# Patient Record
Sex: Male | Born: 1945
Health system: Southern US, Community
[De-identification: ages and names within clinical notes are randomized; demographics above are authoritative.]

## PROBLEM LIST (undated history)

## (undated) DIAGNOSIS — E119 Type 2 diabetes mellitus without complications: Secondary | ICD-10-CM

## (undated) DIAGNOSIS — I10 Essential (primary) hypertension: Secondary | ICD-10-CM

## (undated) DIAGNOSIS — M1990 Unspecified osteoarthritis, unspecified site: Secondary | ICD-10-CM

## (undated) DIAGNOSIS — C801 Malignant (primary) neoplasm, unspecified: Secondary | ICD-10-CM

## (undated) HISTORY — PX: JOINT REPLACEMENT: SHX530

## (undated) HISTORY — DX: Type 2 diabetes mellitus without complications: E11.9

## (undated) HISTORY — DX: Unspecified osteoarthritis, unspecified site: M19.90

## (undated) HISTORY — PX: LIVER BIOPSY: SHX301

## (undated) HISTORY — PX: HERNIA REPAIR: SHX51

---

## 1997-09-08 ENCOUNTER — Ambulatory Visit (HOSPITAL_BASED_OUTPATIENT_CLINIC_OR_DEPARTMENT_OTHER): Admission: RE | Admit: 1997-09-08 | Discharge: 1997-09-08 | Payer: Self-pay | Admitting: Orthopedic Surgery

## 2000-07-03 ENCOUNTER — Inpatient Hospital Stay (HOSPITAL_COMMUNITY): Admission: RE | Admit: 2000-07-03 | Discharge: 2000-07-09 | Payer: Self-pay | Admitting: Orthopedic Surgery

## 2000-07-08 ENCOUNTER — Encounter: Payer: Self-pay | Admitting: Orthopedic Surgery

## 2001-06-12 ENCOUNTER — Emergency Department (HOSPITAL_COMMUNITY): Admission: EM | Admit: 2001-06-12 | Discharge: 2001-06-12 | Payer: Self-pay | Admitting: Emergency Medicine

## 2003-08-04 ENCOUNTER — Emergency Department (HOSPITAL_COMMUNITY): Admission: EM | Admit: 2003-08-04 | Discharge: 2003-08-04 | Payer: Self-pay | Admitting: Emergency Medicine

## 2003-08-05 ENCOUNTER — Ambulatory Visit (HOSPITAL_COMMUNITY): Admission: RE | Admit: 2003-08-05 | Discharge: 2003-08-05 | Payer: Self-pay | Admitting: Internal Medicine

## 2003-08-12 ENCOUNTER — Ambulatory Visit (HOSPITAL_COMMUNITY): Admission: RE | Admit: 2003-08-12 | Discharge: 2003-08-12 | Payer: Self-pay | Admitting: Internal Medicine

## 2005-05-30 ENCOUNTER — Ambulatory Visit (HOSPITAL_COMMUNITY): Admission: RE | Admit: 2005-05-30 | Discharge: 2005-05-30 | Payer: Self-pay | Admitting: Internal Medicine

## 2008-09-22 ENCOUNTER — Ambulatory Visit (HOSPITAL_COMMUNITY): Admission: RE | Admit: 2008-09-22 | Discharge: 2008-09-22 | Payer: Self-pay | Admitting: Family Medicine

## 2008-09-22 ENCOUNTER — Ambulatory Visit: Payer: Self-pay | Admitting: Vascular Surgery

## 2008-09-22 ENCOUNTER — Encounter (INDEPENDENT_AMBULATORY_CARE_PROVIDER_SITE_OTHER): Payer: Self-pay | Admitting: Family Medicine

## 2008-09-30 ENCOUNTER — Emergency Department (HOSPITAL_COMMUNITY): Admission: EM | Admit: 2008-09-30 | Discharge: 2008-09-30 | Payer: Self-pay | Admitting: Emergency Medicine

## 2008-10-02 ENCOUNTER — Inpatient Hospital Stay (HOSPITAL_COMMUNITY): Admission: EM | Admit: 2008-10-02 | Discharge: 2008-10-05 | Payer: Self-pay | Admitting: Emergency Medicine

## 2008-10-02 ENCOUNTER — Ambulatory Visit: Payer: Self-pay | Admitting: Internal Medicine

## 2008-10-04 ENCOUNTER — Encounter (INDEPENDENT_AMBULATORY_CARE_PROVIDER_SITE_OTHER): Payer: Self-pay | Admitting: Internal Medicine

## 2010-07-24 LAB — COMPREHENSIVE METABOLIC PANEL
AST: 17 U/L (ref 0–37)
Albumin: 2.9 g/dL — ABNORMAL LOW (ref 3.5–5.2)
BUN: 13 mg/dL (ref 6–23)
Calcium: 8.3 mg/dL — ABNORMAL LOW (ref 8.4–10.5)
Creatinine, Ser: 0.91 mg/dL (ref 0.4–1.5)
GFR calc Af Amer: >60 mL/min (ref >60)
GFR calc non Af Amer: >60 mL/min (ref >60)
Total Bilirubin: 0.5 mg/dL (ref 0.3–1.2)

## 2010-07-24 LAB — PROTIME-INR
INR: 1.1 (ref 0.00–1.49)
Prothrombin Time: 14.2 seconds (ref 11.6–15.2)

## 2010-07-24 LAB — POCT CARDIAC MARKERS: Troponin i, poc: <0.05 ng/mL (ref 0.00–0.09)

## 2010-07-24 LAB — CBC
HCT: 39.3 % (ref 39.0–52.0)
Hemoglobin: 13.2 g/dL (ref 13.0–17.0)
Hemoglobin: 16.3 g/dL (ref 13.0–17.0)
MCHC: 34.7 g/dL (ref 30.0–36.0)
MCV: 93.8 fL (ref 78.0–100.0)
Platelets: 158 10*3/uL (ref 150–400)
Platelets: 161 10*3/uL (ref 150–400)
RBC: 4 MIL/uL — ABNORMAL LOW (ref 4.22–5.81)
RDW: 12.8 % (ref 11.5–15.5)
WBC: 10.9 10*3/uL — ABNORMAL HIGH (ref 4.0–10.5)
WBC: 7.6 10*3/uL (ref 4.0–10.5)

## 2010-07-24 LAB — DIFFERENTIAL
Basophils Absolute: 0 10*3/uL (ref 0.0–0.1)
Lymphocytes Relative: 6 % — ABNORMAL LOW (ref 12–46)
Lymphs Abs: 0.6 10*3/uL — ABNORMAL LOW (ref 0.7–4.0)
Neutro Abs: 10 10*3/uL — ABNORMAL HIGH (ref 1.7–7.7)

## 2010-07-24 LAB — TROPONIN I
Troponin I: 0.01 ng/mL (ref 0.00–0.06)
Troponin I: 0.02 ng/mL (ref 0.00–0.06)

## 2010-07-24 LAB — GLUCOSE, CAPILLARY
Glucose-Capillary: 121 mg/dL — ABNORMAL HIGH (ref 70–99)
Glucose-Capillary: 126 mg/dL — ABNORMAL HIGH (ref 70–99)
Glucose-Capillary: 141 mg/dL — ABNORMAL HIGH (ref 70–99)
Glucose-Capillary: 149 mg/dL — ABNORMAL HIGH (ref 70–99)
Glucose-Capillary: 166 mg/dL — ABNORMAL HIGH (ref 70–99)
Glucose-Capillary: 177 mg/dL — ABNORMAL HIGH (ref 70–99)
Glucose-Capillary: 233 mg/dL — ABNORMAL HIGH (ref 70–99)

## 2010-07-24 LAB — BASIC METABOLIC PANEL
CO2: 30 mEq/L (ref 19–32)
Calcium: 8.5 mg/dL (ref 8.4–10.5)
Calcium: 8.6 mg/dL (ref 8.4–10.5)
Chloride: 104 mEq/L (ref 96–112)
Creatinine, Ser: 1.17 mg/dL (ref 0.4–1.5)
GFR calc Af Amer: >60 mL/min (ref >60)
GFR calc Af Amer: >60 mL/min (ref >60)
GFR calc non Af Amer: >60 mL/min (ref >60)
Glucose, Bld: 259 mg/dL — ABNORMAL HIGH (ref 70–99)
Potassium: 4.3 mEq/L (ref 3.5–5.1)
Sodium: 138 mEq/L (ref 135–145)
Sodium: 138 mEq/L (ref 135–145)

## 2010-07-24 LAB — CARDIAC PANEL(CRET KIN+CKTOT+MB+TROPI)
CK, MB: 2.5 ng/mL (ref 0.3–4.0)
Relative Index: 0.6 (ref 0.0–2.5)
Total CK: 459 U/L — ABNORMAL HIGH (ref 7–232)
Total CK: 485 U/L — ABNORMAL HIGH (ref 7–232)
Troponin I: 0.02 ng/mL (ref 0.00–0.06)
Troponin I: 0.04 ng/mL (ref 0.00–0.06)

## 2010-07-24 LAB — CK TOTAL AND CKMB (NOT AT ARMC)
CK, MB: 1.7 ng/mL (ref 0.3–4.0)
CK, MB: 2.3 ng/mL (ref 0.3–4.0)
Relative Index: 1.2 (ref 0.0–2.5)
Total CK: 229 U/L (ref 7–232)
Total CK: 243 U/L — ABNORMAL HIGH (ref 7–232)

## 2010-07-24 LAB — POCT I-STAT, CHEM 8
Chloride: 102 mEq/L (ref 96–112)
HCT: 46 % (ref 39.0–52.0)
Hemoglobin: 15.6 g/dL (ref 13.0–17.0)
Potassium: 4.4 mEq/L (ref 3.5–5.1)
Sodium: 134 mEq/L — ABNORMAL LOW (ref 135–145)

## 2010-07-24 LAB — HEMOGLOBIN A1C: Hgb A1c MFr Bld: 5.8 % (ref 4.6–6.1)

## 2010-08-29 NOTE — Discharge Summary (Signed)
NAME:  Robert Compton, Robert Compton              ACCOUNT NO.:  [id number]   MEDICAL RECORD NO.:  [id number]          PATIENT TYPE:  INP   LOCATION:  1437                         FACILITY:  [facility]   PHYSICIAN:  Isidor Holts, M.D.  DATE OF BIRTH:  08-08-1945   DATE OF ADMISSION:  10/02/2008  DATE OF DISCHARGE:  10/05/2008                               DISCHARGE SUMMARY   PRIMARY CARE PHYSICIAN:  Dr. Felipa Eth.   DISCHARGE DIAGNOSIS:  1. Anaphylactic reaction, secondary to Augmentin.  2. Status post recent dog bite.  3. Hypertension.  4. Type 2 diabetes mellitus.  5. Smoking history.  6. New onset atrial fibrillation.   DISCHARGE MEDICATIONS:  1. Aspirin 81 mg p.o. daily.  2. Altace 5 mg p.o. daily.  3. Metformin 1000 mg p.o. b.i.d.  4. Fish oil 1000 mg p.o. b.i.d.  5. Claritin 10 mg p.o. daily for one week only.  6. Pepcid 20 mg p.o. b.i.d. for one week only.  7. Prednisone 30 mg p.o. daily for two days, then 20 mg p.o. daily for      two days, then 10 mg p.o. daily for two days, then stop.  8. Coumadin per INR, started on 5 mg p.o. at 6 p.m. daily.   Note:  The patient has stated that he does not want to take Coumadin.  Prescription however, was supplied, in case he changes his mind.   PROCEDURES:  2-D echocardiogram done October 04, 2008 that shows normal  left ventricular cavity size, normal systolic function, ejection  fraction 50% to 55%.  The valves were normal.  Images were inadequate  for left ventricular wall motion assessment.   CONSULTATIONS:  Dr. Kristeen Miss, cardiologist.   ADMISSION HISTORY:  As in H and P notes of October 02, 2008, dictated by  Dr. Joylene John.  However, in brief, this is a 65 year old male, with  known history of type 2 diabetes mellitus, smoking history, and  hypertension, who presented with shortness of breath and hives.  Apparently, he had recently sustained a dog bite and was commenced on  Augmentin.  After taking about four to five days of this  medication, he  developed pruritus and hives which were generalized, associated with  difficulty breathing and swelling in the back of the throat.  He  presented to the emergency department in [facility], was  treated with Solu-Medrol and Benadryl and then discharged with  improvement.  However, symptoms recurred in the a.m. of October 02, 2008  and the patient was referred by his primary physician to the emergency  department.  He was subsequently admitted on the critical care medicine  service, and commenced on intravenous steroids, intravenous H2 receptor  antagonist, and antihistaminics. Following clinical stability, he was  transferred to the medical service on October 03, 2008.   CLINICAL COURSE:  1. Anaphylactic reaction to Augmentin.  For details of presentation,      refer to admission history above.  The patient did have generalized      pruritic/urticarial rash and had lower lip angioedema.  He had no      respiratory  compromise during the course of his hospitalization,      and did respond to parenteral steroids, antihistaminics, H2 RA, as      well as intravenous fluid hydration.  By October 04, 2008, we were      able to transition him to oral steroids, antihistaminics, and H2      receptor antagonist, and by October 05, 2008, the patient was      completely asymptomatic and very keen to be discharged.  He is      recommended to complete another week of these medications.   1. Hypertension.  This remained controlled during the course of the      patient's hospitalization.  His ACE inhibitor was held secondary to      borderline low blood pressure at the time of presentation, however,      as of October 05, 2008, blood pressure was 122/72.  The patient will      continue his ACE inhibitor following discharge.   1. Type 2 diabetes mellitus.  This remained controlled throughout the      course of the patient's hospitalization.  He continued on his  pre-      admission oral  hypoglycemics.   1. Smoking history.  The patient has been counseled appropriately.   1. Atrial fibrillation.  This is a new diagnosis.  The patient was      initially noted to have sinus bradycardia in the 30s and 40s on      October 04, 2008, on telemetry.  He subsequently reverted to rate      controlled atrial fibrillation.  He does not have any previous      cardiac history.  A 2-D echocardiogram was done.  For details of      findings, refer to procedure list above.  This was done on October 04, 2008, and was essentially a normal 2-D echocardiogram.  Cardiac      enzymes were cycled and remained unelevated.  The patient had no      symptoms of chest pain.  TSH was normal at 0.667.  Cardiology      consultation was kindly provided by Dr. Kristeen Miss.  For details      of that consultation, refer to consultation notes of October 04, 2008.      He has recommended anticoagulation and anticipates monitoring this      in his office, on an outpatient basis.   DISPOSITION:  The patient was asymptomatic and considered clinically  stable for discharge on October 05, 2008.  He has been discharged  accordingly.  As noted above, the patient has declined anticoagulation.  We have, however, presented him with a prescription which was supplied  by Dr. Kristeen Miss.  He retains the option of commencing this  medication, should he change his mind.  The patient was discharged on  October 05, 2008.   DIET:  Carbohydrate modified/heart healthy.   ACTIVITY:  As tolerated.   FOLLOW-UP INSTRUCTIONS:  The patient is to follow up with Dr. Kristeen Miss in the a.m. of October 08, 2008, telephone number [phone].  He has  been recommended to call for an appointment.  In addition, he is to  continue to follow up with his primary M.D., Dr. Felipa Eth per prior  scheduled appointment.      Isidor Holts, M.D.  Electronically Signed     CO/MEDQ  D:  10/05/2008  T:  10/05/2008  Job:  [id number]   cc:   Larina Earthly,  M.D.  Fax: [phone]   Vesta Mixer, M.D.  Fax: [phone]

## 2010-08-29 NOTE — H&P (Signed)
NAME:  Robert Compton, Robert Compton              ACCOUNT NO.:  [id number]   MEDICAL RECORD NO.:  [id number]          PATIENT TYPE:  INP   LOCATION:  1437                         FACILITY:  [facility]   PHYSICIAN:  Joylene John, MD       DATE OF BIRTH:  February 01, 1946   DATE OF ADMISSION:  10/02/2008  DATE OF DISCHARGE:                              HISTORY & PHYSICAL   REASON FOR ADMISSION:  Shortness of breath; hives, an allergic reaction.   HISTORY OF PRESENT ILLNESS:  This is a 65 year old white male coming in  with an allergic reaction.  The patient had a dog bite on Tuesday for  which he was given Augmentin on Tuesday night.  He took about 4-5 doses  of the medication.  He has not had any problems in the past when he has  received Augmentin.  He started having itching and hives all over his  body, difficulty breathing, swelling of back part of the throat on  Thursday for which he presented to the ER.  The patient was treated with  Solu-Medrol and Benadryl in the [facility] ER and then discharged when  found to be stable.  The patient presented again with similar symptoms  to his PCP this morning where he was given Solu-Medrol and Benadryl  following which he was sent to the ER.  In the ER, the patient was found  to be hypotensive with the systolic in the 80s, was given some IV fluids  with response and systolic improving about 100-110.  The patient tells  me now he feels a little bit better, but not back to his baseline.  His  shortness of breath is little better too.  His hives are getting better;  however, he is complaining about itching.  The patient denies any  nausea, vomiting, fevers, chills, or dizziness.   PAST MEDICAL HISTORY:  Significant for high blood pressure and diabetes.   SOCIAL HISTORY:  He is an active smoker for last several decades, 2-5  cigarettes per day.  He is a social drinker.  No drugs.  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  He lives at home by himself, has  2 dogs, has a daughter and a son.   FAMILY HISTORY:  Diabetes.   ALLERGIES:  Now has allergy to AUGMENTIN.  In the past, had no allergic  reaction to any of the medications.   REVIEW OF SYSTEMS:  Please refer to the HPI, otherwise 14-point review  of systems is negative.   OBJECTIVE:  VITAL SIGNS:  Temperature of 98.4, pulse is 96, blood  pressure originally was 183/36, now is 107/82, respirations 18, the  patient is sating 100% on room air.  EKG is normal sinus rhythm.  GENERAL:  The patient is in no acute distress, awake and alert.  HEENT:  No lip or tongue swelling noted.  There is erythema around the  neck.  CARDIAC:  The patient is tachycardic on exam.  Regular rate and rhythm.  Bilateral expiratory scattered wheezes heard on lung exam.  EXTREMITIES:  The patient has hives on his torso.  Lowe extremities, no  edema appreciated.  SKIN:  He does have chronic skin changes.   PERTINENT LABORATORY WORK:  His white count is 10.9, hemoglobin is 16.3,  crit is 46.4, platelets are 161.  Sodium 134, potassium 4.4, chloride  102, bicarb is 23, BUN 16, creatinine 0.9, glucose 138.  The patient's  first set of cardiac enzymes are negative.   ASSESSMENT AND PLAN:  Plan is to admit the patient to a tele bed to  continue his IV fluids at 125 mL an hour for about 24 hours.  Will need  to reassess the need to be continue ivf beyond 24 hours.  We will hold  his Altace since his blood pressure is low.  We will also hold his  metformin.  We will put him on sliding scale instead.  We will keep an  EpiPen by bedside in case there is an emergent need.  We will give him  nebulizers for his wheezing and shortness of breath.  We will give him  Solu-Medrol 60 mg IV q.24 h. for another 48 hours following which MD  available will be required to assess if needs to be continued.  We will  give him a dose of Benadryl for his itching and then  we will continue it  on a p.r.n. basis as needed.      Joylene John, MD  Electronically Signed     RP/MEDQ  D:  10/02/2008  T:  10/03/2008  Job:  [id number]

## 2010-08-29 NOTE — Consult Note (Signed)
NAME:  Robert Compton, Robert Compton NO.:  [id number]   MEDICAL RECORD NO.:  [id number]          PATIENT TYPE:  INP   LOCATION:  1437                         FACILITY:  [facility]   PHYSICIAN:  Vesta Mixer, M.D. DATE OF BIRTH:  1945/10/30   DATE OF CONSULTATION:  10/04/2008  DATE OF DISCHARGE:                                 CONSULTATION   Robert Compton is a 65 year old gentleman with a history of diabetes  mellitus and hypertension.  He is admitted to the hospital after having  an allergic reaction to some Augmentin which he had received for a dog  bite.  The patient was incidentally found to have atrial fibrillation,  and we were asked to see him for further evaluation.   The patient has a long history of diabetes mellitus.  It is been fairly  well controlled.   The patient denies any episodes of chest pain, shortness of breath,  syncope or presyncope.  He denies any PND or orthopnea.  He exercises on  a fairly regular basis and works out on his Manufacturing engineer for 15-20  minutes at night.  He is a long distance truck driver, so he does not  exercise perhaps quite as regularly as he would want.  His diet is also  composed mainly of fast foods.   The patient cannot even tell that his heart rate is irregular.  He has  not noticed any recent decrease in his function.  He cannot even really  tell that his heart rate is irregular.   CURRENT MEDICATIONS:  1. Aspirin 81 mg a day.  2. Altace 5 mg a day.  3. Metformin 1000 mg twice a day.  4. Fish oil twice a day.  5. Cinnamon once a day.  6. Red Yeast Rice twice a day.   ALLERGIES:  HE IS ALLERGIC TO PENICILLINS.   SOCIAL HISTORY:  The patient smokes one-and-a-half packs of cigarettes a  day.  He is a Naval architect.  His wife died several years ago.   FAMILY HISTORY:  Positive for diabetes mellitus.   REVIEW OF SYSTEMS:  As reviewed in the HPI.  Otherwise, his 14-point  review of systems is negative.   PHYSICAL  EXAMINATION:  GENERAL:  He is a middle-aged gentleman in no  acute distress.  He is alert and oriented x3, and his mood and affect  are normal.  VITAL SIGNS:  His temperature is 98.2.  His heart rate 81 and irregular.  His blood pressure is 125/73.  His O2 saturation is 95% on room air.  HEENT:  His sclerae are nonicteric.  His mucous membranes are moist.  His carotids are 2+ without bruits.  There is no JVD.  There is no  thyromegaly.  LUNGS:  Clear.  BACK:  Nontender.  CHEST/BACK:  His chest and back were covered with an erythematous rash.  HEART:  Irregularly irregular.  He has a soft systolic murmur.  The PMI  is nondisplaced.  ABDOMEN:  Good bowel sounds and is nontender.  There is no  hepatosplenomegaly.  There is no guarding or rebound.  EXTREMITIES:  He has no clubbing, cyanosis or edema.  He does have a  rash as described above.  There are no palpable cords.  He has chronic  stasis changes, although I could not detect any real significant edema.  NEUROLOGIC:  Nonfocal.  His gait was not able to be assessed.  GU:  Exam was deferred.   His EKG reveals atrial fibrillation with a controlled ventricular  response.  His telemetry monitor reveals several pauses up to a second-  and-a-half to two seconds.   LABORATORY DATA:  CPK MBs are normal.  His TSH is 0.667.  His white  blood cell count is 10.9, hemoglobin is 16.3, hematocrit 46.4.  Sodium  is 134, potassium is 4.4, chloride is 102, glucose is 138, BUN 16,  creatinine 0.9.   His echocardiogram reveals normal left ventricular systolic function  with an ejection fraction of 50-55%.  There are no significant valvular  abnormalities.  His left atrial size is normal.   [name] presents with relatively asymptomatic atrial fibrillation.  He does have diabetes mellitus, so it gives him a risk factor for  thromboembolus.  I would favor starting him on Coumadin.  Will start him  on 5 mg a day.  I will give him a 10 mg load  tonight.  We will see him  back in the office on Friday for a pro time and INR.  Will plan on  seeing him in the office in week or so after that.  He will follow up  with his medical doctor for further management regarding his rash and  the reaction to Augmentin.  All his other medical problems remained  stable.      Vesta Mixer, M.D.  Electronically Signed     PJN/MEDQ  D:  10/04/2008  T:  10/05/2008  Job:  [id number]   cc:   Larina Earthly, M.D.  Fax: [phone]

## 2010-09-01 NOTE — Discharge Summary (Signed)
. [facility]  Patient:    Robert Compton, Robert Compton                       MRN: [id number] Adm. Date:  [id number] Disc. Date: [id number] Attending:  Milly Jakob Dictator:   Currie Paris. Thedore Mins. CC:         Ravi R. Felipa Eth, M.D.   Discharge Summary  ADMISSION DIAGNOSES: 1. Degenerative joint disease right knee. 2. Hypertension. 3. Diabetes, controlled on diet.  DISCHARGE DIAGNOSES: 1. Degenerative joint disease right knee. 2. Hypertension. 3. Diabetes, controlled on diet.  PROCEDURES:  Right total knee arthroplasty, Milly Jakob, M.D., on July 03, 2000.  HISTORY OF PRESENT ILLNESS:  Robert Compton is a 65 year old male who has a long history of pain in his right knee.  He had an injury at work and has subsequently had a right knee arthroscopy which showed degenerative changes. He continued to have pain in the right knee.  Standing x-rays of the right knee showed DJD, essentially bone-on-bone, with night pain and pain on ambulation.  He got temporary relief with the arthroscopy and conservative management including cortisone injections and use of anti-inflammatory medications.  Since he did not have overall improvement with exhaustive conservative treatment, he was felt to be a candidate for a right total knee arthroplasty and is admitted for this.  LABORATORY DATA:  Venous Doppler evaluation showed no evidence of DVT, SVT, or Bakers cyst bilaterally.  Hematocrit on admission was 49.7.  Hemoglobin on postoperative day #1 was 14.2, hematocrit 41.1.  On postoperative day #2 hemoglobin was 12.3.  On postoperative day #3 hemoglobin was 11.3.  Protime on admission was 12.5 seconds and INR 1.0, PTT was 26.  On the date of admission on Coumadin antithrombolytic therapy the patients protime was 17.6 seconds with an INR of 1.7.  CMET on admission was within normal limits.  He did have an elevated glucose on his BMET on postoperative day #1 at 137 and  decreased calcium at 8.3.  Sodium was 134.  Urinalysis on admission was unremarkable.  HOSPITAL COURSE:  The patient underwent right total knee arthroplasty as well described in Dr. Luiz Blare operative note on July 03, 2000.  Postoperatively the patient was placed on Ancef 1 g IV q.8h. x 48 hours, put on a PCA morphine pump for pain control, and physical therapy was ordered for walker ambulation and weightbearing as tolerated.  A CPM machine was used for range of motion of the right knee.  On postoperative day #1 the patient was without complaints. His vital signs were stable, he was afebrile.  Lungs were clear to auscultation.  His right leg exam was unremarkable.  His hemoglobin was 14.2. He was continued on CPM.  He was gotten up with physical therapy, and anticoagulation was continued.  He was weaned off the oxygen.  On postoperative day #2 he had some knee pain.  He was using PCA morphine pump. He was voiding and taking fluids without difficulty.  He was up out of bed to the chair.  Vital signs were stable, and he was afebrile.  His hemoglobin was 12.3, his INR was 1.4.  His dressing was changed, and his Hemovac drain was pulled.  His IV and PCA morphine pump were continued for an additional day. Dr. ______  for Robert Compton, P.A.C. covered over the weekend.  He continued to progress without complications but did complain of pain.  He was kept on the PCA  morphine pump until postoperative day #4.  He got poor results with attempts to control pain with pills and was having significant pain with use of the CPM.  A venous Doppler was ordered, and x-ray was ordered of the right knee to rule out any problems with the prosthesis.  The x-rays were unremarkable, and the venous Doppler showed no sign of DVT.  On postoperative day #6 he was discharged home.  He did have complaints of pain in the right knee, but his wound was benign.  His vital signs were stable.  He did have a temperature up to  100.1.  His protime was 17.68 with an INR of 1.7.  Again, his wound was entirely benign, without sign of infection.  DISCHARGE MEDICATIONS:   He was given a prescription for: 1. OxyContin 40 mg, #40 tablets, 1 b.i.d. p.r.n. for pain. 2. Tylox 1 q.6h. p.r.n. for breakthrough pain. 3. Coumadin antithrombolytic therapy per pharmacy protocol x 6 weeks    postoperatively for DVT prophylaxis.  ACTIVITY:  Weightbearing as tolerated on the right with a walker.  Home health physical therapy was ordered for walker ambulation and also range of motion of the right knee.  FOLLOW-UP:  He will follow up with Dr. Luiz Blare in the office in 10 days, sooner if any problems occur. DD:  08/07/00 TD:  08/09/00 Job: [id number]

## 2010-09-01 NOTE — Op Note (Signed)
[facility]  Patient:    Robert Compton, Robert Compton                       MRN: [id number] Proc. Date: 07/03/00 Adm. Date:  [id number] Attending:  Milly Jakob                           Operative Report  PREOPERATIVE DIAGNOSIS:  Degenerative joint disease, right knee.  POSTOPERATIVE DIAGNOSIS:  Degenerative joint disease, right knee.  PROCEDURE:  Right total knee replacement with DePuy LCS instrumentation, large plus femur, size 6 cemented tibial tray, a 10 mm large plus bridging bearing, and a large plus cemented patella.  SURGEON:  Harvie Junior, M.D.  ASSISTANTS:  Alinda Deem, M.D., and Currie Paris. Thedore Mins.  ANESTHESIA:  General.  BRIEF HISTORY:  He is a 65 year old male with a long history of having had injury to his knee at work.  He ultimately was followed along conservatively and went to arthroscopy.  At that point, he was noted to have fairly significant degenerative joint disease of the knee with some exposed bone.  He had undergone debridement techniques and had done okay, although he was never really able to go back to the hard kind of work that he did.  He had seen [place] intermittently for steroid injection, and these ultimately had helped quite a bit, although they became shorter and shorter-acting over time.  We tried other conservative measures, but he ultimately failed these and because of continued complaints of pain and failure of all conservative care, he was brought to the operating room for a total knee replacement procedure.  DESCRIPTION OF PROCEDURE:  Patient taken to the operating room and after adequate anesthesia was obtained with a general anesthetic, the patient was placed supine on the operating table.  The right leg was then prepped and draped in the usual sterile fashion.  Following Esmarch exsanguination of the extremity, a blood pressure tourniquet was inflated to 350 mmHg.  Following this, a midline incision  was made, subcutaneous tissue taken down to the level of the extensor mechanism, which was sharply divided to gain aspect to the medial aspect.  A medial parapatellar arthrotomy was then undertaken, and the patella was everted and folded back.  The medial and lateral meniscus and fat pad around the patellar tendon were removed.  At that point, the anterior and posterior cruciate ligaments were removed, and the proximal tibia was exposed. At that point, a midline entry into the tibial canal was undertaken, and the intramedullary guide was then advanced into the tibia.  Care was taken to resect a portion of the proximal tibia, which was appropriate for a 10 mm bridging tibial bearing.  Following this, the 10 mm rotational guide was put in place, felt to fit extremely well, and it was pinned in place after the femur had been sized to a large plus and an intramedullary pilot hole had been drilled.  With the rotation alignment set, the guide was pinned and the femur then had anterior and posterior cuts made.  There was a small amount of physiologic notching noted on the anterior cortex of the femur, and then the posterior cuts were made.  Following this, the flexion block was put in place, and a 10 mm flexion block fit easily into the knee at this point.  Attention at this point was turned toward the distal femoral cut, where a  guide was pinned and then the distal femur was cut, and the lollipop guides were put and fit well for a 10 mm poly thickness.  At this point, attention was turned toward placement of the block for the chamfer cuts.  The chamfer cuts were then made anterior-posterior in the notch for the femoral box.  At this point, the posterior condyles were then resected to allow for easy and adequate flexion.  At this point, attention was turned toward the removal of the chamfer guide, and the tibia was sized to a size 6, and this was put into place with the keel being put into place in  the center of the tibia.  The femur was then put in place, femoral component with the 10 mm guide, and the knee put through a range of motion, came easily into full extension, had no midflexion instability, and then attention was turned to the patella.  The patella was measured to a size 27.  It was cut to a 14, and the pegs were then drilled for the patella and the patellar trial was put in place.  The knee was then put through a range of motion with the trial components in place, and an excellent range of motion was achieved.  Patellar tracking was midline.  The flexion was to gravity flexion, was to about 110 degrees.  The attention was then turned to removal of the components.  The knee was then copiously irrigated with pulsatile lavage irrigation, and the components were cemented into place, large plus femur, size 6 tibial component, 12.5 mm bridging bearing, and a large plus patellar component were placed.  The cement was allowed to harden with the knee in about 5 degrees of flexion and pressure being applied, and the patellar clamp was in place.  All excess cement was removed with cement tools.  At this point, the knee was copiously irrigated with pulsatile lavage irrigation 3 L and bug juice irrigation, and medium Hemovac drains were put in place.  The medial parapatellar arthrotomy was closed #1 Vicryl running suture.  The subcutaneous tissue was then closed with 0 and 2-0 Vicryl and the skin with skin staples.  A sterile and compressive dressing was applied.  The patient was then taken to the recovery room, where he was noted to be in satisfactory condition.  Estimated blood loss for the procedure was none. DD:  07/03/00 TD:  07/04/00 Job: [id number]

## 2011-10-09 ENCOUNTER — Other Ambulatory Visit (HOSPITAL_BASED_OUTPATIENT_CLINIC_OR_DEPARTMENT_OTHER): Payer: Self-pay | Admitting: General Surgery

## 2011-10-09 ENCOUNTER — Encounter (HOSPITAL_BASED_OUTPATIENT_CLINIC_OR_DEPARTMENT_OTHER): Payer: Managed Care, Other (non HMO) | Attending: General Surgery

## 2011-10-09 ENCOUNTER — Ambulatory Visit (HOSPITAL_COMMUNITY)
Admission: RE | Admit: 2011-10-09 | Discharge: 2011-10-09 | Disposition: A | Discharge status: Home / Self Care | Payer: Managed Care, Other (non HMO) | Source: Ambulatory Visit | Attending: General Surgery | Admitting: General Surgery

## 2011-10-09 DIAGNOSIS — I10 Essential (primary) hypertension: Secondary | ICD-10-CM | POA: Insufficient documentation

## 2011-10-09 DIAGNOSIS — Y849 Medical procedure, unspecified as the cause of abnormal reaction of the patient, or of later complication, without mention of misadventure at the time of the procedure: Secondary | ICD-10-CM | POA: Insufficient documentation

## 2011-10-09 DIAGNOSIS — T8189XA Other complications of procedures, not elsewhere classified, initial encounter: Secondary | ICD-10-CM

## 2011-10-09 DIAGNOSIS — Z79899 Other long term (current) drug therapy: Secondary | ICD-10-CM | POA: Insufficient documentation

## 2011-10-09 DIAGNOSIS — M899 Disorder of bone, unspecified: Secondary | ICD-10-CM | POA: Insufficient documentation

## 2011-10-09 DIAGNOSIS — M949 Disorder of cartilage, unspecified: Secondary | ICD-10-CM | POA: Insufficient documentation

## 2011-10-09 DIAGNOSIS — L97509 Non-pressure chronic ulcer of other part of unspecified foot with unspecified severity: Secondary | ICD-10-CM | POA: Insufficient documentation

## 2011-10-09 DIAGNOSIS — E1169 Type 2 diabetes mellitus with other specified complication: Secondary | ICD-10-CM | POA: Insufficient documentation

## 2011-10-09 LAB — GLUCOSE, CAPILLARY: Glucose-Capillary: 109 mg/dL — ABNORMAL HIGH (ref 70–99)

## 2011-10-09 NOTE — Progress Notes (Signed)
[facility]  NAME:  ARTHUR, SPEAGLE NO.:  [id number]  MEDICAL RECORD NO.:  [id number]      DATE OF BIRTH:  [birth year]  PHYSICIAN:  Ardath Sax, M.D.           VISIT DATE:                                  OFFICE VISIT   He is a 66 year old type 2 diabetic, who enters here because he has got superficial diabetic foot ulcers on the plantar aspect of his right toes.  They have been present for several months and they have been treating it with Medihoney without much success.  He is afebrile.  His blood pressure is 120/75.  His blood sugars are 109, his temperature is 98.6.  He is alert, cooperative, is here with his daughter.  Today, I debrided necrotic skin off of the superficial ulcers.  I really do not think it goes quite through the dermis although it is close.  We are treating them with silver alginate every other day and he will return in a week.  He also has a history of hypertension and he takes Altace and aspirin along with vitamins and he also takes fish oil and he has good pulses.  So I do not think revascularization is going to need to be done at this point.  He will use the silver alginate and come back in a week.     Ardath Sax, M.D.     PP/MEDQ  D:  10/09/2011  T:  10/09/2011  Job:  [id number]

## 2011-10-16 ENCOUNTER — Encounter (HOSPITAL_BASED_OUTPATIENT_CLINIC_OR_DEPARTMENT_OTHER): Payer: Managed Care, Other (non HMO) | Attending: General Surgery

## 2011-10-16 ENCOUNTER — Ambulatory Visit (INDEPENDENT_AMBULATORY_CARE_PROVIDER_SITE_OTHER): Payer: Managed Care, Other (non HMO) | Admitting: Vascular Surgery

## 2011-10-16 DIAGNOSIS — I10 Essential (primary) hypertension: Secondary | ICD-10-CM | POA: Insufficient documentation

## 2011-10-16 DIAGNOSIS — L97509 Non-pressure chronic ulcer of other part of unspecified foot with unspecified severity: Secondary | ICD-10-CM | POA: Insufficient documentation

## 2011-10-16 DIAGNOSIS — L97909 Non-pressure chronic ulcer of unspecified part of unspecified lower leg with unspecified severity: Secondary | ICD-10-CM

## 2011-10-16 DIAGNOSIS — E1169 Type 2 diabetes mellitus with other specified complication: Secondary | ICD-10-CM | POA: Insufficient documentation

## 2011-10-16 DIAGNOSIS — Z79899 Other long term (current) drug therapy: Secondary | ICD-10-CM | POA: Insufficient documentation

## 2011-11-06 ENCOUNTER — Encounter (HOSPITAL_BASED_OUTPATIENT_CLINIC_OR_DEPARTMENT_OTHER): Payer: Managed Care, Other (non HMO)

## 2012-06-07 ENCOUNTER — Ambulatory Visit (INDEPENDENT_AMBULATORY_CARE_PROVIDER_SITE_OTHER): Payer: Managed Care, Other (non HMO) | Admitting: Emergency Medicine

## 2012-06-07 VITALS — BP 131/89 | HR 87 | Temp 97.5°F | Resp 16 | Ht 75.0 in | Wt 321.0 lb

## 2012-06-07 DIAGNOSIS — E119 Type 2 diabetes mellitus without complications: Secondary | ICD-10-CM

## 2012-06-07 DIAGNOSIS — Z9119 Patient's noncompliance with other medical treatment and regimen: Secondary | ICD-10-CM

## 2012-06-07 DIAGNOSIS — L02419 Cutaneous abscess of limb, unspecified: Secondary | ICD-10-CM

## 2012-06-07 LAB — POCT CBC
Granulocyte percent: 72 %G (ref 37–80)
HCT, POC: 50 % (ref 43.5–53.7)
Hemoglobin: 16.5 g/dL (ref 14.1–18.1)
MCHC: 33 g/dL (ref 31.8–35.4)
MPV: 7.6 fL (ref 0–99.8)
POC Granulocyte: 5.9 (ref 2–6.9)
POC MID %: 8.2 %M (ref 0–12)
RBC: 5.3 M/uL (ref 4.69–6.13)

## 2012-06-07 LAB — GLUCOSE, POCT (MANUAL RESULT ENTRY): POC Glucose: 92 mg/dl (ref 70–99)

## 2012-06-07 MED ORDER — CIPROFLOXACIN HCL 500 MG PO TABS: 500.0000 mg | ORAL_TABLET | Freq: Two times a day (BID) | ORAL | Status: DC

## 2012-06-07 MED ORDER — CLINDAMYCIN HCL 300 MG PO CAPS: 300.0000 mg | ORAL_CAPSULE | Freq: Three times a day (TID) | ORAL | Status: DC

## 2012-06-07 NOTE — Patient Instructions (Signed)
Cellulitis  Cellulitis is an infection of the skin and the tissue beneath it. The infected area is usually red and tender. Cellulitis occurs most often in the arms and lower legs.   CAUSES   Cellulitis is caused by bacteria that enter the skin through cracks or cuts in the skin. The most common types of bacteria that cause cellulitis are Staphylococcus and Streptococcus.  SYMPTOMS    Redness and warmth.   Swelling.   Tenderness or pain.   Fever.  DIAGNOSIS   Your caregiver can usually determine what is wrong based on a physical exam. Blood tests may also be done.  TREATMENT   Treatment usually involves taking an antibiotic medicine.  HOME CARE INSTRUCTIONS    Take your antibiotics as directed. Finish them even if you start to feel better.   Keep the infected arm or leg elevated to reduce swelling.   Apply a warm cloth to the affected area up to 4 times per day to relieve pain.   Only take over-the-counter or prescription medicines for pain, discomfort, or fever as directed by your caregiver.   Keep all follow-up appointments as directed by your caregiver.  SEEK MEDICAL CARE IF:    You notice red streaks coming from the infected area.   Your red area gets larger or turns dark in color.   Your bone or joint underneath the infected area becomes painful after the skin has healed.   Your infection returns in the same area or another area.   You notice a swollen bump in the infected area.   You develop new symptoms.  SEEK IMMEDIATE MEDICAL CARE IF:    You have a fever.   You feel very sleepy.   You develop vomiting or diarrhea.   You have a general ill feeling (malaise) with muscle aches and pains.  MAKE SURE YOU:    Understand these instructions.   Will watch your condition.   Will get help right away if you are not doing well or get worse.  Document Released: 01/10/2005 Document Revised: 10/02/2011 Document Reviewed: 06/18/2011  ExitCare Patient Information 2013 [employer].        Diabetes,  Type 2  Diabetes is a long-lasting (chronic) disease. In type 2 diabetes, the pancreas does not make enough insulin (a hormone), and the body does not respond normally to the insulin that is made. This type of diabetes was also previously called adult-onset diabetes. It usually occurs after the age of 40, but it can occur at any age.   CAUSES   Type 2 diabetes happens because the pancreasis not making enough insulin or your body has trouble using the insulin that your pancreas does make properly.  SYMPTOMS    Drinking more than usual.   Urinating more than usual.   Blurred vision.   Dry, itchy skin.   Frequent infections.   Feeling more tired than usual (fatigue).  DIAGNOSIS  The diagnosis of type 2 diabetes is usually made by one of the following tests:   Fasting blood glucose test. You will not eat for at least 8 hours and then take a blood test.   Random blood glucose test. Your blood glucose (sugar) is checked at any time of the day regardless of when you ate.   Oral glucose tolerance test (OGTT). Your blood glucose is measured after you have not eaten (fasted) and then after you drink a glucose containing beverage.  TREATMENT    Healthy eating.   Exercise.     Medicine, if needed.   Monitoring blood glucose.   Seeing your caregiver regularly.  HOME CARE INSTRUCTIONS    Check your blood glucose at least once a day. More frequent monitoring may be necessary, depending on your medicines and on how well your diabetes is controlled. Your caregiver will advise you.   Take your medicine as directed by your caregiver.   Do not smoke.   Make wise food choices. Ask your caregiver for information. Weight loss can improve your diabetes.   Learn about low blood glucose (hypoglycemia) and how to treat it.   Get your eyes checked regularly.   Have a yearly physical exam. Have your blood pressure checked and your blood and urine tested.   Wear a pendant or bracelet saying that you have diabetes.   Check  your feet every night for cuts, sores, blisters, and redness. Let your caregiver know if you have any problems.  SEEK MEDICAL CARE IF:    You have problems keeping your blood glucose in target range.   You have problems with your medicines.   You have symptoms of an illness that do not improve after 24 hours.   You have a sore or wound that is not healing.   You notice a change in vision or a new problem with your vision.   You have a fever.  MAKE SURE YOU:   Understand these instructions.   Will watch your condition.   Will get help right away if you are not doing well or get worse.  Document Released: 04/02/2005 Document Revised: 06/25/2011 Document Reviewed: 09/18/2010  [employer] Patient Information 2013 [employer].

## 2012-06-07 NOTE — Progress Notes (Signed)
[facility] [address], [place] [address] [phone]  Date:  06/07/2012   Name:  Normand Damron   DOB:  07/27/1945   MRN:  [id number]  PCP:  Hoyle Sauer, MD    Chief Complaint: Cellulitis   History of Present Illness:  Vaughan Garfinkle is a 67 y.o. very pleasant male patient who presents with the following:  NIDDM with swelling in legs over the past week and over the few days has weeping and cellulitis lower legs.  No fever or chills. Has paresthesias left calf and foot.  Smokes "like a IT sales professional".  Doesn't check his sugar for a "long time".  There is no problem list on file for this patient.   Past Medical History  Diagnosis Date  . Arthritis   . Diabetes mellitus without complication     Past Surgical History  Procedure Laterality Date  . Joint replacement Right     knee    History  Substance Use Topics  . Smoking status: Current Every Day Smoker -- 1.50 packs/day for 8 years    Types: Cigarettes  . Smokeless tobacco: Not on file  . Alcohol Use: No    History reviewed. No pertinent family history.  Allergies  Allergen Reactions  . Amoxicillin-Pot Clavulanate Anaphylaxis    Medication list has been reviewed and updated.  No current outpatient prescriptions on file prior to visit.   No current facility-administered medications on file prior to visit.    Review of Systems:  As per HPI, otherwise negative.    Physical Examination: Filed Vitals:   06/07/12 1719  BP: 131/89  Pulse: 87  Temp: 97.5 F (36.4 C)  Resp: 16   Filed Vitals:   06/07/12 1719  Height: 6\' 3"  (1.905 m)  Weight: 321 lb (145.605 kg)   Body mass index is 40.12 kg/(m^2). Ideal Body Weight: Weight in (lb) to have BMI = 25: 199.6  GEN: WDWN, NAD, Non-toxic, A & O x 3 HEENT: Atraumatic, Normocephalic. Neck supple. No masses, No LAD. Ears and Nose: No external deformity. CV: RRR, No M/G/R. No JVD. No thrill. No extra heart sounds. PULM: CTA  B, no wheezes, crackles, rhonchi. No retractions. No resp. distress. No accessory muscle use. ABD: S, NT, ND, +BS. No rebound. No HSM. EXTR: No c/c.  Marked edema lower legs.  Has cellulitis both lower legs. NEURO Normal gait.  PSYCH: Normally interactive. Conversant. Not depressed or anxious appearing.  Calm demeanor.    Assessment and Plan: Cellulitis legs Dependent edema Tobacco abuse Noncompliance NIDDM  cipro Clindamycin Off legs and elevate Follow up Monday  STOP SMOKING Get diabetes in control  Carmelina Dane, MD  Results for orders placed in visit on 06/07/12  POCT CBC      Result Value Range   WBC 8.2  4.6 - 10.2 K/uL   Lymph, poc 1.6  0.6 - 3.4   POC LYMPH PERCENT 19.8  10 - 50 %L   MID (cbc) 0.7  0 - 0.9   POC MID % 8.2  0 - 12 %M   POC Granulocyte 5.9  2 - 6.9   Granulocyte percent 72.0  37 - 80 %G   RBC 5.30  4.69 - 6.13 M/uL   Hemoglobin 16.5  14.1 - 18.1 g/dL   HCT, POC 65.7  84.6 - 53.7 %   MCV 94.3  80 - 97 fL   MCH, POC 31.1  27 - 31.2 pg   MCHC 33.0  31.8 - 35.4 g/dL   RDW, POC 11.9     Platelet Count, POC 224  142 - 424 K/uL   MPV 7.6  0 - 99.8 fL  GLUCOSE, POCT (MANUAL RESULT ENTRY)      Result Value Range   POC Glucose 92  70 - 99 mg/dl

## 2012-06-12 LAB — WOUND CULTURE: Gram Stain: NONE SEEN

## 2016-10-06 ENCOUNTER — Emergency Department (HOSPITAL_COMMUNITY)
Admission: EM | Admit: 2016-10-06 | Discharge: 2016-10-07 | Disposition: A | Discharge status: Home / Self Care | Payer: BLUE CROSS/BLUE SHIELD | Attending: Emergency Medicine | Admitting: Emergency Medicine

## 2016-10-06 ENCOUNTER — Encounter (HOSPITAL_COMMUNITY): Payer: Self-pay

## 2016-10-06 DIAGNOSIS — S4992XA Unspecified injury of left shoulder and upper arm, initial encounter: Secondary | ICD-10-CM | POA: Diagnosis present

## 2016-10-06 DIAGNOSIS — S46012A Strain of muscle(s) and tendon(s) of the rotator cuff of left shoulder, initial encounter: Secondary | ICD-10-CM | POA: Insufficient documentation

## 2016-10-06 DIAGNOSIS — Z79899 Other long term (current) drug therapy: Secondary | ICD-10-CM | POA: Insufficient documentation

## 2016-10-06 DIAGNOSIS — F17210 Nicotine dependence, cigarettes, uncomplicated: Secondary | ICD-10-CM | POA: Insufficient documentation

## 2016-10-06 DIAGNOSIS — Z7982 Long term (current) use of aspirin: Secondary | ICD-10-CM | POA: Diagnosis not present

## 2016-10-06 DIAGNOSIS — W009XXA Unspecified fall due to ice and snow, initial encounter: Secondary | ICD-10-CM | POA: Insufficient documentation

## 2016-10-06 DIAGNOSIS — Y929 Unspecified place or not applicable: Secondary | ICD-10-CM | POA: Insufficient documentation

## 2016-10-06 DIAGNOSIS — Y939 Activity, unspecified: Secondary | ICD-10-CM | POA: Diagnosis not present

## 2016-10-06 DIAGNOSIS — E119 Type 2 diabetes mellitus without complications: Secondary | ICD-10-CM | POA: Diagnosis not present

## 2016-10-06 DIAGNOSIS — M75102 Unspecified rotator cuff tear or rupture of left shoulder, not specified as traumatic: Secondary | ICD-10-CM

## 2016-10-06 DIAGNOSIS — Y999 Unspecified external cause status: Secondary | ICD-10-CM | POA: Diagnosis not present

## 2016-10-06 DIAGNOSIS — M25512 Pain in left shoulder: Secondary | ICD-10-CM

## 2016-10-06 DIAGNOSIS — Z7984 Long term (current) use of oral hypoglycemic drugs: Secondary | ICD-10-CM | POA: Insufficient documentation

## 2016-10-06 NOTE — ED Notes (Signed)
Patient transported to X-ray 

## 2016-10-06 NOTE — ED Triage Notes (Signed)
Pt complaining of L shoulder pain. Pt states fell onto L shoulder few months ago. Pt complaining of recurrent pain. Pt with some decreased ROM. Pt states increased pain with motion. Pt denies any chest pain or SOB.

## 2016-10-07 ENCOUNTER — Emergency Department (HOSPITAL_COMMUNITY): Payer: BLUE CROSS/BLUE SHIELD

## 2016-10-07 MED ORDER — ACETAMINOPHEN 325 MG PO TABS
650.0000 mg | ORAL_TABLET | Freq: Once | ORAL | Status: AC
  Administered 2016-10-07: 650 mg via ORAL
  Filled 2016-10-07: qty 2

## 2016-10-07 NOTE — ED Provider Notes (Signed)
[facility] DEPT Provider Note   CSN: [id number] Arrival date & time: 10/06/16  2238     History   Chief Complaint Chief Complaint  Patient presents with  . Shoulder Pain    HPI Jamale Spangler is a 71 y.o. male who presents to the ED with left shoulder pain. He reports that he fell in the snow a few month ago and landed with his hand outstretched to catch his fall. At that time he had a moderate amount of pain but he just treated it with Aleve. It has continued to hurt but over the past few days the pain has gotten so bad that the Aleve only helps for a short time. Patient denies, weakness, n/v, shortness of breath or chest pain.   HPI  Past Medical History:  Diagnosis Date  . Arthritis   . Diabetes mellitus without complication ([place])     There are no active problems to display for this patient.   Past Surgical History:  Procedure Laterality Date  . JOINT REPLACEMENT Right    knee       Home Medications    Prior to Admission medications   Medication Sig Start Date End Date Taking? Authorizing Provider  aspirin 81 MG tablet Take 81 mg by mouth 2 (two) times daily.    [provider]  Cinnamon 500 MG capsule Take 500 mg by mouth daily.    [provider]  ciprofloxacin (CIPRO) 500 MG tablet Take 1 tablet (500 mg total) by mouth 2 (two) times daily. 06/07/12   Roselee Culver, MD  clindamycin (CLEOCIN) 300 MG capsule Take 1 capsule (300 mg total) by mouth 3 (three) times daily. 06/07/12   Roselee Culver, MD  fish oil-omega-3 fatty acids 1000 MG capsule Take 1 g by mouth 2 (two) times daily.    [provider]  metFORMIN (GLUCOPHAGE) 1000 MG tablet Take 1,000 mg by mouth 2 (two) times daily with a meal.    [provider]  ramipril (ALTACE) 10 MG tablet Take 10 mg by mouth daily.    [provider]  Red Yeast Rice 600 MG TABS Take 1 tablet by mouth daily.    [provider]    Family History History  reviewed. No pertinent family history.  Social History Social History  Substance Use Topics  . Smoking status: Current Every Day Smoker    Packs/day: 1.50    Years: 8.00    Types: Cigarettes  . Smokeless tobacco: Never Used  . Alcohol use No     Allergies   Amoxicillin-pot clavulanate   Review of Systems Review of Systems  Constitutional: Negative for activity change and diaphoresis.  HENT: Negative.   Respiratory: Negative for shortness of breath.   Cardiovascular: Negative for chest pain.  Gastrointestinal: Negative for nausea and vomiting.  Musculoskeletal: Positive for arthralgias.       Left shoulder  Skin: Negative for wound.  Neurological: Negative for weakness.  Psychiatric/Behavioral: The patient is not nervous/anxious.      Physical Exam Updated Vital Signs BP 133/77 (BP Location: Right Arm)   Pulse 98   Temp 97.8 F (36.6 C) (Oral)   Resp 16   Ht 6\' 3"  (1.905 m)   Wt (!) 143.8 kg (317 lb)   SpO2 98%   BMI 39.62 kg/m   Physical Exam  Constitutional: He is oriented to person, place, and time. He appears well-developed and well-nourished. No distress.  Eyes: EOM are normal.  Neck: Normal range  of motion. Neck supple.  Cardiovascular: Normal rate.   Pulmonary/Chest: Effort normal. No respiratory distress.  Musculoskeletal:       Left shoulder: He exhibits tenderness and pain. He exhibits normal range of motion, no swelling, no effusion, no crepitus, no deformity, no laceration, normal pulse and normal strength.       Arms: Pain with palpation and range of motion of the left shoulder. Pain increases with trying to put the arm behind his back or over his head. Radial pulses 2+, adequate circulation, grips are equal.   Neurological: He is alert and oriented to person, place, and time. No cranial nerve deficit.  Skin: Skin is warm and dry.  Psychiatric: He has a normal mood and affect. His behavior is normal.  Nursing note and vitals reviewed.    ED  Treatments / Results  Labs (all labs ordered are listed, but only abnormal results are displayed) Labs Reviewed - No data to display   Radiology Dg Shoulder Left  Result Date: 10/07/2016 CLINICAL DATA:  71 year old male with fall and left shoulder pain. EXAM: LEFT SHOULDER - 2+ VIEW COMPARISON:  None. FINDINGS: There is no acute fracture or dislocation. The bones are osteopenic. There is degenerative changes and bone spurring at the left [facility] joint. The soft tissues appear unremarkable. IMPRESSION: No acute fracture or dislocation. Electronically Signed   By: Anner Crete M.D.   On: 10/07/2016 00:10    Procedures Procedures (including critical care time)  Medications Ordered in ED Medications  acetaminophen (TYLENOL) tablet 650 mg (650 mg Oral Given 10/07/16 0049)     Initial Impression / Assessment and Plan / ED Course  I have reviewed the triage vital signs and the nursing notes.  Pertinent imaging results that were available during my care of the patient were reviewed by me and considered in my medical decision making (see chart for details).   Final Clinical Impressions(s) / ED Diagnoses  71 y.o. male with left shoulder pain that has worsened since a fall during the last snow stable for d/c without fracture or dislocation noted on x-ray. Sling and ice prior to d/c with instructions regarding regular range of motion of the shoulder. Patient request to f/u with his orthopedic doctor that did his knee surgery, Dr. Berenice Primas. He will call on Monday for a follow up appointment. Patient will continue his Aleve and tylenol for pain.   Final diagnoses:  Left shoulder pain, unspecified chronicity  Rotator cuff syndrome, left    New Prescriptions New Prescriptions   No medications on file     Debroah Baller [place], NP 10/07/16 Ezequiel Ganser    Duffy Bruce, MD 10/07/16 [phone]

## 2016-10-07 NOTE — Discharge Instructions (Signed)
Continue to take the Aleve and Tylenol. Call to schedule a follow up appointment with Dr. Berenice Primas, who did your knee surgery. Wear the sling for comfort and apply ice. Remember to do range of motion exercises for your shoulder. Return here as needed.

## 2017-09-20 ENCOUNTER — Other Ambulatory Visit: Payer: Self-pay

## 2017-09-20 ENCOUNTER — Encounter (HOSPITAL_COMMUNITY): Payer: Self-pay | Admitting: Emergency Medicine

## 2017-09-20 ENCOUNTER — Emergency Department (HOSPITAL_COMMUNITY)
Admission: EM | Admit: 2017-09-20 | Discharge: 2017-09-20 | Disposition: A | Discharge status: Home / Self Care | Payer: BLUE CROSS/BLUE SHIELD | Attending: Emergency Medicine | Admitting: Emergency Medicine

## 2017-09-20 ENCOUNTER — Emergency Department (HOSPITAL_COMMUNITY): Payer: BLUE CROSS/BLUE SHIELD

## 2017-09-20 DIAGNOSIS — E119 Type 2 diabetes mellitus without complications: Secondary | ICD-10-CM | POA: Insufficient documentation

## 2017-09-20 DIAGNOSIS — Z79899 Other long term (current) drug therapy: Secondary | ICD-10-CM | POA: Insufficient documentation

## 2017-09-20 DIAGNOSIS — Z96651 Presence of right artificial knee joint: Secondary | ICD-10-CM | POA: Insufficient documentation

## 2017-09-20 DIAGNOSIS — Z7982 Long term (current) use of aspirin: Secondary | ICD-10-CM | POA: Insufficient documentation

## 2017-09-20 DIAGNOSIS — Z7984 Long term (current) use of oral hypoglycemic drugs: Secondary | ICD-10-CM | POA: Diagnosis not present

## 2017-09-20 DIAGNOSIS — R1011 Right upper quadrant pain: Secondary | ICD-10-CM | POA: Diagnosis present

## 2017-09-20 DIAGNOSIS — F17210 Nicotine dependence, cigarettes, uncomplicated: Secondary | ICD-10-CM | POA: Diagnosis not present

## 2017-09-20 DIAGNOSIS — R1013 Epigastric pain: Secondary | ICD-10-CM | POA: Diagnosis not present

## 2017-09-20 LAB — CBC
HCT: 44.9 % (ref 39.0–52.0)
Hemoglobin: 16 g/dL (ref 13.0–17.0)
MCH: 32.2 pg (ref 26.0–34.0)
MCHC: 35.6 g/dL (ref 30.0–36.0)
MCV: 90.3 fL (ref 78.0–100.0)
PLATELETS: 162 10*3/uL (ref 150–400)
RBC: 4.97 MIL/uL (ref 4.22–5.81)
RDW: 13.7 % (ref 11.5–15.5)
WBC: 6.8 10*3/uL (ref 4.0–10.5)

## 2017-09-20 LAB — URINALYSIS, ROUTINE W REFLEX MICROSCOPIC
Bacteria, UA: NONE SEEN
Glucose, UA: 50 mg/dL — AB
Hgb urine dipstick: NEGATIVE
KETONES UR: NEGATIVE mg/dL
Leukocytes, UA: NEGATIVE
Nitrite: NEGATIVE
Protein, ur: 100 mg/dL — AB
SPECIFIC GRAVITY, URINE: 1.026 (ref 1.005–1.030)
pH: 5 (ref 5.0–8.0)

## 2017-09-20 LAB — TYPE AND SCREEN
ABO/RH(D): A POS
ANTIBODY SCREEN: NEGATIVE

## 2017-09-20 LAB — COMPREHENSIVE METABOLIC PANEL
ALT: 168 U/L — AB (ref 17–63)
AST: 111 U/L — AB (ref 15–41)
Albumin: 3.4 g/dL — ABNORMAL LOW (ref 3.5–5.0)
Alkaline Phosphatase: 107 U/L (ref 38–126)
Anion gap: 11 (ref 5–15)
BUN: 14 mg/dL (ref 6–20)
CHLORIDE: 96 mmol/L — AB (ref 101–111)
CO2: 26 mmol/L (ref 22–32)
CREATININE: 0.8 mg/dL (ref 0.61–1.24)
Calcium: 8.7 mg/dL — ABNORMAL LOW (ref 8.9–10.3)
GFR calc Af Amer: >60 mL/min (ref >60)
GLUCOSE: 190 mg/dL — AB (ref 65–99)
Potassium: 3.6 mmol/L (ref 3.5–5.1)
SODIUM: 133 mmol/L — AB (ref 135–145)
Total Bilirubin: 5 mg/dL — ABNORMAL HIGH (ref 0.3–1.2)
Total Protein: 6.7 g/dL (ref 6.5–8.1)

## 2017-09-20 LAB — LIPASE, BLOOD: LIPASE: 89 U/L — AB (ref 11–51)

## 2017-09-20 MED ORDER — ONDANSETRON 4 MG PO TBDP: ORAL_TABLET | ORAL | 0 refills | Status: DC

## 2017-09-20 MED ORDER — MORPHINE SULFATE 15 MG PO TABS: 15.0000 mg | ORAL_TABLET | ORAL | 0 refills | Status: DC | PRN

## 2017-09-20 NOTE — ED Triage Notes (Signed)
Patient is complaining of right quad abdominal pain that radiates to the right side. Patient is complaining of having blood in his urine.

## 2017-09-20 NOTE — Discharge Instructions (Signed)
You had a elevated lipase level, sometimes this means he had pancreatitis.  Typically you would start on a liquid diet and then transition to soups in about 3 days.  He can then transition to solid food if he is feeling better.  If his symptoms worsen he gets a fever he should return to the emergency department.  Otherwise he should see his family doctor in the office.  He had a incidental finding on CT scan concerning for possible cancer to his liver, the radiologist here recommended a PET scan.  Discussed this with your family physician.  Typically we refer you to a urologist when you have hematuria, call the number provided, or ask your PCP about who they would like you to see.  Discuss with your PCP if they want you to see a GI doc.

## 2017-09-20 NOTE — ED Provider Notes (Addendum)
[facility] DEPT Provider Note   CSN: [id number] Arrival date & time: 09/20/17  1820     History   Chief Complaint Chief Complaint  Patient presents with  . Abdominal Pain    HPI Robert Compton is a 72 y.o. male.  72 yo M with a chief complaint of epigastric and right upper quadrant pain nausea vomiting and hematuria.  Going on for the past week.  Having some subjective fevers and chills as well.  Feels that he sometimes coughing up phlegm.  Nothing seems to make this better or worse.  He is able to eat and drink without difficulty.  Feels that his stomach is mildly larger than normal.  Had a history of a hiatal hernia repair denies other surgery.  Describes the pain is sharp and shooting and feels that it goes into his back.  Usually on the right side.  Has had gross hematuria for the past few days but feels it is cleared up over the past day or so.  The history is provided by the patient.  Illness  This is a new problem. The current episode started more than 1 week ago. The problem occurs constantly. The problem has not changed since onset.Associated symptoms include abdominal pain. Pertinent negatives include no chest pain, no headaches and no shortness of breath. Nothing aggravates the symptoms. Nothing relieves the symptoms. He has tried nothing for the symptoms. The treatment provided no relief.    Past Medical History:  Diagnosis Date  . Arthritis   . Diabetes mellitus without complication ([place])     There are no active problems to display for this patient.   Past Surgical History:  Procedure Laterality Date  . JOINT REPLACEMENT Right    knee        Home Medications    Prior to Admission medications   Medication Sig Start Date End Date Taking? Authorizing Provider  aspirin 81 MG tablet Take 81 mg by mouth 2 (two) times daily.   Yes [provider]  Cinnamon 500 MG capsule Take 500 mg by mouth daily.   Yes [provider]  fish oil-omega-3 fatty acids 1000 MG capsule Take 1 g by mouth 2 (two) times daily.   Yes [provider]  fluticasone (FLONASE) 50 MCG/ACT nasal spray Place 1 spray into both nostrils daily as needed for allergies. 07/02/17  Yes [provider]  irbesartan (AVAPRO) 300 MG tablet Take 300 mg by mouth daily. 07/26/17  Yes [provider]  metFORMIN (GLUCOPHAGE) 1000 MG tablet Take 1,000 mg by mouth 2 (two) times daily with a meal.   Yes [provider]  predniSONE (DELTASONE) 20 MG tablet Take 40 mg by mouth daily. For 5 days 09/16/17 09/21/17 Yes [provider]  ramipril (ALTACE) 10 MG tablet Take 10 mg by mouth daily.   Yes [provider]  ranitidine (ZANTAC) 150 MG tablet Take 150 mg by mouth 2 (two) times daily. For 14 doses 09/16/17 09/23/17 Yes [provider]  Red Yeast Rice 600 MG TABS Take 1 tablet by mouth daily.   Yes [provider]  morphine (MSIR) 15 MG tablet Take 1 tablet (15 mg total) by mouth every 4 (four) hours as needed for severe pain. 09/20/17   Deno Etienne, DO  ondansetron (ZOFRAN ODT) 4 MG disintegrating tablet 4mg  ODT q4 hours prn nausea/vomit 09/20/17   Deno Etienne, DO    Family History History reviewed. No pertinent family history.  Social History  Social History   Tobacco Use  . Smoking status: Current Every Day Smoker    Packs/day: 1.50    Years: 8.00    Pack years: 12.00    Types: Cigarettes  . Smokeless tobacco: Never Used  Substance Use Topics  . Alcohol use: No  . Drug use: No     Allergies   Amoxicillin-pot clavulanate and Nitrofurantoin   Review of Systems Review of Systems  Constitutional: Negative for chills and fever.  HENT: Negative for congestion and facial swelling.   Eyes: Negative for discharge and visual disturbance.  Respiratory: Negative for shortness of breath.   Cardiovascular: Negative for chest pain and palpitations.  Gastrointestinal: Positive for  abdominal pain, nausea and vomiting. Negative for diarrhea.  Genitourinary: Positive for hematuria.  Musculoskeletal: Negative for arthralgias and myalgias.  Skin: Negative for color change and rash.  Neurological: Negative for tremors, syncope and headaches.  Psychiatric/Behavioral: Negative for confusion and dysphoric mood.     Physical Exam Updated Vital Signs BP (!) 161/92   Pulse (!) 105   Temp 97.7 F (36.5 C) (Oral)   Resp 15   Ht 6\' 3"  (1.905 m)   Wt (!) 141.1 kg (311 lb)   SpO2 94%   BMI 38.87 kg/m   Physical Exam  Constitutional: He is oriented to person, place, and time. He appears well-developed and well-nourished.  HENT:  Head: Normocephalic and atraumatic.  Eyes: Pupils are equal, round, and reactive to light. EOM are normal.  Neck: Normal range of motion. Neck supple. No JVD present.  Cardiovascular: Normal rate and regular rhythm. Exam reveals no gallop and no friction rub.  No murmur heard. Pulmonary/Chest: No respiratory distress. He has no wheezes.  Abdominal: He exhibits distension (tympanitic to percussion). He exhibits no mass. There is tenderness (epigastric, mild RUQ, negative murphys). There is no rebound and no guarding.  Musculoskeletal: Normal range of motion.  Neurological: He is alert and oriented to person, place, and time.  Skin: No rash noted. No pallor.  Psychiatric: He has a normal mood and affect. His behavior is normal.  Nursing note and vitals reviewed.    ED Treatments / Results  Labs (all labs ordered are listed, but only abnormal results are displayed) Labs Reviewed  LIPASE, BLOOD - Abnormal; Notable for the following components:      Result Value   Lipase 89 (*)    All other components within normal limits  COMPREHENSIVE METABOLIC PANEL - Abnormal; Notable for the following components:   Sodium 133 (*)    Chloride 96 (*)    Glucose, Bld 190 (*)    Calcium 8.7 (*)    Albumin 3.4 (*)    AST 111 (*)    ALT 168 (*)    Total  Bilirubin 5.0 (*)    All other components within normal limits  URINALYSIS, ROUTINE W REFLEX MICROSCOPIC - Abnormal; Notable for the following components:   Color, Urine AMBER (*)    Glucose, UA 50 (*)    Bilirubin Urine MODERATE (*)    Protein, ur 100 (*)    All other components within normal limits  CBC  POC OCCULT BLOOD, ED  TYPE AND SCREEN  ABO/RH    EKG None  Radiology Ct Renal Stone Study  Result Date: 09/20/2017 CLINICAL DATA:  Acute onset of right-sided abdominal pain and hematuria. EXAM: CT ABDOMEN AND PELVIS WITHOUT CONTRAST TECHNIQUE: Multidetector CT imaging of the abdomen and pelvis was performed following the standard protocol without IV contrast.  COMPARISON:  Abdominal ultrasound performed 08/04/2003 FINDINGS: Lower chest: Minimal atelectasis is noted at the lung bases. Scattered coronary artery calcifications are seen. Hepatobiliary: Multiple vague masses are seen within both hepatic lobes, measuring up to 3 cm in size, suspicious for metastatic disease to the liver. The gallbladder is decompressed and grossly unremarkable. The common bile duct is normal in caliber. Pancreas: The pancreas is within normal limits. Spleen: The spleen is unremarkable in appearance. Adrenals/Urinary Tract: The adrenal glands are unremarkable. A left renal cyst is noted. The kidneys are otherwise unremarkable in appearance. There is no evidence of hydronephrosis. No renal or ureteral stones are identified. No significant perinephric stranding is seen. Stomach/Bowel: The stomach is unremarkable in appearance. The small bowel is within normal limits. The appendix is not visualized; there is no evidence for appendicitis. The colon is unremarkable in appearance. Vascular/Lymphatic: Scattered calcification is seen along the abdominal aorta and its branches. The abdominal aorta is otherwise grossly unremarkable. The inferior vena cava is grossly unremarkable. No retroperitoneal lymphadenopathy is seen. No  pelvic sidewall lymphadenopathy is identified. Reproductive: The bladder is mildly distended and grossly unremarkable. The prostate remains normal in size. Other: No additional soft tissue abnormalities are seen. Musculoskeletal: No acute osseous abnormalities are identified. Vacuum phenomenon is noted at T11-T12. The visualized musculature is unremarkable in appearance. IMPRESSION: 1. Multiple vague masses within both hepatic lobes, measuring up to 3 cm in size, suspicious for metastatic disease to the liver. Would correlate with LFTs. PET/CT is recommended for further evaluation, to assess for underlying primary malignancy. 2. Scattered coronary artery calcifications seen. 3. Left renal cyst noted. Aortic Atherosclerosis (ICD10-I70.0). Electronically Signed   By: Garald Balding M.D.   On: 09/20/2017 22:46    Procedures Procedures (including critical care time)  Medications Ordered in ED Medications - No data to display   Initial Impression / Assessment and Plan / ED Course  I have reviewed the triage vital signs and the nursing notes.  Pertinent labs & imaging results that were available during my care of the patient were reviewed by me and considered in my medical decision making (see chart for details).     72 yo M with a chief complaint of right-sided abdominal pain.  My exam was epigastric with a right-sided pain.  Since that he was hematuria is negative for nephrolithiasis.  He had an elevated lipase but the pancreas looks unremarkable on CT.  Gallbladder and common bile ducts look unremarkable on CT as well.  LFTs were elevated, total bili is 5.  The patient does have multiple masses noted in his liver.  I discussed the results with the patient.  He was able to eat a 6 inch sub-in the emergency department.  I do not feel that he needs to be admitted to the hospital.  He will follow-up with his PCP.  Given GI and urology follow-up as well.  The patients results and plan were reviewed and  discussed.   Any x-rays performed were independently reviewed by myself.   Differential diagnosis were considered with the presenting HPI.  Medications - No data to display  Vitals:   09/20/17 1822 09/20/17 1920 09/20/17 2255  BP: (!) 203/102  (!) 161/92  Pulse: (!) 114  (!) 105  Resp: 16  15  Temp: 97.7 F (36.5 C)    TempSrc: Oral    SpO2: 95%  94%  Weight:  (!) 141.1 kg (311 lb)   Height:  6\' 3"  (1.905 m)  Final diagnoses:  Epigastric abdominal pain    Admission/ observation were discussed with the admitting physician, patient and/or family and they are comfortable with the plan.    Final Clinical Impressions(s) / ED Diagnoses   Final diagnoses:  Epigastric abdominal pain    ED Discharge Orders        Ordered    ondansetron (ZOFRAN ODT) 4 MG disintegrating tablet     09/20/17 2325    morphine (MSIR) 15 MG tablet  Every 4 hours PRN     09/20/17 2325       Deno Etienne, DO 09/20/17 [place], [place], DO 09/20/17 2336

## 2017-09-21 LAB — ABO/RH: ABO/RH(D): A POS

## 2017-09-23 ENCOUNTER — Other Ambulatory Visit: Payer: Self-pay | Admitting: Internal Medicine

## 2017-09-23 ENCOUNTER — Ambulatory Visit
Admission: RE | Admit: 2017-09-23 | Discharge: 2017-09-23 | Disposition: A | Discharge status: Home / Self Care | Payer: BLUE CROSS/BLUE SHIELD | Source: Ambulatory Visit | Attending: Internal Medicine | Admitting: Internal Medicine

## 2017-09-23 DIAGNOSIS — R9389 Abnormal findings on diagnostic imaging of other specified body structures: Secondary | ICD-10-CM

## 2017-09-23 DIAGNOSIS — R160 Hepatomegaly, not elsewhere classified: Secondary | ICD-10-CM

## 2017-09-23 DIAGNOSIS — R918 Other nonspecific abnormal finding of lung field: Secondary | ICD-10-CM

## 2017-09-23 MED ORDER — IOPAMIDOL (ISOVUE-300) INJECTION 61%
125.0000 mL | Freq: Once | INTRAVENOUS | Status: AC | PRN
  Administered 2017-09-23: 125 mL via INTRAVENOUS

## 2017-09-26 ENCOUNTER — Other Ambulatory Visit: Payer: Self-pay | Admitting: Hematology & Oncology

## 2017-09-26 DIAGNOSIS — R160 Hepatomegaly, not elsewhere classified: Secondary | ICD-10-CM

## 2017-09-27 ENCOUNTER — Emergency Department (HOSPITAL_COMMUNITY): Payer: BLUE CROSS/BLUE SHIELD

## 2017-09-27 ENCOUNTER — Other Ambulatory Visit: Payer: Self-pay | Admitting: Hematology & Oncology

## 2017-09-27 ENCOUNTER — Other Ambulatory Visit: Payer: Self-pay

## 2017-09-27 ENCOUNTER — Inpatient Hospital Stay (HOSPITAL_COMMUNITY)
Admission: EM | Admit: 2017-09-27 | Discharge: 2017-10-08 | DRG: 643 | Disposition: A | Discharge status: Skilled Nursing Facility | Payer: BLUE CROSS/BLUE SHIELD | Attending: Internal Medicine | Admitting: Internal Medicine

## 2017-09-27 ENCOUNTER — Encounter (HOSPITAL_COMMUNITY): Payer: Self-pay

## 2017-09-27 DIAGNOSIS — C771 Secondary and unspecified malignant neoplasm of intrathoracic lymph nodes: Secondary | ICD-10-CM | POA: Diagnosis present

## 2017-09-27 DIAGNOSIS — R918 Other nonspecific abnormal finding of lung field: Secondary | ICD-10-CM | POA: Diagnosis not present

## 2017-09-27 DIAGNOSIS — E669 Obesity, unspecified: Secondary | ICD-10-CM | POA: Diagnosis present

## 2017-09-27 DIAGNOSIS — E1165 Type 2 diabetes mellitus with hyperglycemia: Secondary | ICD-10-CM | POA: Diagnosis present

## 2017-09-27 DIAGNOSIS — C787 Secondary malignant neoplasm of liver and intrahepatic bile duct: Secondary | ICD-10-CM | POA: Diagnosis present

## 2017-09-27 DIAGNOSIS — M1990 Unspecified osteoarthritis, unspecified site: Secondary | ICD-10-CM | POA: Diagnosis present

## 2017-09-27 DIAGNOSIS — Z881 Allergy status to other antibiotic agents status: Secondary | ICD-10-CM

## 2017-09-27 DIAGNOSIS — Z7984 Long term (current) use of oral hypoglycemic drugs: Secondary | ICD-10-CM

## 2017-09-27 DIAGNOSIS — R042 Hemoptysis: Secondary | ICD-10-CM | POA: Diagnosis not present

## 2017-09-27 DIAGNOSIS — Z6838 Body mass index (BMI) 38.0-38.9, adult: Secondary | ICD-10-CM | POA: Diagnosis not present

## 2017-09-27 DIAGNOSIS — C3490 Malignant neoplasm of unspecified part of unspecified bronchus or lung: Secondary | ICD-10-CM

## 2017-09-27 DIAGNOSIS — G4700 Insomnia, unspecified: Secondary | ICD-10-CM | POA: Diagnosis present

## 2017-09-27 DIAGNOSIS — E119 Type 2 diabetes mellitus without complications: Secondary | ICD-10-CM

## 2017-09-27 DIAGNOSIS — K769 Liver disease, unspecified: Secondary | ICD-10-CM | POA: Diagnosis not present

## 2017-09-27 DIAGNOSIS — R627 Adult failure to thrive: Secondary | ICD-10-CM | POA: Diagnosis present

## 2017-09-27 DIAGNOSIS — R319 Hematuria, unspecified: Secondary | ICD-10-CM | POA: Diagnosis present

## 2017-09-27 DIAGNOSIS — M549 Dorsalgia, unspecified: Secondary | ICD-10-CM

## 2017-09-27 DIAGNOSIS — J9601 Acute respiratory failure with hypoxia: Secondary | ICD-10-CM | POA: Diagnosis present

## 2017-09-27 DIAGNOSIS — R591 Generalized enlarged lymph nodes: Secondary | ICD-10-CM | POA: Diagnosis not present

## 2017-09-27 DIAGNOSIS — R0602 Shortness of breath: Secondary | ICD-10-CM

## 2017-09-27 DIAGNOSIS — R945 Abnormal results of liver function studies: Secondary | ICD-10-CM | POA: Diagnosis not present

## 2017-09-27 DIAGNOSIS — Z5111 Encounter for antineoplastic chemotherapy: Secondary | ICD-10-CM | POA: Diagnosis not present

## 2017-09-27 DIAGNOSIS — Z888 Allergy status to other drugs, medicaments and biological substances status: Secondary | ICD-10-CM

## 2017-09-27 DIAGNOSIS — J449 Chronic obstructive pulmonary disease, unspecified: Secondary | ICD-10-CM | POA: Diagnosis present

## 2017-09-27 DIAGNOSIS — K219 Gastro-esophageal reflux disease without esophagitis: Secondary | ICD-10-CM | POA: Diagnosis present

## 2017-09-27 DIAGNOSIS — C3411 Malignant neoplasm of upper lobe, right bronchus or lung: Secondary | ICD-10-CM | POA: Diagnosis present

## 2017-09-27 DIAGNOSIS — Z96651 Presence of right artificial knee joint: Secondary | ICD-10-CM | POA: Diagnosis present

## 2017-09-27 DIAGNOSIS — F17210 Nicotine dependence, cigarettes, uncomplicated: Secondary | ICD-10-CM | POA: Diagnosis present

## 2017-09-27 DIAGNOSIS — Z800 Family history of malignant neoplasm of digestive organs: Secondary | ICD-10-CM | POA: Diagnosis not present

## 2017-09-27 DIAGNOSIS — R160 Hepatomegaly, not elsewhere classified: Secondary | ICD-10-CM | POA: Diagnosis not present

## 2017-09-27 DIAGNOSIS — C799 Secondary malignant neoplasm of unspecified site: Secondary | ICD-10-CM

## 2017-09-27 DIAGNOSIS — R531 Weakness: Secondary | ICD-10-CM | POA: Diagnosis present

## 2017-09-27 DIAGNOSIS — K920 Hematemesis: Secondary | ICD-10-CM | POA: Diagnosis present

## 2017-09-27 DIAGNOSIS — E222 Syndrome of inappropriate secretion of antidiuretic hormone: Principal | ICD-10-CM | POA: Diagnosis present

## 2017-09-27 DIAGNOSIS — K5900 Constipation, unspecified: Secondary | ICD-10-CM | POA: Diagnosis present

## 2017-09-27 DIAGNOSIS — I10 Essential (primary) hypertension: Secondary | ICD-10-CM | POA: Diagnosis present

## 2017-09-27 DIAGNOSIS — E871 Hypo-osmolality and hyponatremia: Secondary | ICD-10-CM | POA: Diagnosis present

## 2017-09-27 DIAGNOSIS — E876 Hypokalemia: Secondary | ICD-10-CM | POA: Diagnosis not present

## 2017-09-27 DIAGNOSIS — T451X5A Adverse effect of antineoplastic and immunosuppressive drugs, initial encounter: Secondary | ICD-10-CM | POA: Diagnosis present

## 2017-09-27 DIAGNOSIS — T380X5A Adverse effect of glucocorticoids and synthetic analogues, initial encounter: Secondary | ICD-10-CM | POA: Diagnosis present

## 2017-09-27 DIAGNOSIS — Z7982 Long term (current) use of aspirin: Secondary | ICD-10-CM

## 2017-09-27 DIAGNOSIS — E875 Hyperkalemia: Secondary | ICD-10-CM | POA: Diagnosis not present

## 2017-09-27 DIAGNOSIS — K1230 Oral mucositis (ulcerative), unspecified: Secondary | ICD-10-CM | POA: Diagnosis present

## 2017-09-27 DIAGNOSIS — K7200 Acute and subacute hepatic failure without coma: Secondary | ICD-10-CM

## 2017-09-27 DIAGNOSIS — C3491 Malignant neoplasm of unspecified part of right bronchus or lung: Secondary | ICD-10-CM

## 2017-09-27 HISTORY — DX: Malignant (primary) neoplasm, unspecified: C80.1

## 2017-09-27 HISTORY — DX: Essential (primary) hypertension: I10

## 2017-09-27 LAB — TROPONIN I

## 2017-09-27 LAB — COMPREHENSIVE METABOLIC PANEL
ALBUMIN: 3.1 g/dL — AB (ref 3.5–5.0)
ALK PHOS: 156 U/L — AB (ref 38–126)
ALT: 224 U/L — ABNORMAL HIGH (ref 17–63)
ANION GAP: 11 (ref 5–15)
AST: 133 U/L — ABNORMAL HIGH (ref 15–41)
BUN: 9 mg/dL (ref 6–20)
CHLORIDE: 79 mmol/L — AB (ref 101–111)
CO2: 27 mmol/L (ref 22–32)
Calcium: 8.8 mg/dL — ABNORMAL LOW (ref 8.9–10.3)
Creatinine, Ser: 0.55 mg/dL — ABNORMAL LOW (ref 0.61–1.24)
GFR calc Af Amer: >60 mL/min (ref >60)
GFR calc non Af Amer: >60 mL/min (ref >60)
Glucose, Bld: 143 mg/dL — ABNORMAL HIGH (ref 65–99)
POTASSIUM: 4.7 mmol/L (ref 3.5–5.1)
SODIUM: 117 mmol/L — AB (ref 135–145)
Total Bilirubin: 12.3 mg/dL — ABNORMAL HIGH (ref 0.3–1.2)
Total Protein: 6.9 g/dL (ref 6.5–8.1)

## 2017-09-27 LAB — LIPASE, BLOOD: LIPASE: 85 U/L — AB (ref 11–51)

## 2017-09-27 LAB — CBC WITH DIFFERENTIAL/PLATELET
BASOS ABS: 0 10*3/uL (ref 0.0–0.1)
Basophils Relative: 0 %
Eosinophils Absolute: 0.1 10*3/uL (ref 0.0–0.7)
Eosinophils Relative: 1 %
HCT: 44.8 % (ref 39.0–52.0)
HEMOGLOBIN: 16.5 g/dL (ref 13.0–17.0)
LYMPHS ABS: 0.9 10*3/uL (ref 0.7–4.0)
Lymphocytes Relative: 9 %
MCH: 32.2 pg (ref 26.0–34.0)
MCHC: 36.8 g/dL — ABNORMAL HIGH (ref 30.0–36.0)
MCV: 87.5 fL (ref 78.0–100.0)
Monocytes Absolute: 1 10*3/uL (ref 0.1–1.0)
Monocytes Relative: 10 %
NEUTROS PCT: 80 %
Neutro Abs: 8.1 10*3/uL — ABNORMAL HIGH (ref 1.7–7.7)
Platelets: 134 10*3/uL — ABNORMAL LOW (ref 150–400)
RBC: 5.12 MIL/uL (ref 4.22–5.81)
RDW: 13.8 % (ref 11.5–15.5)
WBC: 10.1 10*3/uL (ref 4.0–10.5)

## 2017-09-27 LAB — AMMONIA: Ammonia: 33 umol/L (ref 9–35)

## 2017-09-27 LAB — BLOOD GAS, VENOUS
ACID-BASE EXCESS: 2.4 mmol/L — AB (ref 0.0–2.0)
Bicarbonate: 27.9 mmol/L (ref 20.0–28.0)
O2 SAT: 60.1 %
PCO2 VEN: 48.1 mmHg (ref 44.0–60.0)
PO2 VEN: 32.7 mmHg (ref 32.0–45.0)
Patient temperature: 98.6
pH, Ven: 7.382 (ref 7.250–7.430)

## 2017-09-27 LAB — MAGNESIUM: MAGNESIUM: 1.8 mg/dL (ref 1.7–2.4)

## 2017-09-27 LAB — LACTIC ACID, PLASMA: Lactic Acid, Venous: 1.4 mmol/L (ref 0.5–1.9)

## 2017-09-27 MED ORDER — TRAMADOL HCL 50 MG PO TABS: 50.0000 mg | ORAL_TABLET | Freq: Four times a day (QID) | ORAL | Status: DC | PRN

## 2017-09-27 MED ORDER — ONDANSETRON HCL 4 MG/2ML IJ SOLN
4.0000 mg | Freq: Once | INTRAMUSCULAR | Status: AC
  Administered 2017-09-27: 4 mg via INTRAVENOUS
  Filled 2017-09-27: qty 2

## 2017-09-27 MED ORDER — GUAIFENESIN ER 600 MG PO TB12
600.0000 mg | ORAL_TABLET | Freq: Two times a day (BID) | ORAL | Status: DC
  Administered 2017-09-28 – 2017-09-29 (×4): 600 mg via ORAL
  Filled 2017-09-27 (×4): qty 1

## 2017-09-27 MED ORDER — ALBUTEROL SULFATE (2.5 MG/3ML) 0.083% IN NEBU
2.5000 mg | INHALATION_SOLUTION | RESPIRATORY_TRACT | Status: DC | PRN
  Administered 2017-09-29: 2.5 mg via RESPIRATORY_TRACT
  Filled 2017-09-27: qty 3

## 2017-09-27 MED ORDER — INSULIN ASPART 100 UNIT/ML ~~LOC~~ SOLN
0.0000 [IU] | SUBCUTANEOUS | Status: DC
  Administered 2017-09-28 (×2): 1 [IU] via SUBCUTANEOUS
  Administered 2017-09-28: 2 [IU] via SUBCUTANEOUS

## 2017-09-27 MED ORDER — SENNA 8.6 MG PO TABS
1.0000 | ORAL_TABLET | Freq: Two times a day (BID) | ORAL | Status: DC
  Administered 2017-09-28 – 2017-10-03 (×12): 8.6 mg via ORAL
  Filled 2017-09-27 (×13): qty 1

## 2017-09-27 MED ORDER — ONDANSETRON HCL 4 MG PO TABS: 4.0000 mg | ORAL_TABLET | Freq: Four times a day (QID) | ORAL | Status: DC | PRN

## 2017-09-27 MED ORDER — FAMOTIDINE 20 MG PO TABS
10.0000 mg | ORAL_TABLET | Freq: Every day | ORAL | Status: DC
  Administered 2017-09-28 – 2017-10-08 (×11): 10 mg via ORAL
  Filled 2017-09-27 (×11): qty 1

## 2017-09-27 MED ORDER — SODIUM CHLORIDE 0.9 % IV BOLUS
1000.0000 mL | Freq: Once | INTRAVENOUS | Status: AC
  Administered 2017-09-27: 1000 mL via INTRAVENOUS

## 2017-09-27 MED ORDER — SODIUM CHLORIDE 0.9 % IV SOLN
INTRAVENOUS | Status: DC
  Administered 2017-09-28: via INTRAVENOUS

## 2017-09-27 MED ORDER — BISACODYL 10 MG RE SUPP: 10.0000 mg | Freq: Every day | RECTAL | Status: DC | PRN

## 2017-09-27 MED ORDER — POLYETHYLENE GLYCOL 3350 17 G PO PACK
17.0000 g | PACK | Freq: Every day | ORAL | Status: DC | PRN
  Administered 2017-10-02 – 2017-10-03 (×2): 17 g via ORAL
  Filled 2017-09-27 (×2): qty 1

## 2017-09-27 MED ORDER — ONDANSETRON HCL 4 MG/2ML IJ SOLN
4.0000 mg | Freq: Four times a day (QID) | INTRAMUSCULAR | Status: DC | PRN
  Administered 2017-09-30: 4 mg via INTRAVENOUS
  Filled 2017-09-27: qty 2

## 2017-09-27 NOTE — ED Provider Notes (Signed)
[facility] DEPT Provider Note   CSN: [id number] Arrival date & time: 09/27/17  1820     History   Chief Complaint Chief Complaint  Patient presents with  . Weakness    HPI Robert Compton is a 72 y.o. male.  13-year-old male with history of arthritis hypertension diabetes and unfortunately new diagnosis of metastatic cancer likely lung.  He was here about a week ago with hematuria and nausea and found to have liver mets.  Since then he had an outpatient chest and abdomen CT with contrast and a PET scan.  His daughters bring him in today for a week's worth of just continued decline.  He is been getting progressively more weak to the point where he can even get up to go to the bathroom.  He is continuously nauseous which is not controlled by Zofran.  He was sent home with some morphine for pain but it makes him more nauseous and he can take it.  It seems like he is sleeping all the time and just generalized weak that he cannot get himself up.  I have not been able to make any contact with oncology yet.  There is been no no fever.  He does have a productive cough.  The history is provided by the patient and a relative.  Weakness  Primary symptoms include no focal weakness, no loss of balance, no speech change, no movement disorder, no visual change, no auditory change, and no dizziness. This is a new problem. The current episode started more than 1 week ago. The problem has been rapidly worsening. There was no focality noted. There has been no fever. Pertinent negatives include no shortness of breath, no chest pain, no altered mental status and no confusion. Associated medical issues do not include trauma or CVA.    Past Medical History:  Diagnosis Date  . Arthritis   . Cancer ([place])   . Diabetes mellitus without complication ([place])   . Hypertension     There are no active problems to display for this patient.   Past Surgical History:  Procedure Laterality  Date  . JOINT REPLACEMENT Right    knee        Home Medications    Prior to Admission medications   Medication Sig Start Date End Date Taking? Authorizing Provider  aspirin 81 MG tablet Take 81 mg by mouth 2 (two) times daily.   Yes [provider]  Cinnamon 500 MG capsule Take 500 mg by mouth daily.   Yes [provider]  fish oil-omega-3 fatty acids 1000 MG capsule Take 1 g by mouth 2 (two) times daily.   Yes [provider]  fluticasone (FLONASE) 50 MCG/ACT nasal spray Place 1 spray into both nostrils daily as needed for allergies. 07/02/17  Yes [provider]  irbesartan (AVAPRO) 300 MG tablet Take 300 mg by mouth daily. 07/26/17  Yes [provider]  metFORMIN (GLUCOPHAGE) 1000 MG tablet Take 1,000 mg by mouth 2 (two) times daily with a meal.   Yes [provider]  morphine (MSIR) 15 MG tablet Take 1 tablet (15 mg total) by mouth every 4 (four) hours as needed for severe pain. 09/20/17  Yes Deno Etienne, DO  ondansetron (ZOFRAN ODT) 4 MG disintegrating tablet 4mg  ODT q4 hours prn nausea/vomit 09/20/17  Yes Deno Etienne, DO  ranitidine (ZANTAC) 150 MG tablet Take 150 mg by mouth 2 (two) times daily. For 14 doses 09/16/17 09/27/17 Yes [provider]  Red Yeast Rice 600 MG TABS Take 1 tablet by mouth daily.   Yes [provider]    Family History History reviewed. No pertinent family history.  Social History Social History   Tobacco Use  . Smoking status: Current Every Day Smoker    Packs/day: 1.50    Years: 8.00    Pack years: 12.00    Types: Cigarettes  . Smokeless tobacco: Never Used  Substance Use Topics  . Alcohol use: No  . Drug use: No     Allergies   Amoxicillin-pot clavulanate and Nitrofurantoin   Review of Systems Review of Systems  Constitutional: Positive for activity change, appetite change and fatigue. Negative for chills and fever.  HENT: Negative for ear pain and sore throat.   Eyes:  Negative for pain and visual disturbance.  Respiratory: Positive for cough. Negative for shortness of breath.   Cardiovascular: Positive for leg swelling. Negative for chest pain.  Gastrointestinal: Positive for abdominal pain and nausea.  Genitourinary: Positive for hematuria. Negative for dysuria.  Musculoskeletal: Negative for back pain and neck pain.  Skin: Negative for rash and wound.  Neurological: Positive for weakness. Negative for dizziness, speech change, focal weakness, seizures and loss of balance.  Psychiatric/Behavioral: Negative for confusion.     Physical Exam Updated Vital Signs BP (!) 150/77   Pulse 94   Temp 97.8 F (36.6 C) (Oral)   Resp 18   Ht 6\' 3"  (1.905 m)   Wt (!) 140.6 kg (310 lb)   SpO2 94%   BMI 38.75 kg/m   Physical Exam  Constitutional: Vital signs are normal. He appears well-developed and well-nourished. He appears listless. No distress.  HENT:  Head: Normocephalic and atraumatic.  Eyes: Conjunctivae are normal.  Neck: Neck supple.  Cardiovascular: Normal rate and regular rhythm.  No murmur heard. Pulmonary/Chest: Effort normal and breath sounds normal. No respiratory distress.  Abdominal: Soft. There is no tenderness.  Musculoskeletal: He exhibits no edema.  Neurological: He has normal strength. He appears listless. No sensory deficit. GCS eye subscore is 4. GCS verbal subscore is 5. GCS motor subscore is 6.  Skin: Skin is warm and dry. He is not diaphoretic.  Psychiatric: He has a normal mood and affect.  Nursing note and vitals reviewed.    ED Treatments / Results  Labs (all labs ordered are listed, but only abnormal results are displayed) Labs Reviewed  CBC WITH DIFFERENTIAL/PLATELET - Abnormal; Notable for the following components:      Result Value   MCHC 36.8 (*)    Platelets 134 (*)    Neutro Abs 8.1 (*)    All other components within normal limits  LIPASE, BLOOD - Abnormal; Notable for the following components:   Lipase 85  (*)    All other components within normal limits  COMPREHENSIVE METABOLIC PANEL - Abnormal; Notable for the following components:   Sodium 117 (*)    Chloride 79 (*)    Glucose, Bld 143 (*)    Creatinine, Ser 0.55 (*)    Calcium 8.8 (*)    Albumin 3.1 (*)    AST 133 (*)    ALT 224 (*)    Alkaline Phosphatase 156 (*)    Total Bilirubin 12.3 (*)    All other components within normal limits  URINALYSIS, ROUTINE W REFLEX MICROSCOPIC - Abnormal; Notable for the following components:   Color, Urine AMBER (*)    Glucose, UA 50 (*)    Hgb urine dipstick SMALL (*)  Bilirubin Urine MODERATE (*)    Protein, ur 100 (*)    All other components within normal limits  BLOOD GAS, VENOUS - Abnormal; Notable for the following components:   Acid-Base Excess 2.4 (*)    All other components within normal limits  PREALBUMIN - Abnormal; Notable for the following components:   Prealbumin 6.6 (*)    All other components within normal limits  HEMOGLOBIN A1C - Abnormal; Notable for the following components:   Hgb A1c MFr Bld 7.2 (*)    All other components within normal limits  BASIC METABOLIC PANEL - Abnormal; Notable for the following components:   Sodium 121 (*)    Chloride 84 (*)    Glucose, Bld 164 (*)    Creatinine, Ser 0.60 (*)    Calcium 8.3 (*)    All other components within normal limits  MAGNESIUM - Abnormal; Notable for the following components:   Magnesium 1.6 (*)    All other components within normal limits  COMPREHENSIVE METABOLIC PANEL - Abnormal; Notable for the following components:   Sodium 119 (*)    Chloride 83 (*)    Glucose, Bld 119 (*)    Creatinine, Ser 0.56 (*)    Calcium 8.2 (*)    Total Protein 6.2 (*)    Albumin 2.7 (*)    AST 103 (*)    ALT 176 (*)    Alkaline Phosphatase 145 (*)    Total Bilirubin 11.1 (*)    All other components within normal limits  CBC - Abnormal; Notable for the following components:   Platelets 127 (*)    All other components within  normal limits  GLUCOSE, CAPILLARY - Abnormal; Notable for the following components:   Glucose-Capillary 137 (*)    All other components within normal limits  OSMOLALITY - Abnormal; Notable for the following components:   Osmolality 252 (*)    All other components within normal limits  GLUCOSE, CAPILLARY - Abnormal; Notable for the following components:   Glucose-Capillary 162 (*)    All other components within normal limits  GLUCOSE, CAPILLARY - Abnormal; Notable for the following components:   Glucose-Capillary 124 (*)    All other components within normal limits  MRSA PCR SCREENING  URINE CULTURE  TROPONIN I  MAGNESIUM  SODIUM, URINE, RANDOM  CREATININE, URINE, RANDOM  OSMOLALITY, URINE  AMMONIA  LACTIC ACID, PLASMA  PROTIME-INR  PHOSPHORUS  TSH  CK  BASIC METABOLIC PANEL  BASIC METABOLIC PANEL  HIV ANTIBODY (ROUTINE TESTING)    EKG EKG Interpretation  Date/Time:  Friday September 27 2017 20:24:51 EDT Ventricular Rate:  91 PR Interval:    QRS Duration: 105 QT Interval:  382 QTC Calculation: 470 R Axis:   -67 Text Interpretation:  Sinus rhythm Atrial premature complex Left anterior fascicular block Baseline wander in lead(s) I II aVR aVL aVF sinus replacing afib from prior ecg 6/10 Confirmed by Aletta Edouard [phone]) on 09/27/2017 8:42:06 PM   Radiology Dg Chest Port 1 View  Result Date: 09/27/2017 CLINICAL DATA:  Weakness. EXAM: PORTABLE CHEST 1 VIEW COMPARISON:  None. FINDINGS: The heart size is borderline to mildly enlarged. The hila, mediastinum, lungs, and pleura are unremarkable. IMPRESSION: No active disease. Electronically Signed   By: Dorise Bullion III M.D   On: 09/27/2017 19:49    Procedures .Critical Care Performed by: Hayden Rasmussen, MD Authorized by: Hayden Rasmussen, MD   Critical care provider statement:    Critical care time (minutes):  40  Critical care time was exclusive of:  Separately billable procedures and treating other patients    Critical care was necessary to treat or prevent imminent or life-threatening deterioration of the following conditions:  Metabolic crisis   Critical care was time spent personally by me on the following activities:  Development of treatment plan with patient or surrogate, discussions with consultants, evaluation of patient's response to treatment, examination of patient, obtaining history from patient or surrogate, ordering and performing treatments and interventions, ordering and review of laboratory studies, ordering and review of radiographic studies, pulse oximetry, re-evaluation of patient's condition and review of old charts   I assumed direction of critical care for this patient from another provider in my specialty: no     (including critical care time)  Medications Ordered in ED Medications  sodium chloride 0.9 % bolus 1,000 mL (has no administration in time range)  ondansetron (ZOFRAN) injection 4 mg (has no administration in time range)     Initial Impression / Assessment and Plan / ED Course  I have reviewed the triage vital signs and the nursing notes.  Pertinent labs & imaging results that were available during my care of the patient were reviewed by me and considered in my medical decision making (see chart for details).  Clinical Course as of Sep 28 1037  Fri Sep 27, 2017  2120 Patient's labs back and there are multiple significant findings.  His sodium is extremely low at 117 which may predispose him to seizures.  Also his LFTs are all elevated with a T bili of 12.3.  Reviewed these findings with the patient and he is agreeable to admission.  Hospitalist paged for admission.   [MB]    Clinical Course User Index [MB] Hayden Rasmussen, MD     Final Clinical Impressions(s) / ED Diagnoses   Final diagnoses:  Hyponatremia  Acute liver failure without hepatic coma  Metastatic cancer [facility])    ED Discharge Orders    None       Hayden Rasmussen, MD 09/28/17  1043

## 2017-09-27 NOTE — ED Notes (Signed)
Date and time results received: 09/27/17 2114   Test: Sodium Critical Value: 117   Name of Provider Notified: Melina Copa, MD  Orders Received? Or Actions Taken?:

## 2017-09-27 NOTE — ED Notes (Signed)
Pt aware urine sample and culture needed

## 2017-09-27 NOTE — H&P (Signed)
Kurk Corniel TZG:[id number] DOB: 06/24/1945 DOA: 09/27/2017     PCP: [name], MD   Outpatient Specialists:      Oncology  Dr. Marin Olp Patient arrived to ER on 09/27/17 at [place]  Patient coming from:  home Lives   With family    Chief Complaint:  Chief Complaint  Patient presents with  . Weakness    HPI: Aedin Jeansonne is a 72 y.o. male with medical history significant of hypertension diabetes type 2 and  new diagnosis of metastatic cancer likely lung with liver mets    Presented with generalized fatigue persistent nausea poorly controlled Seen in the emergency department on 7 June with abdominal distention and hematuria CT abdomen occipital masses in his liver. He was discharged home with follow-up with his primary care provider GI and urology with prescription for morphine 15 mg every 4 hours and  Zofran.  Since then he has been seen by his primary care provider On 10 June undergone contrasted CT of abdomen and chest 2.7 cm likely hepatic metastases mediastinal lymphadenopathy likely metastatic lymphadenopathy irregular mass within right lung apex measuring 3 cm for primary lung cancer.  Dr. Marin Olp the oncology has ordered ultrasound-guided liver biopsy to be done  Unfortunately patient continued to deteriorate he reports  no fevers or chills no chest pain or shortness of breath no confusion patient has been slowly deteriorating at home. Have been having non productive cough same from prior Family reports generalized weakness and feeling like he had an ear infection his legs were numb. His family gave him Dramamine and he has been even more sedated. Reports Morphine has been too strong resulting in sedation and severe constipation.   Regarding pertinent Chronic problems: recently diagnosed with metastatic lung cancer with mets to the liver.    While in ER: Now was found to have rising total bili up to 12 from previously 5 and following sodium down to 117 from  133  Following Medications were ordered in ER: Medications  sodium chloride 0.9 % bolus 1,000 mL (0 mLs Intravenous Stopped 09/27/17 2121)  ondansetron (ZOFRAN) injection 4 mg (4 mg Intravenous Given 09/27/17 2003)    Significant initial  Findings: Abnormal Labs Reviewed  CBC WITH DIFFERENTIAL/PLATELET - Abnormal; Notable for the following components:      Result Value   MCHC 36.8 (*)    Platelets 134 (*)    Neutro Abs 8.1 (*)    All other components within normal limits  LIPASE, BLOOD - Abnormal; Notable for the following components:   Lipase 85 (*)    All other components within normal limits  COMPREHENSIVE METABOLIC PANEL - Abnormal; Notable for the following components:   Sodium 117 (*)    Chloride 79 (*)    Glucose, Bld 143 (*)    Creatinine, Ser 0.55 (*)    Calcium 8.8 (*)    Albumin 3.1 (*)    AST 133 (*)    ALT 224 (*)    Alkaline Phosphatase 156 (*)    Total Bilirubin 12.3 (*)    All other components within normal limits     Na 117 K 4.7  Cr stable,  Lab Results  Component Value Date   CREATININE 0.55 (L) 09/27/2017   CREATININE 0.80 09/20/2017   CREATININE 1.17 10/05/2008      WBC 10.1   HG/HCT  stable    Component Value Date/Time   HGB 16.5 09/27/2017 2000   HCT 44.8 09/27/2017 2000   plt 134  Troponin (Point of Care Test) No results for input(s): TROPIPOC in the last 72 hours.  Lipase 85     UA  ordered   CXR -  NON acute     ECG:  Personally reviewed by me showing: HR : 91 Rhythm:  SR,  With PAC's   no evidence of ischemic changes QTC 470    ED Triage Vitals  Enc Vitals Group     BP 09/27/17 1841 (!) 150/77     Pulse Rate 09/27/17 1841 94     Resp 09/27/17 1841 18     Temp 09/27/17 1841 97.8 F (36.6 C)     Temp Source 09/27/17 1841 Oral     SpO2 09/27/17 1830 94 %     Weight 09/27/17 1844 (!) 310 lb (140.6 kg)     Height 09/27/17 1844 6\' 3"  (1.905 m)     Head Circumference --      Peak Flow --      Pain Score 09/27/17  1843 2     Pain Loc --      Pain Edu? --      Excl. in [place]? --   TMAX(24)@       Latest  Blood pressure (!) 156/80, pulse 96, temperature 97.8 F (36.6 C), temperature source Oral, resp. rate 19, height 6\' 3"  (1.905 m), weight (!) 140.6 kg (310 lb), SpO2 93 %.     Hospitalist was called for admission for hyponatremia with worsening liver function.     Review of Systems:    Pertinent positives include: fatigue, weight loss  shortness of breath at rest. dyspnea on exertion, non-productive cough, Constitutional:  No weight loss, night sweats, Fevers, chills,  HEENT:  No headaches, Difficulty swallowing,Tooth/dental problems,Sore throat,  No sneezing, itching, ear ache, nasal congestion, post nasal drip,  Cardio-vascular:  No chest pain, Orthopnea, PND, anasarca, dizziness, palpitations.no Bilateral lower extremity swelling  GI:  No heartburn, indigestion, abdominal pain, nausea, vomiting, diarrhea, change in bowel habits, loss of appetite, melena, blood in stool, hematemesis Resp:  No excess mucus, no productive cough, No No coughing up of blood.No change in color of mucus.No wheezing. Skin:  no rash or lesions. No jaundice GU:  no dysuria, change in color of urine, no urgency or frequency. No straining to urinate.  No flank pain.  Musculoskeletal:  No joint pain or no joint swelling. No decreased range of motion. No back pain.  Psych:  No change in mood or affect. No depression or anxiety. No memory loss.  Neuro: no localizing neurological complaints, no tingling, no weakness, no double vision, no gait abnormality, no slurred speech, no confusion  As per HPI otherwise 10 point review of systems negative.   Past Medical History:   Past Medical History:  Diagnosis Date  . Arthritis   . Cancer ([place])   . Diabetes mellitus without complication ([place])   . Hypertension       Past Surgical History:  Procedure Laterality Date  . JOINT REPLACEMENT Right    knee     Social History:  Ambulatory   independently      reports that he has been smoking cigarettes.  He has a 12.00 pack-year smoking history. He has never used smokeless tobacco. He reports that he does not drink alcohol or use drugs.     Family History:   Family History  Problem Relation Age of Onset  . Diabetes Mother   . Colon cancer Father   . CAD Neg Hx  Allergies: Allergies  Allergen Reactions  . Amoxicillin-Pot Clavulanate Anaphylaxis  . Nitrofurantoin Anaphylaxis     Prior to Admission medications   Medication Sig Start Date End Date Taking? Authorizing Provider  aspirin 81 MG tablet Take 81 mg by mouth 2 (two) times daily.   Yes [provider]  Cinnamon 500 MG capsule Take 500 mg by mouth daily.   Yes [provider]  fish oil-omega-3 fatty acids 1000 MG capsule Take 1 g by mouth 2 (two) times daily.   Yes [provider]  fluticasone (FLONASE) 50 MCG/ACT nasal spray Place 1 spray into both nostrils daily as needed for allergies. 07/02/17  Yes [provider]  irbesartan (AVAPRO) 300 MG tablet Take 300 mg by mouth daily. 07/26/17  Yes [provider]  metFORMIN (GLUCOPHAGE) 1000 MG tablet Take 1,000 mg by mouth 2 (two) times daily with a meal.   Yes [provider]  morphine (MSIR) 15 MG tablet Take 1 tablet (15 mg total) by mouth every 4 (four) hours as needed for severe pain. 09/20/17  Yes Deno Etienne, DO  ondansetron (ZOFRAN ODT) 4 MG disintegrating tablet 4mg  ODT q4 hours prn nausea/vomit 09/20/17  Yes Deno Etienne, DO  ranitidine (ZANTAC) 150 MG tablet Take 150 mg by mouth 2 (two) times daily. For 14 doses 09/16/17 09/27/17 Yes [provider]  Red Yeast Rice 600 MG TABS Take 1 tablet by mouth daily.   Yes [provider]   Physical Exam: Blood pressure (!) 156/80, pulse 96, temperature 97.8 F (36.6 C), temperature source Oral, resp. rate 19, height 6\' 3"  (1.905 m), weight (!) 140.6 kg (310 lb),  SpO2 93 %. 1. General:  in No Acute distress  Chronically ill  -appearing 2. Psychological: Alert and  Oriented 3. Head/ENT:     Dry Mucous Membranes                          Head Non traumatic, neck supple                         Poor Dentition 4. SKIN:   decreased Skin turgor,  Skin clean Dry and intact no rash 5. Heart: Regular rate and rhythm no  Murmur, no Rub or gallop 6. Lungs: no wheezes or crackles   7. Abdomen: Soft,  non-tender,  Distended  Obese bowel sounds present 8. Lower extremities: no clubbing, cyanosis, or edema 9. Neurologically Grossly intact, moving all 4 extremities equally  10. MSK: Normal range of motion   LABS:     Recent Labs  Lab 09/27/17 2000  WBC 10.1  NEUTROABS 8.1*  HGB 16.5  HCT 44.8  MCV 87.5  PLT 784*   Basic Metabolic Panel: Recent Labs  Lab 09/27/17 2000  NA 117*  K 4.7  CL 79*  CO2 27  GLUCOSE 143*  BUN 9  CREATININE 0.55*  CALCIUM 8.8*  MG 1.8      Recent Labs  Lab 09/27/17 2000  AST 133*  ALT 224*  ALKPHOS 156*  BILITOT 12.3*  PROT 6.9  ALBUMIN 3.1*   Recent Labs  Lab 09/27/17 2000  LIPASE 85*   No results for input(s): AMMONIA in the last 168 hours.    HbA1C: No results for input(s): HGBA1C in the last 72 hours. CBG: No results for input(s): GLUCAP in the last 168 hours.    Urine analysis:    Component Value Date/Time  COLORURINE AMBER (A) 09/20/2017 1932   APPEARANCEUR CLEAR 09/20/2017 1932   LABSPEC 1.026 09/20/2017 1932   PHURINE 5.0 09/20/2017 1932   GLUCOSEU 50 (A) 09/20/2017 1932   HGBUR NEGATIVE 09/20/2017 1932   BILIRUBINUR MODERATE (A) 09/20/2017 1932   KETONESUR NEGATIVE 09/20/2017 1932   PROTEINUR 100 (A) 09/20/2017 1932   NITRITE NEGATIVE 09/20/2017 1932   LEUKOCYTESUR NEGATIVE 09/20/2017 1932       Cultures: No results found for: [place], [place], CULT, REPTSTATUS   Radiological Exams on Admission: Dg Chest Port 1 View  Result Date: 09/27/2017 CLINICAL DATA:  Weakness.  EXAM: PORTABLE CHEST 1 VIEW COMPARISON:  None. FINDINGS: The heart size is borderline to mildly enlarged. The hila, mediastinum, lungs, and pleura are unremarkable. IMPRESSION: No active disease. Electronically Signed   By: Dorise Bullion III M.D   On: 09/27/2017 19:49    Chart has been reviewed    Assessment/Plan  72 y.o. male with medical history significant of hypertension diabetes type 2 and  new diagnosis of metastatic cancer likely lung with liver mets Admitted for hyponatremia with worsening liver function.   Present on Admission: . Hyponatremia -patient reports first and has had decreased p.o. intake although SIADH cannot be completely ruled out but hypovolemia is a strong possibility.  Will gently rehydrate follow sodium closely admit to stepdown for frequent lab evaluation.  And monitoring of mental status. . Failure to thrive in adult -PT OT evaluation for safety prior to discharge . Metastatic cancer to liver (HCC)-primary likely   lung discussed with oncology, will need biopsy.  Unlikely to be done before Monday.  For now we will rehydrate.  Obtain echogram in case he will need chemo initiation.  Obtain MRI of the head for further staging given nonspecific neurological complaints.  [name] to see the patient during the weekend . Essential hypertension -restart when able to tolerate  DM 2 -  - Order Sensitive SSI     -  check TSH and HgA1C  - Hold by mouth medications    Other plan as per orders.  DVT prophylaxis:  SCD     Code Status:  FULL CODE  as per patient  I had personally discussed CODE STATUS with patient and family   Family Communication:   Family   at  Bedside  plan of care was discussed with  Daughter,  Disposition Plan:    likely will need placement for rehabilitation                                                Would benefit from PT/OT eval prior to DC                          Consults called: oncology     Admission status:   inpatient        Level of care   SDU       Merrel Crabbe 09/27/2017, 10:54 PM    [facility]  Pager [phone]   after 2 AM please page floor coverage PA If 7AM-7PM, please contact the day team taking care of the patient  [email or web address]  Password [email or web address]

## 2017-09-27 NOTE — ED Triage Notes (Signed)
Per EMS: Pt c/o of generalized weakness the past couple of days.  He states his feels weaker at night, but now it's the worst it has ever been.  Pt was dx a week ago with stage IV lung CA.  Pt is unable to walk and can barely keep his head up.

## 2017-09-27 NOTE — ED Notes (Signed)
Pt has urinal at bedside

## 2017-09-27 NOTE — ED Notes (Signed)
Bed: YQ03 Expected date:  Expected time:  Means of arrival:  Comments: EMS-SOB

## 2017-09-28 ENCOUNTER — Encounter (HOSPITAL_COMMUNITY): Payer: Self-pay | Admitting: Physician Assistant

## 2017-09-28 ENCOUNTER — Inpatient Hospital Stay (HOSPITAL_COMMUNITY): Payer: BLUE CROSS/BLUE SHIELD

## 2017-09-28 DIAGNOSIS — K769 Liver disease, unspecified: Secondary | ICD-10-CM

## 2017-09-28 DIAGNOSIS — R591 Generalized enlarged lymph nodes: Secondary | ICD-10-CM

## 2017-09-28 DIAGNOSIS — E119 Type 2 diabetes mellitus without complications: Secondary | ICD-10-CM

## 2017-09-28 DIAGNOSIS — R160 Hepatomegaly, not elsewhere classified: Secondary | ICD-10-CM

## 2017-09-28 DIAGNOSIS — F17210 Nicotine dependence, cigarettes, uncomplicated: Secondary | ICD-10-CM

## 2017-09-28 DIAGNOSIS — R918 Other nonspecific abnormal finding of lung field: Secondary | ICD-10-CM

## 2017-09-28 DIAGNOSIS — I10 Essential (primary) hypertension: Secondary | ICD-10-CM

## 2017-09-28 DIAGNOSIS — Z800 Family history of malignant neoplasm of digestive organs: Secondary | ICD-10-CM

## 2017-09-28 DIAGNOSIS — M1990 Unspecified osteoarthritis, unspecified site: Secondary | ICD-10-CM

## 2017-09-28 DIAGNOSIS — E871 Hypo-osmolality and hyponatremia: Secondary | ICD-10-CM

## 2017-09-28 LAB — BASIC METABOLIC PANEL
ANION GAP: 10 (ref 5–15)
ANION GAP: 9 (ref 5–15)
ANION GAP: 7 (ref 5–15)
BUN: 11 mg/dL (ref 6–20)
BUN: 11 mg/dL (ref 6–20)
BUN: 11 mg/dL (ref 6–20)
CALCIUM: 8.3 mg/dL — AB (ref 8.9–10.3)
CHLORIDE: 84 mmol/L — AB (ref 101–111)
CO2: 27 mmol/L (ref 22–32)
CO2: 26 mmol/L (ref 22–32)
CO2: 28 mmol/L (ref 22–32)
Calcium: 8.3 mg/dL — ABNORMAL LOW (ref 8.9–10.3)
Calcium: 8.2 mg/dL — ABNORMAL LOW (ref 8.9–10.3)
Chloride: 84 mmol/L — ABNORMAL LOW (ref 101–111)
Chloride: 84 mmol/L — ABNORMAL LOW (ref 101–111)
Creatinine, Ser: 0.6 mg/dL — ABNORMAL LOW (ref 0.61–1.24)
Creatinine, Ser: 0.58 mg/dL — ABNORMAL LOW (ref 0.61–1.24)
Creatinine, Ser: 0.57 mg/dL — ABNORMAL LOW (ref 0.61–1.24)
GFR calc Af Amer: >60 mL/min (ref >60)
GFR calc Af Amer: >60 mL/min (ref >60)
GFR calc Af Amer: >60 mL/min (ref >60)
GFR calc non Af Amer: >60 mL/min (ref >60)
GFR calc non Af Amer: >60 mL/min (ref >60)
GFR calc non Af Amer: >60 mL/min (ref >60)
GLUCOSE: 170 mg/dL — AB (ref 65–99)
GLUCOSE: 152 mg/dL — AB (ref 65–99)
Glucose, Bld: 164 mg/dL — ABNORMAL HIGH (ref 65–99)
POTASSIUM: 4.3 mmol/L (ref 3.5–5.1)
POTASSIUM: 4.5 mmol/L (ref 3.5–5.1)
POTASSIUM: 4.4 mmol/L (ref 3.5–5.1)
Sodium: 121 mmol/L — ABNORMAL LOW (ref 135–145)
Sodium: 119 mmol/L — CL (ref 135–145)
Sodium: 119 mmol/L — CL (ref 135–145)

## 2017-09-28 LAB — PROTIME-INR
INR: 1.09
PROTHROMBIN TIME: 14 s (ref 11.4–15.2)

## 2017-09-28 LAB — COMPREHENSIVE METABOLIC PANEL
ALBUMIN: 2.7 g/dL — AB (ref 3.5–5.0)
ALT: 176 U/L — ABNORMAL HIGH (ref 17–63)
ANION GAP: 8 (ref 5–15)
AST: 103 U/L — ABNORMAL HIGH (ref 15–41)
Alkaline Phosphatase: 145 U/L — ABNORMAL HIGH (ref 38–126)
BILIRUBIN TOTAL: 11.1 mg/dL — AB (ref 0.3–1.2)
BUN: 11 mg/dL (ref 6–20)
CHLORIDE: 83 mmol/L — AB (ref 101–111)
CO2: 28 mmol/L (ref 22–32)
Calcium: 8.2 mg/dL — ABNORMAL LOW (ref 8.9–10.3)
Creatinine, Ser: 0.56 mg/dL — ABNORMAL LOW (ref 0.61–1.24)
GFR calc Af Amer: >60 mL/min (ref >60)
GFR calc non Af Amer: >60 mL/min (ref >60)
GLUCOSE: 119 mg/dL — AB (ref 65–99)
POTASSIUM: 4.4 mmol/L (ref 3.5–5.1)
Sodium: 119 mmol/L — CL (ref 135–145)
TOTAL PROTEIN: 6.2 g/dL — AB (ref 6.5–8.1)

## 2017-09-28 LAB — CBC
HEMATOCRIT: 41.6 % (ref 39.0–52.0)
HEMOGLOBIN: 14.7 g/dL (ref 13.0–17.0)
MCH: 31.1 pg (ref 26.0–34.0)
MCHC: 35.3 g/dL (ref 30.0–36.0)
MCV: 88.1 fL (ref 78.0–100.0)
Platelets: 127 10*3/uL — ABNORMAL LOW (ref 150–400)
RBC: 4.72 MIL/uL (ref 4.22–5.81)
RDW: 13.8 % (ref 11.5–15.5)
WBC: 9.5 10*3/uL (ref 4.0–10.5)

## 2017-09-28 LAB — SODIUM, URINE, RANDOM: SODIUM UR: 42 mmol/L

## 2017-09-28 LAB — MRSA PCR SCREENING: MRSA by PCR: NEGATIVE

## 2017-09-28 LAB — OSMOLALITY: OSMOLALITY: 252 mosm/kg — AB (ref 275–295)

## 2017-09-28 LAB — URINALYSIS, ROUTINE W REFLEX MICROSCOPIC
Bacteria, UA: NONE SEEN
Glucose, UA: 50 mg/dL — AB
KETONES UR: NEGATIVE mg/dL
LEUKOCYTES UA: NEGATIVE
Nitrite: NEGATIVE
PROTEIN: 100 mg/dL — AB
Specific Gravity, Urine: 1.017 (ref 1.005–1.030)
pH: 5 (ref 5.0–8.0)

## 2017-09-28 LAB — GLUCOSE, CAPILLARY
GLUCOSE-CAPILLARY: 137 mg/dL — AB (ref 65–99)
Glucose-Capillary: 162 mg/dL — ABNORMAL HIGH (ref 65–99)
Glucose-Capillary: 124 mg/dL — ABNORMAL HIGH (ref 65–99)
Glucose-Capillary: 145 mg/dL — ABNORMAL HIGH (ref 65–99)
Glucose-Capillary: 192 mg/dL — ABNORMAL HIGH (ref 65–99)
Glucose-Capillary: 155 mg/dL — ABNORMAL HIGH (ref 65–99)

## 2017-09-28 LAB — HIV ANTIBODY (ROUTINE TESTING W REFLEX): HIV Screen 4th Generation wRfx: NONREACTIVE

## 2017-09-28 LAB — CREATININE, URINE, RANDOM: Creatinine, Urine: 141.02 mg/dL

## 2017-09-28 LAB — TSH: TSH: 1.904 u[IU]/mL (ref 0.350–4.500)

## 2017-09-28 LAB — PHOSPHORUS: PHOSPHORUS: 2.8 mg/dL (ref 2.5–4.6)

## 2017-09-28 LAB — MAGNESIUM: MAGNESIUM: 1.6 mg/dL — AB (ref 1.7–2.4)

## 2017-09-28 LAB — HEMOGLOBIN A1C
Hgb A1c MFr Bld: 7.2 % — ABNORMAL HIGH (ref 4.8–5.6)
Mean Plasma Glucose: 159.94 mg/dL

## 2017-09-28 LAB — ECHOCARDIOGRAM COMPLETE
HEIGHTINCHES: 75 in
Weight: 4960 oz

## 2017-09-28 LAB — OSMOLALITY, URINE: OSMOLALITY UR: 532 mosm/kg (ref 300–900)

## 2017-09-28 LAB — PREALBUMIN: Prealbumin: 6.6 mg/dL — ABNORMAL LOW (ref 18–38)

## 2017-09-28 LAB — CK: CK TOTAL: 98 U/L (ref 49–397)

## 2017-09-28 MED ORDER — INSULIN ASPART 100 UNIT/ML ~~LOC~~ SOLN: 0.0000 [IU] | Freq: Three times a day (TID) | SUBCUTANEOUS | Status: DC

## 2017-09-28 MED ORDER — IRBESARTAN 300 MG PO TABS
300.0000 mg | ORAL_TABLET | Freq: Every day | ORAL | Status: DC
  Administered 2017-09-28 – 2017-09-29 (×2): 300 mg via ORAL
  Filled 2017-09-28 (×2): qty 1

## 2017-09-28 MED ORDER — OMEGA-3-ACID ETHYL ESTERS 1 G PO CAPS
1.0000 g | ORAL_CAPSULE | Freq: Two times a day (BID) | ORAL | Status: DC
  Administered 2017-09-28 – 2017-10-08 (×21): 1 g via ORAL
  Filled 2017-09-28 (×21): qty 1

## 2017-09-28 MED ORDER — POTASSIUM & SODIUM PHOSPHATES 280-160-250 MG PO PACK: 1.0000 | PACK | Freq: Three times a day (TID) | ORAL | Status: DC

## 2017-09-28 MED ORDER — BISACODYL 10 MG RE SUPP
10.0000 mg | Freq: Every day | RECTAL | Status: DC
  Administered 2017-09-28 – 2017-10-01 (×2): 10 mg via RECTAL
  Filled 2017-09-28 (×3): qty 1

## 2017-09-28 MED ORDER — INSULIN ASPART 100 UNIT/ML ~~LOC~~ SOLN
0.0000 [IU] | Freq: Every day | SUBCUTANEOUS | Status: DC
  Administered 2017-10-04: 3 [IU] via SUBCUTANEOUS
  Administered 2017-10-05: 4 [IU] via SUBCUTANEOUS

## 2017-09-28 MED ORDER — INSULIN ASPART 100 UNIT/ML ~~LOC~~ SOLN
0.0000 [IU] | Freq: Three times a day (TID) | SUBCUTANEOUS | Status: DC
  Administered 2017-09-28: 3 [IU] via SUBCUTANEOUS
  Administered 2017-09-28: 2 [IU] via SUBCUTANEOUS
  Administered 2017-09-29 (×2): 3 [IU] via SUBCUTANEOUS
  Administered 2017-09-29: 5 [IU] via SUBCUTANEOUS
  Administered 2017-09-30 (×2): 3 [IU] via SUBCUTANEOUS
  Administered 2017-09-30 – 2017-10-01 (×2): 2 [IU] via SUBCUTANEOUS
  Administered 2017-10-01: 3 [IU] via SUBCUTANEOUS
  Administered 2017-10-01: 2 [IU] via SUBCUTANEOUS
  Administered 2017-10-02: 3 [IU] via SUBCUTANEOUS
  Administered 2017-10-02: 2 [IU] via SUBCUTANEOUS
  Administered 2017-10-02: 3 [IU] via SUBCUTANEOUS
  Administered 2017-10-03: 5 [IU] via SUBCUTANEOUS
  Administered 2017-10-03 (×2): 3 [IU] via SUBCUTANEOUS
  Administered 2017-10-04 (×2): 11 [IU] via SUBCUTANEOUS
  Administered 2017-10-04: 2 [IU] via SUBCUTANEOUS
  Administered 2017-10-05: 3 [IU] via SUBCUTANEOUS
  Administered 2017-10-05: 11 [IU] via SUBCUTANEOUS
  Administered 2017-10-05: 8 [IU] via SUBCUTANEOUS
  Administered 2017-10-06 (×3): 11 [IU] via SUBCUTANEOUS
  Administered 2017-10-07 (×2): 8 [IU] via SUBCUTANEOUS
  Administered 2017-10-07: 15 [IU] via SUBCUTANEOUS
  Administered 2017-10-08 (×3): 8 [IU] via SUBCUTANEOUS

## 2017-09-28 MED ORDER — IPRATROPIUM-ALBUTEROL 0.5-2.5 (3) MG/3ML IN SOLN
3.0000 mL | Freq: Four times a day (QID) | RESPIRATORY_TRACT | Status: DC
  Administered 2017-09-28: 3 mL via RESPIRATORY_TRACT
  Filled 2017-09-28: qty 3

## 2017-09-28 MED ORDER — NICOTINE 21 MG/24HR TD PT24
21.0000 mg | MEDICATED_PATCH | Freq: Every day | TRANSDERMAL | Status: DC
  Administered 2017-09-28 – 2017-10-08 (×11): 21 mg via TRANSDERMAL
  Filled 2017-09-28 (×11): qty 1

## 2017-09-28 MED ORDER — MAGNESIUM SULFATE 2 GM/50ML IV SOLN
2.0000 g | Freq: Once | INTRAVENOUS | Status: AC
  Administered 2017-09-28: 2 g via INTRAVENOUS
  Filled 2017-09-28: qty 50

## 2017-09-28 MED ORDER — IPRATROPIUM-ALBUTEROL 0.5-2.5 (3) MG/3ML IN SOLN
3.0000 mL | Freq: Two times a day (BID) | RESPIRATORY_TRACT | Status: DC
  Administered 2017-09-28 – 2017-10-01 (×7): 3 mL via RESPIRATORY_TRACT
  Filled 2017-09-28 (×7): qty 3

## 2017-09-28 NOTE — Progress Notes (Signed)
PROGRESS NOTE    Robert Compton   WUJ:[id number]  DOB: Apr 05, 1946  DOA: 09/27/2017 PCP: [name], MD   Brief Narrative:  Robert Compton is a 72 y/o male with DM, HTN, obesity, smoking who presents for severe fatigue and is found to have a sodium level of 117.   He was seen in the ED on 09/20/17 for abdominal distension and hematuria dn under went a CT renal stone study which found liver masses. She was followed up by a CT chest/abd/pelvis with contrast by his PCP which found liver masses and a right apical lung mass with lymphadenopathy. He is admitted for hyponatremia  Subjective: No complaints. Noted to be coughing and is congested. No h/o COPD. States he smoked 2 ppd and cough is routine for him. ROS: no complaints of nausea, vomiting, constipation diarrhea, cough, dyspnea or dysuria. No other complaints.   Assessment & Plan:   Active Problems:   Hyponatremia - suspect SIADH- sodium is 119 today- have started fluid restriction of 1 L today- follow sodium levels closely and cont to follow in SDU    Metastatic cancer to liver- Right apical lung mass- elevated LFTs - suspecting lung CA to be the source- Oncology would like a liver biopsy- MRI brain ordered today to r/u brain mets  Abdominal pain - for which he came to the ED last week and was prescribed MS Contin- he states that he took 1 pill and "almost died" and did not take more- no significant pain today    DM (diabetes mellitus), type 2  A1c is 7.2 - holding metformin in the hospital- cont ISS  Nicotine abuse - counseled nicoderm patch ordered  Severe constipation - he states he has not had a BM In days- Dulcolax suppository ord  Hematuria - small amount on Hemoglobin noted on UA but no RBCs seen  Cough/ wheezing - probably COPD- start Nebs and Mucinex    Essential hypertension - Avapro on hold - will resume as BP is elevated   DVT prophylaxis: SCDs Code Status: Full code Family Communication:    Disposition Plan: follow in SDU until sodium improves Consultants:   oncology Procedures:   none Antimicrobials:  Anti-infectives (From admission, onward)   None       Objective: Vitals:   09/28/17 0500 09/28/17 0600 09/28/17 0816 09/28/17 0834  BP: (!) 160/71 (!) 147/74    Pulse: 85 79    Resp: 19 17    Temp:   98.1 F (36.7 C)   TempSrc:   Oral   SpO2: 91% (!) 89%  96%  Weight:      Height:        Intake/Output Summary (Last 24 hours) at 09/28/2017 1056 Last data filed at 09/28/2017 0600 Gross per 24 hour  Intake 1583.33 ml  Output 600 ml  Net 983.33 ml   Filed Weights   09/27/17 1844  Weight: (!) 140.6 kg (310 lb)    Examination: General exam: Appears comfortable  HEENT: PERRLA, oral mucosa moist, no sclera icterus or thrush Respiratory system: Clear to auscultation. Respiratory effort normal. Cardiovascular system: S1 & S2 heard, RRR.   Gastrointestinal system: Abdomen soft, obese, non-tender, nondistended. Normal bowel sound. No organomegaly Central nervous system: Alert and oriented. No focal neurological deficits. Extremities: No cyanosis, clubbing or edema Skin: No rashes or ulcers Psychiatry:  Mood & affect appropriate.     Data Reviewed: I have personally reviewed following labs and imaging studies  CBC: Recent Labs  Lab 09/27/17 2000  09/28/17 0207  WBC 10.1 9.5  NEUTROABS 8.1*  --   HGB 16.5 14.7  HCT 44.8 41.6  MCV 87.5 88.1  PLT 134* 400*   Basic Metabolic Panel: Recent Labs  Lab 09/27/17 2000 09/28/17 0207 09/28/17 0754  NA 117* 121* 119*  K 4.7 4.3 4.4  CL 79* 84* 83*  CO2 27 27 28   GLUCOSE 143* 164* 119*  BUN 9 11 11   CREATININE 0.55* 0.60* 0.56*  CALCIUM 8.8* 8.3* 8.2*  MG 1.8 1.6*  --   PHOS  --  2.8  --    GFR: Estimated Creatinine Clearance: 129.9 mL/min (A) (by C-G formula based on SCr of 0.56 mg/dL (L)). Liver Function Tests: Recent Labs  Lab 09/27/17 2000 09/28/17 0754  AST 133* 103*  ALT 224* 176*   ALKPHOS 156* 145*  BILITOT 12.3* 11.1*  PROT 6.9 6.2*  ALBUMIN 3.1* 2.7*   Recent Labs  Lab 09/27/17 2000  LIPASE 85*   Recent Labs  Lab 09/27/17 2316  AMMONIA 33   Coagulation Profile: Recent Labs  Lab 09/28/17 0005  INR 1.09   Cardiac Enzymes: Recent Labs  Lab 09/27/17 2000 09/28/17 0207  CKTOTAL  --  69  TROPONINI <0.03  --    BNP (last 3 results) No results for input(s): PROBNP in the last 8760 hours. HbA1C: Recent Labs    09/28/17 0207  HGBA1C 7.2*   CBG: Recent Labs  Lab 09/28/17 0003 09/28/17 0323 09/28/17 0753  GLUCAP 137* 162* 124*   Lipid Profile: No results for input(s): CHOL, HDL, LDLCALC, TRIG, CHOLHDL, LDLDIRECT in the last 72 hours. Thyroid Function Tests: Recent Labs    09/28/17 0207  TSH 1.904   Anemia Panel: No results for input(s): VITAMINB12, FOLATE, FERRITIN, TIBC, IRON, RETICCTPCT in the last 72 hours. Urine analysis:    Component Value Date/Time   COLORURINE AMBER (A) 09/28/2017 0030   APPEARANCEUR CLEAR 09/28/2017 0030   LABSPEC 1.017 09/28/2017 0030   PHURINE 5.0 09/28/2017 0030   GLUCOSEU 50 (A) 09/28/2017 0030   HGBUR SMALL (A) 09/28/2017 0030   BILIRUBINUR MODERATE (A) 09/28/2017 0030   KETONESUR NEGATIVE 09/28/2017 0030   PROTEINUR 100 (A) 09/28/2017 0030   NITRITE NEGATIVE 09/28/2017 0030   LEUKOCYTESUR NEGATIVE 09/28/2017 0030   Sepsis Labs: @LABRCNTIP (procalcitonin:4,lacticidven:4) ) Recent Results (from the past 240 hour(s))  MRSA PCR Screening     Status: None   Collection Time: 09/28/17 12:00 AM  Result Value Ref Range Status   MRSA by PCR NEGATIVE NEGATIVE Final    Comment:        The GeneXpert MRSA Assay (FDA approved for NASAL specimens only), is one component of a comprehensive MRSA colonization surveillance program. It is not intended to diagnose MRSA infection nor to guide or monitor treatment for MRSA infections. Performed at [facility], [address],  [place], [place] [address]          Radiology Studies: Dg Chest Port 1 View  Result Date: 09/27/2017 CLINICAL DATA:  Weakness. EXAM: PORTABLE CHEST 1 VIEW COMPARISON:  None. FINDINGS: The heart size is borderline to mildly enlarged. The hila, mediastinum, lungs, and pleura are unremarkable. IMPRESSION: No active disease. Electronically Signed   By: Dorise Bullion III M.D   On: 09/27/2017 19:49      Scheduled Meds: . bisacodyl  10 mg Rectal Daily  . famotidine  10 mg Oral Daily  . fish oil-omega-3 fatty acids  1 g Oral BID  . guaiFENesin  600  mg Oral BID  . insulin aspart  0-15 Units Subcutaneous TID WC  . insulin aspart  0-5 Units Subcutaneous QHS  . ipratropium-albuterol  3 mL Nebulization Q6H  . irbesartan  300 mg Oral Daily  . nicotine  21 mg Transdermal Daily  . senna  1 tablet Oral BID   Continuous Infusions:   LOS: 1 day    Time spent in minutes: 35    Debbe Odea, MD [facility] Pager: [email or web address] Password [facility] 09/28/2017, 10:56 AM

## 2017-09-28 NOTE — H&P (Signed)
Chief Complaint: Liver mass  Referring Physician(s): Mohamed  Supervising Physician: Arne Cleveland  Patient Status: [facility] - In-pt  History of Present Illness: Robert Compton is a 72 y.o. male with long history of heavy smoking and past medical history significant for osteoarthritis, diabetes mellitus and hypertension.    He presented to the ED at [facility] on 09/27/17 complaining of generalized weakness and fatigue as well as persistent nausea.    CT scan of the chest, abdomen and pelvis were ordered by his primary care physician and performed on 09/23/2017 and it showed irregular mass within the right lung apex measuring 3.0 x 2.0 cm.  The scan also showed multiple masses within the right liver lobe, 3 separate lesions with largest measuring 2.7 cm, additional 1.4 cm lesion at the inferior aspect of the right liver lobe.    He was admitted last night for further evaluation and recommendation regarding his condition.    Currently he denies having any current chest pain but does have shortness of breath with exertion.  He denies cough or hemoptysis.  He denies having any fever or chills.  He denies headache or visual changes.  We are asked to perform an image guided biopsy of a liver mass.   Past Medical History:  Diagnosis Date  . Arthritis   . Cancer ([place])   . Diabetes mellitus without complication ([place])   . Hypertension     Past Surgical History:  Procedure Laterality Date  . JOINT REPLACEMENT Right    knee    Allergies: Amoxicillin-pot clavulanate and Nitrofurantoin  Medications: Prior to Admission medications   Medication Sig Start Date End Date Taking? Authorizing Provider  aspirin 81 MG tablet Take 81 mg by mouth 2 (two) times daily.   Yes [provider]  Cinnamon 500 MG capsule Take 500 mg by mouth daily.   Yes [provider]  fish oil-omega-3 fatty acids 1000 MG capsule Take 1 g by mouth 2 (two) times daily.   Yes [provider]  fluticasone (FLONASE) 50 MCG/ACT nasal spray Place 1 spray into both nostrils daily as needed for allergies. 07/02/17  Yes [provider]  irbesartan (AVAPRO) 300 MG tablet Take 300 mg by mouth daily. 07/26/17  Yes [provider]  metFORMIN (GLUCOPHAGE) 1000 MG tablet Take 1,000 mg by mouth 2 (two) times daily with a meal.   Yes [provider]  morphine (MSIR) 15 MG tablet Take 1 tablet (15 mg total) by mouth every 4 (four) hours as needed for severe pain. 09/20/17  Yes Deno Etienne, DO  ondansetron (ZOFRAN ODT) 4 MG disintegrating tablet 4mg  ODT q4 hours prn nausea/vomit 09/20/17  Yes Deno Etienne, DO  ranitidine (ZANTAC) 150 MG tablet Take 150 mg by mouth 2 (two) times daily. For 14 doses 09/16/17 09/27/17 Yes [provider]  Red Yeast Rice 600 MG TABS Take 1 tablet by mouth daily.   Yes [provider]     Family History  Problem Relation Age of Onset  . Diabetes Mother   . Colon cancer Father   . CAD Neg Hx     Social History   Socioeconomic History  . Marital status: Single    Spouse name: Not on file  . Number of children: Not on file  . Years of education: Not on file  . Highest education level: Not on file  Occupational History  . Occupation: [employer] operational officer  . Financial resource strain: Not  on file  . Food insecurity:    Worry: Not on file    Inability: Not on file  . Transportation needs:    Medical: Not on file    Non-medical: Not on file  Tobacco Use  . Smoking status: Current Every Day Smoker    Packs/day: 1.50    Years: 8.00    Pack years: 12.00    Types: Cigarettes  . Smokeless tobacco: Never Used  Substance and Sexual Activity  . Alcohol use: No  . Drug use: No  . Sexual activity: Not on file  Lifestyle  . Physical activity:    Days per week: Not on file    Minutes per session: Not on file  . Stress: Not on file  Relationships  . Social connections:    Talks on phone: Not on file     Gets together: Not on file    Attends religious service: Not on file    Active member of club or organization: Not on file    Attends meetings of clubs or organizations: Not on file    Relationship status: Not on file  Other Topics Concern  . Not on file  Social History Narrative  . Not on file     Review of Systems: A 12 point ROS discussed and pertinent positives are indicated in the HPI above.  All other systems are negative. Review of Systems  Vital Signs: BP (!) 147/74   Pulse 79   Temp 98 F (36.7 C) (Oral)   Resp 17   Ht 6\' 3"  (1.905 m)   Wt (!) 310 lb (140.6 kg)   SpO2 96%   BMI 38.75 kg/m   Physical Exam  Constitutional:  Obese, NAD  HENT:  Head: Normocephalic.  Eyes: EOM are normal.  Neck: Normal range of motion.  Cardiovascular: Normal rate, regular rhythm and normal heart sounds.  Pulmonary/Chest: Effort normal and breath sounds normal. No respiratory distress.  Abdominal:  Obese, soft, NT  Skin: Skin is warm and dry.  Psychiatric: He has a normal mood and affect. His behavior is normal. Judgment and thought content normal.  Vitals reviewed.   Imaging: Ct Chest W Contrast  Result Date: 09/23/2017 CLINICAL DATA:  Evaluate for primary. Liver mass seen on CT abdomen and pelvis without contrast. EXAM: CT CHEST, ABDOMEN, AND PELVIS WITH CONTRAST TECHNIQUE: Multidetector CT imaging of the chest, abdomen and pelvis was performed following the standard protocol during bolus administration of intravenous contrast. CONTRAST:  127mL ISOVUE-300 IOPAMIDOL (ISOVUE-300) INJECTION 61% COMPARISON:  CT abdomen dated 09/20/2017. FINDINGS: CT CHEST FINDINGS Cardiovascular: Heart size is normal. No pericardial effusion. No thoracic aortic aneurysm. Scattered aortic atherosclerosis. Mediastinum/Nodes: Conglomerate lymphadenopathy within the RIGHT lower paratracheal space, largest component measuring 4.4 x 3.3 cm, with scattered satellite lymph nodes extending from the RIGHT upper  paratracheal space to the subcarinal space which are also likely indicative of metastatic disease. Esophagus appears normal. Trachea and central bronchi are unremarkable. Lungs/Pleura: Irregular mass within the RIGHT lung apex, measuring 3 cm craniocaudal dimension (coronal series 3, image 107) and 2 cm AP dimension (axial series 6, image 27). Two additional subpleural nodules are seen adjacent to the RIGHT minor fissure, measuring 10 mm and 8 mm (axial series 6, images 75 and 76 respectively). LEFT lung is clear.  No pleural effusion. Musculoskeletal: No acute or suspicious osseous findings. No convincing evidence of osseous metastasis. CT ABDOMEN PELVIS FINDINGS Hepatobiliary: Multiple masses within the RIGHT liver lobe, segment 6, 3 separate lesions with  largest measuring 2.7 cm. Additional 1.4 cm lesion at the inferior aspects of the RIGHT liver lobe cyst, likely segment 5. Gallbladder appears normal.  No bile duct dilatation. Pancreas: Unremarkable. No pancreatic ductal dilatation or surrounding inflammatory changes. Spleen: Normal in size without focal abnormality. Adrenals/Urinary Tract: LEFT renal cyst measures 4.4 cm. Both kidneys appear otherwise normal without suspicious mass, stone or hydronephrosis. Bladder appears normal. Stomach/Bowel: Bowel is normal in caliber. No bowel wall thickening or evidence of bowel wall inflammation seen. Stomach appears normal. Vascular/Lymphatic: No enlarged lymph nodes appreciated within the abdomen or pelvis. Aortic atherosclerosis. Reproductive: Prostate is unremarkable. Other: No free fluid or abscess collection. No soft tissue mass within the abdomen or pelvis. Musculoskeletal: No acute or suspicious osseous finding. No convincing evidence of osseous metastasis. IMPRESSION: 1. Multiple hypoenhancing masses within the RIGHT liver lobe, segments 5 and 6, largest lesion measuring 2.7 cm within segment 6, all consistent with hepatic metastases. 2. Conglomerate mediastinal  lymphadenopathy in the RIGHT paratracheal space, largest component within the lower RIGHT paratracheal space measuring 4.4 x 3.3 cm, with some degree of central necrosis, consistent with metastatic lymphadenopathy. 3. Irregular mass within the RIGHT lung apex, measuring 3 cm greatest dimension, suspicious for primary lung cancer, alternatively additional metastasis. 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 4. Additional chronic/incidental findings detailed above. The 2 subpleural pulmonary nodules within the RIGHT lower lobe could be benign or additional metastases, favor unrelated chronic/benign nodules. Electronically Signed   By: Franki Cabot M.D.   On: 09/23/2017 16:38   Ct Abdomen Pelvis W Contrast  Result Date: 09/23/2017 CLINICAL DATA:  Evaluate for primary. Liver mass seen on CT abdomen and pelvis without contrast. EXAM: CT CHEST, ABDOMEN, AND PELVIS WITH CONTRAST TECHNIQUE: Multidetector CT imaging of the chest, abdomen and pelvis was performed following the standard protocol during bolus administration of intravenous contrast. CONTRAST:  178mL ISOVUE-300 IOPAMIDOL (ISOVUE-300) INJECTION 61% COMPARISON:  CT abdomen dated 09/20/2017. FINDINGS: CT CHEST FINDINGS Cardiovascular: Heart size is normal. No pericardial effusion. No thoracic aortic aneurysm. Scattered aortic atherosclerosis. Mediastinum/Nodes: Conglomerate lymphadenopathy within the RIGHT lower paratracheal space, largest component measuring 4.4 x 3.3 cm, with scattered satellite lymph nodes extending from the RIGHT upper paratracheal space to the subcarinal space which are also likely indicative of metastatic disease. Esophagus appears normal. Trachea and central bronchi are unremarkable. Lungs/Pleura: Irregular mass within the RIGHT lung apex, measuring 3 cm craniocaudal dimension (coronal series 3, image 107) and 2 cm AP dimension (axial series 6, image 27). Two additional  subpleural nodules are seen adjacent to the RIGHT minor fissure, measuring 10 mm and 8 mm (axial series 6, images 75 and 76 respectively). LEFT lung is clear.  No pleural effusion. Musculoskeletal: No acute or suspicious osseous findings. No convincing evidence of osseous metastasis. CT ABDOMEN PELVIS FINDINGS Hepatobiliary: Multiple masses within the RIGHT liver lobe, segment 6, 3 separate lesions with largest measuring 2.7 cm. Additional 1.4 cm lesion at the inferior aspects of the RIGHT liver lobe cyst, likely segment 5. Gallbladder appears normal.  No bile duct dilatation. Pancreas: Unremarkable. No pancreatic ductal dilatation or surrounding inflammatory changes. Spleen: Normal in size without focal abnormality. Adrenals/Urinary Tract: LEFT renal cyst measures 4.4 cm. Both kidneys appear otherwise normal without suspicious mass, stone or hydronephrosis. Bladder appears normal. Stomach/Bowel: Bowel is normal in caliber. No bowel wall thickening or evidence of bowel wall inflammation seen. Stomach appears normal. Vascular/Lymphatic: No enlarged lymph nodes appreciated within the abdomen or  pelvis. Aortic atherosclerosis. Reproductive: Prostate is unremarkable. Other: No free fluid or abscess collection. No soft tissue mass within the abdomen or pelvis. Musculoskeletal: No acute or suspicious osseous finding. No convincing evidence of osseous metastasis. IMPRESSION: 1. Multiple hypoenhancing masses within the RIGHT liver lobe, segments 5 and 6, largest lesion measuring 2.7 cm within segment 6, all consistent with hepatic metastases. 2. Conglomerate mediastinal lymphadenopathy in the RIGHT paratracheal space, largest component within the lower RIGHT paratracheal space measuring 4.4 x 3.3 cm, with some degree of central necrosis, consistent with metastatic lymphadenopathy. 3. Irregular mass within the RIGHT lung apex, measuring 3 cm greatest dimension, suspicious for primary lung cancer, alternatively additional  metastasis. 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 4. Additional chronic/incidental findings detailed above. The 2 subpleural pulmonary nodules within the RIGHT lower lobe could be benign or additional metastases, favor unrelated chronic/benign nodules. Electronically Signed   By: Franki Cabot M.D.   On: 09/23/2017 16:38   Dg Chest Port 1 View  Result Date: 09/27/2017 CLINICAL DATA:  Weakness. EXAM: PORTABLE CHEST 1 VIEW COMPARISON:  None. FINDINGS: The heart size is borderline to mildly enlarged. The hila, mediastinum, lungs, and pleura are unremarkable. IMPRESSION: No active disease. Electronically Signed   By: Dorise Bullion III M.D   On: 09/27/2017 19:49   Ct Renal Stone Study  Result Date: 09/20/2017 CLINICAL DATA:  Acute onset of right-sided abdominal pain and hematuria. EXAM: CT ABDOMEN AND PELVIS WITHOUT CONTRAST TECHNIQUE: Multidetector CT imaging of the abdomen and pelvis was performed following the standard protocol without IV contrast. COMPARISON:  Abdominal ultrasound performed 08/04/2003 FINDINGS: Lower chest: Minimal atelectasis is noted at the lung bases. Scattered coronary artery calcifications are seen. Hepatobiliary: Multiple vague masses are seen within both hepatic lobes, measuring up to 3 cm in size, suspicious for metastatic disease to the liver. The gallbladder is decompressed and grossly unremarkable. The common bile duct is normal in caliber. Pancreas: The pancreas is within normal limits. Spleen: The spleen is unremarkable in appearance. Adrenals/Urinary Tract: The adrenal glands are unremarkable. A left renal cyst is noted. The kidneys are otherwise unremarkable in appearance. There is no evidence of hydronephrosis. No renal or ureteral stones are identified. No significant perinephric stranding is seen. Stomach/Bowel: The stomach is unremarkable in appearance. The small bowel is within normal limits. The appendix is  not visualized; there is no evidence for appendicitis. The colon is unremarkable in appearance. Vascular/Lymphatic: Scattered calcification is seen along the abdominal aorta and its branches. The abdominal aorta is otherwise grossly unremarkable. The inferior vena cava is grossly unremarkable. No retroperitoneal lymphadenopathy is seen. No pelvic sidewall lymphadenopathy is identified. Reproductive: The bladder is mildly distended and grossly unremarkable. The prostate remains normal in size. Other: No additional soft tissue abnormalities are seen. Musculoskeletal: No acute osseous abnormalities are identified. Vacuum phenomenon is noted at T11-T12. The visualized musculature is unremarkable in appearance. IMPRESSION: 1. Multiple vague masses within both hepatic lobes, measuring up to 3 cm in size, suspicious for metastatic disease to the liver. Would correlate with LFTs. PET/CT is recommended for further evaluation, to assess for underlying primary malignancy. 2. Scattered coronary artery calcifications seen. 3. Left renal cyst noted. Aortic Atherosclerosis (ICD10-I70.0). Electronically Signed   By: Garald Balding M.D.   On: 09/20/2017 22:46    Labs:  CBC: Recent Labs    09/20/17 1930 09/27/17 2000 09/28/17 0207  WBC 6.8 10.1 9.5  HGB 16.0 16.5 14.7  HCT 44.9  44.8 41.6  PLT 162 134* 127*    COAGS: Recent Labs    09/28/17 0005  INR 1.09    BMP: Recent Labs    09/20/17 1930 09/27/17 2000 09/28/17 0207 09/28/17 0754  NA 133* 117* 121* 119*  K 3.6 4.7 4.3 4.4  CL 96* 79* 84* 83*  CO2 26 27 27 28   GLUCOSE 190* 143* 164* 119*  BUN 14 9 11 11   CALCIUM 8.7* 8.8* 8.3* 8.2*  CREATININE 0.80 0.55* 0.60* 0.56*  GFRNONAA >60 >60 >60 >60  GFRAA >60 >60 >60 >60    LIVER FUNCTION TESTS: Recent Labs    09/20/17 1930 09/27/17 2000 09/28/17 0754  BILITOT 5.0* 12.3* 11.1*  AST 111* 133* 103*  ALT 168* 224* 176*  ALKPHOS 107 156* 145*  PROT 6.7 6.9 6.2*  ALBUMIN 3.4* 3.1* 2.7*     TUMOR MARKERS: No results for input(s): AFPTM, CEA, CA199, CHROMGRNA in the last 8760 hours.  Assessment and Plan:  Multiple masses within the right liver lobe with the largest measuring 2.7 cm.  Images reviewed by Dr. Earleen Newport.   Will proceed with image guided biopsy of the mass tentatively on Monday as long as the IR schedule allows.  Risks and benefits discussed with the patient including, but not limited to bleeding, infection, damage to adjacent structures or low yield requiring additional tests.  All of the patient's questions were answered, patient is agreeable to proceed. Consent signed and in chart.  Thank you for this interesting consult.  I greatly enjoyed meeting Robert Compton and look forward to participating in their care.  A copy of this report was sent to the requesting provider on this date.  Electronically Signed: Murrell Redden, PA-C   09/28/2017, 1:47 PM      I spent a total of 40 Minutes in face to face in clinical consultation, greater than 50% of which was counseling/coordinating care for Liver biopsy.

## 2017-09-28 NOTE — Progress Notes (Signed)
CRITICAL VALUE ALERT  Critical Value: Na 119   Date & Time Notified: 09/28/17 and 1454 Provider Notified: MD Rizwan at 1457  Orders Received/Actions taken: Will continue to monitor and assess. No new orders at this time.

## 2017-09-28 NOTE — Consult Note (Signed)
[place] [phone]   [phone]  CONSULT NOTE  REFERRING PHYSICIAN: Dr. Debbe Odea  REASON FOR CONSULTATION:  72 years old white male with highly suspicious lung cancer.  HPI Robert Compton is a 72 y.o. male with long history of heavy smoking and past medical history significant for osteoarthritis, diabetes mellitus and hypertension.  The patient presented to the emergency room at [facility] yesterday complaining of generalized weakness and fatigue as well as persistent nausea.  He was previously seen at the emergency department on September 20, 2017 with abdominal distention as well as hematuria.  CT of the abdomen for renal stone was performed at that time and that showed multiple vague masses in both hepatic lobes measuring up to 3.0 cm in size suspicious for metastatic disease to the liver.  CT scan of the chest, abdomen and pelvis were ordered by his primary care physician and performed on 09/23/2017 and it showed irregular mass within the right lung apex measuring 3.0 x 2.0 cm.  There was 2 additional subpleural nodules seen adjacent to the right minor fissure measuring 1.0 cm and 0.8 cm.  There was also a conglomerate lymphadenopathy within the right lower paratracheal space, largest component measuring 4.4 x 3.3 cm with a scattered satellite lymph nodes extending to the right upper paratracheal space to the subcarinal space indicative of metastatic disease.  The scan also showed multiple masses within the right liver lobe, 3 separate lesions with largest measuring 2.7 cm, additional 1.4 cm lesion at the inferior aspect of the right liver lobe.  The patient was admitted last night for further evaluation and recommendation regarding his condition.  When seen today he is feeling fine except for the increasing fatigue and weakness.  He denied having any current chest pain but has shortness of breath with exertion with no cough or hemoptysis.  He denied  having any fever or chills.  He has no headache or visual changes. Family history significant for father with colon cancer. The patient is married.  He works as a Administrator.  He has a history of his smoking up to 2 pack/day for around 50 years.  He is planning to quit smoking.  He has no history of alcohol or drug abuse.   HPI  Past Medical History:  Diagnosis Date  . Arthritis   . Cancer ([place])   . Diabetes mellitus without complication ([place])   . Hypertension     Past Surgical History:  Procedure Laterality Date  . JOINT REPLACEMENT Right    knee    Family History  Problem Relation Age of Onset  . Diabetes Mother   . Colon cancer Father   . CAD Neg Hx     Social History Social History   Tobacco Use  . Smoking status: Current Every Day Smoker    Packs/day: 1.50    Years: 8.00    Pack years: 12.00    Types: Cigarettes  . Smokeless tobacco: Never Used  Substance Use Topics  . Alcohol use: No  . Drug use: No    Allergies  Allergen Reactions  . Amoxicillin-Pot Clavulanate Anaphylaxis  . Nitrofurantoin Anaphylaxis    Current Facility-Administered Medications  Medication Dose Route Frequency Provider Last Rate Last Dose  . 0.9 %  sodium chloride infusion   Intravenous Continuous Doutova, Anastassia, MD 100 mL/hr at 09/28/17 0600    . albuterol (PROVENTIL) (2.5 MG/3ML) 0.083% nebulizer solution 2.5 mg  2.5 mg Nebulization Q2H PRN  [name], MD      . bisacodyl (DULCOLAX) suppository 10 mg  10 mg Rectal Daily PRN [name], MD      . bisacodyl (DULCOLAX) suppository 10 mg  10 mg Rectal Daily Rizwan, Eunice Blase, MD      . famotidine (PEPCID) tablet 10 mg  10 mg Oral Daily Doutova, Anastassia, MD      . guaiFENesin (MUCINEX) 12 hr tablet 600 mg  600 mg Oral BID [name], MD   600 mg at 09/28/17 0050  . insulin aspart (novoLOG) injection 0-15 Units  0-15 Units Subcutaneous TID WC Rizwan, Saima, MD      . insulin aspart (novoLOG) injection 0-5  Units  0-5 Units Subcutaneous QHS Rizwan, Saima, MD      . ipratropium-albuterol (DUONEB) 0.5-2.5 (3) MG/3ML nebulizer solution 3 mL  3 mL Nebulization Q6H Debbe Odea, MD   3 mL at 09/28/17 0833  . magnesium sulfate IVPB 2 g 50 mL  2 g Intravenous Once Rizwan, Eunice Blase, MD      . nicotine (NICODERM CQ - dosed in mg/24 hours) patch 21 mg  21 mg Transdermal Daily Rizwan, Eunice Blase, MD      . ondansetron (ZOFRAN) tablet 4 mg  4 mg Oral Q6H PRN Doutova, Anastassia, MD       Or  . ondansetron (ZOFRAN) injection 4 mg  4 mg Intravenous Q6H PRN Doutova, Anastassia, MD      . polyethylene glycol (MIRALAX / GLYCOLAX) packet 17 g  17 g Oral Daily PRN [name], MD      . senna (SENOKOT) tablet 8.6 mg  1 tablet Oral BID [name], MD   8.6 mg at 09/28/17 0050    Review of Systems  Constitutional: positive for anorexia and fatigue Eyes: negative Ears, nose, mouth, throat, and face: negative Respiratory: positive for dyspnea on exertion Cardiovascular: negative Gastrointestinal: positive for nausea Genitourinary:negative Integument/breast: negative Hematologic/lymphatic: negative Musculoskeletal:negative Neurological: negative Behavioral/Psych: negative Endocrine: negative Allergic/Immunologic: negative  Physical Exam  VPX:TGGYI, healthy, no distress, well nourished and well developed SKIN: skin color, texture, turgor are normal, no rashes or significant lesions HEAD: Normocephalic, No masses, lesions, tenderness or abnormalities EYES: normal, PERRLA, Conjunctiva are pink and non-injected EARS: External ears normal, Canals clear OROPHARYNX:no exudate and no erythema  NECK: supple, no adenopathy, no JVD LYMPH:  no palpable lymphadenopathy, no hepatosplenomegaly LUNGS: clear to auscultation , and palpation HEART: regular rate & rhythm, no murmurs and no gallops ABDOMEN:abdomen soft, non-tender, obese, normal bowel sounds and no masses or organomegaly BACK: Back symmetric, no  curvature., No CVA tenderness EXTREMITIES:no joint deformities, effusion, or inflammation, no edema  NEURO: alert & oriented x 3 with fluent speech, no focal motor/sensory deficits  PERFORMANCE STATUS: ECOG 1  LABORATORY DATA: Lab Results  Component Value Date   WBC 9.5 09/28/2017   HGB 14.7 09/28/2017   HCT 41.6 09/28/2017   MCV 88.1 09/28/2017   PLT 127 (L) 09/28/2017    @LASTCHEM @  RADIOGRAPHIC STUDIES: Ct Chest W Contrast  Result Date: 09/23/2017 CLINICAL DATA:  Evaluate for primary. Liver mass seen on CT abdomen and pelvis without contrast. EXAM: CT CHEST, ABDOMEN, AND PELVIS WITH CONTRAST TECHNIQUE: Multidetector CT imaging of the chest, abdomen and pelvis was performed following the standard protocol during bolus administration of intravenous contrast. CONTRAST:  173mL ISOVUE-300 IOPAMIDOL (ISOVUE-300) INJECTION 61% COMPARISON:  CT abdomen dated 09/20/2017. FINDINGS: CT CHEST FINDINGS Cardiovascular: Heart size is normal. No pericardial effusion. No thoracic aortic aneurysm. Scattered aortic atherosclerosis. Mediastinum/Nodes:  Conglomerate lymphadenopathy within the RIGHT lower paratracheal space, largest component measuring 4.4 x 3.3 cm, with scattered satellite lymph nodes extending from the RIGHT upper paratracheal space to the subcarinal space which are also likely indicative of metastatic disease. Esophagus appears normal. Trachea and central bronchi are unremarkable. Lungs/Pleura: Irregular mass within the RIGHT lung apex, measuring 3 cm craniocaudal dimension (coronal series 3, image 107) and 2 cm AP dimension (axial series 6, image 27). Two additional subpleural nodules are seen adjacent to the RIGHT minor fissure, measuring 10 mm and 8 mm (axial series 6, images 75 and 76 respectively). LEFT lung is clear.  No pleural effusion. Musculoskeletal: No acute or suspicious osseous findings. No convincing evidence of osseous metastasis. CT ABDOMEN PELVIS FINDINGS Hepatobiliary: Multiple  masses within the RIGHT liver lobe, segment 6, 3 separate lesions with largest measuring 2.7 cm. Additional 1.4 cm lesion at the inferior aspects of the RIGHT liver lobe cyst, likely segment 5. Gallbladder appears normal.  No bile duct dilatation. Pancreas: Unremarkable. No pancreatic ductal dilatation or surrounding inflammatory changes. Spleen: Normal in size without focal abnormality. Adrenals/Urinary Tract: LEFT renal cyst measures 4.4 cm. Both kidneys appear otherwise normal without suspicious mass, stone or hydronephrosis. Bladder appears normal. Stomach/Bowel: Bowel is normal in caliber. No bowel wall thickening or evidence of bowel wall inflammation seen. Stomach appears normal. Vascular/Lymphatic: No enlarged lymph nodes appreciated within the abdomen or pelvis. Aortic atherosclerosis. Reproductive: Prostate is unremarkable. Other: No free fluid or abscess collection. No soft tissue mass within the abdomen or pelvis. Musculoskeletal: No acute or suspicious osseous finding. No convincing evidence of osseous metastasis. IMPRESSION: 1. Multiple hypoenhancing masses within the RIGHT liver lobe, segments 5 and 6, largest lesion measuring 2.7 cm within segment 6, all consistent with hepatic metastases. 2. Conglomerate mediastinal lymphadenopathy in the RIGHT paratracheal space, largest component within the lower RIGHT paratracheal space measuring 4.4 x 3.3 cm, with some degree of central necrosis, consistent with metastatic lymphadenopathy. 3. Irregular mass within the RIGHT lung apex, measuring 3 cm greatest dimension, suspicious for primary lung cancer, alternatively additional metastasis. 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 4. Additional chronic/incidental findings detailed above. The 2 subpleural pulmonary nodules within the RIGHT lower lobe could be benign or additional metastases, favor unrelated chronic/benign nodules. Electronically Signed    By: Franki Cabot M.D.   On: 09/23/2017 16:38   Ct Abdomen Pelvis W Contrast  Result Date: 09/23/2017 CLINICAL DATA:  Evaluate for primary. Liver mass seen on CT abdomen and pelvis without contrast. EXAM: CT CHEST, ABDOMEN, AND PELVIS WITH CONTRAST TECHNIQUE: Multidetector CT imaging of the chest, abdomen and pelvis was performed following the standard protocol during bolus administration of intravenous contrast. CONTRAST:  116mL ISOVUE-300 IOPAMIDOL (ISOVUE-300) INJECTION 61% COMPARISON:  CT abdomen dated 09/20/2017. FINDINGS: CT CHEST FINDINGS Cardiovascular: Heart size is normal. No pericardial effusion. No thoracic aortic aneurysm. Scattered aortic atherosclerosis. Mediastinum/Nodes: Conglomerate lymphadenopathy within the RIGHT lower paratracheal space, largest component measuring 4.4 x 3.3 cm, with scattered satellite lymph nodes extending from the RIGHT upper paratracheal space to the subcarinal space which are also likely indicative of metastatic disease. Esophagus appears normal. Trachea and central bronchi are unremarkable. Lungs/Pleura: Irregular mass within the RIGHT lung apex, measuring 3 cm craniocaudal dimension (coronal series 3, image 107) and 2 cm AP dimension (axial series 6, image 27). Two additional subpleural nodules are seen adjacent to the RIGHT minor fissure, measuring 10 mm and 8 mm (axial series 6, images  75 and 76 respectively). LEFT lung is clear.  No pleural effusion. Musculoskeletal: No acute or suspicious osseous findings. No convincing evidence of osseous metastasis. CT ABDOMEN PELVIS FINDINGS Hepatobiliary: Multiple masses within the RIGHT liver lobe, segment 6, 3 separate lesions with largest measuring 2.7 cm. Additional 1.4 cm lesion at the inferior aspects of the RIGHT liver lobe cyst, likely segment 5. Gallbladder appears normal.  No bile duct dilatation. Pancreas: Unremarkable. No pancreatic ductal dilatation or surrounding inflammatory changes. Spleen: Normal in size  without focal abnormality. Adrenals/Urinary Tract: LEFT renal cyst measures 4.4 cm. Both kidneys appear otherwise normal without suspicious mass, stone or hydronephrosis. Bladder appears normal. Stomach/Bowel: Bowel is normal in caliber. No bowel wall thickening or evidence of bowel wall inflammation seen. Stomach appears normal. Vascular/Lymphatic: No enlarged lymph nodes appreciated within the abdomen or pelvis. Aortic atherosclerosis. Reproductive: Prostate is unremarkable. Other: No free fluid or abscess collection. No soft tissue mass within the abdomen or pelvis. Musculoskeletal: No acute or suspicious osseous finding. No convincing evidence of osseous metastasis. IMPRESSION: 1. Multiple hypoenhancing masses within the RIGHT liver lobe, segments 5 and 6, largest lesion measuring 2.7 cm within segment 6, all consistent with hepatic metastases. 2. Conglomerate mediastinal lymphadenopathy in the RIGHT paratracheal space, largest component within the lower RIGHT paratracheal space measuring 4.4 x 3.3 cm, with some degree of central necrosis, consistent with metastatic lymphadenopathy. 3. Irregular mass within the RIGHT lung apex, measuring 3 cm greatest dimension, suspicious for primary lung cancer, alternatively additional metastasis. 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 4. Additional chronic/incidental findings detailed above. The 2 subpleural pulmonary nodules within the RIGHT lower lobe could be benign or additional metastases, favor unrelated chronic/benign nodules. Electronically Signed   By: Franki Cabot M.D.   On: 09/23/2017 16:38   Dg Chest Port 1 View  Result Date: 09/27/2017 CLINICAL DATA:  Weakness. EXAM: PORTABLE CHEST 1 VIEW COMPARISON:  None. FINDINGS: The heart size is borderline to mildly enlarged. The hila, mediastinum, lungs, and pleura are unremarkable. IMPRESSION: No active disease. Electronically Signed   By: Dorise Bullion  III M.D   On: 09/27/2017 19:49   Ct Renal Stone Study  Result Date: 09/20/2017 CLINICAL DATA:  Acute onset of right-sided abdominal pain and hematuria. EXAM: CT ABDOMEN AND PELVIS WITHOUT CONTRAST TECHNIQUE: Multidetector CT imaging of the abdomen and pelvis was performed following the standard protocol without IV contrast. COMPARISON:  Abdominal ultrasound performed 08/04/2003 FINDINGS: Lower chest: Minimal atelectasis is noted at the lung bases. Scattered coronary artery calcifications are seen. Hepatobiliary: Multiple vague masses are seen within both hepatic lobes, measuring up to 3 cm in size, suspicious for metastatic disease to the liver. The gallbladder is decompressed and grossly unremarkable. The common bile duct is normal in caliber. Pancreas: The pancreas is within normal limits. Spleen: The spleen is unremarkable in appearance. Adrenals/Urinary Tract: The adrenal glands are unremarkable. A left renal cyst is noted. The kidneys are otherwise unremarkable in appearance. There is no evidence of hydronephrosis. No renal or ureteral stones are identified. No significant perinephric stranding is seen. Stomach/Bowel: The stomach is unremarkable in appearance. The small bowel is within normal limits. The appendix is not visualized; there is no evidence for appendicitis. The colon is unremarkable in appearance. Vascular/Lymphatic: Scattered calcification is seen along the abdominal aorta and its branches. The abdominal aorta is otherwise grossly unremarkable. The inferior vena cava is grossly unremarkable. No retroperitoneal lymphadenopathy is seen. No pelvic sidewall lymphadenopathy is  identified. Reproductive: The bladder is mildly distended and grossly unremarkable. The prostate remains normal in size. Other: No additional soft tissue abnormalities are seen. Musculoskeletal: No acute osseous abnormalities are identified. Vacuum phenomenon is noted at T11-T12. The visualized musculature is unremarkable in  appearance. IMPRESSION: 1. Multiple vague masses within both hepatic lobes, measuring up to 3 cm in size, suspicious for metastatic disease to the liver. Would correlate with LFTs. PET/CT is recommended for further evaluation, to assess for underlying primary malignancy. 2. Scattered coronary artery calcifications seen. 3. Left renal cyst noted. Aortic Atherosclerosis (ICD10-I70.0). Electronically Signed   By: Garald Balding M.D.   On: 09/20/2017 22:46    ASSESSMENT: This is a very pleasant 72 years old white male with highly suspicious extensive stage small cell lung cancer presented with right upper lobe lung mass in addition to few other pulmonary nodules and significant mediastinal lymphadenopathy and liver metastases.  Tissue diagnosis is a still pending. The patient also has significant hyponatremia consistent with the possibility of small cell lung cancer.  PLAN: I had a lengthy discussion with the patient today about his current condition and further investigation to confirm a diagnosis as well as potential treatment options. We will arrange for the patient to complete the work-up by ordering MRI of the brain to rule out brain metastasis. The patient will also benefit from ultrasound-guided core biopsy of 1 of the liver lesion for tissue diagnosis. Once his diagnosis is confirmed, the patient may benefit from palliative systemic chemotherapy with carboplatin, etoposide and Tecentriq if the final diagnosis is consistent with a small cell lung cancer. For the hyponatremia, this is likely SIADH secondary to his potential diagnosis of small cell lung cancer.  We will continue supportive care with fluid restriction as well as normal saline for now.  Demeclocycline would be also another treatment options until the patient start his systemic therapy for the small cell lung cancer if confirmed. The patient is in agreement with the current plan. The patient voices understanding of current disease status  and treatment options and is in agreement with the current care plan.  All questions were answered. The patient knows to call the clinic with any problems, questions or concerns. We can certainly see the patient much sooner if necessary.  Thank you so much for allowing me to participate in the care of Robert Compton. I will continue to follow up the patient with you and assist in his care.  Disclaimer: This note was dictated with voice recognition software. Similar sounding words can inadvertently be transcribed and may not be corrected upon review.   Eilleen Kempf September 28, 2017, 9:24 AM

## 2017-09-28 NOTE — Progress Notes (Signed)
PT Cancellation Note  Patient Details Name: Robert Compton MRN: [id number] DOB: 1945/12/13   Cancelled Treatment:    Reason Eval/Treat Not Completed: Medical issues which prohibited therapy, per RN.   Claretha Cooper 09/28/2017, 1:04 PM  Tresa Endo PT [phone]

## 2017-09-28 NOTE — Progress Notes (Signed)
  Echocardiogram 2D Echocardiogram has been performed.  Robert Compton M 09/28/2017, 7:54 AM

## 2017-09-28 NOTE — Progress Notes (Signed)
CRITICAL VALUE ALERT  Critical Value:  Na- 119  Date & Time Notied:   6/15 @2300   Provider Notified:

## 2017-09-29 ENCOUNTER — Other Ambulatory Visit: Payer: Self-pay

## 2017-09-29 DIAGNOSIS — R160 Hepatomegaly, not elsewhere classified: Secondary | ICD-10-CM

## 2017-09-29 LAB — URINE CULTURE: SPECIAL REQUESTS: NORMAL

## 2017-09-29 LAB — GLUCOSE, CAPILLARY
GLUCOSE-CAPILLARY: 140 mg/dL — AB (ref 65–99)
GLUCOSE-CAPILLARY: 146 mg/dL — AB (ref 65–99)
GLUCOSE-CAPILLARY: 161 mg/dL — AB (ref 65–99)
GLUCOSE-CAPILLARY: 167 mg/dL — AB (ref 65–99)
Glucose-Capillary: 191 mg/dL — ABNORMAL HIGH (ref 65–99)
Glucose-Capillary: 217 mg/dL — ABNORMAL HIGH (ref 65–99)

## 2017-09-29 LAB — BASIC METABOLIC PANEL
ANION GAP: 9 (ref 5–15)
Anion gap: 9 (ref 5–15)
Anion gap: 11 (ref 5–15)
BUN: 11 mg/dL (ref 6–20)
BUN: 11 mg/dL (ref 6–20)
BUN: 12 mg/dL (ref 6–20)
CALCIUM: 8.5 mg/dL — AB (ref 8.9–10.3)
CHLORIDE: 82 mmol/L — AB (ref 101–111)
CO2: 25 mmol/L (ref 22–32)
CO2: 26 mmol/L (ref 22–32)
CO2: 25 mmol/L (ref 22–32)
CREATININE: 0.54 mg/dL — AB (ref 0.61–1.24)
CREATININE: 0.6 mg/dL — AB (ref 0.61–1.24)
Calcium: 8.1 mg/dL — ABNORMAL LOW (ref 8.9–10.3)
Calcium: 8.3 mg/dL — ABNORMAL LOW (ref 8.9–10.3)
Chloride: 82 mmol/L — ABNORMAL LOW (ref 101–111)
Chloride: 82 mmol/L — ABNORMAL LOW (ref 101–111)
Creatinine, Ser: 0.36 mg/dL — ABNORMAL LOW (ref 0.61–1.24)
GFR calc Af Amer: >60 mL/min (ref >60)
GFR calc Af Amer: >60 mL/min (ref >60)
GFR calc non Af Amer: >60 mL/min (ref >60)
GLUCOSE: 149 mg/dL — AB (ref 65–99)
GLUCOSE: 170 mg/dL — AB (ref 65–99)
Glucose, Bld: 148 mg/dL — ABNORMAL HIGH (ref 65–99)
POTASSIUM: 5 mmol/L (ref 3.5–5.1)
Potassium: 4.6 mmol/L (ref 3.5–5.1)
Potassium: 4.6 mmol/L (ref 3.5–5.1)
SODIUM: 117 mmol/L — AB (ref 135–145)
Sodium: 116 mmol/L — CL (ref 135–145)
Sodium: 118 mmol/L — CL (ref 135–145)

## 2017-09-29 LAB — MAGNESIUM: Magnesium: 1.7 mg/dL (ref 1.7–2.4)

## 2017-09-29 LAB — PHOSPHORUS: Phosphorus: 2.6 mg/dL (ref 2.5–4.6)

## 2017-09-29 MED ORDER — DEMECLOCYCLINE HCL 150 MG PO TABS
300.0000 mg | ORAL_TABLET | Freq: Two times a day (BID) | ORAL | Status: DC
  Administered 2017-09-29 – 2017-10-08 (×19): 300 mg via ORAL
  Filled 2017-09-29 (×22): qty 2

## 2017-09-29 MED ORDER — DM-GUAIFENESIN ER 30-600 MG PO TB12
1.0000 | ORAL_TABLET | Freq: Two times a day (BID) | ORAL | Status: DC
  Administered 2017-09-29 – 2017-10-08 (×18): 1 via ORAL
  Filled 2017-09-29 (×19): qty 1

## 2017-09-29 MED ORDER — DICLOFENAC SODIUM 1 % TD GEL
2.0000 g | Freq: Four times a day (QID) | TRANSDERMAL | Status: DC
  Administered 2017-09-29 (×3): 2 g via TOPICAL
  Filled 2017-09-29: qty 100

## 2017-09-29 MED ORDER — TRAMADOL HCL 50 MG PO TABS
50.0000 mg | ORAL_TABLET | Freq: Four times a day (QID) | ORAL | Status: DC | PRN
  Administered 2017-09-29 – 2017-10-08 (×6): 50 mg via ORAL
  Filled 2017-09-29 (×6): qty 1

## 2017-09-29 MED ORDER — FUROSEMIDE 40 MG PO TABS
160.0000 mg | ORAL_TABLET | Freq: Two times a day (BID) | ORAL | Status: DC
  Administered 2017-09-29 – 2017-10-05 (×12): 160 mg via ORAL
  Filled 2017-09-29 (×12): qty 4

## 2017-09-29 MED ORDER — AMLODIPINE BESYLATE 5 MG PO TABS
5.0000 mg | ORAL_TABLET | Freq: Every day | ORAL | Status: DC
  Administered 2017-09-30 – 2017-10-02 (×4): 5 mg via ORAL
  Filled 2017-09-29 (×4): qty 1

## 2017-09-29 MED ORDER — HYDROCOD POLST-CPM POLST ER 10-8 MG/5ML PO SUER
5.0000 mL | Freq: Two times a day (BID) | ORAL | Status: DC
  Administered 2017-09-29 – 2017-10-02 (×8): 5 mL via ORAL
  Filled 2017-09-29 (×8): qty 5

## 2017-09-29 NOTE — Progress Notes (Signed)
Pt. Called for a breathing treatment stating that his lungs hurt from the oxygen via [place] the RN placed on patient earlier in the shift for desaturations.  Pt. Refuses to wear CPAP at home and in hospital and has desaturations with sleep.  Treatment given as requested.

## 2017-09-29 NOTE — Progress Notes (Signed)
Patient was out of bed today into chair for 1 hour. Pt tolerated activity well over all. Pt returned to bed d/t discomfort in lower back and buttocks. Will continue to encourage activity. Pt is a 2 assist with walker.

## 2017-09-29 NOTE — Consult Note (Signed)
Reason for Consult:Hyponatremia Referring Physician: Dr. Emilio Aspen Chilson is an 72 y.o. male.  HPI: 72 yr male admitted with weakness and fatigue.  Recent dx or metastatic Ca, ? Cell type, involving lung and liver.  S Na initially 121 and has fallen to 117 with admin of NS.  Hx HTn and Type 2 DM about 11 yr..  Hx DJD and taking Alleve. On Ibesartan for HTN.  No hx thyroid or adrenal abnormalities.  No recent chem.  Feels bad but does not feel thinking affected.   Constitutional: as above ,weak , feels bad Eyes: negative Ears, nose, mouth, throat, and face: negative Respiratory: cough with whitish phlegm,  100 pk yr Cardiovascular: negative Gastrointestinal: Indigest, HB Genitourinary:negative Integument/breast: negative Musculoskeletal:arth esp knees Neurological: negative Behavioral/Psych: negative Endocrine: DM Allergic/Immunologic: Augmentin, Nitrofurantoin   Past Medical History:  Diagnosis Date  . Arthritis   . Cancer ([place])   . Diabetes mellitus without complication ([place])   . Hypertension     Past Surgical History:  Procedure Laterality Date  . JOINT REPLACEMENT Right    knee    Family History  Problem Relation Age of Onset  . Diabetes Mother   . Colon cancer Father   . CAD Neg Hx     Social History:  reports that he has been smoking cigarettes.  He has a 12.00 pack-year smoking history. He has never used smokeless tobacco. He reports that he does not drink alcohol or use drugs.  Allergies:  Allergies  Allergen Reactions  . Amoxicillin-Pot Clavulanate Anaphylaxis  . Nitrofurantoin Anaphylaxis    Medications:  I have reviewed the patient's current medications. Prior to Admission:  Medications Prior to Admission  Medication Sig Dispense Refill Last Dose  . aspirin 81 MG tablet Take 81 mg by mouth 2 (two) times daily.   09/27/2017 at Unknown time  . Cinnamon 500 MG capsule Take 500 mg by mouth daily.   09/27/2017 at Unknown time  . fish oil-omega-3  fatty acids 1000 MG capsule Take 1 g by mouth 2 (two) times daily.   09/27/2017 at Unknown time  . fluticasone (FLONASE) 50 MCG/ACT nasal spray Place 1 spray into both nostrils daily as needed for allergies.  11 Past Week at Unknown time  . irbesartan (AVAPRO) 300 MG tablet Take 300 mg by mouth daily.  1 09/27/2017 at Unknown time  . metFORMIN (GLUCOPHAGE) 1000 MG tablet Take 1,000 mg by mouth 2 (two) times daily with a meal.   09/27/2017 at Unknown time  . morphine (MSIR) 15 MG tablet Take 1 tablet (15 mg total) by mouth every 4 (four) hours as needed for severe pain. 7 tablet 0 09/27/2017 at Unknown time  . ondansetron (ZOFRAN ODT) 4 MG disintegrating tablet 90m ODT q4 hours prn nausea/vomit 20 tablet 0 09/26/2017 at Unknown time  . ranitidine (ZANTAC) 150 MG tablet Take 150 mg by mouth 2 (two) times daily. For 14 doses     . Red Yeast Rice 600 MG TABS Take 1 tablet by mouth daily.   09/27/2017 at Unknown time    Results for orders placed or performed during the hospital encounter of 09/27/17 (from the past 48 hour(s))  CBC with Differential     Status: Abnormal   Collection Time: 09/27/17  8:00 PM  Result Value Ref Range   WBC 10.1 4.0 - 10.5 K/uL   RBC 5.12 4.22 - 5.81 MIL/uL   Hemoglobin 16.5 13.0 - 17.0 g/dL   HCT 44.8 39.0 - 52.0 %  MCV 87.5 78.0 - 100.0 fL   MCH 32.2 26.0 - 34.0 pg   MCHC 36.8 (H) 30.0 - 36.0 g/dL   RDW 13.8 11.5 - 15.5 %   Platelets 134 (L) 150 - 400 K/uL   Neutrophils Relative % 80 %   Neutro Abs 8.1 (H) 1.7 - 7.7 K/uL   Lymphocytes Relative 9 %   Lymphs Abs 0.9 0.7 - 4.0 K/uL   Monocytes Relative 10 %   Monocytes Absolute 1.0 0.1 - 1.0 K/uL   Eosinophils Relative 1 %   Eosinophils Absolute 0.1 0.0 - 0.7 K/uL   Basophils Relative 0 %   Basophils Absolute 0.0 0.0 - 0.1 K/uL    Comment: Performed at [facility], [place] [address] [place], [place] [address]  Troponin I     Status: None   Collection Time: 09/27/17  8:00 PM  Result Value Ref  Range   Troponin I <0.03 <0.03 ng/mL    Comment: Performed at [facility], [place] [address], [place], [place] [address]  Magnesium     Status: None   Collection Time: 09/27/17  8:00 PM  Result Value Ref Range   Magnesium 1.8 1.7 - 2.4 mg/dL    Comment: Performed at [facility], [place] [address] [place], [place] [address]  Lipase, blood     Status: Abnormal   Collection Time: 09/27/17  8:00 PM  Result Value Ref Range   Lipase 85 (H) 11 - 51 U/L    Comment: Performed at [facility], [place] [address], [place], [place] [address]  Comprehensive metabolic panel     Status: Abnormal   Collection Time: 09/27/17  8:00 PM  Result Value Ref Range   Sodium 117 (LL) 135 - 145 mmol/L    Comment: REPEATED TO VERIFY CRITICAL RESULT CALLED TO, READ BACK BY AND VERIFIED WITH: Annabell Howells [id number] @ 2113 BY J SCOTTON    Potassium 4.7 3.5 - 5.1 mmol/L   Chloride 79 (L) 101 - 111 mmol/L   CO2 27 22 - 32 mmol/L   Glucose, Bld 143 (H) 65 - 99 mg/dL   BUN 9 6 - 20 mg/dL   Creatinine, Ser 0.55 (L) 0.61 - 1.24 mg/dL   Calcium 8.8 (L) 8.9 - 10.3 mg/dL   Total Protein 6.9 6.5 - 8.1 g/dL   Albumin 3.1 (L) 3.5 - 5.0 g/dL   AST 133 (H) 15 - 41 U/L   ALT 224 (H) 17 - 63 U/L   Alkaline Phosphatase 156 (H) 38 - 126 U/L   Total Bilirubin 12.3 (H) 0.3 - 1.2 mg/dL   GFR calc non Af Amer >60 >60 mL/min   GFR calc Af Amer >60 >60 mL/min    Comment: (NOTE) The eGFR has been calculated using the CKD EPI equation. This calculation has not been validated in all clinical situations. eGFR's persistently <60 mL/min signify possible Chronic Kidney Disease.    Anion gap 11 5 - 15    Comment: Performed at [facility], [place] [address], [place], [place] [address]  Blood gas, venous     Status: Abnormal   Collection Time: 09/27/17 10:41 PM  Result Value Ref Range   pH, Ven 7.382 7.250 - 7.430   pCO2, Ven 48.1 44.0 - 60.0 mmHg   pO2, Ven  32.7 32.0 - 45.0 mmHg   Bicarbonate 27.9 20.0 - 28.0 mmol/L   Acid-Base Excess 2.4 (H) 0.0 - 2.0 mmol/L   O2  Saturation 60.1 %   Patient temperature 98.6    Collection site VEIN    Drawn by DRAWN BY RN    Sample type VEIN     Comment: Performed at [place] [address] [place], [place] [address]  Ammonia     Status: None   Collection Time: 09/27/17 11:16 PM  Result Value Ref Range   Ammonia 33 9 - 35 umol/L    Comment: Performed at [facility], [place] [address], [place], [place] [address]  Lactic acid, plasma     Status: None   Collection Time: 09/27/17 11:16 PM  Result Value Ref Range   Lactic Acid, Venous 1.4 0.5 - 1.9 mmol/L    Comment: Performed at [facility], [place] [address], [place], [place] [address]  MRSA PCR Screening     Status: None   Collection Time: 09/28/17 12:00 AM  Result Value Ref Range   MRSA by PCR NEGATIVE NEGATIVE    Comment:        The GeneXpert MRSA Assay (FDA approved for NASAL specimens only), is one component of a comprehensive MRSA colonization surveillance program. It is not intended to diagnose MRSA infection nor to guide or monitor treatment for MRSA infections. Performed at [facility], [place] [address], [place], [place] [address]   Glucose, capillary     Status: Abnormal   Collection Time: 09/28/17 12:03 AM  Result Value Ref Range   Glucose-Capillary 137 (H) 65 - 99 mg/dL   Comment 1 Notify RN    Comment 2 Document in Chart   Protime-INR     Status: None   Collection Time: 09/28/17 12:05 AM  Result Value Ref Range   Prothrombin Time 14.0 11.4 - 15.2 seconds   INR 1.09     Comment: Performed at [facility], [place] [address], [place], [place] [address]  Urinalysis, Routine w reflex microscopic     Status: Abnormal   Collection Time: 09/28/17 12:30 AM  Result Value Ref Range   Color, Urine AMBER (A) YELLOW    Comment: BIOCHEMICALS MAY BE  AFFECTED BY COLOR   APPearance CLEAR CLEAR   Specific Gravity, Urine 1.017 1.005 - 1.030   pH 5.0 5.0 - 8.0   Glucose, UA 50 (A) NEGATIVE mg/dL   Hgb urine dipstick SMALL (A) NEGATIVE   Bilirubin Urine MODERATE (A) NEGATIVE   Ketones, ur NEGATIVE NEGATIVE mg/dL   Protein, ur 100 (A) NEGATIVE mg/dL   Nitrite NEGATIVE NEGATIVE   Leukocytes, UA NEGATIVE NEGATIVE   RBC / HPF 0-5 0 - 5 RBC/hpf   WBC, UA 0-5 0 - 5 WBC/hpf   Bacteria, UA NONE SEEN NONE SEEN   Mucus PRESENT     Comment: Performed at [facility], [place] [address], [place], [place] [address]  Urine culture     Status: Abnormal   Collection Time: 09/28/17 12:30 AM  Result Value Ref Range   Specimen Description      URINE, CATHETERIZED Performed at [place] [address] [place], [place] [address]    Special Requests      Normal Performed at [facility], [place] [address] [place], [place] [address]    Culture (A)     <10,000 COLONIES/mL INSIGNIFICANT GROWTH Performed at [place] [address], [place], [place] [address]    Report Status 09/29/2017 FINAL   Sodium, urine, random     Status:  None   Collection Time: 09/28/17 12:30 AM  Result Value Ref Range   Sodium, Ur 42 mmol/L    Comment: Performed at [facility], [place] [address], [place], [place] [address]  Creatinine, urine, random     Status: None   Collection Time: 09/28/17 12:30 AM  Result Value Ref Range   Creatinine, Urine 141.02 mg/dL    Comment: Performed at [facility], [place] [address] [place], [place] [address]  Osmolality, urine     Status: None   Collection Time: 09/28/17 12:30 AM  Result Value Ref Range   Osmolality, Ur 532 300 - 900 mOsm/kg    Comment: Performed at [place] [address], [place], [place] [address]  Prealbumin     Status: Abnormal   Collection Time: 09/28/17  2:07 AM  Result Value Ref Range   Prealbumin 6.6  (L) 18 - 38 mg/dL    Comment: Performed at [place] [address], [place], [place] [address]  Hemoglobin A1c     Status: Abnormal   Collection Time: 09/28/17  2:07 AM  Result Value Ref Range   Hgb A1c MFr Bld 7.2 (H) 4.8 - 5.6 %    Comment: (NOTE) Pre diabetes:          5.7%-6.4% Diabetes:              >6.4% Glycemic control for   <7.0% adults with diabetes    Mean Plasma Glucose 159.94 mg/dL    Comment: Performed at [place] [address] [place], [place] [address]  Basic metabolic panel     Status: Abnormal   Collection Time: 09/28/17  2:07 AM  Result Value Ref Range   Sodium 121 (L) 135 - 145 mmol/L   Potassium 4.3 3.5 - 5.1 mmol/L   Chloride 84 (L) 101 - 111 mmol/L   CO2 27 22 - 32 mmol/L   Glucose, Bld 164 (H) 65 - 99 mg/dL   BUN 11 6 - 20 mg/dL   Creatinine, Ser 0.60 (L) 0.61 - 1.24 mg/dL   Calcium 8.3 (L) 8.9 - 10.3 mg/dL   GFR calc non Af Amer >60 >60 mL/min   GFR calc Af Amer >60 >60 mL/min    Comment: (NOTE) The eGFR has been calculated using the CKD EPI equation. This calculation has not been validated in all clinical situations. eGFR's persistently <60 mL/min signify possible Chronic Kidney Disease.    Anion gap 10 5 - 15    Comment: Performed at [facility], [place] [address] [place], [place] [address]  HIV antibody (Routine Testing)     Status: None   Collection Time: 09/28/17  2:07 AM  Result Value Ref Range   HIV Screen 4th Generation wRfx Non Reactive Non Reactive    Comment: (NOTE) Performed At: [facility] [place], [place] [id number] Rush Farmer MD [phone] Performed at [facility], [place] [address] [place], [place] [address]   Magnesium     Status: Abnormal   Collection Time: 09/28/17  2:07 AM  Result Value Ref Range   Magnesium 1.6 (L) 1.7 - 2.4 mg/dL    Comment: Performed at [facility], [place] [address], [place], [place] [address]   Phosphorus     Status: None   Collection Time: 09/28/17  2:07 AM  Result Value Ref Range   Phosphorus 2.8 2.5 - 4.6 mg/dL    Comment: Performed  at [facility], [place] [address] [place], [place] [address]  TSH     Status: None   Collection Time: 09/28/17  2:07 AM  Result Value Ref Range   TSH 1.904 0.350 - 4.500 uIU/mL    Comment: Performed by a 3rd Generation assay with a functional sensitivity of <=0.01 uIU/mL. Performed at [facility], [place] [address], [place], [place] [address]   CBC     Status: Abnormal   Collection Time: 09/28/17  2:07 AM  Result Value Ref Range   WBC 9.5 4.0 - 10.5 K/uL   RBC 4.72 4.22 - 5.81 MIL/uL   Hemoglobin 14.7 13.0 - 17.0 g/dL   HCT 41.6 39.0 - 52.0 %   MCV 88.1 78.0 - 100.0 fL   MCH 31.1 26.0 - 34.0 pg   MCHC 35.3 30.0 - 36.0 g/dL   RDW 13.8 11.5 - 15.5 %   Platelets 127 (L) 150 - 400 K/uL    Comment: Performed at [facility], [place] [address], [place], [place] [address]  CK     Status: None   Collection Time: 09/28/17  2:07 AM  Result Value Ref Range   Total CK 98 49 - 397 U/L    Comment: Performed at [facility], [place] [address] [place], [place] [address]  Glucose, capillary     Status: Abnormal   Collection Time: 09/28/17  3:23 AM  Result Value Ref Range   Glucose-Capillary 162 (H) 65 - 99 mg/dL   Comment 1 Notify RN    Comment 2 Document in Chart   Glucose, capillary     Status: Abnormal   Collection Time: 09/28/17  7:53 AM  Result Value Ref Range   Glucose-Capillary 124 (H) 65 - 99 mg/dL   Comment 1 Notify RN    Comment 2 Document in Chart   Comprehensive metabolic panel     Status: Abnormal   Collection Time: 09/28/17  7:54 AM  Result Value Ref Range   Sodium 119 (LL) 135 - 145 mmol/L    Comment: CRITICAL RESULT CALLED TO, READ BACK BY AND VERIFIED WITH: B.COGGIN,RN 09/28/17 @0900  BY V.WILKINS    Potassium 4.4 3.5 - 5.1 mmol/L   Chloride 83 (L) 101 - 111  mmol/L   CO2 28 22 - 32 mmol/L   Glucose, Bld 119 (H) 65 - 99 mg/dL   BUN 11 6 - 20 mg/dL   Creatinine, Ser 0.56 (L) 0.61 - 1.24 mg/dL   Calcium 8.2 (L) 8.9 - 10.3 mg/dL   Total Protein 6.2 (L) 6.5 - 8.1 g/dL   Albumin 2.7 (L) 3.5 - 5.0 g/dL   AST 103 (H) 15 - 41 U/L   ALT 176 (H) 17 - 63 U/L   Alkaline Phosphatase 145 (H) 38 - 126 U/L   Total Bilirubin 11.1 (H) 0.3 - 1.2 mg/dL   GFR calc non Af Amer >60 >60 mL/min   GFR calc Af Amer >60 >60 mL/min    Comment: (NOTE) The eGFR has been calculated using the CKD EPI equation. This calculation has not been validated in all clinical situations. eGFR's persistently <60 mL/min signify possible Chronic Kidney Disease.    Anion gap 8 5 - 15    Comment: Performed at [facility], [place] [address] [place], [place] [address]  Osmolality     Status: Abnormal   Collection Time: 09/28/17  7:54 AM  Result Value Ref Range   Osmolality 252 (L) 275 -  295 mOsm/kg    Comment: Performed at [facility], [place] [address] [place], [place] [address]  Glucose, capillary     Status: Abnormal   Collection Time: 09/28/17 11:51 AM  Result Value Ref Range   Glucose-Capillary 145 (H) 65 - 99 mg/dL   Comment 1 Notify RN    Comment 2 Document in Chart   Basic metabolic panel     Status: Abnormal   Collection Time: 09/28/17  1:49 PM  Result Value Ref Range   Sodium 119 (LL) 135 - 145 mmol/L    Comment: CRITICAL RESULT CALLED TO, READ BACK BY AND VERIFIED WITH: SMITH,J RN [phone] COVINGTON,N    Potassium 4.5 3.5 - 5.1 mmol/L   Chloride 84 (L) 101 - 111 mmol/L   CO2 26 22 - 32 mmol/L   Glucose, Bld 170 (H) 65 - 99 mg/dL   BUN 11 6 - 20 mg/dL   Creatinine, Ser 0.58 (L) 0.61 - 1.24 mg/dL   Calcium 8.3 (L) 8.9 - 10.3 mg/dL   GFR calc non Af Amer >60 >60 mL/min   GFR calc Af Amer >60 >60 mL/min    Comment: (NOTE) The eGFR has been calculated using the CKD EPI equation. This calculation has not been validated in all clinical  situations. eGFR's persistently <60 mL/min signify possible Chronic Kidney Disease.    Anion gap 9 5 - 15    Comment: Performed at [facility], [place] [address] [place], [place] [address]  Glucose, capillary     Status: Abnormal   Collection Time: 09/28/17  4:11 PM  Result Value Ref Range   Glucose-Capillary 192 (H) 65 - 99 mg/dL   Comment 1 Notify RN    Comment 2 Document in Chart   Glucose, capillary     Status: Abnormal   Collection Time: 09/28/17  7:38 PM  Result Value Ref Range   Glucose-Capillary 155 (H) 65 - 99 mg/dL   Comment 1 Notify RN    Comment 2 Document in Chart   Basic metabolic panel     Status: Abnormal   Collection Time: 09/28/17  7:53 PM  Result Value Ref Range   Sodium 119 (LL) 135 - 145 mmol/L    Comment: CRITICAL RESULT CALLED TO, READ BACK BY AND VERIFIED WITH: YACOPINO,J RN [phone] COVINGTON,N    Potassium 4.4 3.5 - 5.1 mmol/L   Chloride 84 (L) 101 - 111 mmol/L   CO2 28 22 - 32 mmol/L   Glucose, Bld 152 (H) 65 - 99 mg/dL   BUN 11 6 - 20 mg/dL   Creatinine, Ser 0.57 (L) 0.61 - 1.24 mg/dL   Calcium 8.2 (L) 8.9 - 10.3 mg/dL   GFR calc non Af Amer >60 >60 mL/min   GFR calc Af Amer >60 >60 mL/min    Comment: (NOTE) The eGFR has been calculated using the CKD EPI equation. This calculation has not been validated in all clinical situations. eGFR's persistently <60 mL/min signify possible Chronic Kidney Disease.    Anion gap 7 5 - 15    Comment: Performed at [facility], [place] [address] [place],  [address]  Glucose, capillary     Status: Abnormal   Collection Time: 09/29/17 12:34 AM  Result Value Ref Range   Glucose-Capillary 167 (H) 65 - 99 mg/dL   Comment 1 Notify RN    Comment 2 Document in Chart   Basic metabolic panel     Status: Abnormal  Collection Time: 09/29/17  1:56 AM  Result Value Ref Range   Sodium 116 (LL) 135 - 145 mmol/L    Comment: CRITICAL RESULT CALLED TO, READ BACK BY AND VERIFIED  WITH: N BUBE,RN 09/29/17 0256 RHOLMES    Potassium 5.0 3.5 - 5.1 mmol/L   Chloride 82 (L) 101 - 111 mmol/L   CO2 25 22 - 32 mmol/L   Glucose, Bld 149 (H) 65 - 99 mg/dL   BUN 11 6 - 20 mg/dL   Creatinine, Ser 0.36 (L) 0.61 - 1.24 mg/dL   Calcium 8.1 (L) 8.9 - 10.3 mg/dL   GFR calc non Af Amer >60 >60 mL/min   GFR calc Af Amer >60 >60 mL/min    Comment: (NOTE) The eGFR has been calculated using the CKD EPI equation. This calculation has not been validated in all clinical situations. eGFR's persistently <60 mL/min signify possible Chronic Kidney Disease.    Anion gap 9 5 - 15    Comment: Performed at [facility], [place] [address] [place], [place] [address]  Phosphorus     Status: None   Collection Time: 09/29/17  1:56 AM  Result Value Ref Range   Phosphorus 2.6 2.5 - 4.6 mg/dL    Comment: Performed at [facility], [place] [address] [place], [place] [address]  Magnesium     Status: None   Collection Time: 09/29/17  1:56 AM  Result Value Ref Range   Magnesium 1.7 1.7 - 2.4 mg/dL    Comment: Performed at [facility], [place] [address] [place], [place] [address]  Glucose, capillary     Status: Abnormal   Collection Time: 09/29/17  4:12 AM  Result Value Ref Range   Glucose-Capillary 140 (H) 65 - 99 mg/dL   Comment 1 Notify RN    Comment 2 Document in Chart   Basic metabolic panel     Status: Abnormal   Collection Time: 09/29/17  7:47 AM  Result Value Ref Range   Sodium 117 (LL) 135 - 145 mmol/L    Comment: CRITICAL RESULT CALLED TO, READ BACK BY AND VERIFIED WITH: A MELTON,RN 09/29/17 0913 RHOLMES    Potassium 4.6 3.5 - 5.1 mmol/L   Chloride 82 (L) 101 - 111 mmol/L   CO2 26 22 - 32 mmol/L   Glucose, Bld 148 (H) 65 - 99 mg/dL   BUN 11 6 - 20 mg/dL   Creatinine, Ser 0.54 (L) 0.61 - 1.24 mg/dL   Calcium 8.3 (L) 8.9 - 10.3 mg/dL   GFR calc non Af Amer >60 >60 mL/min   GFR calc Af Amer >60 >60 mL/min    Comment: (NOTE) The  eGFR has been calculated using the CKD EPI equation. This calculation has not been validated in all clinical situations. eGFR's persistently <60 mL/min signify possible Chronic Kidney Disease.    Anion gap 9 5 - 15    Comment: Performed at [facility], [place] [address], [place], [place] [address]  Glucose, capillary     Status: Abnormal   Collection Time: 09/29/17  8:42 AM  Result Value Ref Range   Glucose-Capillary 161 (H) 65 - 99 mg/dL   Comment 1 Notify RN    Comment 2 Document in Chart     Dg Chest Port 1 View  Result Date: 09/27/2017 CLINICAL DATA:  Weakness. EXAM: PORTABLE CHEST 1 VIEW COMPARISON:  None. FINDINGS: The heart size is borderline to mildly enlarged. The hila, mediastinum, lungs, and pleura are  unremarkable. IMPRESSION: No active disease. Electronically Signed   By: Dorise Bullion III M.D   On: 09/27/2017 19:49    ROS Blood pressure (!) 174/75, pulse 95, temperature 98 F (36.7 C), temperature source Axillary, resp. rate 19, height 6' 3"  (1.905 m), weight (!) 140.6 kg (310 lb), SpO2 94 %. Physical Exam Physical Examination: General appearance - alert, well appearing, and in no distress and obese Mental status - alert, oriented to person, place, and time, cooperative Eyes - pupils equal and reactive, extraocular eye movements intact, funduscopic exam normal, discs flat and sharp Mouth - mucous membranes moist, pharynx normal without lesions Neck - adenopathy noted PCL Lymphatics - posterior cervical nodes Chest - decreased air entry noted bilat Heart - S1 and S2 normal, systolic murmur P7/9 at 2nd left intercostal space Abdomen - obese, mild RUQ tender , liver down 6 cm Extremities - pedal edema 1-2+  Skin - plethoric, stasis changes calves  Assessment/Plan: 1 Hyponatremia.  Low FENA c/w hypo perfusion and has vol xs . 2 D pendiing .  Very high UOsm   Despite low Fena, and volxs, suspect this is SIADH.  LFT^^, make this challenging.  Will use  Lasix, fluid restrict, Demeclo.  Hold off Tolvaptan with ^^LFTs.  ARB and NSAID can both contribute to low SNa 2 Lung and liver masses for Bx 3 Hypertension: will stop Ibesartan with poss role in low SNa and use Lasix 4. DM 5. Obesity P Lasix, fluid restrict, Demeclo, will consider urea po and if not better Tolvaptan with monitoring  Jeneen Rinks Rylin Seavey 09/29/2017, 11:43 AM

## 2017-09-29 NOTE — Progress Notes (Addendum)
Pt states he was Dx with OSA & prescribed to wear CPAP at night. Per Pt and Pt family, Pt has not been compliant with wearing CPAP at home. Pt 02 SATS between 87-91% when asleep. RN placed Pt on 2 L nasal canula  and 02 SAT improved.  Pt is now refusing to wear oxygen despite low 02 SAT. Pt states "he does not care, and he would rather be comfortable than wear oxygen". RN educated Pt, and  will continue to monitor.

## 2017-09-29 NOTE — Progress Notes (Signed)
PROGRESS NOTE    Robert Compton   YIR:[id number]  DOB: 01-08-1946  DOA: 09/27/2017 PCP: [name], MD   Brief Narrative:  Robert Compton is a 72 y/o male with DM, HTN, obesity, smoking who presents for severe fatigue and is found to have a sodium level of 117.   He was seen in the ED on 09/20/17 for abdominal distension and hematuria dn under went a CT renal stone study which found liver masses. She was followed up by a CT chest/abd/pelvis with contrast by his PCP which found liver masses and a right apical lung mass with lymphadenopathy. He is admitted for hyponatremia  Subjective: Had a bad night as the O2 via Nasal cannula was bothering him. He started to cough up blood last night and he blames it on the O2. He had numerous BMs after the dulcolax suppository. He has no numbness, tingling, dizziness or change in vision.   Assessment & Plan:   Active Problems:   Hyponatremia - 3/15- suspect SIADH- sodium is 119 today- have started fluid restriction of 1 L today- follow sodium levels closely and cont to follow in SDU - 3/16- sodium is back down to 117 after going up to 121- have asked for nephro assistance in correcting it    Metastatic cancer to liver- Right apical lung mass- elevated LFTs - suspecting lung CA to be the source- Oncology would like a liver biopsy- MRI brain ordered today to r/u brain mets - IR to obtain liver biopsy tomorrow  Hematemesis Cough/ wheezing 2ppd smoker -   due to lung mass vs acute bronchitis  - probable COPD although never diagnosed- cont  Nebs and Mucinex - nicoderm patch ordered- provided counseling  Hypoxic at night - likely has OSA- will need outpt sleep study- cont O2 PRN pulse ox < 88%  Abdominal pain - for which he came to the ED last week and was prescribed MS Contin- he states that he took 1 pill and "almost died" and did not take more - he is sore in right upper abdomen- K pad and Voltaren gel ordered    DM (diabetes mellitus),  type 2  A1c is 7.2 - holding metformin in the hospital- cont ISS   Severe constipation - he states he has not had a BM In days- Dulcolax suppository order which resolved the issue  Hematuria - small amount on Hemoglobin noted on UA but no RBCs seen    Essential hypertension - Avapro o    DVT prophylaxis: SCDs Code Status: Full code Family Communication:  Disposition Plan: follow in SDU until sodium improves Consultants:   oncology Procedures:   none Antimicrobials:  Anti-infectives (From admission, onward)   None       Objective: Vitals:   09/29/17 0412 09/29/17 0450 09/29/17 0800 09/29/17 0822  BP:      Pulse:      Resp:      Temp:  (!) 97.5 F (36.4 C) 98 F (36.7 C)   TempSrc:  Oral Axillary   SpO2: 91%   94%  Weight:      Height:        Intake/Output Summary (Last 24 hours) at 09/29/2017 1041 Last data filed at 09/29/2017 0900 Gross per 24 hour  Intake 150 ml  Output 900 ml  Net -750 ml   Filed Weights   09/27/17 1844  Weight: (!) 140.6 kg (310 lb)    Examination: General exam: Appears comfortable  HEENT: PERRLA, oral mucosa moist, no sclera icterus  or thrush Respiratory system: Clear to auscultation. Respiratory effort normal. Cardiovascular system: S1 & S2 heard, RRR.   Gastrointestinal system: Abdomen soft, obese,  Tender across right upper and mid abdomen going to the back, nondistended. Normal bowel sound. No organomegaly Central nervous system: Alert and oriented. No focal neurological deficits. Extremities: No cyanosis, clubbing or edema Skin: No rashes or ulcers Psychiatry:  Mood & affect appropriate.     Data Reviewed: I have personally reviewed following labs and imaging studies  CBC: Recent Labs  Lab 09/27/17 2000 09/28/17 0207  WBC 10.1 9.5  NEUTROABS 8.1*  --   HGB 16.5 14.7  HCT 44.8 41.6  MCV 87.5 88.1  PLT 134* 563*   Basic Metabolic Panel: Recent Labs  Lab 09/27/17 2000 09/28/17 0207 09/28/17 0754  09/28/17 1349 09/28/17 1953 09/29/17 0156 09/29/17 0747  NA 117* 121* 119* 119* 119* 116* 117*  K 4.7 4.3 4.4 4.5 4.4 5.0 4.6  CL 79* 84* 83* 84* 84* 82* 82*  CO2 27 27 28 26 28 25 26   GLUCOSE 143* 164* 119* 170* 152* 149* 148*  BUN 9 11 11 11 11 11 11   CREATININE 0.55* 0.60* 0.56* 0.58* 0.57* 0.36* 0.54*  CALCIUM 8.8* 8.3* 8.2* 8.3* 8.2* 8.1* 8.3*  MG 1.8 1.6*  --   --   --  1.7  --   PHOS  --  2.8  --   --   --  2.6  --    GFR: Estimated Creatinine Clearance: 129.9 mL/min (A) (by C-G formula based on SCr of 0.54 mg/dL (L)). Liver Function Tests: Recent Labs  Lab 09/27/17 2000 09/28/17 0754  AST 133* 103*  ALT 224* 176*  ALKPHOS 156* 145*  BILITOT 12.3* 11.1*  PROT 6.9 6.2*  ALBUMIN 3.1* 2.7*   Recent Labs  Lab 09/27/17 2000  LIPASE 85*   Recent Labs  Lab 09/27/17 2316  AMMONIA 33   Coagulation Profile: Recent Labs  Lab 09/28/17 0005  INR 1.09   Cardiac Enzymes: Recent Labs  Lab 09/27/17 2000 09/28/17 0207  CKTOTAL  --  11  TROPONINI <0.03  --    BNP (last 3 results) No results for input(s): PROBNP in the last 8760 hours. HbA1C: Recent Labs    09/28/17 0207  HGBA1C 7.2*   CBG: Recent Labs  Lab 09/28/17 1611 09/28/17 1938 09/29/17 0034 09/29/17 0412 09/29/17 0842  GLUCAP 192* 155* 167* 140* 161*   Lipid Profile: No results for input(s): CHOL, HDL, LDLCALC, TRIG, CHOLHDL, LDLDIRECT in the last 72 hours. Thyroid Function Tests: Recent Labs    09/28/17 0207  TSH 1.904   Anemia Panel: No results for input(s): VITAMINB12, FOLATE, FERRITIN, TIBC, IRON, RETICCTPCT in the last 72 hours. Urine analysis:    Component Value Date/Time   COLORURINE AMBER (A) 09/28/2017 0030   APPEARANCEUR CLEAR 09/28/2017 0030   LABSPEC 1.017 09/28/2017 0030   PHURINE 5.0 09/28/2017 0030   GLUCOSEU 50 (A) 09/28/2017 0030   HGBUR SMALL (A) 09/28/2017 0030   BILIRUBINUR MODERATE (A) 09/28/2017 0030   KETONESUR NEGATIVE 09/28/2017 0030   PROTEINUR 100 (A)  09/28/2017 0030   NITRITE NEGATIVE 09/28/2017 0030   LEUKOCYTESUR NEGATIVE 09/28/2017 0030   Sepsis Labs: @LABRCNTIP (procalcitonin:4,lacticidven:4) ) Recent Results (from the past 240 hour(s))  MRSA PCR Screening     Status: None   Collection Time: 09/28/17 12:00 AM  Result Value Ref Range Status   MRSA by PCR NEGATIVE NEGATIVE Final    Comment:  The GeneXpert MRSA Assay (FDA approved for NASAL specimens only), is one component of a comprehensive MRSA colonization surveillance program. It is not intended to diagnose MRSA infection nor to guide or monitor treatment for MRSA infections. Performed at [facility], [place] [address] [place], [place] [address]   Urine culture     Status: Abnormal   Collection Time: 09/28/17 12:30 AM  Result Value Ref Range Status   Specimen Description   Final    URINE, CATHETERIZED Performed at [place] [address] [place], [place] [address]    Special Requests   Final    Normal Performed at [facility], [place] [address], [place], [place] [address]    Culture (A)  Final    <10,000 COLONIES/mL INSIGNIFICANT GROWTH Performed at [place] [address], [place],  [address]    Report Status 09/29/2017 FINAL  Final         Radiology Studies: Dg Chest Port 1 View  Result Date: 09/27/2017 CLINICAL DATA:  Weakness. EXAM: PORTABLE CHEST 1 VIEW COMPARISON:  None. FINDINGS: The heart size is borderline to mildly enlarged. The hila, mediastinum, lungs, and pleura are unremarkable. IMPRESSION: No active disease. Electronically Signed   By: Dorise Bullion III M.D   On: 09/27/2017 19:49      Scheduled Meds: . bisacodyl  10 mg Rectal Daily  . chlorpheniramine-HYDROcodone  5 mL Oral Q12H  . diclofenac sodium  2 g Topical QID  . famotidine  10 mg Oral Daily  . guaiFENesin  600 mg Oral BID  . insulin aspart  0-15 Units Subcutaneous TID WC  . insulin aspart   0-5 Units Subcutaneous QHS  . ipratropium-albuterol  3 mL Nebulization BID  . irbesartan  300 mg Oral Daily  . nicotine  21 mg Transdermal Daily  . omega-3 acid ethyl esters  1 g Oral BID  . senna  1 tablet Oral BID   Continuous Infusions:   LOS: 2 days    Time spent in minutes: 35    Debbe Odea, MD [facility] Pager: [email or web address] Password [facility] 09/29/2017, 10:41 AM

## 2017-09-29 NOTE — Progress Notes (Signed)
CRITICAL VALUE ALERT  Critical Value:  Na 117  Date & Time Notied:  09/29/17 1057, see provider note   Provider Notified: rizwan  Orders Received/Actions taken:

## 2017-09-29 NOTE — Progress Notes (Signed)
Paged Dr. Silas Sacramento to inform lab called with a critical Sodium level of 116.

## 2017-09-29 NOTE — Progress Notes (Addendum)
Nutrition Brief Note  RD consulted for nutritional assessment.  Pt with some weight changes but not significant for time frames. Pt reported no weight changes in MST screen. Pt is eating 100% of meals.  Wt Readings from Last 15 Encounters:  09/27/17 (!) 310 lb (140.6 kg)  09/20/17 (!) 311 lb (141.1 kg)  10/06/16 (!) 317 lb (143.8 kg)  06/07/12 (!) 321 lb (145.6 kg)    Body mass index is 38.75 kg/m. Patient meets criteria for obesity based on current BMI.   Current diet order is CHO modified, patient is consuming approximately 100% of meals at this time. Labs and medications reviewed.   No nutrition interventions warranted at this time. If nutrition issues arise, please consult RD.   Clayton Bibles, MS, RD, [place] Dietitian Pager: [phone] After Hours Pager: [phone]

## 2017-09-29 NOTE — Evaluation (Signed)
Occupational Therapy Evaluation Patient Details Name: Robert Compton MRN: [id number] DOB: [birth year] Today's Date: 09/29/2017    History of Present Illness Robert Compton is a 72 y.o. male with medical history significant of hypertension diabetes type 2, right TKR and  new diagnosis of metastatic cancer likely lung with liver mets admitted due to weakness and nausea   Clinical Impression   This 72 yo male admitted with above presents to acute OT with decreased mobility, decreased strength, decreased activity tolerance all affecting his safety and independence with basic ADLs. He will benefit from acute OT with follow up OT at SNF to work back towards his PLOF of being totally independent and working.     Follow Up Recommendations  SNF;Supervision/Assistance - 24 hour    Equipment Recommendations  Other (comment)(wide 3n1)       Precautions / Restrictions Precautions Precautions: Fall Restrictions Weight Bearing Restrictions: No      Mobility Bed Mobility Overal bed mobility: Needs Assistance Bed Mobility: Supine to Sit     Supine to sit: Min guard;HOB elevated     General bed mobility comments: also used rail and increased time  Transfers Overall transfer level: Needs assistance Equipment used: Rolling walker (2 wheeled) Transfers: Sit to/from Stand Sit to Stand: Min assist;+2 physical assistance;From elevated surface              Balance Overall balance assessment: Needs assistance Sitting-balance support: No upper extremity supported;Feet supported Sitting balance-Leahy Scale: Good     Standing balance support: Bilateral upper extremity supported Standing balance-Leahy Scale: Poor Standing balance comment: reliant on RW                           ADL either performed or assessed with clinical judgement   ADL Overall ADL's : Needs assistance/impaired Eating/Feeding: Independent;Sitting   Grooming: Set up;Supervision/safety;Sitting   Upper  Body Bathing: Supervision/ safety;Set up;Sitting   Lower Body Bathing: Moderate assistance Lower Body Bathing Details (indicate cue type and reason): min A +2 sit<>stand from raised bed, Mod A +2 from regular height bed Upper Body Dressing : Supervision/safety;Set up;Sitting   Lower Body Dressing: Moderate assistance Lower Body Dressing Details (indicate cue type and reason): min A +2 sit<>stand from raised bed, Mod A +2 from regular height bed Toilet Transfer: Biomedical scientist Details (indicate cue type and reason): min A +2 sit<>stand from raised bed, Mod A +2 from regular height bed Toileting- Clothing Manipulation and Hygiene: Moderate assistance Toileting - Clothing Manipulation Details (indicate cue type and reason): min A +2 sit<>stand from raised bed, Mod A +2 from regular height bed             Vision Patient Visual Report: No change from baseline              Pertinent Vitals/Pain Pain Assessment: No/denies pain     Hand Dominance Right   Extremity/Trunk Assessment Upper Extremity Assessment Upper Extremity Assessment: Overall WFL for tasks assessed   Lower Extremity Assessment Lower Extremity Assessment: Defer to PT evaluation       Communication Communication Communication: No difficulties   Cognition Arousal/Alertness: Lethargic(says he did not sleep well last night) Behavior During Therapy: WFL for tasks assessed/performed Overall Cognitive Status: Within Functional Limits for tasks assessed  Home Living Family/patient expects to be discharged to:: Skilled nursing facility Living Arrangements: Other relatives(son who works) Available Help at Discharge: Family;Available PRN/intermittently Type of Home: House Home Access: Stairs to enter [employer] of Steps: 2 Entrance Stairs-Rails: None Home Layout: One level     Bathroom Shower/Tub: Medical illustrator: Handicapped height     Home Equipment: None   Additional Comments: Pt sleeps in recliner at home      Prior Functioning/Environment Level of Independence: Independent        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        OT Problem List: Decreased strength;Decreased activity tolerance;Impaired balance (sitting and/or standing);Obesity;Decreased knowledge of use of DME or AE      OT Treatment/Interventions: Self-care/ADL training;Balance training;DME and/or AE instruction;Patient/family education    OT Goals(Current goals can be found in the care plan section) Acute Rehab OT Goals Patient Stated Goal: to rest more today and get stronger OT Goal Formulation: With patient Time For Goal Achievement: 10/13/17 Potential to Achieve Goals: Good  OT Frequency: Min 2X/week   Barriers to D/C: Decreased caregiver support          Co-evaluation PT/OT/SLP Co-Evaluation/Treatment: Yes Reason for Co-Treatment: For patient/therapist safety   OT goals addressed during session: ADL's and self-care;Strengthening/ROM      AM-PAC PT "6 Clicks" Daily Activity     Outcome Measure Help from another person eating meals?: None Help from another person taking care of personal grooming?: A Little Help from another person toileting, which includes using toliet, bedpan, or urinal?: A Lot Help from another person bathing (including washing, rinsing, drying)?: A Lot Help from another person to put on and taking off regular upper body clothing?: A Little Help from another person to put on and taking off regular lower body clothing?: A Lot 6 Click Score: 16   End of Session Equipment Utilized During Treatment: Rolling walker Nurse Communication: Mobility status  Activity Tolerance: Patient limited by lethargy Patient left: in chair;with call bell/phone within reach;with chair alarm set  OT Visit Diagnosis: Unsteadiness on feet (R26.81);Other abnormalities of gait  and mobility (R26.89);Muscle weakness (generalized) (M62.81)                Time: 6144-3154 OT Time Calculation (min): 21 min Charges:  OT General Charges $OT Visit: 1 Visit OT Evaluation $OT Eval Moderate Complexity: [address], [place] [phone] 09/29/2017

## 2017-09-29 NOTE — Evaluation (Signed)
Physical Therapy Evaluation Patient Details Name: Robert Compton MRN: [id number] DOB: Mar 22, 1946 Today's Date: 09/29/2017   History of Present Illness  72 y.o. male with medical history significant of hypertension diabetes type 2, right TKR and  new diagnosis of metastatic cancer likely lung with liver mets admitted due to weakness and nausea  Clinical Impression  On eval, pt required Min assist +2 for mobility. He was able to take a few steps in his room with use of a RW. Pt presents with general weakness, decreased activity tolerance, and impaired gait and balance. He c/o lightheadedness during session. Anticipate pt will need a short rehab stay before returning home alone. Will follow and progress activity as tolerated.     Follow Up Recommendations SNF    Equipment Recommendations  (possibly a RW)    Recommendations for Other Services       Precautions / Restrictions Precautions Precautions: Fall Precaution Comments: monitor vitals Restrictions Weight Bearing Restrictions: No      Mobility  Bed Mobility Overal bed mobility: Needs Assistance Bed Mobility: Supine to Sit     Supine to sit: Min guard;HOB elevated     General bed mobility comments: also used rail and increased time  Transfers Overall transfer level: Needs assistance Equipment used: Rolling walker (2 wheeled) Transfers: Sit to/from [facility] Sit to Stand: Min assist;+2 physical assistance;From elevated surface;+2 safety/equipment         General transfer comment: assist to rise, stabilize, control descent. vcs safety, hand placement. Pt was unable to rise from a lower surface so elevated bed quite a bit. Stand pivot, bed to recliner, with RW  Ambulation/Gait Ambulation/Gait assistance: Min assist;+2 physical assistance;+2 safety/equipment Gait Distance (Feet): 3 Feet Assistive device: Rolling walker (2 wheeled) Gait Pattern/deviations: Step-through pattern     General Gait  Details: Assist to stabilize pt and maneuver with RW. Cues for safety. Pt was able to take a few steps in room before c/o weakness, lightheadedness and fatigue  Stairs            Wheelchair Mobility    Modified Rankin (Stroke Patients Only)       Balance Overall balance assessment: Needs assistance   Sitting balance-Leahy Scale: Good     Standing balance support: Bilateral upper extremity supported Standing balance-Leahy Scale: Poor                               Pertinent Vitals/Pain Pain Assessment: No/denies pain    Home Living Family/patient expects to be discharged to:: Skilled nursing facility Living Arrangements: Other relatives Available Help at Discharge: Family;Available PRN/intermittently Type of Home: House Home Access: Stairs to enter Entrance Stairs-Rails: None Entrance Stairs-Number of Steps: 2 Home Layout: One level Home Equipment: None Additional Comments: Pt sleeps in recliner at home    Prior Function Level of Independence: Independent         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, newspaper   Dominant Hand: Right    Extremity/Trunk Assessment   Upper Extremity Assessment Upper Extremity Assessment: Defer to OT evaluation    Lower Extremity Assessment Lower Extremity Assessment: Generalized weakness    Cervical / Trunk Assessment Cervical / Trunk Assessment: Normal  Communication   Communication: No difficulties  Cognition Arousal/Alertness: Awake/alert Behavior During Therapy: WFL for tasks assessed/performed Overall Cognitive Status: Within Functional Limits for tasks assessed  General Comments      Exercises     Assessment/Plan    PT Assessment Patient needs continued PT services  PT Problem List Decreased strength;Decreased balance;Decreased mobility;Decreased activity tolerance;Decreased knowledge of use of DME;Obesity        PT Treatment Interventions DME instruction;Gait training;Functional mobility training;Therapeutic activities;Balance training;Patient/family education;Therapeutic exercise    PT Goals (Current goals can be found in the Care Plan section)  Acute Rehab PT Goals Patient Stated Goal: to rest more today and get stronger PT Goal Formulation: With patient Time For Goal Achievement: 10/13/17 Potential to Achieve Goals: Good    Frequency Min 3X/week   Barriers to discharge        Co-evaluation   Reason for Co-Treatment: For patient/therapist safety   OT goals addressed during session: ADL's and self-care;Strengthening/ROM       AM-PAC PT "6 Clicks" Daily Activity  Outcome Measure Difficulty turning over in bed (including adjusting bedclothes, sheets and blankets)?: A Lot Difficulty moving from lying on back to sitting on the side of the bed? : A Lot Difficulty sitting down on and standing up from a chair with arms (e.g., wheelchair, bedside commode, etc,.)?: Unable Help needed moving to and from a bed to chair (including a wheelchair)?: A Lot Help needed walking in hospital room?: A Lot Help needed climbing 3-5 steps with a railing? : Total 6 Click Score: 10    End of Session   Activity Tolerance: Patient limited by fatigue Patient left: in chair;with call bell/phone within reach;with chair alarm set   PT Visit Diagnosis: Difficulty in walking, not elsewhere classified (R26.2);Muscle weakness (generalized) (M62.81)    Time: 1610-9604 PT Time Calculation (min) (ACUTE ONLY): 19 min   Charges:   PT Evaluation $PT Eval Moderate Complexity: 1 Mod     PT G Codes:          Weston Anna, MPT Pager: [phone]

## 2017-09-30 ENCOUNTER — Inpatient Hospital Stay (HOSPITAL_COMMUNITY): Payer: BLUE CROSS/BLUE SHIELD

## 2017-09-30 LAB — BASIC METABOLIC PANEL
Anion gap: 12 (ref 5–15)
Anion gap: 12 (ref 5–15)
BUN: 14 mg/dL (ref 6–20)
BUN: 18 mg/dL (ref 6–20)
CHLORIDE: 81 mmol/L — AB (ref 101–111)
CHLORIDE: 81 mmol/L — AB (ref 101–111)
CO2: 25 mmol/L (ref 22–32)
CO2: 27 mmol/L (ref 22–32)
Calcium: 8.6 mg/dL — ABNORMAL LOW (ref 8.9–10.3)
Calcium: 8.7 mg/dL — ABNORMAL LOW (ref 8.9–10.3)
Creatinine, Ser: 0.74 mg/dL (ref 0.61–1.24)
Creatinine, Ser: 0.74 mg/dL (ref 0.61–1.24)
GFR calc non Af Amer: >60 mL/min (ref >60)
GFR calc non Af Amer: >60 mL/min (ref >60)
Glucose, Bld: 157 mg/dL — ABNORMAL HIGH (ref 65–99)
Glucose, Bld: 210 mg/dL — ABNORMAL HIGH (ref 65–99)
POTASSIUM: 4.6 mmol/L (ref 3.5–5.1)
POTASSIUM: 4.7 mmol/L (ref 3.5–5.1)
SODIUM: 118 mmol/L — AB (ref 135–145)
Sodium: 120 mmol/L — ABNORMAL LOW (ref 135–145)

## 2017-09-30 LAB — GLUCOSE, CAPILLARY
GLUCOSE-CAPILLARY: 155 mg/dL — AB (ref 65–99)
GLUCOSE-CAPILLARY: 157 mg/dL — AB (ref 65–99)
Glucose-Capillary: 129 mg/dL — ABNORMAL HIGH (ref 65–99)
Glucose-Capillary: 154 mg/dL — ABNORMAL HIGH (ref 65–99)

## 2017-09-30 MED ORDER — LIDOCAINE-EPINEPHRINE (PF) 2 %-1:200000 IJ SOLN
INTRAMUSCULAR | Status: AC
  Filled 2017-09-30: qty 20

## 2017-09-30 MED ORDER — MIDAZOLAM HCL 2 MG/2ML IJ SOLN
INTRAMUSCULAR | Status: AC
  Filled 2017-09-30: qty 4

## 2017-09-30 MED ORDER — MIDAZOLAM HCL 2 MG/2ML IJ SOLN
INTRAMUSCULAR | Status: AC | PRN
  Administered 2017-09-30: 1 mg via INTRAVENOUS

## 2017-09-30 MED ORDER — NALOXONE HCL 0.4 MG/ML IJ SOLN
INTRAMUSCULAR | Status: AC
  Filled 2017-09-30: qty 1

## 2017-09-30 MED ORDER — FENTANYL CITRATE (PF) 100 MCG/2ML IJ SOLN
INTRAMUSCULAR | Status: AC
  Filled 2017-09-30: qty 4

## 2017-09-30 MED ORDER — FLUMAZENIL 0.5 MG/5ML IV SOLN
INTRAVENOUS | Status: AC
  Filled 2017-09-30: qty 5

## 2017-09-30 MED ORDER — FENTANYL CITRATE (PF) 100 MCG/2ML IJ SOLN
INTRAMUSCULAR | Status: AC | PRN
  Administered 2017-09-30: 50 ug via INTRAVENOUS

## 2017-09-30 MED ORDER — GELATIN ABSORBABLE 12-7 MM EX MISC
CUTANEOUS | Status: AC
  Filled 2017-09-30: qty 1

## 2017-09-30 NOTE — Procedures (Signed)
Pre Procedure Dx: Liver lesions Post Procedural Dx: Same  Technically successful US guided biopsy of indeterminate lesion within the right lobe of the liver  EBL: None  No immediate complications.   Jay Javarus Dorner, MD Pager #: [phone]    
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
## 2017-09-30 NOTE — Progress Notes (Signed)
[facility] ROUNDING NOTE   Subjective:   This is a very unfortunate situation of a 72 year old smoker with history of diabetes and hypertension that was admitted with apical lung mass and also lymphadenopathy and possible liver metastasis. He was admitted with a strong suspicion of SIADH and is being managed with fluid restriction and demeclocycline and lasix   He is asymptomatic and has a sodium of 118 today . Urine output looks pretty good and continues on fluid restriction.    Objective:  Vital signs in last 24 hours:  Temp:  [97.4 F (36.3 C)-98 F (36.7 C)] 97.7 F (36.5 C) (06/17 0801) Pulse Rate:  [90-99] 90 (06/17 0800) Resp:  [21-23] 21 (06/17 0040) BP: (138-156)/(64-87) 144/66 (06/17 0800) SpO2:  [87 %-91 %] 87 % (06/17 0800)  Weight change:  Filed Weights   09/27/17 1844  Weight: (!) 310 lb (140.6 kg)    Intake/Output: I/O last 3 completed shifts: In: 600 [P.O.:600] Out: 1625 [Urine:1625]   Intake/Output this shift:  No intake/output data recorded.  CVS- RRR RS- CTA ABD- BS present soft non-distended  Normal bowel sound  EXT- no edema   Basic Metabolic Panel: Recent Labs  Lab 09/27/17 2000 09/28/17 0207  09/28/17 1953 09/29/17 0156 09/29/17 0747 09/29/17 1213 09/29/17 2354  NA 117* 121*   < > 119* 116* 117* 118* 118*  K 4.7 4.3   < > 4.4 5.0 4.6 4.6 4.6  CL 79* 84*   < > 84* 82* 82* 82* 81*  CO2 27 27   < > 28 25 26 25 25   GLUCOSE 143* 164*   < > 152* 149* 148* 170* 210*  BUN 9 11   < > 11 11 11 12 14   CREATININE 0.55* 0.60*   < > 0.57* 0.36* 0.54* 0.60* 0.74  CALCIUM 8.8* 8.3*   < > 8.2* 8.1* 8.3* 8.5* 8.6*  MG 1.8 1.6*  --   --  1.7  --   --   --   PHOS  --  2.8  --   --  2.6  --   --   --    < > = values in this interval not displayed.    Liver Function Tests: Recent Labs  Lab 09/27/17 2000 09/28/17 0754  AST 133* 103*  ALT 224* 176*  ALKPHOS 156* 145*  BILITOT 12.3* 11.1*  PROT 6.9 6.2*  ALBUMIN 3.1* 2.7*    Recent Labs  Lab 09/27/17 2000  LIPASE 85*   Recent Labs  Lab 09/27/17 2316  AMMONIA 33    CBC: Recent Labs  Lab 09/27/17 2000 09/28/17 0207  WBC 10.1 9.5  NEUTROABS 8.1*  --   HGB 16.5 14.7  HCT 44.8 41.6  MCV 87.5 88.1  PLT 134* 127*    Cardiac Enzymes: Recent Labs  Lab 09/27/17 2000 09/28/17 0207  CKTOTAL  --  98  TROPONINI <0.03  --     BNP: Invalid input(s): POCBNP  CBG: Recent Labs  Lab 09/29/17 0842 09/29/17 1213 09/29/17 1646 09/29/17 2132 09/30/17 0736  GLUCAP 161* 191* 217* 146* 155*    Microbiology: Results for orders placed or performed during the hospital encounter of 09/27/17  MRSA PCR Screening     Status: None   Collection Time: 09/28/17 12:00 AM  Result Value Ref Range Status   MRSA by PCR NEGATIVE NEGATIVE Final    Comment:        The GeneXpert MRSA Assay (FDA approved for NASAL  specimens only), is one component of a comprehensive MRSA colonization surveillance program. It is not intended to diagnose MRSA infection nor to guide or monitor treatment for MRSA infections. Performed at [facility], [place] [address], [place], [place] [address]   Urine culture     Status: Abnormal   Collection Time: 09/28/17 12:30 AM  Result Value Ref Range Status   Specimen Description   Final    URINE, CATHETERIZED Performed at [place] [address], [place], [place] [address]    Special Requests   Final    Normal Performed at [facility], [place] [address], [place], [place] [address]    Culture (A)  Final    <10,000 COLONIES/mL INSIGNIFICANT GROWTH Performed at [place] [facility] [address] [place], [place] [address]    Report Status 09/29/2017 FINAL  Final    Coagulation Studies: Recent Labs    09/28/17 0005  LABPROT 14.0  INR 1.09    Urinalysis: Recent Labs    09/28/17 0030  COLORURINE AMBER*  LABSPEC 1.017  PHURINE 5.0  GLUCOSEU 50*  HGBUR SMALL*   BILIRUBINUR MODERATE*  KETONESUR NEGATIVE  PROTEINUR 100*  NITRITE NEGATIVE  LEUKOCYTESUR NEGATIVE      Imaging: No results found.   Medications:    . amLODipine  5 mg Oral QHS  . bisacodyl  10 mg Rectal Daily  . chlorpheniramine-HYDROcodone  5 mL Oral Q12H  . demeclocycline  300 mg Oral Q12H  . dextromethorphan-guaiFENesin  1 tablet Oral BID  . diclofenac sodium  2 g Topical QID  . famotidine  10 mg Oral Daily  . furosemide  160 mg Oral BID  . insulin aspart  0-15 Units Subcutaneous TID WC  . insulin aspart  0-5 Units Subcutaneous QHS  . ipratropium-albuterol  3 mL Nebulization BID  . nicotine  21 mg Transdermal Daily  . omega-3 acid ethyl esters  1 g Oral BID  . senna  1 tablet Oral BID   albuterol, bisacodyl, ondansetron **OR** ondansetron (ZOFRAN) IV, polyethylene glycol, traMADol  Assessment/ Plan:   Hyponatremia  This appears to be consistent with SIADH   Noted normal TSH  With urine osm 532 and urine sodium of 42  He has normal renal function   His sodium really has not changed much in the last 24 hours and would continue the current coarse today   Tolvaptan is not being recommended due to the increase in LFT. There is use of topical diclofenac and as this is an nsaid  I would probably stop this although this is topical and not sure how much systemic absorption is occurring   Metastatic cancer to liver  With right apical lung mass  Known smoker with biopsy today   Diabetes  Patients outpatient metformin is being held at this point      LOS: 3 Alzina Golda W @TODAY @10 :47 AM

## 2017-09-30 NOTE — Progress Notes (Signed)
PROGRESS NOTE    Robert Compton   WUJ:[id number]  DOB: 1945-07-16  DOA: 09/27/2017 PCP: Robert Solian, MD   Brief Narrative:  Robert Compton is a 72 y/o male with DM, HTN, obesity, smoking who presents for severe fatigue and is found to have a sodium level of 117.   He was seen in the ED on 09/20/17 for abdominal distension and hematuria dn under went a CT renal stone study which found liver masses. She was followed up by a CT chest/abd/pelvis with contrast by his PCP which found liver masses and a right apical lung mass with lymphadenopathy. He is admitted for hyponatremia  Subjective: Feels hungry and thirsty. Awaiting biopsy. Hematemesis has resolved for now and he has no new issues.    Assessment & Plan:   Active Problems:   Hyponatremia - 3/15- suspect SIADH in relation to possible small cell lung CA- sodium is 119 today- have started fluid restriction of 1 L today- follow sodium levels closely and cont to follow in SDU - 3/16- sodium is back down to 117 after going up to 121 despite fluid restriction- have asked for nephro assistance in correcting it- started on lasix 160 BID and Demeclocycline - sodium 118 today- remains asymptomatic    Metastatic cancer to liver- Right apical lung mass- elevated LFTs - suspecting lung CA to be the source- Oncology would like a liver biopsy- MRI brain ordered today to r/u brain mets - IR to obtain liver biopsy today  Hematemesis Cough/ wheezing 2ppd smoker -   due to lung mass vs acute bronchitis  - probable COPD although never diagnosed- cont  Nebs and Mucinex - nicoderm patch ordered- provided counseling - follow for hematemesis   Hypoxic at night - likely has OSA- will need outpt sleep study- cont O2 PRN pulse ox < 88%  Abdominal pain - for which he came to the ED last week and was prescribed MS Contin- he states that he took 1 pill and "almost died" and did not take more - he is sore in right upper abdomen- K pad and Voltaren  gel ordered    DM (diabetes mellitus), type 2  A1c is 7.2 - holding metformin in the hospital- cont ISS   Severe constipation - he states he has not had a BM In days- Dulcolax suppository order which resolved the issue  Hematuria - small amount on Hemoglobin noted on UA but no RBCs seen    Essential hypertension - Avapro d/c'd by renal team due to hyponatremia   DVT prophylaxis: SCDs Code Status: Full code Family Communication:  Disposition Plan: follow in SDU until sodium improves Consultants:   oncology Procedures:   none Antimicrobials:  Anti-infectives (From admission, onward)   Start     Dose/Rate Route Frequency Ordered Stop   09/29/17 1500  demeclocycline (DECLOMYCIN) tablet 300 mg     300 mg Oral Every 12 hours 09/29/17 1138         Objective: Vitals:   09/30/17 0405 09/30/17 0500 09/30/17 0800 09/30/17 0801  BP: 138/64  (!) 144/66   Pulse: 99 91 90   Resp:      Temp:    97.7 F (36.5 C)  TempSrc:    Oral  SpO2: 90% (!) 88% (!) 87%   Weight:      Height:        Intake/Output Summary (Last 24 hours) at 09/30/2017 0955 Last data filed at 09/30/2017 0600 Gross per 24 hour  Intake 450 ml  Output  750 ml  Net -300 ml   Filed Weights   05-Oct-2017 1844  Weight: (!) 140.6 kg (310 lb)    Examination: General exam: Appears comfortable  HEENT: PERRLA, oral mucosa moist, no sclera icterus or thrush Respiratory system: Clear to auscultation. Respiratory effort normal. Cardiovascular system: S1 & S2 heard, RRR.   Gastrointestinal system: Abdomen soft, obese,  Tender across right upper and mid abdomen going to the back, nondistended. Normal bowel sound. No organomegaly Central nervous system: Alert and oriented. No focal neurological deficits. Extremities: No cyanosis, clubbing or edema Skin: No rashes or ulcers Psychiatry:  Mood & affect appropriate.     Data Reviewed: I have personally reviewed following labs and imaging studies  CBC: Recent Labs    Lab 10-05-17 2000 09/28/17 0207  WBC 10.1 9.5  NEUTROABS 8.1*  --   HGB 16.5 14.7  HCT 44.8 41.6  MCV 87.5 88.1  PLT 134* 301*   Basic Metabolic Panel: Recent Labs  Lab October 05, 2017 2000 09/28/17 0207  09/28/17 1953 09/29/17 0156 09/29/17 0747 09/29/17 1213 09/29/17 2354  NA 117* 121*   < > 119* 116* 117* 118* 118*  K 4.7 4.3   < > 4.4 5.0 4.6 4.6 4.6  CL 79* 84*   < > 84* 82* 82* 82* 81*  CO2 27 27   < > 28 25 26 25 25   GLUCOSE 143* 164*   < > 152* 149* 148* 170* 210*  BUN 9 11   < > 11 11 11 12 14   CREATININE 0.55* 0.60*   < > 0.57* 0.36* 0.54* 0.60* 0.74  CALCIUM 8.8* 8.3*   < > 8.2* 8.1* 8.3* 8.5* 8.6*  MG 1.8 1.6*  --   --  1.7  --   --   --   PHOS  --  2.8  --   --  2.6  --   --   --    < > = values in this interval not displayed.   GFR: Estimated Creatinine Clearance: 129.9 mL/min (by C-G formula based on SCr of 0.74 mg/dL). Liver Function Tests: Recent Labs  Lab 2017-10-05 2000 09/28/17 0754  AST 133* 103*  ALT 224* 176*  ALKPHOS 156* 145*  BILITOT 12.3* 11.1*  PROT 6.9 6.2*  ALBUMIN 3.1* 2.7*   Recent Labs  Lab 05-Oct-2017 2000  LIPASE 85*   Recent Labs  Lab 05-Oct-2017 2316  AMMONIA 33   Coagulation Profile: Recent Labs  Lab 09/28/17 0005  INR 1.09   Cardiac Enzymes: Recent Labs  Lab October 05, 2017 2000 09/28/17 0207  CKTOTAL  --  58  TROPONINI <0.03  --    BNP (last 3 results) No results for input(s): PROBNP in the last 8760 hours. HbA1C: Recent Labs    09/28/17 0207  HGBA1C 7.2*   CBG: Recent Labs  Lab 09/29/17 0842 09/29/17 1213 09/29/17 1646 09/29/17 2132 09/30/17 0736  GLUCAP 161* 191* 217* 146* 155*   Lipid Profile: No results for input(s): CHOL, HDL, LDLCALC, TRIG, CHOLHDL, LDLDIRECT in the last 72 hours. Thyroid Function Tests: Recent Labs    09/28/17 0207  TSH 1.904   Anemia Panel: No results for input(s): VITAMINB12, FOLATE, FERRITIN, TIBC, IRON, RETICCTPCT in the last 72 hours. Urine analysis:    Component Value  Date/Time   COLORURINE AMBER (A) 09/28/2017 0030   APPEARANCEUR CLEAR 09/28/2017 0030   LABSPEC 1.017 09/28/2017 0030   PHURINE 5.0 09/28/2017 0030   GLUCOSEU 50 (A) 09/28/2017 0030   HGBUR SMALL (A)  09/28/2017 0030   BILIRUBINUR MODERATE (A) 09/28/2017 0030   KETONESUR NEGATIVE 09/28/2017 0030   PROTEINUR 100 (A) 09/28/2017 0030   NITRITE NEGATIVE 09/28/2017 0030   LEUKOCYTESUR NEGATIVE 09/28/2017 0030   Sepsis Labs: @LABRCNTIP (procalcitonin:4,lacticidven:4) ) Recent Results (from the past 240 hour(s))  MRSA PCR Screening     Status: None   Collection Time: 09/28/17 12:00 AM  Result Value Ref Range Status   MRSA by PCR NEGATIVE NEGATIVE Final    Comment:        The GeneXpert MRSA Assay (FDA approved for NASAL specimens only), is one component of a comprehensive MRSA colonization surveillance program. It is not intended to diagnose MRSA infection nor to guide or monitor treatment for MRSA infections. Performed at [facility], [place] [address] [place], [place] [address]   Urine culture     Status: Abnormal   Collection Time: 09/28/17 12:30 AM  Result Value Ref Range Status   Specimen Description   Final    URINE, CATHETERIZED Performed at [facility] [address] [place], [place] [address]    Special Requests   Final    Normal Performed at [facility], [place] [address], [place], [place] [address]    Culture (A)  Final    <10,000 COLONIES/mL INSIGNIFICANT GROWTH Performed at [place] [address] [place], [place] [address]    Report Status 09/29/2017 FINAL  Final         Radiology Studies: No results found.    Scheduled Meds: . amLODipine  5 mg Oral QHS  . bisacodyl  10 mg Rectal Daily  . chlorpheniramine-HYDROcodone  5 mL Oral Q12H  . demeclocycline  300 mg Oral Q12H  . dextromethorphan-guaiFENesin  1 tablet Oral BID  . diclofenac sodium  2 g Topical QID  . famotidine  10 mg  Oral Daily  . furosemide  160 mg Oral BID  . insulin aspart  0-15 Units Subcutaneous TID WC  . insulin aspart  0-5 Units Subcutaneous QHS  . ipratropium-albuterol  3 mL Nebulization BID  . nicotine  21 mg Transdermal Daily  . omega-3 acid ethyl esters  1 g Oral BID  . senna  1 tablet Oral BID   Continuous Infusions:   LOS: 3 days    Time spent in minutes: 35    Debbe Odea, MD [facility] Pager: [email or web address] Password [email or web address] 09/30/2017, 9:55 AM

## 2017-10-01 LAB — BASIC METABOLIC PANEL
Anion gap: 14 (ref 5–15)
Anion gap: 13 (ref 5–15)
BUN: 24 mg/dL — AB (ref 6–20)
BUN: 35 mg/dL — AB (ref 6–20)
CO2: 25 mmol/L (ref 22–32)
CO2: 27 mmol/L (ref 22–32)
Calcium: 8.9 mg/dL (ref 8.9–10.3)
Calcium: 8.9 mg/dL (ref 8.9–10.3)
Chloride: 81 mmol/L — ABNORMAL LOW (ref 101–111)
Chloride: 81 mmol/L — ABNORMAL LOW (ref 101–111)
Creatinine, Ser: 0.91 mg/dL (ref 0.61–1.24)
Creatinine, Ser: 1.19 mg/dL (ref 0.61–1.24)
GFR calc Af Amer: >60 mL/min (ref >60)
GFR calc non Af Amer: >60 mL/min (ref >60)
GLUCOSE: 162 mg/dL — AB (ref 65–99)
Glucose, Bld: 159 mg/dL — ABNORMAL HIGH (ref 65–99)
POTASSIUM: 4.8 mmol/L (ref 3.5–5.1)
POTASSIUM: 4.9 mmol/L (ref 3.5–5.1)
SODIUM: 120 mmol/L — AB (ref 135–145)
Sodium: 121 mmol/L — ABNORMAL LOW (ref 135–145)

## 2017-10-01 LAB — GLUCOSE, CAPILLARY
GLUCOSE-CAPILLARY: 155 mg/dL — AB (ref 65–99)
Glucose-Capillary: 142 mg/dL — ABNORMAL HIGH (ref 65–99)
Glucose-Capillary: 174 mg/dL — ABNORMAL HIGH (ref 65–99)
Glucose-Capillary: 142 mg/dL — ABNORMAL HIGH (ref 65–99)

## 2017-10-01 MED ORDER — IPRATROPIUM-ALBUTEROL 0.5-2.5 (3) MG/3ML IN SOLN
3.0000 mL | Freq: Three times a day (TID) | RESPIRATORY_TRACT | Status: DC
  Administered 2017-10-02 – 2017-10-08 (×16): 3 mL via RESPIRATORY_TRACT
  Filled 2017-10-01 (×20): qty 3

## 2017-10-01 NOTE — Progress Notes (Signed)
[facility] ROUNDING NOTE   Subjective:   This is a very unfortunate situation of a 72 year old smoker with history of diabetes and hypertension that was admitted with apical lung mass and also lymphadenopathy and possible liver metastasis. He was admitted with a strong suspicion of SIADH and is being managed with fluid restriction and demeclocycline and lasix    He has a sodium of 120 today . Urine output looks excellent on lasix   Objective:  Vital signs in last 24 hours:  Temp:  [97.6 F (36.4 C)-98.9 F (37.2 C)] 97.6 F (36.4 C) (06/18 0800) Pulse Rate:  [95-110] 95 (06/18 0945) Resp:  [15-24] 22 (06/18 0945) BP: (103-146)/(51-78) 103/51 (06/18 0945) SpO2:  [89 %-98 %] 90 % (06/18 0945)  Weight change:  Filed Weights   09/27/17 1844  Weight: (!) 310 lb (140.6 kg)    Intake/Output: I/O last 3 completed shifts: In: 480 [P.O.:480] Out: 3075 [Urine:3075]   Intake/Output this shift:  No intake/output data recorded.  CVS- RRR RS- CTA ABD- BS present soft non-distended  Normal bowel sound  EXT- no edema   Basic Metabolic Panel: Recent Labs  Lab 09/27/17 2000 09/28/17 0207  09/29/17 0156 09/29/17 0747 09/29/17 1213 09/29/17 2354 09/30/17 1147 09/30/17 2326  NA 117* 121*   < > 116* 117* 118* 118* 120* 120*  K 4.7 4.3   < > 5.0 4.6 4.6 4.6 4.7 4.8  CL 79* 84*   < > 82* 82* 82* 81* 81* 81*  CO2 27 27   < > 25 26 25 25 27 25   GLUCOSE 143* 164*   < > 149* 148* 170* 210* 157* 159*  BUN 9 11   < > 11 11 12 14 18  24*  CREATININE 0.55* 0.60*   < > 0.36* 0.54* 0.60* 0.74 0.74 0.91  CALCIUM 8.8* 8.3*   < > 8.1* 8.3* 8.5* 8.6* 8.7* 8.9  MG 1.8 1.6*  --  1.7  --   --   --   --   --   PHOS  --  2.8  --  2.6  --   --   --   --   --    < > = values in this interval not displayed.    Liver Function Tests: Recent Labs  Lab 09/27/17 2000 09/28/17 0754  AST 133* 103*  ALT 224* 176*  ALKPHOS 156* 145*  BILITOT 12.3* 11.1*  PROT 6.9 6.2*  ALBUMIN 3.1*  2.7*   Recent Labs  Lab 09/27/17 2000  LIPASE 85*   Recent Labs  Lab 09/27/17 2316  AMMONIA 33    CBC: Recent Labs  Lab 09/27/17 2000 09/28/17 0207  WBC 10.1 9.5  NEUTROABS 8.1*  --   HGB 16.5 14.7  HCT 44.8 41.6  MCV 87.5 88.1  PLT 134* 127*    Cardiac Enzymes: Recent Labs  Lab 09/27/17 2000 09/28/17 0207  CKTOTAL  --  98  TROPONINI <0.03  --     BNP: Invalid input(s): POCBNP  CBG: Recent Labs  Lab 09/30/17 0736 09/30/17 1140 09/30/17 1743 09/30/17 2148 10/01/17 0742  GLUCAP 155* 157* 129* 154* 142*    Microbiology: Results for orders placed or performed during the hospital encounter of 09/27/17  MRSA PCR Screening     Status: None   Collection Time: 09/28/17 12:00 AM  Result Value Ref Range Status   MRSA by PCR NEGATIVE NEGATIVE Final    Comment:  The GeneXpert MRSA Assay (FDA approved for NASAL specimens only), is one component of a comprehensive MRSA colonization surveillance program. It is not intended to diagnose MRSA infection nor to guide or monitor treatment for MRSA infections. Performed at [facility], [place] [address], [place], [place] [address]   Urine culture     Status: Abnormal   Collection Time: 09/28/17 12:30 AM  Result Value Ref Range Status   Specimen Description   Final    URINE, CATHETERIZED Performed at [facility] [address] [place], [place] [address]    Special Requests   Final    Normal Performed at [facility], [place] [address], [place], [place] [address]    Culture (A)  Final    <10,000 COLONIES/mL INSIGNIFICANT GROWTH Performed at [place] [address] [place], [place] [address]    Report Status 09/29/2017 FINAL  Final    Coagulation Studies: No results for input(s): LABPROT, INR in the last 72 hours.  Urinalysis: No results for input(s): COLORURINE, LABSPEC, PHURINE, GLUCOSEU, HGBUR, BILIRUBINUR, KETONESUR, PROTEINUR,  UROBILINOGEN, NITRITE, LEUKOCYTESUR in the last 72 hours.  Invalid input(s): APPERANCEUR    Imaging: US Biopsy (liver)  Result Date: 09/30/2017 INDICATION: Concern for metastatic lung cancer. Please perform ultrasound-guided liver lesion biopsy for tissue diagnostic purposes. EXAM: ULTRASOUND GUIDED LIVER LESION BIOPSY COMPARISON:  CT the chest, abdomen and pelvis - 09/23/2017; CT abdomen pelvis - 09/20/2017 MEDICATIONS: None ANESTHESIA/SEDATION: Fentanyl 50 mcg IV; Versed 1 mg IV Total Moderate Sedation time:  12 Minutes. The patient's level of consciousness and vital signs were monitored continuously by radiology nursing throughout the procedure under my direct supervision. COMPLICATIONS: None immediate. PROCEDURE: Informed written consent was obtained from the patient after a discussion of the risks, benefits and alternatives to treatment. The patient understands and consents the procedure. A timeout was performed prior to the initiation of the procedure. Ultrasound scanning was performed of the right upper abdominal quadrant demonstrates an approximately 3.2 by 2.3 cm mass within the right lobe of the liver (image 25) compatible with ill-defined mass seen on preceding abdominal CT performed 09/20/2017 on image 30, series 2. The procedure was planned. The right upper abdominal quadrant was prepped and draped in the usual sterile fashion. The overlying soft tissues were anesthetized with 1% lidocaine with epinephrine. A 17 gauge, 6.8 cm co-axial needle was advanced into a peripheral aspect of the lesion. This was followed by 5 core biopsies with an 18 gauge core device under direct ultrasound guidance. The coaxial needle tract was embolized with a small amount of Gel-Foam slurry and superficial hemostasis was obtained with manual compression. Post procedural scanning was negative for definitive area of hemorrhage or additional complication. A dressing was placed. The patient tolerated the procedure well  without immediate post procedural complication. IMPRESSION: Technically successful ultrasound guided core needle biopsy of indeterminate mass within the right lobe of the liver. Electronically Signed   By: Sandi Mariscal M.D.   On: 09/30/2017 17:26     Medications:    . amLODipine  5 mg Oral QHS  . bisacodyl  10 mg Rectal Daily  . chlorpheniramine-HYDROcodone  5 mL Oral Q12H  . demeclocycline  300 mg Oral Q12H  . dextromethorphan-guaiFENesin  1 tablet Oral BID  . famotidine  10 mg Oral Daily  . furosemide  160 mg Oral BID  . insulin aspart  0-15 Units Subcutaneous TID WC  . insulin aspart  0-5 Units Subcutaneous QHS  .  ipratropium-albuterol  3 mL Nebulization BID  . nicotine  21 mg Transdermal Daily  . omega-3 acid ethyl esters  1 g Oral BID  . senna  1 tablet Oral BID   albuterol, bisacodyl, ondansetron **OR** ondansetron (ZOFRAN) IV, polyethylene glycol, traMADol  Assessment/ Plan:   Hyponatremia  This appears to be consistent with SIADH   Noted normal TSH  With urine osm 532 and urine sodium of 42  He has normal renal function   His sodium really has not changed much in the last 24 hours and would continue the current coarse today   Tolvaptan is not being recommended due to the increase in LFT. There is use of topical diclofenac and as this is an nsaid  I would probably stop this although this is topical and not sure how much systemic absorption is occurring   Metastatic cancer to liver  With right apical lung mass  Known smoker with biopsy of liver performed 6/17  Diabetes  Patients outpatient metformin is being held at this point      LOS: 4 Ledarrius Beauchaine W @TODAY @11 :15 AM

## 2017-10-01 NOTE — Progress Notes (Signed)
While RN was assisting PT with ambulation, RN noticed that pt.  appears more drowsy than this morning. He is oriented to self, situation. Disoriented to time and place. He mentioned that he felt a little dizzy when earlier NT helped transfer him to a BSC.    Pt. dangled his feet on the side of the bed for approx. 10 minutes. We took his orthostatic BP, but no significant changes noted.  Pt. is back in bed. BP 100/49.

## 2017-10-01 NOTE — Progress Notes (Signed)
PROGRESS NOTE    Robert Compton   ZOX:[id number]  DOB: Aug 07, 1945  DOA: 09/27/2017 PCP: [name], MD   Brief Narrative:  Robert Compton is a 72 y/o male with DM, HTN, obesity, smoking who presents for severe fatigue and is found to have a sodium level of 117.   He was seen in the ED on 09/20/17 for abdominal distension and hematuria dn under went a CT renal stone study which found liver masses. She was followed up by a CT chest/abd/pelvis with contrast by his PCP which found liver masses and a right apical lung mass with lymphadenopathy. He is admitted for hyponatremia  Subjective: No new complaints. Has not been out of bed since hospital admission. Will get out of bed today. Cough has improved and hemoptysis has not recurred. RUQ pain is better today.    Assessment & Plan:   Active Problems:   Hyponatremia - 3/15- suspect SIADH in relation to possible small cell lung CA- sodium is 119 today- have started fluid restriction of 1 L today- follow sodium levels closely and cont to follow in SDU - 3/16- sodium is back down to 117 after going up to 121 despite fluid restriction- have asked for nephro assistance in correcting it- started on lasix 160 BID and Demeclocycline - sodium 120 today- remains asymptomatic- cont to follow in SDU until improvement seen    Metastatic cancer to liver- Right apical lung mass- elevated LFTs - suspecting lung CA to be the source- Oncology would like a liver biopsy- MRI brain ordered today to r/u brain mets - IR to obtained liver biopsy yesterday - await results- Dr Julien Nordmann is following  Hematemesis Cough/ wheezing 2ppd smoker -   due to lung mass vs acute bronchitis  - probable COPD although never diagnosed- cont  Nebs and Mucinex - nicoderm patch ordered- provided counseling - hemoptysis occurred on 6/16 and has subsequently improved  Hypoxic at night - likely has OSA- will need outpt sleep study and will likely need to go home on O2- cont O2  PRN pulse ox < 88%  Abdominal pain - for which he came to the ED last week and was prescribed MS Contin- he states that he took 1 pill and "almost died" and did not take more - he is sore in right upper abdomen- suspect muscular- K pad and Voltaren gel ordered- renal recommended d/c Voltaren gel as is can prevent hyponatremia from resolving- pain is better today    DM (diabetes mellitus), type 2  A1c is 7.2 - holding metformin in the hospital- cont ISS   Severe constipation - he states he has not had a BM In days- Dulcolax suppository order which resolved the issue  Hematuria - small amount on Hemoglobin noted on UA but no RBCs seen    Essential hypertension - Avapro d/c'd by renal team due to hyponatremia   DVT prophylaxis: SCDs Code Status: Full code Family Communication:  Disposition Plan: follow in SDU until sodium improves- start ambulating Consultants:   Oncology  IR  Nephrology Procedures:   Liver biopsy Antimicrobials:  Anti-infectives (From admission, onward)   Start     Dose/Rate Route Frequency Ordered Stop   09/29/17 1500  demeclocycline (DECLOMYCIN) tablet 300 mg     300 mg Oral Every 12 hours 09/29/17 1138         Objective: Vitals:   10/01/17 0600 10/01/17 0800 10/01/17 0825 10/01/17 0945  BP:    (!) 103/51  Pulse: 95  96 95  Resp: 18  (!) 24 (!) 22  Temp:  97.6 F (36.4 C)    TempSrc:  Oral    SpO2: (!) 89%  (!) 89% 90%  Weight:      Height:        Intake/Output Summary (Last 24 hours) at 10/01/2017 1010 Last data filed at 10/01/2017 0640 Gross per 24 hour  Intake 480 ml  Output 2350 ml  Net -1870 ml   Filed Weights   09/27/17 1844  Weight: (!) 140.6 kg (310 lb)    Examination: General exam: Appears comfortable  HEENT: PERRLA, oral mucosa moist, no sclera icterus or thrush Respiratory system: Clear to auscultation. Respiratory effort normal. Cardiovascular system: S1 & S2 heard, RRR.   Gastrointestinal system: Abdomen soft,  obese,  Normal bowel sound. No organomegaly Central nervous system: Alert and oriented. No focal neurological deficits. Extremities: No cyanosis, clubbing or edema Skin: No rashes or ulcers Psychiatry:  Mood & affect appropriate.     Data Reviewed: I have personally reviewed following labs and imaging studies  CBC: Recent Labs  Lab 09/27/17 2000 09/28/17 0207  WBC 10.1 9.5  NEUTROABS 8.1*  --   HGB 16.5 14.7  HCT 44.8 41.6  MCV 87.5 88.1  PLT 134* 720*   Basic Metabolic Panel: Recent Labs  Lab 09/27/17 2000 09/28/17 0207  09/29/17 0156 09/29/17 0747 09/29/17 1213 09/29/17 2354 09/30/17 1147 09/30/17 2326  NA 117* 121*   < > 116* 117* 118* 118* 120* 120*  K 4.7 4.3   < > 5.0 4.6 4.6 4.6 4.7 4.8  CL 79* 84*   < > 82* 82* 82* 81* 81* 81*  CO2 27 27   < > 25 26 25 25 27 25   GLUCOSE 143* 164*   < > 149* 148* 170* 210* 157* 159*  BUN 9 11   < > 11 11 12 14 18  24*  CREATININE 0.55* 0.60*   < > 0.36* 0.54* 0.60* 0.74 0.74 0.91  CALCIUM 8.8* 8.3*   < > 8.1* 8.3* 8.5* 8.6* 8.7* 8.9  MG 1.8 1.6*  --  1.7  --   --   --   --   --   PHOS  --  2.8  --  2.6  --   --   --   --   --    < > = values in this interval not displayed.   GFR: Estimated Creatinine Clearance: 114.2 mL/min (by C-G formula based on SCr of 0.91 mg/dL). Liver Function Tests: Recent Labs  Lab 09/27/17 2000 09/28/17 0754  AST 133* 103*  ALT 224* 176*  ALKPHOS 156* 145*  BILITOT 12.3* 11.1*  PROT 6.9 6.2*  ALBUMIN 3.1* 2.7*   Recent Labs  Lab 09/27/17 2000  LIPASE 85*   Recent Labs  Lab 09/27/17 2316  AMMONIA 33   Coagulation Profile: Recent Labs  Lab 09/28/17 0005  INR 1.09   Cardiac Enzymes: Recent Labs  Lab 09/27/17 2000 09/28/17 0207  CKTOTAL  --  61  TROPONINI <0.03  --    BNP (last 3 results) No results for input(s): PROBNP in the last 8760 hours. HbA1C: No results for input(s): HGBA1C in the last 72 hours. CBG: Recent Labs  Lab 09/30/17 0736 09/30/17 1140  09/30/17 1743 09/30/17 2148 10/01/17 0742  GLUCAP 155* 157* 129* 154* 142*   Lipid Profile: No results for input(s): CHOL, HDL, LDLCALC, TRIG, CHOLHDL, LDLDIRECT in the last 72 hours. Thyroid Function Tests: No results for input(s):  TSH, T4TOTAL, FREET4, T3FREE, THYROIDAB in the last 72 hours. Anemia Panel: No results for input(s): VITAMINB12, FOLATE, FERRITIN, TIBC, IRON, RETICCTPCT in the last 72 hours. Urine analysis:    Component Value Date/Time   COLORURINE AMBER (A) 09/28/2017 0030   APPEARANCEUR CLEAR 09/28/2017 0030   LABSPEC 1.017 09/28/2017 0030   PHURINE 5.0 09/28/2017 0030   GLUCOSEU 50 (A) 09/28/2017 0030   HGBUR SMALL (A) 09/28/2017 0030   BILIRUBINUR MODERATE (A) 09/28/2017 0030   KETONESUR NEGATIVE 09/28/2017 0030   PROTEINUR 100 (A) 09/28/2017 0030   NITRITE NEGATIVE 09/28/2017 0030   LEUKOCYTESUR NEGATIVE 09/28/2017 0030   Sepsis Labs: @LABRCNTIP (procalcitonin:4,lacticidven:4) ) Recent Results (from the past 240 hour(s))  MRSA PCR Screening     Status: None   Collection Time: 09/28/17 12:00 AM  Result Value Ref Range Status   MRSA by PCR NEGATIVE NEGATIVE Final    Comment:        The GeneXpert MRSA Assay (FDA approved for NASAL specimens only), is one component of a comprehensive MRSA colonization surveillance program. It is not intended to diagnose MRSA infection nor to guide or monitor treatment for MRSA infections. Performed at [facility], [place] [address], [place], [place] [address]   Urine culture     Status: Abnormal   Collection Time: 09/28/17 12:30 AM  Result Value Ref Range Status   Specimen Description   Final    URINE, CATHETERIZED Performed at [place] [address] [place], [place] [address]    Special Requests   Final    Normal Performed at [facility], [place] [address] [place], [place] [address]    Culture (A)  Final    <10,000 COLONIES/mL INSIGNIFICANT  GROWTH Performed at [place] [address], [place], [place] [address]    Report Status 09/29/2017 FINAL  Final         Radiology Studies: US Biopsy (liver)  Result Date: 09/30/2017 INDICATION: Concern for metastatic lung cancer. Please perform ultrasound-guided liver lesion biopsy for tissue diagnostic purposes. EXAM: ULTRASOUND GUIDED LIVER LESION BIOPSY COMPARISON:  CT the chest, abdomen and pelvis - 09/23/2017; CT abdomen pelvis - 09/20/2017 MEDICATIONS: None ANESTHESIA/SEDATION: Fentanyl 50 mcg IV; Versed 1 mg IV Total Moderate Sedation time:  12 Minutes. The patient's level of consciousness and vital signs were monitored continuously by radiology nursing throughout the procedure under my direct supervision. COMPLICATIONS: None immediate. PROCEDURE: Informed written consent was obtained from the patient after a discussion of the risks, benefits and alternatives to treatment. The patient understands and consents the procedure. A timeout was performed prior to the initiation of the procedure. Ultrasound scanning was performed of the right upper abdominal quadrant demonstrates an approximately 3.2 by 2.3 cm mass within the right lobe of the liver (image 25) compatible with ill-defined mass seen on preceding abdominal CT performed 09/20/2017 on image 30, series 2. The procedure was planned. The right upper abdominal quadrant was prepped and draped in the usual sterile fashion. The overlying soft tissues were anesthetized with 1% lidocaine with epinephrine. A 17 gauge, 6.8 cm co-axial needle was advanced into a peripheral aspect of the lesion. This was followed by 5 core biopsies with an 18 gauge core device under direct ultrasound guidance. The coaxial needle tract was embolized with a small amount of Gel-Foam slurry and superficial hemostasis was obtained with manual compression. Post procedural scanning was negative for definitive area of hemorrhage or additional complication. A dressing  was placed. The  patient tolerated the procedure well without immediate post procedural complication. IMPRESSION: Technically successful ultrasound guided core needle biopsy of indeterminate mass within the right lobe of the liver. Electronically Signed   By: Sandi Mariscal M.D.   On: 09/30/2017 17:26      Scheduled Meds: . amLODipine  5 mg Oral QHS  . bisacodyl  10 mg Rectal Daily  . chlorpheniramine-HYDROcodone  5 mL Oral Q12H  . demeclocycline  300 mg Oral Q12H  . dextromethorphan-guaiFENesin  1 tablet Oral BID  . famotidine  10 mg Oral Daily  . furosemide  160 mg Oral BID  . insulin aspart  0-15 Units Subcutaneous TID WC  . insulin aspart  0-5 Units Subcutaneous QHS  . ipratropium-albuterol  3 mL Nebulization BID  . nicotine  21 mg Transdermal Daily  . omega-3 acid ethyl esters  1 g Oral BID  . senna  1 tablet Oral BID   Continuous Infusions:   LOS: 4 days    Time spent in minutes: 35    Debbe Odea, MD [facility] Pager: [email or web address] Password [email or web address] 10/01/2017, 10:10 AM

## 2017-10-01 NOTE — Progress Notes (Signed)
Occupational Therapy Treatment Patient Details Name: Robert Compton MRN: [id number] DOB: Jul 04, 1945 Today's Date: 10/01/2017    History of present illness 72 y.o. male with medical history significant of hypertension diabetes type 2, right TKR and  new diagnosis of metastatic cancer likely lung with liver mets admitted due to weakness and nausea   OT comments  Pt agreeable to sitting EOB.  C/o dizziness earlier when standing.  BPs soft and dropped after several minutes in sitting.  Likely orthostatic. RN present with OT  Follow Up Recommendations  SNF    Equipment Recommendations  3 in 1 bedside commode(wide)    Recommendations for Other Services      Precautions / Restrictions Precautions Precautions: Fall Precaution Comments: monitor vitals:  likely orthostatic Restrictions Weight Bearing Restrictions: No       Mobility Bed Mobility         Supine to sit: Min assist HOB raised Sit to supine: Mod assist;+2 for physical assistance   General bed mobility comments: RN present and assisted with back to bed  Transfers                      Balance   Sitting-balance support: Bilateral upper extremity supported Sitting balance-Leahy Scale: Poor(tendency to fall posteriorly)                                     ADL either performed or assessed with clinical judgement   ADL                                               Vision       Perception     Praxis      Cognition Arousal/Alertness: Awake/alert Behavior During Therapy: WFL for tasks assessed/performed Overall Cognitive Status: Impaired/Different from baseline                                 General Comments: not oriented to date/place but knew he was in a hospital        Exercises     Shoulder Instructions       General Comments sat at EOB; did not want to perform any adls.  Needed bil UEs for support to avoid posterior lean.  Pt talking and  winking during session.  BPs were low (see vitals section of chart) and he reported that he got dizzy when he stood up to use BSC this am.  Pt initially only wanted to sit up for a few minutes and then did not want to lie back down.  When BP dropped further, RN assisted me in helping him to lie down. Pt used bil LEs to help push himself up in bed     Pertinent Vitals/ Pain       Pain Assessment: No/denies pain  Home Living                                          Prior Functioning/Environment              Frequency  Min 2X/week        Progress  Toward Goals  OT Goals(current goals can now be found in the care plan section)  Progress towards OT goals: Not progressing toward goals - comment(due to decreased BP; Na+ also low)     Plan Discharge plan needs to be updated    Co-evaluation                 AM-PAC PT "6 Clicks" Daily Activity     Outcome Measure   Help from another person eating meals?: None Help from another person taking care of personal grooming?: A Little Help from another person toileting, which includes using toliet, bedpan, or urinal?: A Lot Help from another person bathing (including washing, rinsing, drying)?: A Lot Help from another person to put on and taking off regular upper body clothing?: A Little Help from another person to put on and taking off regular lower body clothing?: Total 6 Click Score: 15    End of Session    OT Visit Diagnosis: Unsteadiness on feet (R26.81);Other abnormalities of gait and mobility (R26.89);Muscle weakness (generalized) (M62.81)   Activity Tolerance Patient limited by fatigue(BP)   Patient Left in bed;with call bell/phone within reach;with bed alarm set   Nurse Communication          Time: 3295-1884 OT Time Calculation (min): 30 min  Charges: OT General Charges $OT Visit: 1 Visit OT Treatments $Therapeutic Activity: 23-37 mins  Robert Compton,  OTR/L [phone] 10/01/2017   Robert Compton 10/01/2017, 2:38 PM

## 2017-10-02 LAB — BASIC METABOLIC PANEL
Anion gap: 10 (ref 5–15)
Anion gap: 13 (ref 5–15)
BUN: 39 mg/dL — AB (ref 6–20)
BUN: 44 mg/dL — AB (ref 6–20)
CHLORIDE: 82 mmol/L — AB (ref 101–111)
CHLORIDE: 82 mmol/L — AB (ref 101–111)
CO2: 28 mmol/L (ref 22–32)
CO2: 30 mmol/L (ref 22–32)
CREATININE: 1.03 mg/dL (ref 0.61–1.24)
CREATININE: 1.3 mg/dL — AB (ref 0.61–1.24)
Calcium: 8.6 mg/dL — ABNORMAL LOW (ref 8.9–10.3)
Calcium: 8.8 mg/dL — ABNORMAL LOW (ref 8.9–10.3)
GFR calc Af Amer: >60 mL/min (ref >60)
GFR calc Af Amer: >60 mL/min (ref >60)
GFR calc non Af Amer: 54 mL/min — ABNORMAL LOW (ref >60)
GFR calc non Af Amer: >60 mL/min (ref >60)
GLUCOSE: 129 mg/dL — AB (ref 65–99)
GLUCOSE: 177 mg/dL — AB (ref 65–99)
POTASSIUM: 4.7 mmol/L (ref 3.5–5.1)
POTASSIUM: 4.9 mmol/L (ref 3.5–5.1)
SODIUM: 122 mmol/L — AB (ref 135–145)
Sodium: 123 mmol/L — ABNORMAL LOW (ref 135–145)

## 2017-10-02 LAB — GLUCOSE, CAPILLARY
GLUCOSE-CAPILLARY: 122 mg/dL — AB (ref 65–99)
GLUCOSE-CAPILLARY: 192 mg/dL — AB (ref 65–99)
Glucose-Capillary: 196 mg/dL — ABNORMAL HIGH (ref 65–99)
Glucose-Capillary: 156 mg/dL — ABNORMAL HIGH (ref 65–99)

## 2017-10-02 NOTE — Progress Notes (Signed)
PROGRESS NOTE    Robert Compton   ZMO:[id number]  DOB: [birth year]  DOA: 09/27/2017 PCP: [name], MD   Brief Narrative:  Robert Compton is a 72 y/o male with DM, HTN, obesity, smoking who presents for severe fatigue and is found to have a sodium level of 117.   He was seen in the ED on 09/20/17 for abdominal distension and hematuria dn under went a CT renal stone study which found liver masses. She was followed up by a CT chest/abd/pelvis with contrast by his PCP which found liver masses and a right apical lung mass with lymphadenopathy. He is admitted for hyponatremia  Subjective: No recurrence of RUQ pain. Cough is controlled. Was able to get out of bed yesterday.    Assessment & Plan:   Active Problems:   Hyponatremia - 3/15- suspect SIADH in relation to possible small cell lung CA- sodium is 119 today- have started fluid restriction of 1 L today- follow sodium levels closely and cont to follow in SDU - 3/16- sodium is back down to 117 after going up to 121 despite fluid restriction- have asked for nephro assistance in correcting it- started on lasix 160 BID and Demeclocycline - sodium 122 today- remains asymptomatic- cont to follow in SDU until improvement seen- d/c to SNF when sodium is improved    Metastatic cancer to liver- Right apical lung mass- elevated LFTs - suspecting lung CA to be the source- Oncology would like a liver biopsy- MRI brain ordered today to r/u brain mets - IR to obtained liver biopsy  - await results- Dr Julien Nordmann is following  Hematemesis Cough/ wheezing 2ppd smoker -   due to lung mass vs acute bronchitis  - probable COPD although never diagnosed- cont  Nebs and Mucinex - nicoderm patch ordered- provided counseling - hemoptysis occurred on 6/16 and has subsequently improved  Hypoxic at night - likely has OSA- will need outpt sleep study and will likely need to go home on O2- cont O2 PRN pulse ox < 88%  Abdominal pain  - for which he came to  the ED last week and was prescribed MS Contin- he states that he took 1 pill and "almost died" and did not take more - he was sore in right upper abdomen- suspect muscular- K pad and Voltaren gel ordered- renal recommended d/c Voltaren gel as is can prevent hyponatremia from resolving- pain has  Resolved by 10/01/17    DM (diabetes mellitus), type 2  A1c is 7.2 - holding metformin in the hospital- cont ISS   Severe constipation - he states he has not had a BM In days- Dulcolax suppository order which resolved the issue  Hematuria - small amount on Hemoglobin noted on UA but no RBCs seen    Essential hypertension - Avapro d/c'd by renal team due to hyponatremia   DVT prophylaxis: SCDs Code Status: Full code Family Communication:  Disposition Plan: follow in SDU until sodium improves to > 125 - starting to ambulating Consultants:   Oncology  IR  Nephrology Procedures:   Liver biopsy Antimicrobials:  Anti-infectives (From admission, onward)   Start     Dose/Rate Route Frequency Ordered Stop   09/29/17 1500  demeclocycline (DECLOMYCIN) tablet 300 mg     300 mg Oral Every 12 hours 09/29/17 1138         Objective: Vitals:   10/02/17 0000 10/02/17 0400 10/02/17 0600 10/02/17 0743  BP: (!) 118/49 100/64 (!) 105/48   Pulse: 93 93 92  Resp: 17 18 16    Temp: 99.8 F (37.7 C) 99.1 F (37.3 C)  98.3 F (36.8 C)  TempSrc: Axillary Axillary  Oral  SpO2: 92% 93% 91%   Weight:      Height:        Intake/Output Summary (Last 24 hours) at 10/02/2017 0904 Last data filed at 10/02/2017 0600 Gross per 24 hour  Intake 702 ml  Output 1275 ml  Net -573 ml   Filed Weights   09/27/17 1844  Weight: (!) 140.6 kg (310 lb)    Examination: General exam: Appears comfortable  HEENT: PERRLA, oral mucosa moist, no sclera icterus or thrush Respiratory system: Clear to auscultation. Respiratory effort normal. Cardiovascular system: S1 & S2 heard, RRR.   Gastrointestinal system:  Abdomen soft, obese,  Normal bowel sound. No organomegaly Central nervous system: Alert and oriented. No focal neurological deficits. Extremities: No cyanosis, clubbing or edema Skin: No rashes or ulcers Psychiatry:  Mood & affect appropriate.     Data Reviewed: I have personally reviewed following labs and imaging studies  CBC: Recent Labs  Lab 09/27/17 2000 09/28/17 0207  WBC 10.1 9.5  NEUTROABS 8.1*  --   HGB 16.5 14.7  HCT 44.8 41.6  MCV 87.5 88.1  PLT 134* 449*   Basic Metabolic Panel: Recent Labs  Lab 09/27/17 2000 09/28/17 0207  09/29/17 0156  09/29/17 2354 09/30/17 1147 09/30/17 2326 10/01/17 1159 10/01/17 2345  NA 117* 121*   < > 116*   < > 118* 120* 120* 121* 122*  K 4.7 4.3   < > 5.0   < > 4.6 4.7 4.8 4.9 4.7  CL 79* 84*   < > 82*   < > 81* 81* 81* 81* 82*  CO2 27 27   < > 25   < > 25 27 25 27 30   GLUCOSE 143* 164*   < > 149*   < > 210* 157* 159* 162* 129*  BUN 9 11   < > 11   < > 14 18 24* 35* 39*  CREATININE 0.55* 0.60*   < > 0.36*   < > 0.74 0.74 0.91 1.19 1.03  CALCIUM 8.8* 8.3*   < > 8.1*   < > 8.6* 8.7* 8.9 8.9 8.6*  MG 1.8 1.6*  --  1.7  --   --   --   --   --   --   PHOS  --  2.8  --  2.6  --   --   --   --   --   --    < > = values in this interval not displayed.   GFR: Estimated Creatinine Clearance: 100.9 mL/min (by C-G formula based on SCr of 1.03 mg/dL). Liver Function Tests: Recent Labs  Lab 09/27/17 2000 09/28/17 0754  AST 133* 103*  ALT 224* 176*  ALKPHOS 156* 145*  BILITOT 12.3* 11.1*  PROT 6.9 6.2*  ALBUMIN 3.1* 2.7*   Recent Labs  Lab 09/27/17 2000  LIPASE 85*   Recent Labs  Lab 09/27/17 2316  AMMONIA 33   Coagulation Profile: Recent Labs  Lab 09/28/17 0005  INR 1.09   Cardiac Enzymes: Recent Labs  Lab 09/27/17 2000 09/28/17 0207  CKTOTAL  --  58  TROPONINI <0.03  --    BNP (last 3 results) No results for input(s): PROBNP in the last 8760 hours. HbA1C: No results for input(s): HGBA1C in the last 72  hours. CBG: Recent Labs  Lab 10/01/17 [phone]  10/01/17 1150 10/01/17 1721 10/01/17 2147 10/02/17 0728  GLUCAP 142* 174* 142* 155* 122*   Lipid Profile: No results for input(s): CHOL, HDL, LDLCALC, TRIG, CHOLHDL, LDLDIRECT in the last 72 hours. Thyroid Function Tests: No results for input(s): TSH, T4TOTAL, FREET4, T3FREE, THYROIDAB in the last 72 hours. Anemia Panel: No results for input(s): VITAMINB12, FOLATE, FERRITIN, TIBC, IRON, RETICCTPCT in the last 72 hours. Urine analysis:    Component Value Date/Time   COLORURINE AMBER (A) 09/28/2017 0030   APPEARANCEUR CLEAR 09/28/2017 0030   LABSPEC 1.017 09/28/2017 0030   PHURINE 5.0 09/28/2017 0030   GLUCOSEU 50 (A) 09/28/2017 0030   HGBUR SMALL (A) 09/28/2017 0030   BILIRUBINUR MODERATE (A) 09/28/2017 0030   KETONESUR NEGATIVE 09/28/2017 0030   PROTEINUR 100 (A) 09/28/2017 0030   NITRITE NEGATIVE 09/28/2017 0030   LEUKOCYTESUR NEGATIVE 09/28/2017 0030   Sepsis Labs: @LABRCNTIP (procalcitonin:4,lacticidven:4) ) Recent Results (from the past 240 hour(s))  MRSA PCR Screening     Status: None   Collection Time: 09/28/17 12:00 AM  Result Value Ref Range Status   MRSA by PCR NEGATIVE NEGATIVE Final    Comment:        The GeneXpert MRSA Assay (FDA approved for NASAL specimens only), is one component of a comprehensive MRSA colonization surveillance program. It is not intended to diagnose MRSA infection nor to guide or monitor treatment for MRSA infections. Performed at [facility], [place] [address], [place], [place] [address]   Urine culture     Status: Abnormal   Collection Time: 09/28/17 12:30 AM  Result Value Ref Range Status   Specimen Description   Final    URINE, CATHETERIZED Performed at [place] [address] [place], [place] [address]    Special Requests   Final    Normal Performed at [facility], [place] [address] [place], [place] [address]     Culture (A)  Final    <10,000 COLONIES/mL INSIGNIFICANT GROWTH Performed at [place] [address], [place], [place] [address]    Report Status 09/29/2017 FINAL  Final         Radiology Studies: US Biopsy (liver)  Result Date: 09/30/2017 INDICATION: Concern for metastatic lung cancer. Please perform ultrasound-guided liver lesion biopsy for tissue diagnostic purposes. EXAM: ULTRASOUND GUIDED LIVER LESION BIOPSY COMPARISON:  CT the chest, abdomen and pelvis - 09/23/2017; CT abdomen pelvis - 09/20/2017 MEDICATIONS: None ANESTHESIA/SEDATION: Fentanyl 50 mcg IV; Versed 1 mg IV Total Moderate Sedation time:  12 Minutes. The patient's level of consciousness and vital signs were monitored continuously by radiology nursing throughout the procedure under my direct supervision. COMPLICATIONS: None immediate. PROCEDURE: Informed written consent was obtained from the patient after a discussion of the risks, benefits and alternatives to treatment. The patient understands and consents the procedure. A timeout was performed prior to the initiation of the procedure. Ultrasound scanning was performed of the right upper abdominal quadrant demonstrates an approximately 3.2 by 2.3 cm mass within the right lobe of the liver (image 25) compatible with ill-defined mass seen on preceding abdominal CT performed 09/20/2017 on image 30, series 2. The procedure was planned. The right upper abdominal quadrant was prepped and draped in the usual sterile fashion. The overlying soft tissues were anesthetized with 1% lidocaine with epinephrine. A 17 gauge, 6.8 cm co-axial needle was advanced into a peripheral aspect of the lesion. This was followed by 5 core biopsies with an 18 gauge core device under direct ultrasound  guidance. The coaxial needle tract was embolized with a small amount of Gel-Foam slurry and superficial hemostasis was obtained with manual compression. Post procedural scanning was negative for definitive  area of hemorrhage or additional complication. A dressing was placed. The patient tolerated the procedure well without immediate post procedural complication. IMPRESSION: Technically successful ultrasound guided core needle biopsy of indeterminate mass within the right lobe of the liver. Electronically Signed   By: Sandi Mariscal M.D.   On: 09/30/2017 17:26      Scheduled Meds: . amLODipine  5 mg Oral QHS  . bisacodyl  10 mg Rectal Daily  . chlorpheniramine-HYDROcodone  5 mL Oral Q12H  . demeclocycline  300 mg Oral Q12H  . dextromethorphan-guaiFENesin  1 tablet Oral BID  . famotidine  10 mg Oral Daily  . furosemide  160 mg Oral BID  . insulin aspart  0-15 Units Subcutaneous TID WC  . insulin aspart  0-5 Units Subcutaneous QHS  . ipratropium-albuterol  3 mL Nebulization TID  . nicotine  21 mg Transdermal Daily  . omega-3 acid ethyl esters  1 g Oral BID  . senna  1 tablet Oral BID   Continuous Infusions:   LOS: 5 days    Time spent in minutes: 35    Debbe Odea, MD [facility] Pager: [email or web address] Password [email or web address] 10/02/2017, 9:04 AM

## 2017-10-02 NOTE — Progress Notes (Signed)
Occupational Therapy Treatment Patient Details Name: Robert Compton MRN: [id number] DOB: [birth year] Today's Date: 10/02/2017    History of present illness 72 y.o. male with medical history significant of hypertension diabetes type 2, right TKR and  new diagnosis of metastatic cancer likely lung with liver mets admitted due to weakness and nausea   OT comments  Pt was orthostatic when standing today; see vitals section of chart.  Pt did raise when he sat up. Tolerated activity better than yesterday, but still limited engagement in ADLs.  Pt hates the chair but was agreeable to sitting EOB.  Follow Up Recommendations  SNF    Equipment Recommendations  3 in 1 bedside commode(wide)    Recommendations for Other Services      Precautions / Restrictions Precautions Precautions: Fall Precaution Comments: monitor vitals:  orthostatic Restrictions Weight Bearing Restrictions: No       Mobility Bed Mobility         Supine to sit: Min guard(extra time) Sit to supine: Mod assist   General bed mobility comments: assist with legs back to bed; pt was orthostatic  Transfers   Equipment used: Rolling walker (2 wheeled)   Sit to Stand: Min assist;+2 safety/equipment         General transfer comment: assist to rise and steady. Pt sidestepped to [place]                                           ADL either performed or assessed with clinical judgement   ADL       Grooming: Set up;Sitting;Wash/dry face(donned/doffed dentures)                                 General ADL Comments: Pt does not like chair:  agreeable to sitting EOB.  Performed grooming activities.  Pt was orthostatic when he stood; see vitals section of chart     Vision       Perception     Praxis      Cognition Arousal/Alertness: Awake/alert Behavior During Therapy: WFL for tasks assessed/performed Overall Cognitive Status: Within Functional Limits for tasks  assessed                                          Exercises     Shoulder Instructions       General Comments Pt was symptomatic when standing (orthostatic).  States not only lightheadedness but also blurry vision and black spots    Pertinent Vitals/ Pain       Pain Assessment: No/denies pain  Home Living                                          Prior Functioning/Environment              Frequency  Min 2X/week        Progress Toward Goals  OT Goals(current goals can now be found in the care plan section)  Progress towards OT goals: Progressing toward goals     Plan Discharge plan remains appropriate    Co-evaluation    PT/OT/SLP Co-Evaluation/Treatment:  Yes Reason for Co-Treatment: For patient/therapist safety PT goals addressed during session: Mobility/safety with mobility OT goals addressed during session: ADL's and self-care      AM-PAC PT "6 Clicks" Daily Activity     Outcome Measure   Help from another person eating meals?: None Help from another person taking care of personal grooming?: A Little Help from another person toileting, which includes using toliet, bedpan, or urinal?: A Lot Help from another person bathing (including washing, rinsing, drying)?: A Lot Help from another person to put on and taking off regular upper body clothing?: A Little Help from another person to put on and taking off regular lower body clothing?: Total 6 Click Score: 15    End of Session Equipment Utilized During Treatment: Rolling walker  OT Visit Diagnosis: Unsteadiness on feet (R26.81);Other abnormalities of gait and mobility (R26.89);Muscle weakness (generalized) (M62.81)   Activity Tolerance (orthostatic hypotension)   Patient Left in bed;with call bell/phone within reach;with bed alarm set   Nurse Communication (BP)        Time: 1100-1134 OT Time Calculation (min): 34 min  Charges: OT General Charges $OT Visit: 1  Visit OT Treatments $Therapeutic Activity: 8-22 mins  Lesle Chris, OTR/L [phone] 10/02/2017   Robert Compton 10/02/2017, 12:38 PM

## 2017-10-02 NOTE — Progress Notes (Signed)
Physical Therapy Treatment Patient Details Name: Robert Compton MRN: [id number] DOB: 03-21-1946 Today's Date: 10/02/2017    History of Present Illness 72 y.o. male with medical history significant of hypertension diabetes type 2, right TKR and  new diagnosis of metastatic cancer likely lung with liver mets admitted due to weakness and nausea    PT Comments    Pt cooperative but continues ltd by fatigue and orthostatic with standing.   Follow Up Recommendations  SNF     Equipment Recommendations       Recommendations for Other Services       Precautions / Restrictions Precautions Precautions: Fall Precaution Comments: monitor vitals:  orthostatic Restrictions Weight Bearing Restrictions: No    Mobility  Bed Mobility Overal bed mobility: Needs Assistance Bed Mobility: Supine to Sit;Sit to Supine     Supine to sit: Min guard Sit to supine: Mod assist   General bed mobility comments: assist with legs back to bed; pt was orthostatic  Transfers Overall transfer level: Needs assistance Equipment used: Rolling walker (2 wheeled) Transfers: Sit to/from [facility] Sit to Stand: Min assist;+2 safety/equipment         General transfer comment: assist to rise and steady. Pt sidestepped to [facility]  Ambulation/Gait Ambulation/Gait assistance: Min assist;+2 physical assistance;+2 safety/equipment Gait Distance (Feet): 4 Feet Assistive device: Rolling walker (2 wheeled) Gait Pattern/deviations: Step-to pattern;Decreased step length - right;Decreased step length - left;Shuffle Gait velocity: decr   General Gait Details: Pt side-stepped up side of bed only - ltd by fatigue and c/o dizziness with standing 2* falling BP   Stairs             Wheelchair Mobility    Modified Rankin (Stroke Patients Only)       Balance Overall balance assessment: Needs assistance Sitting-balance support: Bilateral upper extremity supported Sitting balance-Leahy Scale:  Good     Standing balance support: Bilateral upper extremity supported Standing balance-Leahy Scale: Poor Standing balance comment: reliant on RW                            Cognition Arousal/Alertness: Awake/alert Behavior During Therapy: WFL for tasks assessed/performed Overall Cognitive Status: Within Functional Limits for tasks assessed                                 General Comments: not oriented to date/place but knew he was in a hospital      Exercises      General Comments General comments (skin integrity, edema, etc.): Pt symptomatic with standing (orthostatic).      Pertinent Vitals/Pain Pain Assessment: No/denies pain    Home Living                      Prior Function            PT Goals (current goals can now be found in the care plan section) Acute Rehab PT Goals Patient Stated Goal: rest more PT Goal Formulation: With patient Time For Goal Achievement: 10/13/17 Potential to Achieve Goals: Good Progress towards PT goals: Progressing toward goals    Frequency    Min 3X/week      PT Plan Current plan remains appropriate    Co-evaluation   Reason for Co-Treatment: For patient/therapist safety PT goals addressed during session: Mobility/safety with mobility OT goals addressed during session: ADL's and self-care  AM-PAC PT "6 Clicks" Daily Activity  Outcome Measure  Difficulty turning over in bed (including adjusting bedclothes, sheets and blankets)?: A Lot Difficulty moving from lying on back to sitting on the side of the bed? : A Lot Difficulty sitting down on and standing up from a chair with arms (e.g., wheelchair, bedside commode, etc,.)?: Unable Help needed moving to and from a bed to chair (including a wheelchair)?: A Little Help needed walking in hospital room?: A Little Help needed climbing 3-5 steps with a railing? : A Lot 6 Click Score: 13    End of Session Equipment Utilized During  Treatment: Gait belt Activity Tolerance: Patient limited by fatigue;Other (comment)(orthostatic) Patient left: in bed;with call bell/phone within reach;with nursing/sitter in room Nurse Communication: Mobility status;Other (comment)(orthostatic) PT Visit Diagnosis: Difficulty in walking, not elsewhere classified (R26.2);Muscle weakness (generalized) (M62.81)     Time: 6578-4696 PT Time Calculation (min) (ACUTE ONLY): 34 min  Charges:  $Therapeutic Activity: 8-22 mins                    G Codes:       Pg [phone]    Anselma Herbel 10/02/2017, 1:19 PM

## 2017-10-02 NOTE — Progress Notes (Signed)
[facility] ROUNDING NOTE   Subjective:   This is a very unfortunate situation of a 72 year old smoker with history of diabetes and hypertension that was admitted with apical lung mass and also lymphadenopathy and possible liver metastasis. He was admitted with a strong suspicion of SIADH and is being managed with fluid restriction and demeclocycline and lasix    He has a sodium of 122 today . Urine output looks excellent on lasix   Objective:  Vital signs in last 24 hours:  Temp:  [97.5 F (36.4 C)-99.8 F (37.7 C)] 98.3 F (36.8 C) (06/19 0743) Pulse Rate:  [89-96] 92 (06/19 0600) Resp:  [15-22] 16 (06/19 0600) BP: (69-128)/(44-65) 105/48 (06/19 0600) SpO2:  [91 %-96 %] 91 % (06/19 0600)  Weight change:  Filed Weights   09/27/17 1844  Weight: (!) 310 lb (140.6 kg)    Intake/Output: I/O last 3 completed shifts: In: 1182 [P.O.:1182] Out: 2825 [Urine:2825]   Intake/Output this shift:  Total I/O In: 120 [P.O.:120] Out: -   CVS- RRR RS- CTA ABD- BS present soft non-distended  Normal bowel sound  EXT- no edema   Basic Metabolic Panel: Recent Labs  Lab 09/27/17 2000 09/28/17 0207  09/29/17 0156  09/29/17 2354 09/30/17 1147 09/30/17 2326 10/01/17 1159 10/01/17 2345  NA 117* 121*   < > 116*   < > 118* 120* 120* 121* 122*  K 4.7 4.3   < > 5.0   < > 4.6 4.7 4.8 4.9 4.7  CL 79* 84*   < > 82*   < > 81* 81* 81* 81* 82*  CO2 27 27   < > 25   < > 25 27 25 27 30   GLUCOSE 143* 164*   < > 149*   < > 210* 157* 159* 162* 129*  BUN 9 11   < > 11   < > 14 18 24* 35* 39*  CREATININE 0.55* 0.60*   < > 0.36*   < > 0.74 0.74 0.91 1.19 1.03  CALCIUM 8.8* 8.3*   < > 8.1*   < > 8.6* 8.7* 8.9 8.9 8.6*  MG 1.8 1.6*  --  1.7  --   --   --   --   --   --   PHOS  --  2.8  --  2.6  --   --   --   --   --   --    < > = values in this interval not displayed.    Liver Function Tests: Recent Labs  Lab 09/27/17 2000 09/28/17 0754  AST 133* 103*  ALT 224* 176*  ALKPHOS  156* 145*  BILITOT 12.3* 11.1*  PROT 6.9 6.2*  ALBUMIN 3.1* 2.7*   Recent Labs  Lab 09/27/17 2000  LIPASE 85*   Recent Labs  Lab 09/27/17 2316  AMMONIA 33    CBC: Recent Labs  Lab 09/27/17 2000 09/28/17 0207  WBC 10.1 9.5  NEUTROABS 8.1*  --   HGB 16.5 14.7  HCT 44.8 41.6  MCV 87.5 88.1  PLT 134* 127*    Cardiac Enzymes: Recent Labs  Lab 09/27/17 2000 09/28/17 0207  CKTOTAL  --  98  TROPONINI <0.03  --     BNP: Invalid input(s): POCBNP  CBG: Recent Labs  Lab 10/01/17 0742 10/01/17 1150 10/01/17 1721 10/01/17 2147 10/02/17 0728  GLUCAP 142* 174* 142* 155* 122*    Microbiology: Results for orders placed or performed during the hospital encounter  of 09/27/17  MRSA PCR Screening     Status: None   Collection Time: 09/28/17 12:00 AM  Result Value Ref Range Status   MRSA by PCR NEGATIVE NEGATIVE Final    Comment:        The GeneXpert MRSA Assay (FDA approved for NASAL specimens only), is one component of a comprehensive MRSA colonization surveillance program. It is not intended to diagnose MRSA infection nor to guide or monitor treatment for MRSA infections. Performed at [facility], [place] [address] [place], [place] [address]   Urine culture     Status: Abnormal   Collection Time: 09/28/17 12:30 AM  Result Value Ref Range Status   Specimen Description   Final    URINE, CATHETERIZED Performed at [place] [address] [place], [place] [address]    Special Requests   Final    Normal Performed at [facility], [place] [address] [place], [place] [address]    Culture (A)  Final    <10,000 COLONIES/mL INSIGNIFICANT GROWTH Performed at [place] [address], [place],  [address]    Report Status 09/29/2017 FINAL  Final    Coagulation Studies: No results for input(s): LABPROT, INR in the last 72 hours.  Urinalysis: No results for input(s): COLORURINE,  LABSPEC, PHURINE, GLUCOSEU, HGBUR, BILIRUBINUR, KETONESUR, PROTEINUR, UROBILINOGEN, NITRITE, LEUKOCYTESUR in the last 72 hours.  Invalid input(s): APPERANCEUR    Imaging: US Biopsy (liver)  Result Date: 09/30/2017 INDICATION: Concern for metastatic lung cancer. Please perform ultrasound-guided liver lesion biopsy for tissue diagnostic purposes. EXAM: ULTRASOUND GUIDED LIVER LESION BIOPSY COMPARISON:  CT the chest, abdomen and pelvis - 09/23/2017; CT abdomen pelvis - 09/20/2017 MEDICATIONS: None ANESTHESIA/SEDATION: Fentanyl 50 mcg IV; Versed 1 mg IV Total Moderate Sedation time:  12 Minutes. The patient's level of consciousness and vital signs were monitored continuously by radiology nursing throughout the procedure under my direct supervision. COMPLICATIONS: None immediate. PROCEDURE: Informed written consent was obtained from the patient after a discussion of the risks, benefits and alternatives to treatment. The patient understands and consents the procedure. A timeout was performed prior to the initiation of the procedure. Ultrasound scanning was performed of the right upper abdominal quadrant demonstrates an approximately 3.2 by 2.3 cm mass within the right lobe of the liver (image 25) compatible with ill-defined mass seen on preceding abdominal CT performed 09/20/2017 on image 30, series 2. The procedure was planned. The right upper abdominal quadrant was prepped and draped in the usual sterile fashion. The overlying soft tissues were anesthetized with 1% lidocaine with epinephrine. A 17 gauge, 6.8 cm co-axial needle was advanced into a peripheral aspect of the lesion. This was followed by 5 core biopsies with an 18 gauge core device under direct ultrasound guidance. The coaxial needle tract was embolized with a small amount of Gel-Foam slurry and superficial hemostasis was obtained with manual compression. Post procedural scanning was negative for definitive area of hemorrhage or additional  complication. A dressing was placed. The patient tolerated the procedure well without immediate post procedural complication. IMPRESSION: Technically successful ultrasound guided core needle biopsy of indeterminate mass within the right lobe of the liver. Electronically Signed   By: Sandi Mariscal M.D.   On: 09/30/2017 17:26     Medications:    . amLODipine  5 mg Oral QHS  . bisacodyl  10 mg Rectal Daily  . chlorpheniramine-HYDROcodone  5 mL Oral Q12H  . demeclocycline  300 mg Oral Q12H  .  dextromethorphan-guaiFENesin  1 tablet Oral BID  . famotidine  10 mg Oral Daily  . furosemide  160 mg Oral BID  . insulin aspart  0-15 Units Subcutaneous TID WC  . insulin aspart  0-5 Units Subcutaneous QHS  . ipratropium-albuterol  3 mL Nebulization TID  . nicotine  21 mg Transdermal Daily  . omega-3 acid ethyl esters  1 g Oral BID  . senna  1 tablet Oral BID   albuterol, bisacodyl, ondansetron **OR** ondansetron (ZOFRAN) IV, polyethylene glycol, traMADol  Assessment/ Plan:   Hyponatremia  This appears to be consistent with SIADH   Noted normal TSH  With urine osm 532 and urine sodium of 42  He has normal renal function   His sodium really has not changed much in the last 24 hours and would continue the current coarse today   Tolvaptan is not being recommended due to the increase in LFT. There is use of topical diclofenac and as this is an nsaid  I would probably stop this although this is topical and not sure how much systemic absorption is occurring   Patient's sodium is increasing slowly  Continue current plan of lasix and demeclocyline    Metastatic cancer to liver  With right apical lung mass  Known smoker with biopsy of liver performed 6/17  Awaiting the results   Diabetes  Patients outpatient metformin is being held at this point      LOS: 5 Aimie Wagman W @TODAY @11 :06 AM

## 2017-10-03 DIAGNOSIS — C787 Secondary malignant neoplasm of liver and intrahepatic bile duct: Secondary | ICD-10-CM

## 2017-10-03 DIAGNOSIS — C3491 Malignant neoplasm of unspecified part of right bronchus or lung: Secondary | ICD-10-CM

## 2017-10-03 DIAGNOSIS — C3490 Malignant neoplasm of unspecified part of unspecified bronchus or lung: Secondary | ICD-10-CM

## 2017-10-03 DIAGNOSIS — E876 Hypokalemia: Secondary | ICD-10-CM

## 2017-10-03 LAB — BASIC METABOLIC PANEL
Anion gap: 11 (ref 5–15)
Anion gap: 12 (ref 5–15)
Anion gap: 13 (ref 5–15)
BUN: 48 mg/dL — AB (ref 6–20)
BUN: 48 mg/dL — AB (ref 6–20)
BUN: 52 mg/dL — ABNORMAL HIGH (ref 6–20)
CHLORIDE: 82 mmol/L — AB (ref 101–111)
CHLORIDE: 83 mmol/L — AB (ref 101–111)
CO2: 30 mmol/L (ref 22–32)
CO2: 30 mmol/L (ref 22–32)
CO2: 30 mmol/L (ref 22–32)
Calcium: 8.6 mg/dL — ABNORMAL LOW (ref 8.9–10.3)
Calcium: 8.8 mg/dL — ABNORMAL LOW (ref 8.9–10.3)
Calcium: 8.9 mg/dL (ref 8.9–10.3)
Chloride: 83 mmol/L — ABNORMAL LOW (ref 101–111)
Creatinine, Ser: 1.06 mg/dL (ref 0.61–1.24)
Creatinine, Ser: 1.12 mg/dL (ref 0.61–1.24)
Creatinine, Ser: 1.13 mg/dL (ref 0.61–1.24)
GFR calc Af Amer: >60 mL/min (ref >60)
GFR calc Af Amer: >60 mL/min (ref >60)
GFR calc non Af Amer: >60 mL/min (ref >60)
GFR calc non Af Amer: >60 mL/min (ref >60)
GLUCOSE: 176 mg/dL — AB (ref 65–99)
Glucose, Bld: 159 mg/dL — ABNORMAL HIGH (ref 65–99)
Glucose, Bld: 186 mg/dL — ABNORMAL HIGH (ref 65–99)
POTASSIUM: 5.2 mmol/L — AB (ref 3.5–5.1)
Potassium: 4.6 mmol/L (ref 3.5–5.1)
Potassium: 4.7 mmol/L (ref 3.5–5.1)
Sodium: 124 mmol/L — ABNORMAL LOW (ref 135–145)
Sodium: 124 mmol/L — ABNORMAL LOW (ref 135–145)
Sodium: 126 mmol/L — ABNORMAL LOW (ref 135–145)

## 2017-10-03 LAB — GLUCOSE, CAPILLARY
GLUCOSE-CAPILLARY: 188 mg/dL — AB (ref 65–99)
Glucose-Capillary: 165 mg/dL — ABNORMAL HIGH (ref 65–99)
Glucose-Capillary: 183 mg/dL — ABNORMAL HIGH (ref 65–99)
Glucose-Capillary: 210 mg/dL — ABNORMAL HIGH (ref 65–99)

## 2017-10-03 MED ORDER — ORAL CARE MOUTH RINSE
15.0000 mL | Freq: Two times a day (BID) | OROMUCOSAL | Status: DC
  Administered 2017-10-03 – 2017-10-08 (×6): 15 mL via OROMUCOSAL

## 2017-10-03 MED ORDER — HYDROCODONE-ACETAMINOPHEN 7.5-325 MG/15ML PO SOLN
10.0000 mL | Freq: Two times a day (BID) | ORAL | Status: DC
  Administered 2017-10-03 – 2017-10-08 (×10): 10 mL via ORAL
  Filled 2017-10-03 (×11): qty 15

## 2017-10-03 NOTE — Progress Notes (Signed)
[facility] ROUNDING NOTE   Subjective:   This is a very unfortunate situation of a 72 year old smoker with history of diabetes and hypertension that was admitted with apical lung mass and also lymphadenopathy and possible liver metastasis. He was admitted with a strong suspicion of SIADH and is being managed with fluid restriction and demeclocycline and lasix    He has a sodium of 124  today . Urine output looks excellent on lasix  And continues to do well   Awaiting biopsy results   Objective:  Vital signs in last 24 hours:  Temp:  [97.4 F (36.3 C)-98.5 F (36.9 C)] 97.5 F (36.4 C) (06/20 0800) Pulse Rate:  [88-104] 90 (06/20 0816) Resp:  [17-25] 17 (06/20 0816) BP: (101-128)/(44-56) 101/44 (06/20 0816) SpO2:  [90 %-96 %] 93 % (06/20 0816)  Weight change:  Filed Weights   09/27/17 1844  Weight: (!) 310 lb (140.6 kg)    Intake/Output: I/O last 3 completed shifts: In: 660 [P.O.:660] Out: 2025 [Urine:2025]   Intake/Output this shift:  No intake/output data recorded.  CVS- RRR RS- CTA ABD- BS present soft non-distended  Normal bowel sound  EXT- no edema   Basic Metabolic Panel: Recent Labs  Lab 09/27/17 2000 09/28/17 0207  09/29/17 0156  10/01/17 1159 10/01/17 2345 10/02/17 1144 10/02/17 2339 10/03/17 0324  NA 117* 121*   < > 116*   < > 121* 122* 123* 124* 124*  K 4.7 4.3   < > 5.0   < > 4.9 4.7 4.9 5.2* 4.6  CL 79* 84*   < > 82*   < > 81* 82* 82* 82* 83*  CO2 27 27   < > 25   < > 27 30 28 30 30   GLUCOSE 143* 164*   < > 149*   < > 162* 129* 177* 176* 159*  BUN 9 11   < > 11   < > 35* 39* 44* 48* 48*  CREATININE 0.55* 0.60*   < > 0.36*   < > 1.19 1.03 1.30* 1.06 1.12  CALCIUM 8.8* 8.3*   < > 8.1*   < > 8.9 8.6* 8.8* 8.8* 8.6*  MG 1.8 1.6*  --  1.7  --   --   --   --   --   --   PHOS  --  2.8  --  2.6  --   --   --   --   --   --    < > = values in this interval not displayed.    Liver Function Tests: Recent Labs  Lab 09/27/17 2000  09/28/17 0754  AST 133* 103*  ALT 224* 176*  ALKPHOS 156* 145*  BILITOT 12.3* 11.1*  PROT 6.9 6.2*  ALBUMIN 3.1* 2.7*   Recent Labs  Lab 09/27/17 2000  LIPASE 85*   Recent Labs  Lab 09/27/17 2316  AMMONIA 33    CBC: Recent Labs  Lab 09/27/17 2000 09/28/17 0207  WBC 10.1 9.5  NEUTROABS 8.1*  --   HGB 16.5 14.7  HCT 44.8 41.6  MCV 87.5 88.1  PLT 134* 127*    Cardiac Enzymes: Recent Labs  Lab 09/27/17 2000 09/28/17 0207  CKTOTAL  --  98  TROPONINI <0.03  --     BNP: Invalid input(s): POCBNP  CBG: Recent Labs  Lab 10/02/17 1129 10/02/17 1619 10/02/17 2139 10/03/17 0743 10/03/17 1125  GLUCAP 196* 156* 192* 165* 183*    Microbiology: Results for  orders placed or performed during the hospital encounter of 09/27/17  MRSA PCR Screening     Status: None   Collection Time: 09/28/17 12:00 AM  Result Value Ref Range Status   MRSA by PCR NEGATIVE NEGATIVE Final    Comment:        The GeneXpert MRSA Assay (FDA approved for NASAL specimens only), is one component of a comprehensive MRSA colonization surveillance program. It is not intended to diagnose MRSA infection nor to guide or monitor treatment for MRSA infections. Performed at [facility], [place] [address], [place], [place] [address]   Urine culture     Status: Abnormal   Collection Time: 09/28/17 12:30 AM  Result Value Ref Range Status   Specimen Description   Final    URINE, CATHETERIZED Performed at [place] [address] [place], [place] [address]    Special Requests   Final    Normal Performed at [facility], [place] [address], [place], [place] [address]    Culture (A)  Final    <10,000 COLONIES/mL INSIGNIFICANT GROWTH Performed at [place] [address] [place], [place] [address]    Report Status 09/29/2017 FINAL  Final    Coagulation Studies: No results for input(s): LABPROT, INR in the last 72  hours.  Urinalysis: No results for input(s): COLORURINE, LABSPEC, PHURINE, GLUCOSEU, HGBUR, BILIRUBINUR, KETONESUR, PROTEINUR, UROBILINOGEN, NITRITE, LEUKOCYTESUR in the last 72 hours.  Invalid input(s): APPERANCEUR    Imaging: No results found.   Medications:    . demeclocycline  300 mg Oral Q12H  . dextromethorphan-guaiFENesin  1 tablet Oral BID  . famotidine  10 mg Oral Daily  . furosemide  160 mg Oral BID  . HYDROcodone-acetaminophen  10 mL Oral BID  . insulin aspart  0-15 Units Subcutaneous TID WC  . insulin aspart  0-5 Units Subcutaneous QHS  . ipratropium-albuterol  3 mL Nebulization TID  . mouth rinse  15 mL Mouth Rinse BID  . nicotine  21 mg Transdermal Daily  . omega-3 acid ethyl esters  1 g Oral BID  . senna  1 tablet Oral BID   albuterol, bisacodyl, ondansetron **OR** ondansetron (ZOFRAN) IV, polyethylene glycol, traMADol  Assessment/ Plan:   Hyponatremia  This appears to be consistent with SIADH   Noted normal TSH  With urine osm 532 and urine sodium of 42  He has normal renal function   His sodium really has not changed much in the last 24 hours and would continue the current coarse today   Tolvaptan is not being recommended due to the increase in LFT. Stopped diclofenac  He is to be more aggressively fluid restricted  Sodium has improved 124   Metastatic cancer to liver  With right apical lung mass  Known smoker with biopsy of liver performed 6/17  Awaiting the results   Diabetes  Patients outpatient metformin is being held at this point      LOS: 6 Leila Schuff W @TODAY @11 :31 AM

## 2017-10-03 NOTE — Clinical Social Work Note (Addendum)
Clinical Social Work Assessment  Patient Details  Name: Robert Compton MRN: [id number] Date of Birth: 09-15-1945  Date of referral:  10/03/17               Reason for consult:  Facility Placement                Permission sought to share information with:  Facility Sport and exercise psychologist, Family Supports Permission granted to share information::  Yes, Verbal Permission Granted  Name::        Agency::  SNF  Relationship::   Daughter [name]   Contact Information:      Housing/Transportation Living arrangements for the past 2 months:  Single Family Home Source of Information:  Patient Patient Interpreter Needed:  None Criminal Activity/Legal Involvement Pertinent to Current Situation/Hospitalization:  No - Comment as needed Significant Relationships:  None Lives with:  Facility Resident Do you feel safe going back to the place where you live?  Yes Need for family participation in patient care:  Yes   Care giving concerns:    medical history significant of hypertension diabetes type 2, right TKR and  new diagnosis of metastatic cancer likely lung with liver mets admitted due to weakness and nausea. PT recommends post acute care-rehab at discharge.   Social Worker assessment / plan: CSW met with patient at bedside, explained role and reason for visit-to start discussing disposition plan. Patient sister and brother law were present during discussion.  Patients he is agreeable to SNF for rehab. He reports he is quite weak and has only been able to walk short distances. Patient reports prior to this hospitalization he was independent, working full time,  and still driving. Patient reports "this is the first time I have been this sick." Patient reports this is the first time he will have to go rehab. CSW explain SNF and insurance authorization process.  CSW explain bed availability at each SNF changes daily. Patient reports understanding. CSW will follow up with bed offers.   FL2/ PASRR complete.    Plan: SNF placement when medically stable. Continued Medical work up at this time/patient may need other treatments while at [facility].   Employment status:  Engineer, mining information:  Nurse, mental health) PT Recommendations:  [place] / Referral to community resources:  [place]  Patient/Family's Response to care:  Agreeable to Care.   Patient/Family's Understanding of and Emotional Response to Diagnosis, Current Treatment, and Prognosis: Patient presented with flat affect and reports learning about his diagnosis and is agreeable to current treatment plan. Patient reports having support from family and friends.  Emotional Assessment Appearance:  Appears stated age Attitude/Demeanor/Rapport:    Affect (typically observed):  Calm, Accepting Orientation:  Oriented to Self, Oriented to Place, Oriented to  Time, Oriented to Situation Alcohol / Substance use:  Not Applicable Psych involvement (Current and /or in the community):  No (Comment)  Discharge Needs  Concerns to be addressed:  Discharge Planning Concerns Readmission within the last 30 days:  No Current discharge risk:  Dependent with Mobility Barriers to Discharge:  Continued Medical Work up, [place], LCSW 10/03/2017, 1:47 PM

## 2017-10-03 NOTE — NC FL2 (Signed)
[place] LEVEL OF CARE SCREENING TOOL     IDENTIFICATION  Patient Name: Robert Compton Birthdate: [birth year] Sex: male Admission Date (Current Location): 09/27/2017  [facility] and [place] Number:  Herbalist and Address:  [facility],  [address] [place], [place]      Provider Number: [id number]  Attending Physician Name and Address:  Lavina Hamman, MD  Relative Name and Phone Number:       Current Level of Care: Hospital Recommended Level of Care: [place] Prior Approval Number:    Date Approved/Denied:   PASRR Number: [id number]  Discharge Plan: SNF    Current Diagnoses: Patient Active Problem List   Diagnosis Date Noted  . Liver masses   . Mass of upper lobe of right lung 09/28/2017  . Hyponatremia 09/27/2017  . Failure to thrive in adult 09/27/2017  . Metastatic cancer to liver ([place]) 09/27/2017  . DM (diabetes mellitus), type 2 ([place]) 09/27/2017  . Essential hypertension 09/27/2017    Orientation RESPIRATION BLADDER Height & Weight     Self, Time, Situation, Place  O2 Continent Weight: (!) 310 lb (140.6 kg) Height:  6\' 3"  (190.5 cm)  BEHAVIORAL SYMPTOMS/MOOD NEUROLOGICAL BOWEL NUTRITION STATUS      Continent Diet(see dc summary)  AMBULATORY STATUS COMMUNICATION OF NEEDS Skin   Limited Assist Verbally Other (Comment)(Incision(Closed)AbdomenRight Adhesive Bandage Changed PRN)                       Personal Care Assistance Level of Assistance  Bathing, Feeding, Dressing Bathing Assistance: Maximum assistance Feeding assistance: Independent Dressing Assistance: Maximum assistance     Functional Limitations Info  Sight, Hearing, Speech Sight Info: Adequate Hearing Info: Adequate Speech Info: Adequate    SPECIAL CARE FACTORS FREQUENCY  PT (By licensed PT), OT (By licensed OT)     PT Frequency: 5x/week OT Frequency: 5x/week            Contractures      Additional Factors  Info  Code Status, Allergies, Insulin Sliding Scale Code Status Info: Full Code Allergies Info: Amoxicillin-pot Clavulanate;Nitrofurantoin   Insulin Sliding Scale Info: see dc summary       Current Medications (10/03/2017):  This is the current hospital active medication list Current Facility-Administered Medications  Medication Dose Route Frequency Provider Last Rate Last Dose  . albuterol (PROVENTIL) (2.5 MG/3ML) 0.083% nebulizer solution 2.5 mg  2.5 mg Nebulization Q2H PRN Doutova, Anastassia, MD   2.5 mg at 09/29/17 0411  . bisacodyl (DULCOLAX) suppository 10 mg  10 mg Rectal Daily PRN [name], MD      . demeclocycline (DECLOMYCIN) tablet 300 mg  300 mg Oral Q12H Mauricia Area, MD   300 mg at 10/03/17 1012  . dextromethorphan-guaiFENesin (MUCINEX DM) 30-600 MG per 12 hr tablet 1 tablet  1 tablet Oral BID Debbe Odea, MD   1 tablet at 10/03/17 1014  . famotidine (PEPCID) tablet 10 mg  10 mg Oral Daily Doutova, Anastassia, MD   10 mg at 10/03/17 1013  . furosemide (LASIX) tablet 160 mg  160 mg Oral BID Mauricia Area, MD   160 mg at 10/03/17 0810  . HYDROcodone-acetaminophen (HYCET) 7.5-325 mg/15 ml solution 10 mL  10 mL Oral BID Lavina Hamman, MD      . insulin aspart (novoLOG) injection 0-15 Units  0-15 Units Subcutaneous TID WC Debbe Odea, MD   3 Units at 10/03/17 1135  . insulin aspart (novoLOG) injection  0-5 Units  0-5 Units Subcutaneous QHS Rizwan, Saima, MD      . ipratropium-albuterol (DUONEB) 0.5-2.5 (3) MG/3ML nebulizer solution 3 mL  3 mL Nebulization TID Debbe Odea, MD   3 mL at 10/02/17 2152  . MEDLINE mouth rinse  15 mL Mouth Rinse BID Lavina Hamman, MD      . nicotine (NICODERM CQ - dosed in mg/24 hours) patch 21 mg  21 mg Transdermal Daily Debbe Odea, MD   21 mg at 10/03/17 1010  . omega-3 acid ethyl esters (LOVAZA) capsule 1 g  1 g Oral BID Debbe Odea, MD   1 g at 10/03/17 1014  . ondansetron (ZOFRAN) tablet 4 mg  4 mg Oral Q6H PRN [name], MD       Or  . ondansetron (ZOFRAN) injection 4 mg  4 mg Intravenous Q6H PRN [name], MD   4 mg at 09/30/17 2011  . polyethylene glycol (MIRALAX / GLYCOLAX) packet 17 g  17 g Oral Daily PRN [name], MD   17 g at 10/02/17 2132  . senna (SENOKOT) tablet 8.6 mg  1 tablet Oral BID Doutova, Anastassia, MD   8.6 mg at 10/03/17 1014  . traMADol (ULTRAM) tablet 50 mg  50 mg Oral Q6H PRN Bodenheimer, Charles A, NP   50 mg at 09/29/17 1545     Discharge Medications: Please see discharge summary for a list of discharge medications.  Relevant Imaging Results:  Relevant Lab Results:   Additional Information SSN [id number]  Burnis Medin, LCSW

## 2017-10-03 NOTE — Progress Notes (Signed)
Subjective: The patient is seen and examined today.  Several family members were at the bedside.  This included his daughter, son, Joslyn Hy and son girlfriend.  The patient is feeling fine today except for increasing fatigue and weakness as well as yellowish discoloration of his skin and sclera.  He also has abdominal pain more on the right upper quadrant.  He underwent ultrasound-guided core biopsy of 1 of the metastatic liver lesion and the final pathology was reported today to be consistent with a small cell lung cancer.  He denied having any current fever or chills.  He has no nausea, vomiting, diarrhea or constipation.  He denied having any chest pain but continues to have shortness of breath and cough with no hemoptysis.  Objective: Vital signs in last 24 hours: Temp:  [97.5 F (36.4 C)-98.5 F (36.9 C)] 97.5 F (36.4 C) (06/20 1600) Pulse Rate:  [86-104] 99 (06/20 1700) Resp:  [17-25] 25 (06/20 1700) BP: (101-137)/(44-64) 101/48 (06/20 1600) SpO2:  [88 %-96 %] 91 % (06/20 1700)  Intake/Output from previous day: 06/19 0701 - 06/20 0700 In: 440 [P.O.:440] Out: 1625 [Urine:1625] Intake/Output this shift: Total I/O In: 600 [P.O.:600] Out: 900 [Urine:900]  General appearance: alert, cooperative, fatigued and icteric Resp: rales bilaterally and wheezes bilaterally Cardio: regular rate and rhythm, S1, S2 normal, no murmur, click, rub or gallop GI: Tenderness in the right upper quadrant of the abdomen Extremities: extremities normal, atraumatic, no cyanosis or edema  Lab Results:  No results for input(s): WBC, HGB, HCT, PLT in the last 72 hours. BMET Recent Labs    10/03/17 0324 10/03/17 1602  NA 124* 126*  K 4.6 4.7  CL 83* 83*  CO2 30 30  GLUCOSE 159* 186*  BUN 48* 52*  CREATININE 1.12 1.13  CALCIUM 8.6* 8.9    Studies/Results: No results found.  Medications: I have reviewed the patient's current medications.   Assessment/Plan: This is a very pleasant 72 years old  white male recently diagnosed with extensive stage small cell lung cancer presented with large conglomeration of mediastinal lymphadenopathy in the right paratracheal area as well as irregular mass within the right lung apex and multiple metastatic liver lesions diagnosed in June 2019. I had a lengthy discussion with the patient and his family about his current disease age, prognosis and treatment options.  I explained to the patient that he has incurable condition and all the treatment will be of palliative nature.  I discussed with the patient his treatment options including palliative care and hospice referral versus consideration of palliative systemic chemotherapy initially with a reduced dose carboplatin for AUC of 3-4 on day 1 and etoposide 75 mg/M2 on days 1, 2 and 3 every 3 weeks.  The patient is interested in proceeding with systemic chemotherapy as soon as possible. Unfortunately his condition is deteriorating rapidly and his serum bilirubin is probably higher than on the time of admission. I will arrange for the patient to start the first dose of his treatment tomorrow if possible. I may reduce his dose of etoposide further down to 50% of the recommended dose because of the liver dysfunction based on the comprehensive metabolic panel that will be performed tomorrow. For the hypokalemia, he will continue with the fluid restriction and the treatment as recommended by nephrology.  His condition is likely secondary to SIADH from his diagnosis of the lung cancer. The patient understand that he has very poor prognosis with the current stage of his disease and the other comorbidities but  he would like to proceed with the treatment as soon as possible. Thank you so much for taken good care of [name], I will continue to follow-up the patient with you on assist in his management during his hospitalization.   LOS: 6 days    Eilleen Kempf 10/03/2017

## 2017-10-03 NOTE — Progress Notes (Signed)
[facility] Progress Note  Patient: Robert Compton NLZ:[id number]   PCP: [name], MD DOB: 02-13-1946   DOA: 09/27/2017   DOS: 10/03/2017   Date of Service: the patient was seen and examined on 10/03/2017  Subjective: Feeling fatigued and tired, also short of breath.  No chest pain.  No abdominal pain.  No nausea no vomiting.  Brief hospital course: Pt. with PMH of 2 DM, right TKR; admitted on 09/27/2017, presented with complaint of shortness of breath, was found to have natremia secondary to SIADH secondary to newly diagnosed metastatic small cell cancer with liver metastasis. Currently further plan is continue current management.  Assessment and Plan: 1.  Hyponatremia. Secondary to SIADH. Metastatic small cell lung carcinoma with liver mets. Sodium 124,. Patient was started on demeclocycline and Lasix, continue. Appreciate nephrology assistance. Symptom neurologically patient still feels fatigued and tired. We will discontinue Norvasc, also changing Tussionex to Hycet. Continue to monitor at present stepdown.  2.  Metastatic small cell lung carcinoma. Liver metastasis. LFTs are stable. Oncology consulted. Patient will follow-up with [name] as an outpatient, chemotherapy will be started as an outpatient.  3.  9 cough and wheezing. Possible COPD. Former smoker. Hematemesis is resolved, cough is improved.  Continue nicotine patch. Continue nebulizers as needed. Continue Mucinex.  4.  80s mellitus. Abdomen A1c 7.2. Holding metformin. Continue insulin sliding scale.  5.  GERD On stool softener.  Monitor.  6.  Essential hypertension. Blood pressure is currently relatively on the softer side, discontinue all antihypertensive medication of the Lasix.  Diet: Carb modified, fluid restricted to 1.5 L. DVT Prophylaxis: subcutaneous Heparin  Advance goals of care discussion: full code  Family Communication: no family was present at bedside, at the time of  interview.  Disposition:  Discharge to SNF.  Consultants: nephrology Procedures: liver biopsy  Antibiotics: Anti-infectives (From admission, onward)   Start     Dose/Rate Route Frequency Ordered Stop   09/29/17 1500  demeclocycline (DECLOMYCIN) tablet 300 mg     300 mg Oral Every 12 hours 09/29/17 1138         Objective: Physical Exam: Vitals:   10/03/17 1400 10/03/17 1500 10/03/17 1514 10/03/17 1530  BP: 137/64     Pulse: 88 94  93  Resp: 18 (!) 22  17  Temp:      TempSrc:      SpO2: 92% 90% 92% (!) 89%  Weight:      Height:        Intake/Output Summary (Last 24 hours) at 10/03/2017 1627 Last data filed at 10/03/2017 1500 Gross per 24 hour  Intake 800 ml  Output 2525 ml  Net -1725 ml   Filed Weights   09/27/17 1844  Weight: (!) 140.6 kg (310 lb)   General: Alert, Awake and Oriented to Time, Place and Person. Appear in moderate distress, affect appropriate Eyes: PERRL, Conjunctiva normal ENT: Oral Mucosa clear moist. Neck: difficult to assess  JVD, no Abnormal Mass Or lumps Cardiovascular: S1 and S2 Present, no Murmur, Peripheral Pulses Present Respiratory: normal respiratory effort, Bilateral Air entry equal and Decreased, no use of accessory muscle, Clear to Auscultation, no Crackles, no wheezes Abdomen: Bowel Sound present, Soft and no tenderness, no hernia Skin: on redness, no Rash, no induration Extremities: no Pedal edema, no calf tenderness Neurologic: Grossly no focal neuro deficit. Bilaterally Equal motor strength  Data Reviewed: CBC: Recent Labs  Lab 09/27/17 2000 09/28/17 0207  WBC 10.1 9.5  NEUTROABS 8.1*  --  HGB 16.5 14.7  HCT 44.8 41.6  MCV 87.5 88.1  PLT 134* 902*   Basic Metabolic Panel: Recent Labs  Lab 09/27/17 2000 09/28/17 0207  09/29/17 0156  10/01/17 1159 10/01/17 2345 10/02/17 1144 10/02/17 2339 10/03/17 0324  NA 117* 121*   < > 116*   < > 121* 122* 123* 124* 124*  K 4.7 4.3   < > 5.0   < > 4.9 4.7 4.9 5.2* 4.6  CL  79* 84*   < > 82*   < > 81* 82* 82* 82* 83*  CO2 27 27   < > 25   < > 27 30 28 30 30   GLUCOSE 143* 164*   < > 149*   < > 162* 129* 177* 176* 159*  BUN 9 11   < > 11   < > 35* 39* 44* 48* 48*  CREATININE 0.55* 0.60*   < > 0.36*   < > 1.19 1.03 1.30* 1.06 1.12  CALCIUM 8.8* 8.3*   < > 8.1*   < > 8.9 8.6* 8.8* 8.8* 8.6*  MG 1.8 1.6*  --  1.7  --   --   --   --   --   --   PHOS  --  2.8  --  2.6  --   --   --   --   --   --    < > = values in this interval not displayed.    Liver Function Tests: Recent Labs  Lab 09/27/17 2000 09/28/17 0754  AST 133* 103*  ALT 224* 176*  ALKPHOS 156* 145*  BILITOT 12.3* 11.1*  PROT 6.9 6.2*  ALBUMIN 3.1* 2.7*   Recent Labs  Lab 09/27/17 2000  LIPASE 85*   Recent Labs  Lab 09/27/17 2316  AMMONIA 33   Coagulation Profile: Recent Labs  Lab 09/28/17 0005  INR 1.09   Cardiac Enzymes: Recent Labs  Lab 09/27/17 2000 09/28/17 0207  CKTOTAL  --  69  TROPONINI <0.03  --    BNP (last 3 results) No results for input(s): PROBNP in the last 8760 hours. CBG: Recent Labs  Lab 10/02/17 1619 10/02/17 2139 10/03/17 0743 10/03/17 1125 10/03/17 1615  GLUCAP 156* 192* 165* 183* 210*   Studies: No results found.  Scheduled Meds: . demeclocycline  300 mg Oral Q12H  . dextromethorphan-guaiFENesin  1 tablet Oral BID  . famotidine  10 mg Oral Daily  . furosemide  160 mg Oral BID  . HYDROcodone-acetaminophen  10 mL Oral BID  . insulin aspart  0-15 Units Subcutaneous TID WC  . insulin aspart  0-5 Units Subcutaneous QHS  . ipratropium-albuterol  3 mL Nebulization TID  . mouth rinse  15 mL Mouth Rinse BID  . nicotine  21 mg Transdermal Daily  . omega-3 acid ethyl esters  1 g Oral BID  . senna  1 tablet Oral BID   Continuous Infusions: PRN Meds: albuterol, bisacodyl, ondansetron **OR** ondansetron (ZOFRAN) IV, polyethylene glycol, traMADol  Time spent: 35 minutes  Author: Berle Mull, MD [facility] Pager: [phone] 10/03/2017  4:27 PM  If 7PM-7AM, please contact night-coverage at [email or web address], password [facility]

## 2017-10-04 ENCOUNTER — Inpatient Hospital Stay (HOSPITAL_COMMUNITY): Payer: BLUE CROSS/BLUE SHIELD

## 2017-10-04 LAB — COMPREHENSIVE METABOLIC PANEL
ALT: 140 U/L — AB (ref 17–63)
ANION GAP: 11 (ref 5–15)
AST: 132 U/L — ABNORMAL HIGH (ref 15–41)
Albumin: 2.4 g/dL — ABNORMAL LOW (ref 3.5–5.0)
Alkaline Phosphatase: 219 U/L — ABNORMAL HIGH (ref 38–126)
BUN: 53 mg/dL — ABNORMAL HIGH (ref 6–20)
CHLORIDE: 83 mmol/L — AB (ref 101–111)
CO2: 32 mmol/L (ref 22–32)
CREATININE: 1.3 mg/dL — AB (ref 0.61–1.24)
Calcium: 8.8 mg/dL — ABNORMAL LOW (ref 8.9–10.3)
GFR, EST NON AFRICAN AMERICAN: 54 mL/min — AB (ref >60)
Glucose, Bld: 200 mg/dL — ABNORMAL HIGH (ref 65–99)
Potassium: 4.7 mmol/L (ref 3.5–5.1)
SODIUM: 126 mmol/L — AB (ref 135–145)
Total Bilirubin: 12.7 mg/dL — ABNORMAL HIGH (ref 0.3–1.2)
Total Protein: 6.5 g/dL (ref 6.5–8.1)

## 2017-10-04 LAB — GLUCOSE, CAPILLARY
GLUCOSE-CAPILLARY: 311 mg/dL — AB (ref 65–99)
Glucose-Capillary: 141 mg/dL — ABNORMAL HIGH (ref 65–99)
Glucose-Capillary: 330 mg/dL — ABNORMAL HIGH (ref 65–99)
Glucose-Capillary: 289 mg/dL — ABNORMAL HIGH (ref 65–99)

## 2017-10-04 LAB — CBC WITH DIFFERENTIAL/PLATELET
BASOS PCT: 0 %
Basophils Absolute: 0 10*3/uL (ref 0.0–0.1)
EOS ABS: 0.1 10*3/uL (ref 0.0–0.7)
Eosinophils Relative: 1 %
HEMATOCRIT: 39.6 % (ref 39.0–52.0)
HEMOGLOBIN: 14.6 g/dL (ref 13.0–17.0)
LYMPHS ABS: 0.8 10*3/uL (ref 0.7–4.0)
Lymphocytes Relative: 8 %
MCH: 31.7 pg (ref 26.0–34.0)
MCHC: 36.9 g/dL — ABNORMAL HIGH (ref 30.0–36.0)
MCV: 86.1 fL (ref 78.0–100.0)
Monocytes Absolute: 1.4 10*3/uL — ABNORMAL HIGH (ref 0.1–1.0)
Monocytes Relative: 14 %
NEUTROS ABS: 7.4 10*3/uL (ref 1.7–7.7)
NEUTROS PCT: 77 %
Platelets: 155 10*3/uL (ref 150–400)
RBC: 4.6 MIL/uL (ref 4.22–5.81)
RDW: 14.7 % (ref 11.5–15.5)
WBC: 9.6 10*3/uL (ref 4.0–10.5)

## 2017-10-04 LAB — MAGNESIUM: MAGNESIUM: 2.3 mg/dL (ref 1.7–2.4)

## 2017-10-04 MED ORDER — ENSURE ENLIVE PO LIQD: 237.0000 mL | Freq: Two times a day (BID) | ORAL | Status: DC

## 2017-10-04 MED ORDER — PROCHLORPERAZINE EDISYLATE 10 MG/2ML IJ SOLN: 10.0000 mg | INTRAMUSCULAR | Status: DC | PRN

## 2017-10-04 MED ORDER — HEPARIN SOD (PORK) LOCK FLUSH 100 UNIT/ML IV SOLN
250.0000 [IU] | Freq: Once | INTRAVENOUS | Status: DC | PRN
  Filled 2017-10-04: qty 2.5

## 2017-10-04 MED ORDER — SODIUM CHLORIDE 0.9 % IV SOLN
10.0000 mg | Freq: Once | INTRAVENOUS | Status: AC
  Administered 2017-10-04: 10 mg via INTRAVENOUS
  Filled 2017-10-04 (×3): qty 1

## 2017-10-04 MED ORDER — SENNOSIDES-DOCUSATE SODIUM 8.6-50 MG PO TABS
1.0000 | ORAL_TABLET | Freq: Two times a day (BID) | ORAL | Status: DC
  Administered 2017-10-04 – 2017-10-06 (×5): 1 via ORAL
  Filled 2017-10-04 (×5): qty 1

## 2017-10-04 MED ORDER — GLUCERNA SHAKE PO LIQD
237.0000 mL | Freq: Two times a day (BID) | ORAL | Status: DC
  Administered 2017-10-04 (×2): 237 mL via ORAL
  Filled 2017-10-04 (×10): qty 237

## 2017-10-04 MED ORDER — POLYETHYLENE GLYCOL 3350 17 G PO PACK
17.0000 g | PACK | Freq: Every day | ORAL | Status: DC
  Administered 2017-10-04 – 2017-10-08 (×3): 17 g via ORAL
  Filled 2017-10-04 (×4): qty 1

## 2017-10-04 MED ORDER — SODIUM CHLORIDE 0.9 % IV SOLN
520.0000 mg | Freq: Once | INTRAVENOUS | Status: AC
  Administered 2017-10-04: 520 mg via INTRAVENOUS
  Filled 2017-10-04: qty 52

## 2017-10-04 MED ORDER — HOT PACK MISC ONCOLOGY
1.0000 | Freq: Once | Status: AC | PRN
  Filled 2017-10-04: qty 1

## 2017-10-04 MED ORDER — SODIUM CHLORIDE 0.9% FLUSH: 3.0000 mL | INTRAVENOUS | Status: DC | PRN

## 2017-10-04 MED ORDER — SODIUM CHLORIDE 0.9 % IV SOLN
Freq: Once | INTRAVENOUS | Status: AC
  Administered 2017-10-04: 11:00:00 via INTRAVENOUS

## 2017-10-04 MED ORDER — PALONOSETRON HCL INJECTION 0.25 MG/5ML
0.2500 mg | Freq: Once | INTRAVENOUS | Status: AC
  Administered 2017-10-04: 0.25 mg via INTRAVENOUS
  Filled 2017-10-04: qty 5

## 2017-10-04 MED ORDER — ETOPOSIDE CHEMO INJECTION 1 GM/50ML
50.0000 mg/m2 | Freq: Once | INTRAVENOUS | Status: AC
  Administered 2017-10-04: 140 mg via INTRAVENOUS
  Filled 2017-10-04: qty 7

## 2017-10-04 MED ORDER — ALTEPLASE 2 MG IJ SOLR
2.0000 mg | Freq: Once | INTRAMUSCULAR | Status: DC | PRN
  Filled 2017-10-04: qty 2

## 2017-10-04 MED ORDER — SODIUM CHLORIDE 0.9% FLUSH: 10.0000 mL | INTRAVENOUS | Status: DC | PRN

## 2017-10-04 NOTE — Progress Notes (Signed)
Patient tolerated carboplatin and etoposide well . Vital signs within normal  Limits.

## 2017-10-04 NOTE — Progress Notes (Signed)
   10/04/17 1000  Clinical Encounter Type  Visited With Patient and family together;Health care provider  Visit Type Initial  Referral From Nurse  Consult/Referral To Chaplain  Spiritual Encounters  Spiritual Needs Emotional  Stress Factors  Patient Stress Factors Health changes  Family Stress Factors Health changes   Responded to a SCC to support the patient has had learned yesterday about his cancer diagnosis.  Daughter at bedside and they both seem to still be in shock.  Patient stated he was better today and ready to start the chemo.  Daughter had questions about HCPA and I answered those for she and the patient.  Patient is well supported by family.  Patient's wife died 35 years ago.  Will follow and support as needed. Chaplain Katherene Ponto

## 2017-10-04 NOTE — Progress Notes (Signed)
[facility] ROUNDING NOTE   Subjective:   This is a very unfortunate situation of a 72 year old smoker with history of diabetes and hypertension that was admitted with apical lung mass and also lymphadenopathy and possible liver metastasis. He was admitted with a strong suspicion of SIADH and is being managed with fluid restriction and demeclocycline and lasix    He has a sodium of 126  today . Urine output looks excellent on lasix  And continues to do well   Metastatic small cell cancer diagnosed   Objective:  Vital signs in last 24 hours:  Temp:  [97.1 F (36.2 C)-98.6 F (37 C)] 97.5 F (36.4 C) (06/21 1120) Pulse Rate:  [86-103] 92 (06/21 1120) Resp:  [16-27] 23 (06/21 1120) BP: (94-137)/(41-64) 106/46 (06/21 1120) SpO2:  [88 %-96 %] 95 % (06/21 1120)  Weight change:  Filed Weights   09/27/17 1844  Weight: (!) 310 lb (140.6 kg)    Intake/Output: I/O last 3 completed shifts: In: 7035 [P.O.:1253] Out: 3520 [KKXFG:1829]   Intake/Output this shift:  No intake/output data recorded.  CVS- RRR RS- CTA ABD- BS present soft non-distended  Normal bowel sound  EXT- no edema   Basic Metabolic Panel: Recent Labs  Lab 09/27/17 2000 09/28/17 0207  09/29/17 0156  10/02/17 1144 10/02/17 2339 10/03/17 0324 10/03/17 1602 10/04/17 0319  NA 117* 121*   < > 116*   < > 123* 124* 124* 126* 126*  K 4.7 4.3   < > 5.0   < > 4.9 5.2* 4.6 4.7 4.7  CL 79* 84*   < > 82*   < > 82* 82* 83* 83* 83*  CO2 27 27   < > 25   < > 28 30 30 30  32  GLUCOSE 143* 164*   < > 149*   < > 177* 176* 159* 186* 200*  BUN 9 11   < > 11   < > 44* 48* 48* 52* 53*  CREATININE 0.55* 0.60*   < > 0.36*   < > 1.30* 1.06 1.12 1.13 1.30*  CALCIUM 8.8* 8.3*   < > 8.1*   < > 8.8* 8.8* 8.6* 8.9 8.8*  MG 1.8 1.6*  --  1.7  --   --   --   --   --  2.3  PHOS  --  2.8  --  2.6  --   --   --   --   --   --    < > = values in this interval not displayed.    Liver Function Tests: Recent Labs  Lab  09/27/17 2000 09/28/17 0754 10/04/17 0319  AST 133* 103* 132*  ALT 224* 176* 140*  ALKPHOS 156* 145* 219*  BILITOT 12.3* 11.1* 12.7*  PROT 6.9 6.2* 6.5  ALBUMIN 3.1* 2.7* 2.4*   Recent Labs  Lab 09/27/17 2000  LIPASE 85*   Recent Labs  Lab 09/27/17 2316  AMMONIA 33    CBC: Recent Labs  Lab 09/27/17 2000 09/28/17 0207 10/04/17 0319  WBC 10.1 9.5 9.6  NEUTROABS 8.1*  --  7.4  HGB 16.5 14.7 14.6  HCT 44.8 41.6 39.6  MCV 87.5 88.1 86.1  PLT 134* 127* 155    Cardiac Enzymes: Recent Labs  Lab 09/27/17 2000 09/28/17 0207  CKTOTAL  --  78  TROPONINI <0.03  --     BNP: Invalid input(s): POCBNP  CBG: Recent Labs  Lab 10/03/17 0743 10/03/17 1125 10/03/17 1615 10/03/17  2123 10/04/17 0750  GLUCAP 165* 183* 210* 188* 141*    Microbiology: Results for orders placed or performed during the hospital encounter of 09/27/17  MRSA PCR Screening     Status: None   Collection Time: 09/28/17 12:00 AM  Result Value Ref Range Status   MRSA by PCR NEGATIVE NEGATIVE Final    Comment:        The GeneXpert MRSA Assay (FDA approved for NASAL specimens only), is one component of a comprehensive MRSA colonization surveillance program. It is not intended to diagnose MRSA infection nor to guide or monitor treatment for MRSA infections. Performed at [facility], [place] [address] [place], [place] [address]   Urine culture     Status: Abnormal   Collection Time: 09/28/17 12:30 AM  Result Value Ref Range Status   Specimen Description   Final    URINE, CATHETERIZED Performed at [facility] [address], [place], [place] [address]    Special Requests   Final    Normal Performed at [facility], [place] [address], [place], [place] [address]    Culture (A)  Final    <10,000 COLONIES/mL INSIGNIFICANT GROWTH Performed at [facility] [address] [place], [place] [address]    Report Status 09/29/2017  FINAL  Final    Coagulation Studies: No results for input(s): LABPROT, INR in the last 72 hours.  Urinalysis: No results for input(s): COLORURINE, LABSPEC, PHURINE, GLUCOSEU, HGBUR, BILIRUBINUR, KETONESUR, PROTEINUR, UROBILINOGEN, NITRITE, LEUKOCYTESUR in the last 72 hours.  Invalid input(s): APPERANCEUR    Imaging: No results found.   Medications:   . sodium chloride     . CARBOplatin  520 mg Intravenous Once  . demeclocycline  300 mg Oral Q12H  . dextromethorphan-guaiFENesin  1 tablet Oral BID  . etoposide  50 mg/m2 (Treatment Plan Recorded) Intravenous Once  . famotidine  10 mg Oral Daily  . feeding supplement (GLUCERNA SHAKE)  237 mL Oral BID BM  . furosemide  160 mg Oral BID  . HYDROcodone-acetaminophen  10 mL Oral BID  . insulin aspart  0-15 Units Subcutaneous TID WC  . insulin aspart  0-5 Units Subcutaneous QHS  . ipratropium-albuterol  3 mL Nebulization TID  . mouth rinse  15 mL Mouth Rinse BID  . nicotine  21 mg Transdermal Daily  . omega-3 acid ethyl esters  1 g Oral BID  . polyethylene glycol  17 g Oral Daily  . senna-docusate  1 tablet Oral BID   albuterol, alteplase, bisacodyl, heparin lock flush, Hot Pack, prochlorperazine, sodium chloride flush, sodium chloride flush, traMADol  Assessment/ Plan:   Hyponatremia  This appears to be consistent with SIADH   Noted normal TSH  With urine osm 532 and urine sodium of 42  He has normal renal function   His sodium really has not changed much in the last 24 hours and would continue the current coarse today   Tolvaptan is not being recommended due to the increase in LFT. Stopped diclofenac  He is to be more aggressively fluid restricted  Sodium has improved 126  Metastatic cancer to liver   - small cell   Diabetes  Patients outpatient metformin is being held at this point  Most likely has small cell cancer with mets   I will sign off continue current plan of fluid restriction and demeclocycline       LOS:  7 Britiany Silbernagel W @TODAY @11 :33 AM

## 2017-10-04 NOTE — Progress Notes (Signed)
Inpatient Diabetes Program Recommendations  AACE/ADA: New Consensus Statement on Inpatient Glycemic Control (2015)  Target Ranges:  Prepandial:   less than 140 mg/dL      Peak postprandial:   less than 180 mg/dL (1-2 hours)      Critically ill patients:  140 - 180 mg/dL   Lab Results  Component Value Date   GLUCAP 311 (H) 10/04/2017   HGBA1C 7.2 (H) 09/28/2017    Review of Glycemic Control  Diabetes history: DM2 Outpatient Diabetes medications: metformin 1000 mg bid Current orders for Inpatient glycemic control: Novolog 0-15 units tidwc and hs  Received Decadron 10 mg this am. Blood sugars likely elevated from steroids.   Inpatient Diabetes Program Recommendations:     Add Novolog 3 units tidwc for meal coverage insulin.  Continue to follow.  Thank you. Lorenda Peck, RD, LDN, CDE Inpatient Diabetes Coordinator [phone]

## 2017-10-04 NOTE — Progress Notes (Signed)
LCSW following for SNF placement.   LCSW met with patient to give bed offers.   Patient request that LCSW discuss bed offers with patient's daughter, [name].   LCSW left message with daughter to discussed offers.   LCSW will continue to follow for dc needs.   Carolin Coy [place]

## 2017-10-04 NOTE — Progress Notes (Signed)
[facility] Progress Note  Patient: Robert Compton [id number]:[id number]   PCP: [name], MD DOB: 10-12-1945   DOA: 09/27/2017   DOS: 10/04/2017   Date of Service: the patient was seen and examined on 10/04/2017  Subjective: Feeling better, no change in patient's fatigue.  No nausea no vomiting.  Abdominal pain.  Had a bowel movement last night.  No blood in the stool.  Brief hospital course: Pt. with PMH of 2 DM, right TKR; admitted on 09/27/2017, presented with complaint of shortness of breath, was found to have natremia secondary to SIADH secondary to newly diagnosed metastatic small cell cancer with liver metastasis. Currently further plan is continue current management.  Assessment and Plan: 1.  Hyponatremia. Secondary to SIADH. Metastatic small cell lung carcinoma with liver mets. Sodium 126. Patient was started on demeclocycline and Lasix, continue. Avapro, Norvasc, diclofenac, chlorpheniramine DC'd Appreciate nephrology assistance. neurologically patient still feels fatigued and tired.  No focal deficit Continue to monitor at present stepdown.  2.  Metastatic small cell lung carcinoma. Liver metastasis. LFTs are mildly worsening, bilirubin 13 Oncology consulted.  Highly appreciate Dr. Worthy Flank assistance in initiating chemotherapy here in the hospital. Patient was explained the intent is palliative only.  3.  Acute hypoxic respiratory failure. Hemoptysis and wheezing. Possible COPD. Former smoker. Hemoptysis is resolved, cough is improved.  Wheezing is also resolved. Patient requiring 5 L of oxygen at present this morning. Get chest x-ray. Provide incentive spirometry. Already on Lasix with negative in and out. Continue nicotine patch. Continue nebulizers as needed. Continue Mucinex.  4.  Diabetes mellitus.  Type II Hemoglobin A1c 7.2. Controlled, no complication.,  No long-term insulin use. Holding metformin. Continue insulin sliding scale Glucerna.  5.   GERD On stool softener.  Monitor.  6.  Essential hypertension. Blood pressure is currently relatively on the softer side, discontinue all antihypertensive medication except Lasix.  Diet: Carb modified, fluid restricted to 1.5 L. DVT Prophylaxis: subcutaneous Heparin  Advance goals of care discussion: full code  Family Communication: no family was present at bedside, at the time of interview.  Disposition:  Discharge to SNF.  Consultants: nephrology, oncology, IR Procedures: liver biopsy  Antibiotics: Anti-infectives (From admission, onward)   Start     Dose/Rate Route Frequency Ordered Stop   09/29/17 1500  demeclocycline (DECLOMYCIN) tablet 300 mg     300 mg Oral Every 12 hours 09/29/17 1138         Objective: Physical Exam: Vitals:   10/04/17 0500 10/04/17 0600 10/04/17 0751 10/04/17 0805  BP:  (!) 112/53    Pulse: 88 86    Resp: 16 17    Temp:   (!) 97.1 F (36.2 C) 97.6 F (36.4 C)  TempSrc:   Axillary Axillary  SpO2: 95% 94%  94%  Weight:      Height:        Intake/Output Summary (Last 24 hours) at 10/04/2017 0824 Last data filed at 10/04/2017 0500 Gross per 24 hour  Intake 813 ml  Output 2220 ml  Net -1407 ml   Filed Weights   09/27/17 1844  Weight: (!) 140.6 kg (310 lb)   General: Alert, Awake and Oriented to Time, Place and Person. Appear in moderate distress, affect appropriate Eyes: PERRL, Conjunctiva normal ENT: Oral Mucosa clear moist. Neck: difficult to assess  JVD, no Abnormal Mass Or lumps Cardiovascular: S1 and S2 Present, no Murmur, Peripheral Pulses Present Respiratory: normal respiratory effort, Bilateral Air entry equal and Decreased, no use of accessory muscle,  basal Crackles, no wheezes Abdomen: Bowel Sound present, Soft and no tenderness, no hernia Skin: on redness, no Rash, no induration Extremities: no Pedal edema, no calf tenderness Neurologic: Grossly no focal neuro deficit. Bilaterally Equal motor strength  Data  Reviewed: CBC: Recent Labs  Lab 09/27/17 2000 09/28/17 0207  WBC 10.1 9.5  NEUTROABS 8.1*  --   HGB 16.5 14.7  HCT 44.8 41.6  MCV 87.5 88.1  PLT 134* 462*   Basic Metabolic Panel: Recent Labs  Lab 09/27/17 2000 09/28/17 0207  09/29/17 0156  10/02/17 1144 10/02/17 2339 10/03/17 0324 10/03/17 1602 10/04/17 0319  NA 117* 121*   < > 116*   < > 123* 124* 124* 126* 126*  K 4.7 4.3   < > 5.0   < > 4.9 5.2* 4.6 4.7 4.7  CL 79* 84*   < > 82*   < > 82* 82* 83* 83* 83*  CO2 27 27   < > 25   < > 28 30 30 30  32  GLUCOSE 143* 164*   < > 149*   < > 177* 176* 159* 186* 200*  BUN 9 11   < > 11   < > 44* 48* 48* 52* 53*  CREATININE 0.55* 0.60*   < > 0.36*   < > 1.30* 1.06 1.12 1.13 1.30*  CALCIUM 8.8* 8.3*   < > 8.1*   < > 8.8* 8.8* 8.6* 8.9 8.8*  MG 1.8 1.6*  --  1.7  --   --   --   --   --  2.3  PHOS  --  2.8  --  2.6  --   --   --   --   --   --    < > = values in this interval not displayed.    Liver Function Tests: Recent Labs  Lab 09/27/17 2000 09/28/17 0754 10/04/17 0319  AST 133* 103* 132*  ALT 224* 176* 140*  ALKPHOS 156* 145* 219*  BILITOT 12.3* 11.1* 12.7*  PROT 6.9 6.2* 6.5  ALBUMIN 3.1* 2.7* 2.4*   Recent Labs  Lab 09/27/17 2000  LIPASE 85*   Recent Labs  Lab 09/27/17 2316  AMMONIA 33   Coagulation Profile: Recent Labs  Lab 09/28/17 0005  INR 1.09   Cardiac Enzymes: Recent Labs  Lab 09/27/17 2000 09/28/17 0207  CKTOTAL  --  21  TROPONINI <0.03  --    BNP (last 3 results) No results for input(s): PROBNP in the last 8760 hours. CBG: Recent Labs  Lab 10/03/17 0743 10/03/17 1125 10/03/17 1615 10/03/17 2123 10/04/17 0750  GLUCAP 165* 183* 210* 188* 141*   Studies: No results found.  Scheduled Meds: . demeclocycline  300 mg Oral Q12H  . dextromethorphan-guaiFENesin  1 tablet Oral BID  . famotidine  10 mg Oral Daily  . feeding supplement (GLUCERNA SHAKE)  237 mL Oral BID BM  . furosemide  160 mg Oral BID  . HYDROcodone-acetaminophen   10 mL Oral BID  . insulin aspart  0-15 Units Subcutaneous TID WC  . insulin aspart  0-5 Units Subcutaneous QHS  . ipratropium-albuterol  3 mL Nebulization TID  . mouth rinse  15 mL Mouth Rinse BID  . nicotine  21 mg Transdermal Daily  . omega-3 acid ethyl esters  1 g Oral BID  . polyethylene glycol  17 g Oral Daily  . senna-docusate  1 tablet Oral BID   Continuous Infusions: PRN Meds: albuterol, bisacodyl, ondansetron **OR** ondansetron (ZOFRAN)  IV, traMADol  Time spent: 35 minutes  Author: Berle Mull, MD [facility] Pager: [phone] 10/04/2017 8:24 AM  If 7PM-7AM, please contact night-coverage at [email or web address], password [facility]

## 2017-10-04 NOTE — Progress Notes (Signed)
Carboplatin and Etoposide teaching given to patient and family at bedside. Questions were answered.  information handouts were  given to patient and family. Will continue to monitor

## 2017-10-04 NOTE — Progress Notes (Signed)
Physical Therapy Treatment Patient Details Name: Robert Compton MRN: [id number] DOB: 10-23-1945 Today's Date: 10/04/2017    History of Present Illness 72 y.o. male with medical history significant of hypertension diabetes type 2, right TKR and  new diagnosis of metastatic cancer likely lung with liver mets admitted due to weakness and nausea    PT Comments    Patient not progressing this session as self limited to in bed therex due to having chemo today.  Also declared he will not go to a SNF and is only going to go home.  Encouraged OOB as that is only way he can get home but seems he will have someone with him so may need to initiate caregiver education with hoyer lift and plan for [facility] services to be maximized.  PT to follow acutely.   Follow Up Recommendations  SNF(if refused SNF needs HHPT/RN/HHaide)     Equipment Recommendations  Other (comment)(if he decides to d/c home would need hospital bed, w/c, hoyer lift, 3:1 and ambulance transport)    Recommendations for Other Services       Precautions / Restrictions Precautions Precautions: Fall Precaution Comments: monitor vitals:  orthostatic    Mobility  Bed Mobility               General bed mobility comments: refused OOB due to having had chemo today  Transfers                    Ambulation/Gait                 Stairs             Wheelchair Mobility    Modified Rankin (Stroke Patients Only)       Balance                                            Cognition Arousal/Alertness: Awake/alert Behavior During Therapy: Flat affect Overall Cognitive Status: Impaired/Different from baseline Area of Impairment: Safety/judgement                         Safety/Judgement: Decreased awareness of deficits;Decreased awareness of safety     General Comments: refusing SNF even though RN reports took 4 people to help him back to bed yesterday      Exercises General  Exercises - Lower Extremity Ankle Circles/Pumps: AROM;15 reps;Seated;Both Quad Sets: 10 reps;Both;Supine;Strengthening Short Arc Quad: 10 reps;Both;Supine;AROM Heel Slides: AAROM;Both;Supine;10 reps Hip ABduction/ADduction: AROM;Strengthening;Both;Supine;10 reps    General Comments        Pertinent Vitals/Pain Pain Assessment: No/denies pain    Home Living                      Prior Function            PT Goals (current goals can now be found in the care plan section) Progress towards PT goals: Not progressing toward goals - comment(self limited to in bed therapy)    Frequency    Min 3X/week      PT Plan Current plan remains appropriate    Co-evaluation              AM-PAC PT "6 Clicks" Daily Activity  Outcome Measure  Difficulty turning over in bed (including adjusting bedclothes, sheets and blankets)?: A Lot Difficulty moving from lying on back  to sitting on the side of the bed? : Unable Difficulty sitting down on and standing up from a chair with arms (e.g., wheelchair, bedside commode, etc,.)?: Unable Help needed moving to and from a bed to chair (including a wheelchair)?: Total Help needed walking in hospital room?: Total Help needed climbing 3-5 steps with a railing? : Total 6 Click Score: 7    End of Session   Activity Tolerance: Patient limited by fatigue Patient left: in bed;with call bell/phone within reach;with family/visitor present   PT Visit Diagnosis: Difficulty in walking, not elsewhere classified (R26.2);Muscle weakness (generalized) (M62.81)     Time: 7322-0254 PT Time Calculation (min) (ACUTE ONLY): 17 min  Charges:  $Therapeutic Exercise: 8-22 mins                    G CodesMagda Kiel, [place] [phone] 10/04/2017    Reginia Naas 10/04/2017, 3:50 PM

## 2017-10-05 DIAGNOSIS — C771 Secondary and unspecified malignant neoplasm of intrathoracic lymph nodes: Secondary | ICD-10-CM

## 2017-10-05 DIAGNOSIS — Z5111 Encounter for antineoplastic chemotherapy: Secondary | ICD-10-CM

## 2017-10-05 LAB — CBC
HCT: 39 % (ref 39.0–52.0)
HEMOGLOBIN: 14.1 g/dL (ref 13.0–17.0)
MCH: 32.1 pg (ref 26.0–34.0)
MCHC: 36.2 g/dL — AB (ref 30.0–36.0)
MCV: 88.8 fL (ref 78.0–100.0)
Platelets: 154 10*3/uL (ref 150–400)
RBC: 4.39 MIL/uL (ref 4.22–5.81)
RDW: 14.7 % (ref 11.5–15.5)
WBC: 9.5 10*3/uL (ref 4.0–10.5)

## 2017-10-05 LAB — COMPREHENSIVE METABOLIC PANEL
ALK PHOS: 223 U/L — AB (ref 38–126)
ALT: 126 U/L — AB (ref 17–63)
AST: 106 U/L — ABNORMAL HIGH (ref 15–41)
Albumin: 2.5 g/dL — ABNORMAL LOW (ref 3.5–5.0)
Anion gap: 12 (ref 5–15)
BILIRUBIN TOTAL: 12.3 mg/dL — AB (ref 0.3–1.2)
BUN: 53 mg/dL — ABNORMAL HIGH (ref 6–20)
CALCIUM: 8.9 mg/dL (ref 8.9–10.3)
CO2: 33 mmol/L — AB (ref 22–32)
CREATININE: 0.98 mg/dL (ref 0.61–1.24)
Chloride: 86 mmol/L — ABNORMAL LOW (ref 101–111)
Glucose, Bld: 210 mg/dL — ABNORMAL HIGH (ref 65–99)
Potassium: 4.8 mmol/L (ref 3.5–5.1)
SODIUM: 131 mmol/L — AB (ref 135–145)
TOTAL PROTEIN: 6.3 g/dL — AB (ref 6.5–8.1)

## 2017-10-05 LAB — GLUCOSE, CAPILLARY
GLUCOSE-CAPILLARY: 192 mg/dL — AB (ref 65–99)
GLUCOSE-CAPILLARY: 318 mg/dL — AB (ref 65–99)
Glucose-Capillary: 308 mg/dL — ABNORMAL HIGH (ref 65–99)
Glucose-Capillary: 261 mg/dL — ABNORMAL HIGH (ref 65–99)

## 2017-10-05 MED ORDER — MAGNESIUM CITRATE PO SOLN
1.0000 | Freq: Once | ORAL | Status: AC
  Administered 2017-10-05: 1 via ORAL
  Filled 2017-10-05: qty 296

## 2017-10-05 MED ORDER — SODIUM CHLORIDE 0.9 % IV SOLN
Freq: Once | INTRAVENOUS | Status: AC
  Administered 2017-10-05: 12:00:00 via INTRAVENOUS

## 2017-10-05 MED ORDER — SODIUM CHLORIDE 0.9 % IV SOLN
50.0000 mg/m2 | Freq: Once | INTRAVENOUS | Status: AC
  Administered 2017-10-05: 140 mg via INTRAVENOUS
  Filled 2017-10-05: qty 7

## 2017-10-05 MED ORDER — DEXAMETHASONE SODIUM PHOSPHATE 100 MG/10ML IJ SOLN
10.0000 mg | Freq: Once | INTRAMUSCULAR | Status: AC
  Administered 2017-10-05: 10 mg via INTRAVENOUS
  Filled 2017-10-05: qty 1

## 2017-10-05 MED ORDER — FUROSEMIDE 40 MG PO TABS
40.0000 mg | ORAL_TABLET | Freq: Two times a day (BID) | ORAL | Status: DC
  Administered 2017-10-05 – 2017-10-08 (×7): 40 mg via ORAL
  Filled 2017-10-05 (×7): qty 1

## 2017-10-05 NOTE — Progress Notes (Signed)
[facility] ROUNDING NOTE   Subjective:   This is a very unfortunate situation of a 72 year old smoker with history of diabetes and hypertension that was admitted with apical lung mass and also lymphadenopathy and possible liver metastasis. He was admitted with a strong suspicion of SIADH and is being managed with fluid restriction and demeclocycline and lasix    He has a sodium of 131  today . Urine output looks excellent on lasix  And continues to do well   Metastatic small cell cancer diagnosed   Objective:  Vital signs in last 24 hours:  Temp:  [97.3 F (36.3 C)-98 F (36.7 C)] 97.6 F (36.4 C) (06/22 0800) Pulse Rate:  [80-95] 90 (06/22 0921) Resp:  [15-23] 20 (06/22 0921) BP: (106-134)/(43-62) 126/52 (06/22 0921) SpO2:  [91 %-95 %] 94 % (06/22 0931)  Weight change:  Filed Weights   09/27/17 1844  Weight: (!) 310 lb (140.6 kg)    Intake/Output: I/O last 3 completed shifts: In: 1866 [P.O.:1866] Out: 4820 [Urine:4820]   Intake/Output this shift:  Total I/O In: 230 [P.O.:230] Out: -   CVS- RRR RS- CTA ABD- BS present soft non-distended  Normal bowel sound  EXT- no edema   Basic Metabolic Panel: Recent Labs  Lab 09/29/17 0156  10/02/17 2339 10/03/17 0324 10/03/17 1602 10/04/17 0319 10/05/17 0802  NA 116*   < > 124* 124* 126* 126* 131*  K 5.0   < > 5.2* 4.6 4.7 4.7 4.8  CL 82*   < > 82* 83* 83* 83* 86*  CO2 25   < > 30 30 30  32 33*  GLUCOSE 149*   < > 176* 159* 186* 200* 210*  BUN 11   < > 48* 48* 52* 53* 53*  CREATININE 0.36*   < > 1.06 1.12 1.13 1.30* 0.98  CALCIUM 8.1*   < > 8.8* 8.6* 8.9 8.8* 8.9  MG 1.7  --   --   --   --  2.3  --   PHOS 2.6  --   --   --   --   --   --    < > = values in this interval not displayed.    Liver Function Tests: Recent Labs  Lab 10/04/17 0319 10/05/17 0802  AST 132* 106*  ALT 140* 126*  ALKPHOS 219* 223*  BILITOT 12.7* 12.3*  PROT 6.5 6.3*  ALBUMIN 2.4* 2.5*   No results for input(s): LIPASE,  AMYLASE in the last 168 hours. No results for input(s): AMMONIA in the last 168 hours.  CBC: Recent Labs  Lab 10/04/17 0319 10/05/17 0802  WBC 9.6 9.5  NEUTROABS 7.4  --   HGB 14.6 14.1  HCT 39.6 39.0  MCV 86.1 88.8  PLT 155 154    Cardiac Enzymes: No results for input(s): CKTOTAL, CKMB, CKMBINDEX, TROPONINI in the last 168 hours.  BNP: Invalid input(s): POCBNP  CBG: Recent Labs  Lab 10/04/17 0750 10/04/17 1217 10/04/17 1650 10/04/17 2120 10/05/17 0824  GLUCAP 141* 311* 330* 43* 192*    Microbiology: Results for orders placed or performed during the hospital encounter of 09/27/17  MRSA PCR Screening     Status: None   Collection Time: 09/28/17 12:00 AM  Result Value Ref Range Status   MRSA by PCR NEGATIVE NEGATIVE Final    Comment:        The GeneXpert MRSA Assay (FDA approved for NASAL specimens only), is one component of a comprehensive MRSA colonization surveillance  program. It is not intended to diagnose MRSA infection nor to guide or monitor treatment for MRSA infections. Performed at [facility], [place] [address] [place], [place] [address]   Urine culture     Status: Abnormal   Collection Time: 09/28/17 12:30 AM  Result Value Ref Range Status   Specimen Description   Final    URINE, CATHETERIZED Performed at [facility] [address], [place], [place] [address]    Special Requests   Final    Normal Performed at [facility], [place] [address], [place], [place] [address]    Culture (A)  Final    <10,000 COLONIES/mL INSIGNIFICANT GROWTH Performed at [facility] [address], [place], [place] [address]    Report Status 09/29/2017 FINAL  Final    Coagulation Studies: No results for input(s): LABPROT, INR in the last 72 hours.  Urinalysis: No results for input(s): COLORURINE, LABSPEC, PHURINE, GLUCOSEU, HGBUR, BILIRUBINUR, KETONESUR, PROTEINUR, UROBILINOGEN, NITRITE,  LEUKOCYTESUR in the last 72 hours.  Invalid input(s): APPERANCEUR    Imaging: Dg Chest Port 1 View  Result Date: 10/04/2017 CLINICAL DATA:  Shortness of breath EXAM: PORTABLE CHEST 1 VIEW COMPARISON:  Portable exam 0936 hours compared to 09/27/2017 FINDINGS: Upper normal heart size. Atherosclerotic calcification aorta. Mediastinal contours and pulmonary vascularity normal. Lungs clear. No pleural effusion or pneumothorax. No acute osseous findings. IMPRESSION: No acute abnormalities. Electronically Signed   By: Lavonia Dana M.D.   On: 10/04/2017 11:38     Medications:   . sodium chloride    . dexamethasone (DECADRON) IVPB [facility]     . demeclocycline  300 mg Oral Q12H  . dextromethorphan-guaiFENesin  1 tablet Oral BID  . etoposide  50 mg/m2 (Treatment Plan Recorded) Intravenous Once  . famotidine  10 mg Oral Daily  . feeding supplement (GLUCERNA SHAKE)  237 mL Oral BID BM  . furosemide  160 mg Oral BID  . HYDROcodone-acetaminophen  10 mL Oral BID  . insulin aspart  0-15 Units Subcutaneous TID WC  . insulin aspart  0-5 Units Subcutaneous QHS  . ipratropium-albuterol  3 mL Nebulization TID  . mouth rinse  15 mL Mouth Rinse BID  . nicotine  21 mg Transdermal Daily  . omega-3 acid ethyl esters  1 g Oral BID  . polyethylene glycol  17 g Oral Daily  . senna-docusate  1 tablet Oral BID   albuterol, alteplase, bisacodyl, heparin lock flush, prochlorperazine, sodium chloride flush, sodium chloride flush, traMADol  Assessment/ Plan:   Hyponatremia  This appears to be consistent with SIADH   Noted normal TSH  With urine osm 532 and urine sodium of 42  He has normal renal function   His sodium really has not changed much in the last 24 hours and would continue the current coarse today   Tolvaptan is not being recommended due to the increase in LFT. Stopped diclofenac  He is to be more aggressively fluid restricted  Sodium has improved 126  Metastatic cancer to liver   - small cell   Diabetes   Patients outpatient metformin is being held at this point  Most likely has small cell cancer with mets   I will sign off continue current plan of fluid restriction and demeclocycline       LOS: 8 Lamanda Rudder W @TODAY @10 :21 AM

## 2017-10-05 NOTE — Progress Notes (Signed)
Called report to [facility] on [address], pt transferred in the bed with 3 liters O2. Telemetry discontinued.

## 2017-10-05 NOTE — Progress Notes (Signed)
[facility] Progress Note  Patient: Robert Compton IOX:[id number]   PCP: [name], MD DOB: May 19, 1945   DOA: 09/27/2017   DOS: 10/05/2017   Date of Service: the patient was seen and examined on 10/05/2017  Subjective: No acute complaint.  Tolerated first round of chemotherapy yesterday.  No nausea no vomiting.  Oral intake is better but no BM today.  Brief hospital course: Pt. with PMH of 2 DM, right TKR; admitted on 09/27/2017, presented with complaint of shortness of breath, was found to have natremia secondary to SIADH secondary to newly diagnosed metastatic small cell cancer with liver metastasis.  Started on chemotherapy inpatient.  At present we will continue to monitor. Currently further plan is continue current management.  Assessment and Plan: 1.  Hyponatremia. Secondary to SIADH. Metastatic small cell lung carcinoma with liver mets. Sodium 131. Patient was started on demeclocycline and Lasix, continue. Cut lasix from 160 bid to 40 bid for now while he is getting chemo Avapro, Norvasc, diclofenac, chlorpheniramine DC'd Appreciate nephrology assistance. neurologically patient still feels fatigued and tired.  No focal deficit  2.  Metastatic small cell lung carcinoma. Liver metastasis. LFTs are mildly worsening, bilirubin 13 Oncology consulted.  Highly appreciate Dr. Worthy Flank assistance in initiating chemotherapy here in the hospital. Patient was explained the intent is palliative only.  3.  Acute hypoxic respiratory failure. Hemoptysis and wheezing. Possible COPD. Former smoker. Hemoptysis is resolved, cough is improved.  Wheezing is also resolved. Patient requiring 3L of oxygen at present this morning. Unremarkable chest x-ray. Provide incentive spirometry. Already on Lasix with negative in and out. Continue nicotine patch. Continue nebulizers as needed. Continue Mucinex.  4.  Diabetes mellitus.  Type II, uncontrolled with hyperglycemia secondary to  steroids Hemoglobin A1c 7.2. Controlled, no complication.,  No long-term insulin use. Holding metformin. Continue insulin sliding scale Glucerna. Monitor for now but if hyperglycemia persists patient may require scheduled long-acting insulin short-term.  5.  GERD On stool softener.  Monitor.  6.  Essential hypertension. Blood pressure is currently relatively on the softer side, discontinue all antihypertensive medication except Lasix.  Diet: Carb modified, fluid restricted to 1.5 L. DVT Prophylaxis: subcutaneous Heparin  Advance goals of care discussion: full code  Family Communication: no family was present at bedside, at the time of interview.  Disposition:  Discharge to SNF.  Consultants: nephrology, oncology, IR Procedures: liver biopsy  Antibiotics: Anti-infectives (From admission, onward)   Start     Dose/Rate Route Frequency Ordered Stop   09/29/17 1500  demeclocycline (DECLOMYCIN) tablet 300 mg     300 mg Oral Every 12 hours 09/29/17 1138         Objective: Physical Exam: Vitals:   10/05/17 0800 10/05/17 0921 10/05/17 0931 10/05/17 1209  BP:  (!) 126/52  120/63  Pulse: 85 90  90  Resp: 17 20  20   Temp: 97.6 F (36.4 C) 98.6 F (37 C)  97.6 F (36.4 C)  TempSrc: Oral Axillary  Oral  SpO2: 95% 91% 94% 98%  Weight:      Height:        Intake/Output Summary (Last 24 hours) at 10/05/2017 1212 Last data filed at 10/05/2017 1100 Gross per 24 hour  Intake 1403 ml  Output 4350 ml  Net -2947 ml   Filed Weights   09/27/17 1844  Weight: (!) 140.6 kg (310 lb)   General: Alert, Awake and Oriented to Time, Place and Person. Appear in moderate distress, affect appropriate Eyes: PERRL, Conjunctiva normal ENT: Oral  Mucosa clear moist. Neck: difficult to assess  JVD, no Abnormal Mass Or lumps Cardiovascular: S1 and S2 Present, no Murmur, Peripheral Pulses Present Respiratory: normal respiratory effort, Bilateral Air entry equal and Decreased, no use of accessory  muscle, basal Crackles, no wheezes Abdomen: Bowel Sound present, Soft and no tenderness, no hernia Skin: on redness, no Rash, no induration Extremities: no Pedal edema, no calf tenderness Neurologic: Grossly no focal neuro deficit. Bilaterally Equal motor strength  Data Reviewed: CBC: Recent Labs  Lab 10/04/17 0319 10/05/17 0802  WBC 9.6 9.5  NEUTROABS 7.4  --   HGB 14.6 14.1  HCT 39.6 39.0  MCV 86.1 88.8  PLT 155 599   Basic Metabolic Panel: Recent Labs  Lab 09/29/17 0156  10/02/17 2339 10/03/17 0324 10/03/17 1602 10/04/17 0319 10/05/17 0802  NA 116*   < > 124* 124* 126* 126* 131*  K 5.0   < > 5.2* 4.6 4.7 4.7 4.8  CL 82*   < > 82* 83* 83* 83* 86*  CO2 25   < > 30 30 30  32 33*  GLUCOSE 149*   < > 176* 159* 186* 200* 210*  BUN 11   < > 48* 48* 52* 53* 53*  CREATININE 0.36*   < > 1.06 1.12 1.13 1.30* 0.98  CALCIUM 8.1*   < > 8.8* 8.6* 8.9 8.8* 8.9  MG 1.7  --   --   --   --  2.3  --   PHOS 2.6  --   --   --   --   --   --    < > = values in this interval not displayed.    Liver Function Tests: Recent Labs  Lab 10/04/17 0319 10/05/17 0802  AST 132* 106*  ALT 140* 126*  ALKPHOS 219* 223*  BILITOT 12.7* 12.3*  PROT 6.5 6.3*  ALBUMIN 2.4* 2.5*   No results for input(s): LIPASE, AMYLASE in the last 168 hours. No results for input(s): AMMONIA in the last 168 hours. Coagulation Profile: No results for input(s): INR, PROTIME in the last 168 hours. Cardiac Enzymes: No results for input(s): CKTOTAL, CKMB, CKMBINDEX, TROPONINI in the last 168 hours. BNP (last 3 results) No results for input(s): PROBNP in the last 8760 hours. CBG: Recent Labs  Lab 10/04/17 0750 10/04/17 1217 10/04/17 1650 10/04/17 2120 10/05/17 0824  GLUCAP 141* 311* 330* 289* 192*   Studies: No results found.  Scheduled Meds: . demeclocycline  300 mg Oral Q12H  . dextromethorphan-guaiFENesin  1 tablet Oral BID  . etoposide  50 mg/m2 (Treatment Plan Recorded) Intravenous Once  .  famotidine  10 mg Oral Daily  . feeding supplement (GLUCERNA SHAKE)  237 mL Oral BID BM  . furosemide  160 mg Oral BID  . HYDROcodone-acetaminophen  10 mL Oral BID  . insulin aspart  0-15 Units Subcutaneous TID WC  . insulin aspart  0-5 Units Subcutaneous QHS  . ipratropium-albuterol  3 mL Nebulization TID  . mouth rinse  15 mL Mouth Rinse BID  . nicotine  21 mg Transdermal Daily  . omega-3 acid ethyl esters  1 g Oral BID  . polyethylene glycol  17 g Oral Daily  . senna-docusate  1 tablet Oral BID   Continuous Infusions: . sodium chloride    . dexamethasone (DECADRON) IVPB [facility]     PRN Meds: albuterol, alteplase, bisacodyl, heparin lock flush, prochlorperazine, sodium chloride flush, sodium chloride flush, traMADol  Time spent: 35 minutes  Author: Berle Mull, MD  [facility] Pager: [phone] 10/05/2017 12:12 PM  If 7PM-7AM, please contact night-coverage at [email or web address], password [facility]

## 2017-10-05 NOTE — Progress Notes (Signed)
Chemotherapy dosage and calculations checked and reviewed with Nancy Marus RN.

## 2017-10-05 NOTE — Progress Notes (Signed)
Robert Compton   HEMATOLOGY/ONCOLOGY INPATIENT PROGRESS NOTE  Date of Service: 10/05/2017  Inpatient Attending: .Lavina Hamman, MD   SUBJECTIVE  Patient notes that he feels overall better today. Sodium levels improved. No nausea/vomiting/diarrhea related to C1D1 carboplatin/etoposide yesterday. No other acute new symptoms overnight. He is glad to be out of the MICU Notes that he is feeling somewhat constipated. Passing flatus.  OBJECTIVE:  NAD  PHYSICAL EXAMINATION: . Vitals:   10/05/17 0600 10/05/17 0800 10/05/17 0921 10/05/17 0931  BP:   (!) 126/52   Pulse: 80 85 90   Resp: 16 17 20    Temp:  97.6 F (36.4 C)    TempSrc:  Oral    SpO2: 94% 95% 91% 94%  Weight:      Height:       Filed Weights   09/27/17 1844  Weight: (!) 310 lb (140.6 kg)   .Body mass index is 38.75 kg/m.  GENERAL:alert, in no acute distress and comfortable SKIN: icteric skin  EYES: icteric sclerae OROPHARYNX:no exudate, no erythema and lips, buccal mucosa, and tongue normal  NECK: supple, no JVD, thyroid normal size, non-tender, without nodularity LYMPH:  no palpable lymphadenopathy in the cervical, axillary or inguinal LUNGS: coarse breath sounds , scattered rhonci HEART: regular rate & rhythm ABDOMEN: abdomen mildly distended, mild TTP in the upper abdomen. Musculoskeletal: 1+ pedal edema PSYCH: alert & oriented x 3 with fluent speech NEURO: no focal motor/sensory deficits  MEDICAL HISTORY:  Past Medical History:  Diagnosis Date  . Arthritis   . Cancer ([place])   . Diabetes mellitus without complication ([place])   . Hypertension     SURGICAL HISTORY: Past Surgical History:  Procedure Laterality Date  . JOINT REPLACEMENT Right    knee    SOCIAL HISTORY: Social History   Socioeconomic History  . Marital status: Single    Spouse name: Not on file  . Number of children: Not on file  . Years of education: Not on file  . Highest education level: Not on file  Occupational History  .  Occupation: [employer] operational officer  . Financial resource strain: Not on file  . Food insecurity:    Worry: Not on file    Inability: Not on file  . Transportation needs:    Medical: Not on file    Non-medical: Not on file  Tobacco Use  . Smoking status: Current Every Day Smoker    Packs/day: 1.50    Years: 8.00    Pack years: 12.00    Types: Cigarettes  . Smokeless tobacco: Never Used  Substance and Sexual Activity  . Alcohol use: No  . Drug use: No  . Sexual activity: Not on file  Lifestyle  . Physical activity:    Days per week: Not on file    Minutes per session: Not on file  . Stress: Not on file  Relationships  . Social connections:    Talks on phone: Not on file    Gets together: Not on file    Attends religious service: Not on file    Active member of club or organization: Not on file    Attends meetings of clubs or organizations: Not on file    Relationship status: Not on file  . Intimate partner violence:    Fear of current or ex partner: Not on file    Emotionally abused: Not on file    Physically abused: Not on file    Forced sexual activity: Not on file  Other Topics Concern  . Not on file  Social History Narrative  . Not on file    FAMILY HISTORY: Family History  Problem Relation Age of Onset  . Diabetes Mother   . Colon cancer Father   . CAD Neg Hx     ALLERGIES:  is allergic to amoxicillin-pot clavulanate and nitrofurantoin.  MEDICATIONS:  Scheduled Meds: . demeclocycline  300 mg Oral Q12H  . dextromethorphan-guaiFENesin  1 tablet Oral BID  . etoposide  50 mg/m2 (Treatment Plan Recorded) Intravenous Once  . famotidine  10 mg Oral Daily  . feeding supplement (GLUCERNA SHAKE)  237 mL Oral BID BM  . furosemide  160 mg Oral BID  . HYDROcodone-acetaminophen  10 mL Oral BID  . insulin aspart  0-15 Units Subcutaneous TID WC  . insulin aspart  0-5 Units Subcutaneous QHS  . ipratropium-albuterol  3 mL Nebulization TID  . mouth rinse  15 mL  Mouth Rinse BID  . nicotine  21 mg Transdermal Daily  . omega-3 acid ethyl esters  1 g Oral BID  . polyethylene glycol  17 g Oral Daily  . senna-docusate  1 tablet Oral BID   Continuous Infusions: . sodium chloride    . dexamethasone (DECADRON) IVPB [facility]     PRN Meds:.albuterol, alteplase, bisacodyl, heparin lock flush, prochlorperazine, sodium chloride flush, sodium chloride flush, traMADol  REVIEW OF SYSTEMS:    10 Point review of Systems was done is negative except as noted above.   LABORATORY DATA:  I have reviewed the data as listed  . CBC Latest Ref Rng & Units 10/05/2017 10/04/2017 09/28/2017  WBC 4.0 - 10.5 K/uL 9.5 9.6 9.5  Hemoglobin 13.0 - 17.0 g/dL 14.1 14.6 14.7  Hematocrit 39.0 - 52.0 % 39.0 39.6 41.6  Platelets 150 - 400 K/uL 154 155 127(L)    . CMP Latest Ref Rng & Units 10/05/2017 10/04/2017 10/03/2017  Glucose 65 - 99 mg/dL 210(H) 200(H) 186(H)  BUN 6 - 20 mg/dL 53(H) 53(H) 52(H)  Creatinine 0.61 - 1.24 mg/dL 0.98 1.30(H) 1.13  Sodium 135 - 145 mmol/L 131(L) 126(L) 126(L)  Potassium 3.5 - 5.1 mmol/L 4.8 4.7 4.7  Chloride 101 - 111 mmol/L 86(L) 83(L) 83(L)  CO2 22 - 32 mmol/L 33(H) 32 30  Calcium 8.9 - 10.3 mg/dL 8.9 8.8(L) 8.9  Total Protein 6.5 - 8.1 g/dL 6.3(L) 6.5 -  Total Bilirubin 0.3 - 1.2 mg/dL 12.3(H) 12.7(H) -  Alkaline Phos 38 - 126 U/L 223(H) 219(H) -  AST 15 - 41 U/L 106(H) 132(H) -  ALT 17 - 63 U/L 126(H) 140(H) -     RADIOGRAPHIC STUDIES: I have personally reviewed the radiological images as listed and agreed with the findings in the report. Ct Chest W Contrast  Result Date: 09/23/2017 CLINICAL DATA:  Evaluate for primary. Liver mass seen on CT abdomen and pelvis without contrast. EXAM: CT CHEST, ABDOMEN, AND PELVIS WITH CONTRAST TECHNIQUE: Multidetector CT imaging of the chest, abdomen and pelvis was performed following the standard protocol during bolus administration of intravenous contrast. CONTRAST:  153mL ISOVUE-300 IOPAMIDOL  (ISOVUE-300) INJECTION 61% COMPARISON:  CT abdomen dated 09/20/2017. FINDINGS: CT CHEST FINDINGS Cardiovascular: Heart size is normal. No pericardial effusion. No thoracic aortic aneurysm. Scattered aortic atherosclerosis. Mediastinum/Nodes: Conglomerate lymphadenopathy within the RIGHT lower paratracheal space, largest component measuring 4.4 x 3.3 cm, with scattered satellite lymph nodes extending from the RIGHT upper paratracheal space to the subcarinal space which are also likely indicative of metastatic disease. Esophagus appears normal.  Trachea and central bronchi are unremarkable. Lungs/Pleura: Irregular mass within the RIGHT lung apex, measuring 3 cm craniocaudal dimension (coronal series 3, image 107) and 2 cm AP dimension (axial series 6, image 27). Two additional subpleural nodules are seen adjacent to the RIGHT minor fissure, measuring 10 mm and 8 mm (axial series 6, images 75 and 76 respectively). LEFT lung is clear.  No pleural effusion. Musculoskeletal: No acute or suspicious osseous findings. No convincing evidence of osseous metastasis. CT ABDOMEN PELVIS FINDINGS Hepatobiliary: Multiple masses within the RIGHT liver lobe, segment 6, 3 separate lesions with largest measuring 2.7 cm. Additional 1.4 cm lesion at the inferior aspects of the RIGHT liver lobe cyst, likely segment 5. Gallbladder appears normal.  No bile duct dilatation. Pancreas: Unremarkable. No pancreatic ductal dilatation or surrounding inflammatory changes. Spleen: Normal in size without focal abnormality. Adrenals/Urinary Tract: LEFT renal cyst measures 4.4 cm. Both kidneys appear otherwise normal without suspicious mass, stone or hydronephrosis. Bladder appears normal. Stomach/Bowel: Bowel is normal in caliber. No bowel wall thickening or evidence of bowel wall inflammation seen. Stomach appears normal. Vascular/Lymphatic: No enlarged lymph nodes appreciated within the abdomen or pelvis. Aortic atherosclerosis. Reproductive: Prostate  is unremarkable. Other: No free fluid or abscess collection. No soft tissue mass within the abdomen or pelvis. Musculoskeletal: No acute or suspicious osseous finding. No convincing evidence of osseous metastasis. IMPRESSION: 1. Multiple hypoenhancing masses within the RIGHT liver lobe, segments 5 and 6, largest lesion measuring 2.7 cm within segment 6, all consistent with hepatic metastases. 2. Conglomerate mediastinal lymphadenopathy in the RIGHT paratracheal space, largest component within the lower RIGHT paratracheal space measuring 4.4 x 3.3 cm, with some degree of central necrosis, consistent with metastatic lymphadenopathy. 3. Irregular mass within the RIGHT lung apex, measuring 3 cm greatest dimension, suspicious for primary lung cancer, alternatively additional metastasis. 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 4. Additional chronic/incidental findings detailed above. The 2 subpleural pulmonary nodules within the RIGHT lower lobe could be benign or additional metastases, favor unrelated chronic/benign nodules. Electronically Signed   By: Franki Cabot M.D.   On: 09/23/2017 16:38   Ct Abdomen Pelvis W Contrast  Result Date: 09/23/2017 CLINICAL DATA:  Evaluate for primary. Liver mass seen on CT abdomen and pelvis without contrast. EXAM: CT CHEST, ABDOMEN, AND PELVIS WITH CONTRAST TECHNIQUE: Multidetector CT imaging of the chest, abdomen and pelvis was performed following the standard protocol during bolus administration of intravenous contrast. CONTRAST:  161mL ISOVUE-300 IOPAMIDOL (ISOVUE-300) INJECTION 61% COMPARISON:  CT abdomen dated 09/20/2017. FINDINGS: CT CHEST FINDINGS Cardiovascular: Heart size is normal. No pericardial effusion. No thoracic aortic aneurysm. Scattered aortic atherosclerosis. Mediastinum/Nodes: Conglomerate lymphadenopathy within the RIGHT lower paratracheal space, largest component measuring 4.4 x 3.3 cm, with scattered  satellite lymph nodes extending from the RIGHT upper paratracheal space to the subcarinal space which are also likely indicative of metastatic disease. Esophagus appears normal. Trachea and central bronchi are unremarkable. Lungs/Pleura: Irregular mass within the RIGHT lung apex, measuring 3 cm craniocaudal dimension (coronal series 3, image 107) and 2 cm AP dimension (axial series 6, image 27). Two additional subpleural nodules are seen adjacent to the RIGHT minor fissure, measuring 10 mm and 8 mm (axial series 6, images 75 and 76 respectively). LEFT lung is clear.  No pleural effusion. Musculoskeletal: No acute or suspicious osseous findings. No convincing evidence of osseous metastasis. CT ABDOMEN PELVIS FINDINGS Hepatobiliary: Multiple masses within the RIGHT liver lobe, segment 6, 3 separate lesions  with largest measuring 2.7 cm. Additional 1.4 cm lesion at the inferior aspects of the RIGHT liver lobe cyst, likely segment 5. Gallbladder appears normal.  No bile duct dilatation. Pancreas: Unremarkable. No pancreatic ductal dilatation or surrounding inflammatory changes. Spleen: Normal in size without focal abnormality. Adrenals/Urinary Tract: LEFT renal cyst measures 4.4 cm. Both kidneys appear otherwise normal without suspicious mass, stone or hydronephrosis. Bladder appears normal. Stomach/Bowel: Bowel is normal in caliber. No bowel wall thickening or evidence of bowel wall inflammation seen. Stomach appears normal. Vascular/Lymphatic: No enlarged lymph nodes appreciated within the abdomen or pelvis. Aortic atherosclerosis. Reproductive: Prostate is unremarkable. Other: No free fluid or abscess collection. No soft tissue mass within the abdomen or pelvis. Musculoskeletal: No acute or suspicious osseous finding. No convincing evidence of osseous metastasis. IMPRESSION: 1. Multiple hypoenhancing masses within the RIGHT liver lobe, segments 5 and 6, largest lesion measuring 2.7 cm within segment 6, all consistent  with hepatic metastases. 2. Conglomerate mediastinal lymphadenopathy in the RIGHT paratracheal space, largest component within the lower RIGHT paratracheal space measuring 4.4 x 3.3 cm, with some degree of central necrosis, consistent with metastatic lymphadenopathy. 3. Irregular mass within the RIGHT lung apex, measuring 3 cm greatest dimension, suspicious for primary lung cancer, alternatively additional metastasis. 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 4. Additional chronic/incidental findings detailed above. The 2 subpleural pulmonary nodules within the RIGHT lower lobe could be benign or additional metastases, favor unrelated chronic/benign nodules. Electronically Signed   By: Franki Cabot M.D.   On: 09/23/2017 16:38   US Biopsy (liver)  Result Date: 09/30/2017 INDICATION: Concern for metastatic lung cancer. Please perform ultrasound-guided liver lesion biopsy for tissue diagnostic purposes. EXAM: ULTRASOUND GUIDED LIVER LESION BIOPSY COMPARISON:  CT the chest, abdomen and pelvis - 09/23/2017; CT abdomen pelvis - 09/20/2017 MEDICATIONS: None ANESTHESIA/SEDATION: Fentanyl 50 mcg IV; Versed 1 mg IV Total Moderate Sedation time:  12 Minutes. The patient's level of consciousness and vital signs were monitored continuously by radiology nursing throughout the procedure under my direct supervision. COMPLICATIONS: None immediate. PROCEDURE: Informed written consent was obtained from the patient after a discussion of the risks, benefits and alternatives to treatment. The patient understands and consents the procedure. A timeout was performed prior to the initiation of the procedure. Ultrasound scanning was performed of the right upper abdominal quadrant demonstrates an approximately 3.2 by 2.3 cm mass within the right lobe of the liver (image 25) compatible with ill-defined mass seen on preceding abdominal CT performed 09/20/2017 on image 30, series 2.  The procedure was planned. The right upper abdominal quadrant was prepped and draped in the usual sterile fashion. The overlying soft tissues were anesthetized with 1% lidocaine with epinephrine. A 17 gauge, 6.8 cm co-axial needle was advanced into a peripheral aspect of the lesion. This was followed by 5 core biopsies with an 18 gauge core device under direct ultrasound guidance. The coaxial needle tract was embolized with a small amount of Gel-Foam slurry and superficial hemostasis was obtained with manual compression. Post procedural scanning was negative for definitive area of hemorrhage or additional complication. A dressing was placed. The patient tolerated the procedure well without immediate post procedural complication. IMPRESSION: Technically successful ultrasound guided core needle biopsy of indeterminate mass within the right lobe of the liver. Electronically Signed   By: Sandi Mariscal M.D.   On: 09/30/2017 17:26   Dg Chest Port 1 View  Result Date: 10/04/2017 CLINICAL DATA:  Shortness of breath  EXAM: PORTABLE CHEST 1 VIEW COMPARISON:  Portable exam 0936 hours compared to 09/27/2017 FINDINGS: Upper normal heart size. Atherosclerotic calcification aorta. Mediastinal contours and pulmonary vascularity normal. Lungs clear. No pleural effusion or pneumothorax. No acute osseous findings. IMPRESSION: No acute abnormalities. Electronically Signed   By: Lavonia Dana M.D.   On: 10/04/2017 11:38   Dg Chest Port 1 View  Result Date: 09/27/2017 CLINICAL DATA:  Weakness. EXAM: PORTABLE CHEST 1 VIEW COMPARISON:  None. FINDINGS: The heart size is borderline to mildly enlarged. The hila, mediastinum, lungs, and pleura are unremarkable. IMPRESSION: No active disease. Electronically Signed   By: Dorise Bullion III M.D   On: 09/27/2017 19:49   Ct Renal Stone Study  Result Date: 09/20/2017 CLINICAL DATA:  Acute onset of right-sided abdominal pain and hematuria. EXAM: CT ABDOMEN AND PELVIS WITHOUT CONTRAST  TECHNIQUE: Multidetector CT imaging of the abdomen and pelvis was performed following the standard protocol without IV contrast. COMPARISON:  Abdominal ultrasound performed 08/04/2003 FINDINGS: Lower chest: Minimal atelectasis is noted at the lung bases. Scattered coronary artery calcifications are seen. Hepatobiliary: Multiple vague masses are seen within both hepatic lobes, measuring up to 3 cm in size, suspicious for metastatic disease to the liver. The gallbladder is decompressed and grossly unremarkable. The common bile duct is normal in caliber. Pancreas: The pancreas is within normal limits. Spleen: The spleen is unremarkable in appearance. Adrenals/Urinary Tract: The adrenal glands are unremarkable. A left renal cyst is noted. The kidneys are otherwise unremarkable in appearance. There is no evidence of hydronephrosis. No renal or ureteral stones are identified. No significant perinephric stranding is seen. Stomach/Bowel: The stomach is unremarkable in appearance. The small bowel is within normal limits. The appendix is not visualized; there is no evidence for appendicitis. The colon is unremarkable in appearance. Vascular/Lymphatic: Scattered calcification is seen along the abdominal aorta and its branches. The abdominal aorta is otherwise grossly unremarkable. The inferior vena cava is grossly unremarkable. No retroperitoneal lymphadenopathy is seen. No pelvic sidewall lymphadenopathy is identified. Reproductive: The bladder is mildly distended and grossly unremarkable. The prostate remains normal in size. Other: No additional soft tissue abnormalities are seen. Musculoskeletal: No acute osseous abnormalities are identified. Vacuum phenomenon is noted at T11-T12. The visualized musculature is unremarkable in appearance. IMPRESSION: 1. Multiple vague masses within both hepatic lobes, measuring up to 3 cm in size, suspicious for metastatic disease to the liver. Would correlate with LFTs. PET/CT is  recommended for further evaluation, to assess for underlying primary malignancy. 2. Scattered coronary artery calcifications seen. 3. Left renal cyst noted. Aortic Atherosclerosis (ICD10-I70.0). Electronically Signed   By: Garald Balding M.D.   On: 09/20/2017 22:46    ASSESSMENT & PLAN:   72 yo with   1) Newly diagnosed Extensive stage small cell lung cancer  Multiple hypoenhancing masses within the RIGHT liver lobe, segments 5 and 6, largest lesion measuring 2.7 cm within segment 6, all consistent with hepatic metastases. 2. Conglomerate mediastinal lymphadenopathy in the RIGHT paratracheal space, largest component within the lower RIGHT paratracheal space measuring 4.4 x 3.3 cm, with some degree of central necrosis, consistent with metastatic lymphadenopathy. 3. Irregular mass within the RIGHT lung apex, measuring 3 cm greatest dimension, suspicious for primary lung cancer, alternatively additional metastasis.  2. Abnormal Liver function tests - due to liver metastases  Slightly improved. Does not endorse prior history of liver disease.  3. Hyponatremia - improving Na 126--->131. ? SIADH related to small cell lung cancer with lung involvement. -nephrology following  PLAN -tolerated C1D1 yesterday well without any prohibitive toxicities -labs stable, LFTs and sodium improving -appropriate to proceed with C1D2 of treatment today (50% dose reduction on Etoposide due to abnormal LFTs) -daily cbc, cmp -udenyca ordered for D5 (if outpatient). If inpatient will need to switch to granix dailyl x 5 days. -consider MRI brain and without contrast to complete staging to evaluate for brain mets -Will followup tomorrow. -patient will f/u with Dr Earlie Server from Monday and on discharge. -nutritional consultation -PT/OT evaluation  4. Constipation -- magnesium citrate x 1  I spent 25 minutes counseling the patient face to face. The total time spent in the appointment was 35 minutes and more than 50% was  on counseling and direct patient cares.    Sullivan Lone MD [place] AAHIVMS [facility] Hematology/Oncology Physician [facility]  (Office):       [phone] (Work cell):  [phone] (Fax):           [phone]  10/05/2017 10:55 AM

## 2017-10-06 DIAGNOSIS — E1165 Type 2 diabetes mellitus with hyperglycemia: Secondary | ICD-10-CM

## 2017-10-06 DIAGNOSIS — R945 Abnormal results of liver function studies: Secondary | ICD-10-CM

## 2017-10-06 DIAGNOSIS — C3490 Malignant neoplasm of unspecified part of unspecified bronchus or lung: Secondary | ICD-10-CM

## 2017-10-06 DIAGNOSIS — K5900 Constipation, unspecified: Secondary | ICD-10-CM

## 2017-10-06 DIAGNOSIS — Z881 Allergy status to other antibiotic agents status: Secondary | ICD-10-CM

## 2017-10-06 LAB — GLUCOSE, CAPILLARY
GLUCOSE-CAPILLARY: 325 mg/dL — AB (ref 65–99)
GLUCOSE-CAPILLARY: 434 mg/dL — AB (ref 65–99)
Glucose-Capillary: 335 mg/dL — ABNORMAL HIGH (ref 65–99)

## 2017-10-06 LAB — COMPREHENSIVE METABOLIC PANEL
ALT: 118 U/L — ABNORMAL HIGH (ref 17–63)
AST: 99 U/L — ABNORMAL HIGH (ref 15–41)
Albumin: 2.2 g/dL — ABNORMAL LOW (ref 3.5–5.0)
Alkaline Phosphatase: 248 U/L — ABNORMAL HIGH (ref 38–126)
Anion gap: 11 (ref 5–15)
BILIRUBIN TOTAL: 11.9 mg/dL — AB (ref 0.3–1.2)
BUN: 57 mg/dL — ABNORMAL HIGH (ref 6–20)
CALCIUM: 8.5 mg/dL — AB (ref 8.9–10.3)
CO2: 34 mmol/L — ABNORMAL HIGH (ref 22–32)
Chloride: 84 mmol/L — ABNORMAL LOW (ref 101–111)
Creatinine, Ser: 1.03 mg/dL (ref 0.61–1.24)
GFR calc Af Amer: >60 mL/min (ref >60)
GLUCOSE: 373 mg/dL — AB (ref 65–99)
POTASSIUM: 5.3 mmol/L — AB (ref 3.5–5.1)
Sodium: 129 mmol/L — ABNORMAL LOW (ref 135–145)
TOTAL PROTEIN: 6 g/dL — AB (ref 6.5–8.1)

## 2017-10-06 LAB — CBC
HEMATOCRIT: 38.3 % — AB (ref 39.0–52.0)
Hemoglobin: 13.6 g/dL (ref 13.0–17.0)
MCH: 31.6 pg (ref 26.0–34.0)
MCHC: 35.5 g/dL (ref 30.0–36.0)
MCV: 88.9 fL (ref 78.0–100.0)
PLATELETS: 146 10*3/uL — AB (ref 150–400)
RBC: 4.31 MIL/uL (ref 4.22–5.81)
RDW: 14.8 % (ref 11.5–15.5)
WBC: 7.7 10*3/uL (ref 4.0–10.5)

## 2017-10-06 MED ORDER — INSULIN ASPART 100 UNIT/ML ~~LOC~~ SOLN
10.0000 [IU] | Freq: Once | SUBCUTANEOUS | Status: AC
  Administered 2017-10-06: 10 [IU] via SUBCUTANEOUS

## 2017-10-06 MED ORDER — SODIUM CHLORIDE 0.9 % IV SOLN
50.0000 mg/m2 | Freq: Once | INTRAVENOUS | Status: AC
  Administered 2017-10-06: 140 mg via INTRAVENOUS
  Filled 2017-10-06 (×2): qty 7

## 2017-10-06 MED ORDER — INSULIN GLARGINE 100 UNIT/ML ~~LOC~~ SOLN
8.0000 [IU] | Freq: Every day | SUBCUTANEOUS | Status: DC
  Administered 2017-10-06 – 2017-10-08 (×3): 8 [IU] via SUBCUTANEOUS
  Filled 2017-10-06 (×3): qty 0.08

## 2017-10-06 MED ORDER — SENNOSIDES-DOCUSATE SODIUM 8.6-50 MG PO TABS: 1.0000 | ORAL_TABLET | Freq: Every evening | ORAL | Status: DC | PRN

## 2017-10-06 MED ORDER — DEXAMETHASONE SODIUM PHOSPHATE 100 MG/10ML IJ SOLN
10.0000 mg | Freq: Once | INTRAMUSCULAR | Status: AC
  Administered 2017-10-06: 10 mg via INTRAVENOUS
  Filled 2017-10-06: qty 1

## 2017-10-06 MED ORDER — ZOLPIDEM TARTRATE 5 MG PO TABS: 5.0000 mg | ORAL_TABLET | Freq: Every evening | ORAL | Status: DC | PRN

## 2017-10-06 MED ORDER — SODIUM CHLORIDE 0.9 % IV SOLN
Freq: Once | INTRAVENOUS | Status: AC
  Administered 2017-10-06: 13:00:00 via INTRAVENOUS

## 2017-10-06 NOTE — Progress Notes (Signed)
Spoke with pt's daughter [name] to provide SNF bed offers- she states she will begin reviewing to make selections.  Sharren Bridge, MSW, LCSW Clinical Social Work 10/06/2017  [phone] weekend coverage for [phone]

## 2017-10-06 NOTE — Progress Notes (Signed)
Robert Compton   HEMATOLOGY/ONCOLOGY INPATIENT PROGRESS NOTE  Date of Service: 10/06/2017  Inpatient Attending: .Lavina Hamman, MD   SUBJECTIVE  Patient notes he had good bowel movements and notes some improved appetite. No nausea. Blood sugars uncontrolled in 300's. LFTs stable/improving. No other acute new symptoms noted. Still feels very fatigued.   OBJECTIVE:  NAD  PHYSICAL EXAMINATION: . Vitals:   10/05/17 2053 10/05/17 2122 10/06/17 0518 10/06/17 0849  BP: (!) 141/78  130/67   Pulse: 88  91   Resp: 16  18   Temp: 97.8 F (36.6 C)  97.9 F (36.6 C)   TempSrc: Oral  Oral   SpO2: 97% 94% 96% 92%  Weight:      Height:       Filed Weights   09/27/17 1844 10/05/17 1209  Weight: (!) 310 lb (140.6 kg) (!) 303 lb 12.8 oz (137.8 kg)   .Body mass index is 37.97 kg/m.  GENERAL:alert, in no acute distress and comfortable SKIN: icteric skin  EYES: icteric sclerae OROPHARYNX:no exudate, no erythema and lips, buccal mucosa, and tongue normal  NECK: supple, no JVD, thyroid normal size, non-tender, without nodularity LYMPH:  no palpable lymphadenopathy in the cervical, axillary or inguinal LUNGS: coarse breath sounds , scattered rhonci HEART: regular rate & rhythm ABDOMEN: abdomen mildly distended, mild TTP in the upper abdomen. Musculoskeletal: 1+ pedal edema PSYCH: alert & oriented x 3 with fluent speech NEURO: no focal motor/sensory deficits  MEDICAL HISTORY:  Past Medical History:  Diagnosis Date  . Arthritis   . Cancer ([place])   . Diabetes mellitus without complication ([place])   . Hypertension     SURGICAL HISTORY: Past Surgical History:  Procedure Laterality Date  . JOINT REPLACEMENT Right    knee    SOCIAL HISTORY: Social History   Socioeconomic History  . Marital status: Single    Spouse name: Not on file  . Number of children: Not on file  . Years of education: Not on file  . Highest education level: Not on file  Occupational History  . Occupation: Truck  [employer] operational officer  . Financial resource strain: Not on file  . Food insecurity:    Worry: Not on file    Inability: Not on file  . Transportation needs:    Medical: Not on file    Non-medical: Not on file  Tobacco Use  . Smoking status: Current Every Day Smoker    Packs/day: 1.50    Years: 8.00    Pack years: 12.00    Types: Cigarettes  . Smokeless tobacco: Never Used  Substance and Sexual Activity  . Alcohol use: No  . Drug use: No  . Sexual activity: Not on file  Lifestyle  . Physical activity:    Days per week: Not on file    Minutes per session: Not on file  . Stress: Not on file  Relationships  . Social connections:    Talks on phone: Not on file    Gets together: Not on file    Attends religious service: Not on file    Active member of club or organization: Not on file    Attends meetings of clubs or organizations: Not on file    Relationship status: Not on file  . Intimate partner violence:    Fear of current or ex partner: Not on file    Emotionally abused: Not on file    Physically abused: Not on file    Forced sexual activity: Not on  file  Other Topics Concern  . Not on file  Social History Narrative  . Not on file    FAMILY HISTORY: Family History  Problem Relation Age of Onset  . Diabetes Mother   . Colon cancer Father   . CAD Neg Hx     ALLERGIES:  is allergic to amoxicillin-pot clavulanate and nitrofurantoin.  MEDICATIONS:  Scheduled Meds: . demeclocycline  300 mg Oral Q12H  . dextromethorphan-guaiFENesin  1 tablet Oral BID  . etoposide  50 mg/m2 (Treatment Plan Recorded) Intravenous Once  . famotidine  10 mg Oral Daily  . feeding supplement (GLUCERNA SHAKE)  237 mL Oral BID BM  . furosemide  40 mg Oral BID  . HYDROcodone-acetaminophen  10 mL Oral BID  . insulin aspart  0-15 Units Subcutaneous TID WC  . insulin aspart  0-5 Units Subcutaneous QHS  . ipratropium-albuterol  3 mL Nebulization TID  . mouth rinse  15 mL Mouth Rinse BID  .  nicotine  21 mg Transdermal Daily  . omega-3 acid ethyl esters  1 g Oral BID  . polyethylene glycol  17 g Oral Daily  . senna-docusate  1 tablet Oral BID   Continuous Infusions: . sodium chloride    . dexamethasone (DECADRON) IVPB [facility]     PRN Meds:.albuterol, alteplase, bisacodyl, heparin lock flush, prochlorperazine, sodium chloride flush, sodium chloride flush, traMADol  REVIEW OF SYSTEMS:    10 Point review of Systems was done is negative except as noted above.   LABORATORY DATA:  I have reviewed the data as listed  . CBC Latest Ref Rng & Units 10/06/2017 10/05/2017 10/04/2017  WBC 4.0 - 10.5 K/uL 7.7 9.5 9.6  Hemoglobin 13.0 - 17.0 g/dL 13.6 14.1 14.6  Hematocrit 39.0 - 52.0 % 38.3(L) 39.0 39.6  Platelets 150 - 400 K/uL 146(L) 154 155    . CMP Latest Ref Rng & Units 10/06/2017 10/05/2017 10/04/2017  Glucose 65 - 99 mg/dL 373(H) 210(H) 200(H)  BUN 6 - 20 mg/dL 57(H) 53(H) 53(H)  Creatinine 0.61 - 1.24 mg/dL 1.03 0.98 1.30(H)  Sodium 135 - 145 mmol/L 129(L) 131(L) 126(L)  Potassium 3.5 - 5.1 mmol/L 5.3(H) 4.8 4.7  Chloride 101 - 111 mmol/L 84(L) 86(L) 83(L)  CO2 22 - 32 mmol/L 34(H) 33(H) 32  Calcium 8.9 - 10.3 mg/dL 8.5(L) 8.9 8.8(L)  Total Protein 6.5 - 8.1 g/dL 6.0(L) 6.3(L) 6.5  Total Bilirubin 0.3 - 1.2 mg/dL 11.9(H) 12.3(H) 12.7(H)  Alkaline Phos 38 - 126 U/L 248(H) 223(H) 219(H)  AST 15 - 41 U/L 99(H) 106(H) 132(H)  ALT 17 - 63 U/L 118(H) 126(H) 140(H)     RADIOGRAPHIC STUDIES: I have personally reviewed the radiological images as listed and agreed with the findings in the report. Ct Chest W Contrast  Result Date: 09/23/2017 CLINICAL DATA:  Evaluate for primary. Liver mass seen on CT abdomen and pelvis without contrast. EXAM: CT CHEST, ABDOMEN, AND PELVIS WITH CONTRAST TECHNIQUE: Multidetector CT imaging of the chest, abdomen and pelvis was performed following the standard protocol during bolus administration of intravenous contrast. CONTRAST:  141mL ISOVUE-300  IOPAMIDOL (ISOVUE-300) INJECTION 61% COMPARISON:  CT abdomen dated 09/20/2017. FINDINGS: CT CHEST FINDINGS Cardiovascular: Heart size is normal. No pericardial effusion. No thoracic aortic aneurysm. Scattered aortic atherosclerosis. Mediastinum/Nodes: Conglomerate lymphadenopathy within the RIGHT lower paratracheal space, largest component measuring 4.4 x 3.3 cm, with scattered satellite lymph nodes extending from the RIGHT upper paratracheal space to the subcarinal space which are also likely indicative of metastatic disease. Esophagus  appears normal. Trachea and central bronchi are unremarkable. Lungs/Pleura: Irregular mass within the RIGHT lung apex, measuring 3 cm craniocaudal dimension (coronal series 3, image 107) and 2 cm AP dimension (axial series 6, image 27). Two additional subpleural nodules are seen adjacent to the RIGHT minor fissure, measuring 10 mm and 8 mm (axial series 6, images 75 and 76 respectively). LEFT lung is clear.  No pleural effusion. Musculoskeletal: No acute or suspicious osseous findings. No convincing evidence of osseous metastasis. CT ABDOMEN PELVIS FINDINGS Hepatobiliary: Multiple masses within the RIGHT liver lobe, segment 6, 3 separate lesions with largest measuring 2.7 cm. Additional 1.4 cm lesion at the inferior aspects of the RIGHT liver lobe cyst, likely segment 5. Gallbladder appears normal.  No bile duct dilatation. Pancreas: Unremarkable. No pancreatic ductal dilatation or surrounding inflammatory changes. Spleen: Normal in size without focal abnormality. Adrenals/Urinary Tract: LEFT renal cyst measures 4.4 cm. Both kidneys appear otherwise normal without suspicious mass, stone or hydronephrosis. Bladder appears normal. Stomach/Bowel: Bowel is normal in caliber. No bowel wall thickening or evidence of bowel wall inflammation seen. Stomach appears normal. Vascular/Lymphatic: No enlarged lymph nodes appreciated within the abdomen or pelvis. Aortic atherosclerosis.  Reproductive: Prostate is unremarkable. Other: No free fluid or abscess collection. No soft tissue mass within the abdomen or pelvis. Musculoskeletal: No acute or suspicious osseous finding. No convincing evidence of osseous metastasis. IMPRESSION: 1. Multiple hypoenhancing masses within the RIGHT liver lobe, segments 5 and 6, largest lesion measuring 2.7 cm within segment 6, all consistent with hepatic metastases. 2. Conglomerate mediastinal lymphadenopathy in the RIGHT paratracheal space, largest component within the lower RIGHT paratracheal space measuring 4.4 x 3.3 cm, with some degree of central necrosis, consistent with metastatic lymphadenopathy. 3. Irregular mass within the RIGHT lung apex, measuring 3 cm greatest dimension, suspicious for primary lung cancer, alternatively additional metastasis. 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 4. Additional chronic/incidental findings detailed above. The 2 subpleural pulmonary nodules within the RIGHT lower lobe could be benign or additional metastases, favor unrelated chronic/benign nodules. Electronically Signed   By: Franki Cabot M.D.   On: 09/23/2017 16:38   Ct Abdomen Pelvis W Contrast  Result Date: 09/23/2017 CLINICAL DATA:  Evaluate for primary. Liver mass seen on CT abdomen and pelvis without contrast. EXAM: CT CHEST, ABDOMEN, AND PELVIS WITH CONTRAST TECHNIQUE: Multidetector CT imaging of the chest, abdomen and pelvis was performed following the standard protocol during bolus administration of intravenous contrast. CONTRAST:  124mL ISOVUE-300 IOPAMIDOL (ISOVUE-300) INJECTION 61% COMPARISON:  CT abdomen dated 09/20/2017. FINDINGS: CT CHEST FINDINGS Cardiovascular: Heart size is normal. No pericardial effusion. No thoracic aortic aneurysm. Scattered aortic atherosclerosis. Mediastinum/Nodes: Conglomerate lymphadenopathy within the RIGHT lower paratracheal space, largest component measuring 4.4 x  3.3 cm, with scattered satellite lymph nodes extending from the RIGHT upper paratracheal space to the subcarinal space which are also likely indicative of metastatic disease. Esophagus appears normal. Trachea and central bronchi are unremarkable. Lungs/Pleura: Irregular mass within the RIGHT lung apex, measuring 3 cm craniocaudal dimension (coronal series 3, image 107) and 2 cm AP dimension (axial series 6, image 27). Two additional subpleural nodules are seen adjacent to the RIGHT minor fissure, measuring 10 mm and 8 mm (axial series 6, images 75 and 76 respectively). LEFT lung is clear.  No pleural effusion. Musculoskeletal: No acute or suspicious osseous findings. No convincing evidence of osseous metastasis. CT ABDOMEN PELVIS FINDINGS Hepatobiliary: Multiple masses within the RIGHT liver lobe, segment 6, 3  separate lesions with largest measuring 2.7 cm. Additional 1.4 cm lesion at the inferior aspects of the RIGHT liver lobe cyst, likely segment 5. Gallbladder appears normal.  No bile duct dilatation. Pancreas: Unremarkable. No pancreatic ductal dilatation or surrounding inflammatory changes. Spleen: Normal in size without focal abnormality. Adrenals/Urinary Tract: LEFT renal cyst measures 4.4 cm. Both kidneys appear otherwise normal without suspicious mass, stone or hydronephrosis. Bladder appears normal. Stomach/Bowel: Bowel is normal in caliber. No bowel wall thickening or evidence of bowel wall inflammation seen. Stomach appears normal. Vascular/Lymphatic: No enlarged lymph nodes appreciated within the abdomen or pelvis. Aortic atherosclerosis. Reproductive: Prostate is unremarkable. Other: No free fluid or abscess collection. No soft tissue mass within the abdomen or pelvis. Musculoskeletal: No acute or suspicious osseous finding. No convincing evidence of osseous metastasis. IMPRESSION: 1. Multiple hypoenhancing masses within the RIGHT liver lobe, segments 5 and 6, largest lesion measuring 2.7 cm within  segment 6, all consistent with hepatic metastases. 2. Conglomerate mediastinal lymphadenopathy in the RIGHT paratracheal space, largest component within the lower RIGHT paratracheal space measuring 4.4 x 3.3 cm, with some degree of central necrosis, consistent with metastatic lymphadenopathy. 3. Irregular mass within the RIGHT lung apex, measuring 3 cm greatest dimension, suspicious for primary lung cancer, alternatively additional metastasis. 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 4. Additional chronic/incidental findings detailed above. The 2 subpleural pulmonary nodules within the RIGHT lower lobe could be benign or additional metastases, favor unrelated chronic/benign nodules. Electronically Signed   By: Franki Cabot M.D.   On: 09/23/2017 16:38   US Biopsy (liver)  Result Date: 09/30/2017 INDICATION: Concern for metastatic lung cancer. Please perform ultrasound-guided liver lesion biopsy for tissue diagnostic purposes. EXAM: ULTRASOUND GUIDED LIVER LESION BIOPSY COMPARISON:  CT the chest, abdomen and pelvis - 09/23/2017; CT abdomen pelvis - 09/20/2017 MEDICATIONS: None ANESTHESIA/SEDATION: Fentanyl 50 mcg IV; Versed 1 mg IV Total Moderate Sedation time:  12 Minutes. The patient's level of consciousness and vital signs were monitored continuously by radiology nursing throughout the procedure under my direct supervision. COMPLICATIONS: None immediate. PROCEDURE: Informed written consent was obtained from the patient after a discussion of the risks, benefits and alternatives to treatment. The patient understands and consents the procedure. A timeout was performed prior to the initiation of the procedure. Ultrasound scanning was performed of the right upper abdominal quadrant demonstrates an approximately 3.2 by 2.3 cm mass within the right lobe of the liver (image 25) compatible with ill-defined mass seen on preceding abdominal CT performed  09/20/2017 on image 30, series 2. The procedure was planned. The right upper abdominal quadrant was prepped and draped in the usual sterile fashion. The overlying soft tissues were anesthetized with 1% lidocaine with epinephrine. A 17 gauge, 6.8 cm co-axial needle was advanced into a peripheral aspect of the lesion. This was followed by 5 core biopsies with an 18 gauge core device under direct ultrasound guidance. The coaxial needle tract was embolized with a small amount of Gel-Foam slurry and superficial hemostasis was obtained with manual compression. Post procedural scanning was negative for definitive area of hemorrhage or additional complication. A dressing was placed. The patient tolerated the procedure well without immediate post procedural complication. IMPRESSION: Technically successful ultrasound guided core needle biopsy of indeterminate mass within the right lobe of the liver. Electronically Signed   By: Sandi Mariscal M.D.   On: 09/30/2017 17:26   Dg Chest Port 1 View  Result Date: 10/04/2017 CLINICAL DATA:  Shortness  of breath EXAM: PORTABLE CHEST 1 VIEW COMPARISON:  Portable exam 0936 hours compared to 09/27/2017 FINDINGS: Upper normal heart size. Atherosclerotic calcification aorta. Mediastinal contours and pulmonary vascularity normal. Lungs clear. No pleural effusion or pneumothorax. No acute osseous findings. IMPRESSION: No acute abnormalities. Electronically Signed   By: Lavonia Dana M.D.   On: 10/04/2017 11:38   Dg Chest Port 1 View  Result Date: 09/27/2017 CLINICAL DATA:  Weakness. EXAM: PORTABLE CHEST 1 VIEW COMPARISON:  None. FINDINGS: The heart size is borderline to mildly enlarged. The hila, mediastinum, lungs, and pleura are unremarkable. IMPRESSION: No active disease. Electronically Signed   By: Dorise Bullion III M.D   On: 09/27/2017 19:49   Ct Renal Stone Study  Result Date: 09/20/2017 CLINICAL DATA:  Acute onset of right-sided abdominal pain and hematuria. EXAM: CT ABDOMEN AND  PELVIS WITHOUT CONTRAST TECHNIQUE: Multidetector CT imaging of the abdomen and pelvis was performed following the standard protocol without IV contrast. COMPARISON:  Abdominal ultrasound performed 08/04/2003 FINDINGS: Lower chest: Minimal atelectasis is noted at the lung bases. Scattered coronary artery calcifications are seen. Hepatobiliary: Multiple vague masses are seen within both hepatic lobes, measuring up to 3 cm in size, suspicious for metastatic disease to the liver. The gallbladder is decompressed and grossly unremarkable. The common bile duct is normal in caliber. Pancreas: The pancreas is within normal limits. Spleen: The spleen is unremarkable in appearance. Adrenals/Urinary Tract: The adrenal glands are unremarkable. A left renal cyst is noted. The kidneys are otherwise unremarkable in appearance. There is no evidence of hydronephrosis. No renal or ureteral stones are identified. No significant perinephric stranding is seen. Stomach/Bowel: The stomach is unremarkable in appearance. The small bowel is within normal limits. The appendix is not visualized; there is no evidence for appendicitis. The colon is unremarkable in appearance. Vascular/Lymphatic: Scattered calcification is seen along the abdominal aorta and its branches. The abdominal aorta is otherwise grossly unremarkable. The inferior vena cava is grossly unremarkable. No retroperitoneal lymphadenopathy is seen. No pelvic sidewall lymphadenopathy is identified. Reproductive: The bladder is mildly distended and grossly unremarkable. The prostate remains normal in size. Other: No additional soft tissue abnormalities are seen. Musculoskeletal: No acute osseous abnormalities are identified. Vacuum phenomenon is noted at T11-T12. The visualized musculature is unremarkable in appearance. IMPRESSION: 1. Multiple vague masses within both hepatic lobes, measuring up to 3 cm in size, suspicious for metastatic disease to the liver. Would correlate with  LFTs. PET/CT is recommended for further evaluation, to assess for underlying primary malignancy. 2. Scattered coronary artery calcifications seen. 3. Left renal cyst noted. Aortic Atherosclerosis (ICD10-I70.0). Electronically Signed   By: Garald Balding M.D.   On: 09/20/2017 22:46    ASSESSMENT & PLAN:   72 yo with   1) Newly diagnosed Extensive stage small cell lung cancer  Multiple hypoenhancing masses within the RIGHT liver lobe, segments 5 and 6, largest lesion measuring 2.7 cm within segment 6, all consistent with hepatic metastases. 2. Conglomerate mediastinal lymphadenopathy in the RIGHT paratracheal space, largest component within the lower RIGHT paratracheal space measuring 4.4 x 3.3 cm, with some degree of central necrosis, consistent with metastatic lymphadenopathy. 3. Irregular mass within the RIGHT lung apex, measuring 3 cm greatest dimension, suspicious for primary lung cancer, alternatively additional metastasis.  2. Abnormal Liver function tests - due to liver metastases  Slightly improved. Does not endorse prior history of liver disease. Stable/improving.  3. Hyponatremia - improving Na 126--->131.-->129 ? SIADH related to small cell lung cancer with lung  involvement. -nephrology following PLAN -tolerated C1D2 yesterday well without any prohibitive toxicities -labs stable, LFTs and sodium stable. -appropriate to proceed with C1D23of treatment today (50% dose reduction on Etoposide due to abnormal LFTs) -daily cbc, cmp -udenyca ordered for D5 (if outpatient). If inpatient will need to switch to granix daily x 5 days starting tomorrow evening. -consider MRI brain and without contrast to complete staging to evaluate for brain mets (weight limitations- causing delays) -given abnormal LFTs -- will need to be mindful of the use of demeclocyline for hyponatremia and take out as as possible. -patient will f/u with Dr Earlie Server from Monday and on discharge. -nutritional  consultation -PT/OT evaluation  4. Constipation -- resolved with good bowel movement since yesterday. On Senna per hospitalist  5. Uncontrolled hyperglycemia with Dm2 - likely higher blood sugars due to steroids. -Mx per hospitalist  Appreciate excellent cares from hospital medicine team    Sullivan Lone MD [place] AAHIVMS [facility] Hematology/Oncology Physician [facility]  (Office):       [phone] (Work cell):  [phone] (Fax):           [phone]  10/06/2017 10:11 AM

## 2017-10-06 NOTE — Progress Notes (Signed)
[facility] Progress Note  Patient: Robert Compton IRW:[id number]   PCP: [name], MD DOB: [birth year]   DOA: 09/27/2017   DOS: 10/06/2017   Date of Service: the patient was seen and examined on 10/06/2017  Subjective: Feels tired today.  No nausea no vomiting.  Denies any pain while swallowing.  Actually had a bowel movement yesterday.  No abdominal pain.  Did not sleep good last night  Brief hospital course: Pt. with PMH of 2 DM, right TKR; admitted on 09/27/2017, presented with complaint of shortness of breath, was found to have natremia secondary to SIADH secondary to newly diagnosed metastatic small cell cancer with liver metastasis.  Started on chemotherapy inpatient.  At present we will continue to monitor. Currently further plan is continue chemotherapy.  Assessment and Plan: 1.  Hyponatremia. Secondary to SIADH. Metastatic small cell lung carcinoma with liver mets. Sodium 129.  Pseudo- Patient was started on demeclocycline and Lasix, continue. Cut lasix from 160 bid to 40 bid for now while he is getting chemo Avapro, Norvasc, diclofenac, chlorpheniramine DC'd Appreciate nephrology assistance. neurologically patient still feels fatigued and tired.  No focal deficit  2.  Metastatic small cell lung carcinoma. Liver metastasis. LFTs are stable, bilirubin trending down Oncology consulted.  Highly appreciate Dr. Worthy Flank assistance in initiating chemotherapy here in the hospital. Patient was explained the intent is palliative only.  3.  Acute hypoxic respiratory failure. Hemoptysis and wheezing. Possible COPD. Former smoker. Hemoptysis is resolved, cough is improved.  Wheezing is also resolved. Patient requiring 2L of oxygen at present this morning. Unremarkable chest x-ray. Provide incentive spirometry. Already on Lasix with negative in and out. Continue nicotine patch. Continue nebulizers as needed. Continue Mucinex.  4.  Diabetes mellitus.  Type II,  uncontrolled with hyperglycemia secondary to steroids Hemoglobin A1c 7.2. Controlled, no complication.,  No long-term insulin use. Holding metformin. Continue insulin sliding scale scheduled Lantus 8 units daily. Glucerna.  5.  GERD Constipation On stool softener.  Monitor.  6.  Essential hypertension. Blood pressure is currently relatively on the softer side, discontinue all antihypertensive medication except Lasix.  7.  Hyperkalemia. Likely due to chemo. We will monitor for now.  8.  Insomnia. PRN Ambien.  Diet: Carb modified, fluid restricted to 1.5 L. DVT Prophylaxis: subcutaneous Heparin  Advance goals of care discussion: full code  Family Communication: no family was present at bedside, at the time of interview.  Disposition:  Discharge to SNF.  Consultants: nephrology, oncology, IR Procedures: liver biopsy  Antibiotics: Anti-infectives (From admission, onward)   Start     Dose/Rate Route Frequency Ordered Stop   09/29/17 1500  demeclocycline (DECLOMYCIN) tablet 300 mg     300 mg Oral Every 12 hours 09/29/17 1138         Objective: Physical Exam: Vitals:   10/05/17 2053 10/05/17 2122 10/06/17 0518 10/06/17 0849  BP: (!) 141/78  130/67   Pulse: 88  91   Resp: 16  18   Temp: 97.8 F (36.6 C)  97.9 F (36.6 C)   TempSrc: Oral  Oral   SpO2: 97% 94% 96% 92%  Weight:      Height:        Intake/Output Summary (Last 24 hours) at 10/06/2017 1319 Last data filed at 10/06/2017 0944 Gross per 24 hour  Intake 960 ml  Output 2200 ml  Net -1240 ml   Filed Weights   09/27/17 1844 10/05/17 1209  Weight: (!) 140.6 kg (310 lb) (!) 137.8 kg (303 lb  12.8 oz)   General: Alert, Awake and Oriented to Time, Place and Person. Appear in moderate distress, affect appropriate Eyes: PERRL, Conjunctiva normal ENT: Oral Mucosa clear moist. Neck: difficult to assess  JVD, no Abnormal Mass Or lumps Cardiovascular: S1 and S2 Present, no Murmur, Peripheral Pulses  Present Respiratory: normal respiratory effort, Bilateral Air entry equal and Decreased, no use of accessory muscle, basal Crackles, no wheezes Abdomen: Bowel Sound present, Soft and no tenderness, no hernia Skin: on redness, no Rash, no induration Extremities: no Pedal edema, no calf tenderness Neurologic: Grossly no focal neuro deficit. Bilaterally Equal motor strength  Data Reviewed: CBC: Recent Labs  Lab 10/04/17 0319 10/05/17 0802 10/06/17 0433  WBC 9.6 9.5 7.7  NEUTROABS 7.4  --   --   HGB 14.6 14.1 13.6  HCT 39.6 39.0 38.3*  MCV 86.1 88.8 88.9  PLT 155 154 622*   Basic Metabolic Panel: Recent Labs  Lab 10/03/17 0324 10/03/17 1602 10/04/17 0319 10/05/17 0802 10/06/17 0433  NA 124* 126* 126* 131* 129*  K 4.6 4.7 4.7 4.8 5.3*  CL 83* 83* 83* 86* 84*  CO2 30 30 32 33* 34*  GLUCOSE 159* 186* 200* 210* 373*  BUN 48* 52* 53* 53* 57*  CREATININE 1.12 1.13 1.30* 0.98 1.03  CALCIUM 8.6* 8.9 8.8* 8.9 8.5*  MG  --   --  2.3  --   --     Liver Function Tests: Recent Labs  Lab 10/04/17 0319 10/05/17 0802 10/06/17 0433  AST 132* 106* 99*  ALT 140* 126* 118*  ALKPHOS 219* 223* 248*  BILITOT 12.7* 12.3* 11.9*  PROT 6.5 6.3* 6.0*  ALBUMIN 2.4* 2.5* 2.2*   CBG: Recent Labs  Lab 10/05/17 1224 10/05/17 1709 10/05/17 2152 10/06/17 0810 10/06/17 1239  GLUCAP 308* 261* 318* 335* 325*   Studies: No results found.  Scheduled Meds: . demeclocycline  300 mg Oral Q12H  . dextromethorphan-guaiFENesin  1 tablet Oral BID  . etoposide  50 mg/m2 (Treatment Plan Recorded) Intravenous Once  . famotidine  10 mg Oral Daily  . feeding supplement (GLUCERNA SHAKE)  237 mL Oral BID BM  . furosemide  40 mg Oral BID  . HYDROcodone-acetaminophen  10 mL Oral BID  . insulin aspart  0-15 Units Subcutaneous TID WC  . insulin aspart  0-5 Units Subcutaneous QHS  . insulin glargine  8 Units Subcutaneous Daily  . ipratropium-albuterol  3 mL Nebulization TID  . mouth rinse  15 mL Mouth  Rinse BID  . nicotine  21 mg Transdermal Daily  . omega-3 acid ethyl esters  1 g Oral BID  . polyethylene glycol  17 g Oral Daily  . senna-docusate  1 tablet Oral BID   Continuous Infusions: . sodium chloride     PRN Meds: albuterol, alteplase, bisacodyl, heparin lock flush, prochlorperazine, sodium chloride flush, sodium chloride flush, traMADol  Time spent: 35 minutes  Author: Berle Mull, MD [facility] Pager: [phone] 10/06/2017 1:19 PM  If 7PM-7AM, please contact night-coverage at [email or web address], password [facility]

## 2017-10-07 DIAGNOSIS — C799 Secondary malignant neoplasm of unspecified site: Secondary | ICD-10-CM

## 2017-10-07 LAB — CBC
HEMATOCRIT: 38.1 % — AB (ref 39.0–52.0)
HEMOGLOBIN: 13.8 g/dL (ref 13.0–17.0)
MCH: 32.2 pg (ref 26.0–34.0)
MCHC: 36.2 g/dL — ABNORMAL HIGH (ref 30.0–36.0)
MCV: 89 fL (ref 78.0–100.0)
Platelets: 144 10*3/uL — ABNORMAL LOW (ref 150–400)
RBC: 4.28 MIL/uL (ref 4.22–5.81)
RDW: 15.1 % (ref 11.5–15.5)
WBC: 7.6 10*3/uL (ref 4.0–10.5)

## 2017-10-07 LAB — GLUCOSE, CAPILLARY
GLUCOSE-CAPILLARY: 294 mg/dL — AB (ref 65–99)
Glucose-Capillary: 294 mg/dL — ABNORMAL HIGH (ref 65–99)
Glucose-Capillary: 368 mg/dL — ABNORMAL HIGH (ref 65–99)
Glucose-Capillary: 192 mg/dL — ABNORMAL HIGH (ref 65–99)

## 2017-10-07 LAB — COMPREHENSIVE METABOLIC PANEL
ALBUMIN: 2.2 g/dL — AB (ref 3.5–5.0)
ALT: 123 U/L — AB (ref 17–63)
AST: 101 U/L — AB (ref 15–41)
Alkaline Phosphatase: 237 U/L — ABNORMAL HIGH (ref 38–126)
Anion gap: 12 (ref 5–15)
BILIRUBIN TOTAL: 11.2 mg/dL — AB (ref 0.3–1.2)
BUN: 56 mg/dL — AB (ref 6–20)
CO2: 29 mmol/L (ref 22–32)
Calcium: 8.4 mg/dL — ABNORMAL LOW (ref 8.9–10.3)
Chloride: 89 mmol/L — ABNORMAL LOW (ref 101–111)
Creatinine, Ser: 1 mg/dL (ref 0.61–1.24)
GFR calc Af Amer: >60 mL/min (ref >60)
GFR calc non Af Amer: >60 mL/min (ref >60)
GLUCOSE: 316 mg/dL — AB (ref 65–99)
POTASSIUM: 5.4 mmol/L — AB (ref 3.5–5.1)
SODIUM: 130 mmol/L — AB (ref 135–145)
TOTAL PROTEIN: 5.7 g/dL — AB (ref 6.5–8.1)

## 2017-10-07 MED ORDER — MAGIC MOUTHWASH W/LIDOCAINE
5.0000 mL | Freq: Four times a day (QID) | ORAL | Status: DC
  Administered 2017-10-07 – 2017-10-08 (×3): 5 mL via ORAL
  Filled 2017-10-07 (×7): qty 5

## 2017-10-07 NOTE — Progress Notes (Addendum)
[facility] Progress Note  Patient: Robert Compton YKZ:[id number]   PCP: [name], MD DOB: 1945-11-01   DOA: 09/27/2017   DOS: 10/07/2017   Date of Service: the patient was seen and examined on 10/07/2017  Subjective: feel better, no acute complains has some pain while swallowing   Brief hospital course: Pt. with PMH of 2 DM, right TKR; admitted on 09/27/2017, presented with complaint of shortness of breath, was found to have natremia secondary to SIADH secondary to newly diagnosed metastatic small cell cancer with liver metastasis.  Started on chemotherapy inpatient.  At present we will continue to monitor. Currently further plan is continue chemotherapy.  Assessment and Plan: 1.  Hyponatremia. Secondary to SIADH. Metastatic small cell lung carcinoma with liver mets. Sodium 129.  Pseudo- Patient was started on demeclocycline and Lasix, continue. Cut lasix from 160 bid to 40 bid for now while he is getting chemo Avapro, Norvasc, diclofenac, chlorpheniramine DC'd Appreciate nephrology assistance.  2.  Metastatic small cell lung carcinoma. Liver metastasis. LFTs are stable, bilirubin trending down Oncology consulted.  Highly appreciate Dr. Worthy Flank assistance in initiating chemotherapy here in the hospital. Patient was explained the intent is palliative only.  3.  Acute hypoxic respiratory failure. Hemoptysis and wheezing. Possible COPD. Former smoker. Hemoptysis is resolved, cough is improved.  Wheezing is also resolved. Patient requiring 2L of oxygen at present this morning. Unremarkable chest x-ray. Provide incentive spirometry. Already on Lasix with negative in and out. Continue nicotine patch. Continue nebulizers as needed. Continue Mucinex.  4.  Diabetes mellitus.  Type II, uncontrolled with hyperglycemia secondary to steroids Hemoglobin A1c 7.2. Controlled, no complication.,  No long-term insulin use. Holding metformin. Continue insulin sliding scale  scheduled Lantus 8 units daily. Glucerna.  5.  GERD Constipation On stool softener.  Monitor.  6.  Essential hypertension. Blood pressure is currently relatively on the softer side, discontinue all antihypertensive medication except Lasix.  7.  Hyperkalemia. Likely due to chemo. We will monitor for now.  8.  Insomnia. PRN Ambien.  9. Mucositis Magic mouthwash  Diet: Carb modified, fluid restricted to 1.5 L. DVT Prophylaxis: subcutaneous Heparin  Advance goals of care discussion: full code  Family Communication: no family was present at bedside, at the time of interview.  Disposition:  Discharge to SNF.  Consultants: nephrology, oncology, IR Procedures: liver biopsy  Antibiotics: Anti-infectives (From admission, onward)   Start     Dose/Rate Route Frequency Ordered Stop   09/29/17 1500  demeclocycline (DECLOMYCIN) tablet 300 mg     300 mg Oral Every 12 hours 09/29/17 1138         Objective: Physical Exam: Vitals:   10/06/17 2210 10/07/17 0559 10/07/17 1418 10/07/17 1422  BP: 130/63 (!) 142/68 129/66   Pulse: 89 92 83   Resp: 16 16 20    Temp: 97.7 F (36.5 C) 97.7 F (36.5 C) 97.8 F (36.6 C)   TempSrc: Oral Oral Oral   SpO2: 96% 97% 100% 99%  Weight:      Height:        Intake/Output Summary (Last 24 hours) at 10/07/2017 1637 Last data filed at 10/07/2017 1418 Gross per 24 hour  Intake 480 ml  Output 1800 ml  Net -1320 ml   Filed Weights   09/27/17 1844 10/05/17 1209  Weight: (!) 140.6 kg (310 lb) (!) 137.8 kg (303 lb 12.8 oz)   General: Alert, Awake and Oriented to Time, Place and Person. Appear in moderate distress, affect appropriate Eyes: PERRL, Conjunctiva normal  ENT: Oral Mucosa clear moist. Neck: difficult to assess  JVD, no Abnormal Mass Or lumps Cardiovascular: S1 and S2 Present, no Murmur, Peripheral Pulses Present Respiratory: normal respiratory effort, Bilateral Air entry equal and Decreased, no use of accessory muscle, basal Crackles,  no wheezes Abdomen: Bowel Sound present, Soft and no tenderness, no hernia Skin: on redness, no Rash, no induration Extremities: no Pedal edema, no calf tenderness Neurologic: Grossly no focal neuro deficit. Bilaterally Equal motor strength  Data Reviewed: CBC: Recent Labs  Lab 10/04/17 0319 10/05/17 0802 10/06/17 0433 10/07/17 0531  WBC 9.6 9.5 7.7 7.6  NEUTROABS 7.4  --   --   --   HGB 14.6 14.1 13.6 13.8  HCT 39.6 39.0 38.3* 38.1*  MCV 86.1 88.8 88.9 89.0  PLT 155 154 146* 683*   Basic Metabolic Panel: Recent Labs  Lab 10/03/17 1602 10/04/17 0319 10/05/17 0802 10/06/17 0433 10/07/17 0531  NA 126* 126* 131* 129* 130*  K 4.7 4.7 4.8 5.3* 5.4*  CL 83* 83* 86* 84* 89*  CO2 30 32 33* 34* 29  GLUCOSE 186* 200* 210* 373* 316*  BUN 52* 53* 53* 57* 56*  CREATININE 1.13 1.30* 0.98 1.03 1.00  CALCIUM 8.9 8.8* 8.9 8.5* 8.4*  MG  --  2.3  --   --   --     Liver Function Tests: Recent Labs  Lab 10/04/17 0319 10/05/17 0802 10/06/17 0433 10/07/17 0531  AST 132* 106* 99* 101*  ALT 140* 126* 118* 123*  ALKPHOS 219* 223* 248* 237*  BILITOT 12.7* 12.3* 11.9* 11.2*  PROT 6.5 6.3* 6.0* 5.7*  ALBUMIN 2.4* 2.5* 2.2* 2.2*   CBG: Recent Labs  Lab 10/06/17 0810 10/06/17 1239 10/06/17 2006 10/07/17 0754 10/07/17 1201  GLUCAP 335* 325* 434* 294* 368*   Studies: No results found.  Scheduled Meds: . demeclocycline  300 mg Oral Q12H  . dextromethorphan-guaiFENesin  1 tablet Oral BID  . famotidine  10 mg Oral Daily  . feeding supplement (GLUCERNA SHAKE)  237 mL Oral BID BM  . furosemide  40 mg Oral BID  . HYDROcodone-acetaminophen  10 mL Oral BID  . insulin aspart  0-15 Units Subcutaneous TID WC  . insulin aspart  0-5 Units Subcutaneous QHS  . insulin glargine  8 Units Subcutaneous Daily  . ipratropium-albuterol  3 mL Nebulization TID  . magic mouthwash w/lidocaine  5 mL Oral QID  . mouth rinse  15 mL Mouth Rinse BID  . nicotine  21 mg Transdermal Daily  . omega-3  acid ethyl esters  1 g Oral BID  . polyethylene glycol  17 g Oral Daily   Continuous Infusions:  PRN Meds: albuterol, alteplase, bisacodyl, heparin lock flush, prochlorperazine, senna-docusate, sodium chloride flush, sodium chloride flush, traMADol, zolpidem  Time spent: 35 minutes  Author: Berle Mull, MD [facility] Pager: [phone] 10/07/2017 4:37 PM  If 7PM-7AM, please contact night-coverage at [email or web address], password [facility]

## 2017-10-07 NOTE — Progress Notes (Signed)
Physical Therapy Treatment Patient Details Name: Robert Compton MRN: [id number] DOB: 12-07-1945 Today's Date: 10/07/2017    History of Present Illness 72 y.o. male with medical history significant of hypertension diabetes type 2, right TKR and  new diagnosis of metastatic cancer likely lung with liver mets admitted due to weakness and nausea    PT Comments    Pt progressing slowly, will benefit from SNF; much encouragement needed for pt to participate with PT   Follow Up Recommendations  SNF     Equipment Recommendations  None recommended by PT    Recommendations for Other Services       Precautions / Restrictions Precautions Precautions: Fall Precaution Comments: monitor vitals:  initially orthostatic this admission Restrictions Weight Bearing Restrictions: No    Mobility  Bed Mobility Overal bed mobility: Needs Assistance Bed Mobility: Supine to Sit     Supine to sit: Min assist     General bed mobility comments: assist with UB/elevate trunk, cues to self assist  Transfers Overall transfer level: Needs assistance Equipment used: 2 person hand held assist Transfers: Sit to/from Stand;Stand Pivot Transfers Sit to Stand: Min assist;+2 safety/equipment;From elevated surface Stand pivot transfers: Min assist;+2 physical assistance;+2 safety/equipment       General transfer comment: assist to rise and steadly, stand pivot to [facility]  Ambulation/Gait             General Gait Details: pt refused amb today   Stairs             Wheelchair Mobility    Modified Rankin (Stroke Patients Only)       Balance Overall balance assessment: Needs assistance Sitting-balance support: No upper extremity supported Sitting balance-Leahy Scale: Good     Standing balance support: Bilateral upper extremity supported Standing balance-Leahy Scale: Poor Standing balance comment: reliant on UE support                            Cognition  Arousal/Alertness: Awake/alert Behavior During Therapy: WFL for tasks assessed/performed Overall Cognitive Status: Within Functional Limits for tasks assessed                                        Exercises      General Comments General comments (skin integrity, edema, etc.): BP 118/64 sitting; BP 121/70 after transfer      Pertinent Vitals/Pain Pain Assessment: 0-10 Pain Score: 4  Pain Location: bil LEs Pain Descriptors / Indicators: Discomfort;Grimacing;Heaviness Pain Intervention(s): Monitored during session    Home Living                      Prior Function            PT Goals (current goals can now be found in the care plan section) Acute Rehab PT Goals Patient Stated Goal: rest more PT Goal Formulation: With patient Time For Goal Achievement: 10/13/17 Potential to Achieve Goals: Good Progress towards PT goals: Progressing toward goals    Frequency    Min 3X/week      PT Plan Current plan remains appropriate    Co-evaluation              AM-PAC PT "6 Clicks" Daily Activity  Outcome Measure  Difficulty turning over in bed (including adjusting bedclothes, sheets and blankets)?: Unable Difficulty moving from lying on back to sitting  on the side of the bed? : Unable Difficulty sitting down on and standing up from a chair with arms (e.g., wheelchair, bedside commode, etc,.)?: Unable Help needed moving to and from a bed to chair (including a wheelchair)?: A Lot Help needed walking in hospital room?: Total Help needed climbing 3-5 steps with a railing? : Total 6 Click Score: 7    End of Session Equipment Utilized During Treatment: Gait belt Activity Tolerance: Patient limited by fatigue Patient left: Other (comment);with call bell/phone within reach(BSC, RN notified, pt requesting privacy) Nurse Communication: Mobility status PT Visit Diagnosis: Difficulty in walking, not elsewhere classified (R26.2);Muscle weakness  (generalized) (M62.81)     Time: 6283-1517 PT Time Calculation (min) (ACUTE ONLY): 28 min  Charges:  $Therapeutic Activity: 23-37 mins                    G CodesKenyon Ana, PT Pager: [phone] 10/07/2017    Kenyon Ana 10/07/2017, 1:56 PM

## 2017-10-07 NOTE — Progress Notes (Signed)
Pt's daughter selected [facility] from bed offers. Confirmed with admissions- initiated [facility] authorization request.   Sharren Bridge, MSW, LCSW Clinical Social Work 10/07/2017 [phone]

## 2017-10-07 NOTE — Progress Notes (Signed)
Subjective: The patient is seen and examined today.  He was eating his breakfast this morning.  He denied having any chest pain, shortness of breath, cough or hemoptysis.  He has no nausea, vomiting, diarrhea or constipation.  He denied having any fever or chills.  He tolerated the 3 days of chemotherapy fairly well.  Objective: Vital signs in last 24 hours: Temp:  [97.7 F (36.5 C)-98.6 F (37 C)] (P) 97.7 F (36.5 C) (06/24 0559) Pulse Rate:  [84-92] (P) 92 (06/24 0559) Resp:  [15-16] (P) 16 (06/24 0559) BP: (129-130)/(63-68) (P) 142/68 (06/24 0559) SpO2:  [92 %-99 %] (P) 97 % (06/24 0559)  Intake/Output from previous day: 06/23 0701 - 06/24 0700 In: 840 [P.O.:840] Out: 2700 [Urine:2700] Intake/Output this shift: No intake/output data recorded.  General appearance: alert, cooperative, fatigued, icteric and no distress Resp: clear to auscultation bilaterally Cardio: regular rate and rhythm, S1, S2 normal, no murmur, click, rub or gallop GI: soft, non-tender; bowel sounds normal; no masses,  no organomegaly Extremities: extremities normal, atraumatic, no cyanosis or edema  Lab Results:  Recent Labs    10/06/17 0433 10/07/17 0531  WBC 7.7 7.6  HGB 13.6 13.8  HCT 38.3* 38.1*  PLT 146* 144*   BMET Recent Labs    10/06/17 0433 10/07/17 0531  NA 129* 130*  K 5.3* 5.4*  CL 84* 89*  CO2 34* 29  GLUCOSE 373* 316*  BUN 57* 56*  CREATININE 1.03 1.00  CALCIUM 8.5* 8.4*    Studies/Results: No results found.  Medications: I have reviewed the patient's current medications.   Assessment/Plan: This is a very pleasant 72 years old white male recently diagnosed with extensive stage small cell lung cancer status post first cycle of systemic chemotherapy with reduced dose carboplatin and etoposide.  He tolerated the first 3 days of this treatment fairly well with no concerning complaints.  We will start the patient on treatment with Granix 480 mcg subcutaneously tomorrow unless  the patient is discharged from the hospital.  In this case we will arrange for him to receive Neulasta injection on outpatient basis. For the hyponatremia, his serum sodium has significantly improved.  We will continue with the current fluid restriction for now.  It is expected to continue to improve after his treatment. Hepatic dysfunction, secondary to liver metastasis.  It is currently stable.  We will continue to monitor. I will arrange for the patient a follow-up appointment with me at the [facility] cancer center after discharge for reevaluation and close monitoring of his condition. Thank you for taking good care of Mr. Covington, I will continue to follow-up the patient with you and assist in his management.   LOS: 10 days    Eilleen Kempf 10/07/2017

## 2017-10-08 ENCOUNTER — Inpatient Hospital Stay (HOSPITAL_COMMUNITY): Payer: BLUE CROSS/BLUE SHIELD

## 2017-10-08 LAB — COMPREHENSIVE METABOLIC PANEL
ALK PHOS: 281 U/L — AB (ref 38–126)
ALT: 139 U/L — AB (ref 0–44)
AST: 121 U/L — AB (ref 15–41)
Albumin: 2.4 g/dL — ABNORMAL LOW (ref 3.5–5.0)
Anion gap: 12 (ref 5–15)
BUN: 55 mg/dL — AB (ref 8–23)
CALCIUM: 8.5 mg/dL — AB (ref 8.9–10.3)
CO2: 29 mmol/L (ref 22–32)
CREATININE: 1.09 mg/dL (ref 0.61–1.24)
Chloride: 87 mmol/L — ABNORMAL LOW (ref 98–111)
Glucose, Bld: 293 mg/dL — ABNORMAL HIGH (ref 70–99)
Potassium: 5.2 mmol/L — ABNORMAL HIGH (ref 3.5–5.1)
Sodium: 128 mmol/L — ABNORMAL LOW (ref 135–145)
Total Bilirubin: 11.8 mg/dL — ABNORMAL HIGH (ref 0.3–1.2)
Total Protein: 6 g/dL — ABNORMAL LOW (ref 6.5–8.1)

## 2017-10-08 LAB — CBC
HEMATOCRIT: 39.5 % (ref 39.0–52.0)
Hemoglobin: 14 g/dL (ref 13.0–17.0)
MCH: 31.7 pg (ref 26.0–34.0)
MCHC: 35.4 g/dL (ref 30.0–36.0)
MCV: 89.4 fL (ref 78.0–100.0)
Platelets: 152 10*3/uL (ref 150–400)
RBC: 4.42 MIL/uL (ref 4.22–5.81)
RDW: 15 % (ref 11.5–15.5)
WBC: 6.4 10*3/uL (ref 4.0–10.5)

## 2017-10-08 LAB — GLUCOSE, CAPILLARY
GLUCOSE-CAPILLARY: 334 mg/dL — AB (ref 70–99)
GLUCOSE-CAPILLARY: 290 mg/dL — AB (ref 70–99)
GLUCOSE-CAPILLARY: 286 mg/dL — AB (ref 70–99)
Glucose-Capillary: 257 mg/dL — ABNORMAL HIGH (ref 70–99)
Glucose-Capillary: 294 mg/dL — ABNORMAL HIGH (ref 70–99)

## 2017-10-08 MED ORDER — NICOTINE 21 MG/24HR TD PT24
21.0000 mg | MEDICATED_PATCH | Freq: Every day | TRANSDERMAL | 0 refills | Status: DC
Start: 2017-10-09 — End: 2017-11-12

## 2017-10-08 MED ORDER — ORAL CARE MOUTH RINSE: 15.0000 mL | Freq: Two times a day (BID) | OROMUCOSAL | 0 refills | Status: DC

## 2017-10-08 MED ORDER — TRAMADOL HCL 50 MG PO TABS: 50.0000 mg | ORAL_TABLET | Freq: Four times a day (QID) | ORAL | 0 refills | Status: DC | PRN

## 2017-10-08 MED ORDER — METHOCARBAMOL 500 MG PO TABS
500.0000 mg | ORAL_TABLET | Freq: Three times a day (TID) | ORAL | Status: DC
  Administered 2017-10-08 (×2): 500 mg via ORAL
  Filled 2017-10-08 (×2): qty 1

## 2017-10-08 MED ORDER — METHOCARBAMOL 500 MG PO TABS: 500.0000 mg | ORAL_TABLET | Freq: Three times a day (TID) | ORAL | 0 refills | Status: DC

## 2017-10-08 MED ORDER — INSULIN ASPART 100 UNIT/ML ~~LOC~~ SOLN: SUBCUTANEOUS | 11 refills | Status: DC

## 2017-10-08 MED ORDER — FUROSEMIDE 40 MG PO TABS: 40.0000 mg | ORAL_TABLET | Freq: Two times a day (BID) | ORAL | 0 refills | Status: DC

## 2017-10-08 MED ORDER — MAGIC MOUTHWASH W/LIDOCAINE: 5.0000 mL | Freq: Four times a day (QID) | ORAL | 0 refills | Status: DC

## 2017-10-08 MED ORDER — POLYETHYLENE GLYCOL 3350 17 G PO PACK: 17.0000 g | PACK | Freq: Every day | ORAL | 0 refills | Status: DC

## 2017-10-08 MED ORDER — HYDROCODONE-ACETAMINOPHEN 7.5-325 MG/15ML PO SOLN: 10.0000 mL | Freq: Two times a day (BID) | ORAL | 0 refills | Status: DC | PRN

## 2017-10-08 MED ORDER — DEMECLOCYCLINE HCL 150 MG PO TABS: 300.0000 mg | ORAL_TABLET | Freq: Two times a day (BID) | ORAL | 0 refills | Status: DC

## 2017-10-08 MED ORDER — IPRATROPIUM-ALBUTEROL 0.5-2.5 (3) MG/3ML IN SOLN: 3.0000 mL | Freq: Two times a day (BID) | RESPIRATORY_TRACT | Status: DC

## 2017-10-08 MED ORDER — GLUCERNA SHAKE PO LIQD: 237.0000 mL | Freq: Two times a day (BID) | ORAL | 0 refills | Status: DC

## 2017-10-08 MED ORDER — MUSCLE RUB 10-15 % EX CREA
TOPICAL_CREAM | CUTANEOUS | Status: DC | PRN
Start: 2017-10-08 — End: 2017-10-08
  Administered 2017-10-08: 1 via TOPICAL
  Filled 2017-10-08: qty 85

## 2017-10-08 MED ORDER — DM-GUAIFENESIN ER 30-600 MG PO TB12: 1.0000 | ORAL_TABLET | Freq: Two times a day (BID) | ORAL | 0 refills | Status: AC

## 2017-10-08 MED ORDER — IPRATROPIUM-ALBUTEROL 0.5-2.5 (3) MG/3ML IN SOLN: 3.0000 mL | Freq: Four times a day (QID) | RESPIRATORY_TRACT | 0 refills | Status: DC | PRN

## 2017-10-08 MED ORDER — HYDROMORPHONE HCL 1 MG/ML IJ SOLN
1.0000 mg | Freq: Once | INTRAMUSCULAR | Status: AC
Start: 2017-10-08 — End: 2017-10-08
  Administered 2017-10-08: 1 mg via INTRAVENOUS
  Filled 2017-10-08: qty 1

## 2017-10-08 NOTE — Progress Notes (Signed)
Attempted to call report to [facility]. Transferred to [facility] section with no answer will attempt again later.

## 2017-10-08 NOTE — Discharge Summary (Addendum)
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 Please follow up with PCP in 1 week 2. Need oncology follow up for neulasta   Contact information for follow-up providers    Avva, Ravisankar, MD. Schedule an appointment as soon as possible for a visit in 1 week(s).   Specialty:  Internal Medicine Contact information: [place] [address] [phone]            Contact information for after-discharge care    Destination    [facility] .   Service:  Skilled Nursing Contact information: [address] [place] [phone]                 Diet recommendation: cardiac carb modified diet  Activity: The patient is advised to gradually reintroduce usual activities.  Discharge Condition: good  Code Status: full code  History of present illness: As per the H and P dictated on admission, "Errick Salts is a 72 y.o. male with medical history significant of hypertension diabetes type 2 and  new diagnosis of metastatic cancer likely lung with liver mets    Presented with generalized fatigue persistent nausea poorly controlled Seen in the emergency department on 7 June with abdominal distention and hematuria CT abdomen occipital masses in his liver. He was discharged home with follow-up with his primary care provider GI and urology with prescription  for morphine 15 mg every 4 hours and  Zofran.  Since then he has been seen by his primary care provider On 10 June undergone contrasted CT of abdomen and chest 2.7 cm likely hepatic metastases mediastinal lymphadenopathy likely metastatic lymphadenopathy irregular mass within right lung apex measuring 3 cm for primary lung cancer.  Dr. Marin Olp the oncology has ordered ultrasound-guided liver biopsy to be done  Unfortunately patient continued to deteriorate he reports  no fevers or chills no chest pain or shortness of breath no confusion patient has been slowly deteriorating at home. Have been having non productive cough same from prior Family reports generalized weakness and feeling like he had an ear infection his legs were numb. His family gave him Dramamine and he has been even more sedated. Reports Morphine has been too strong resulting in sedation and severe constipation. "  Hospital Course:  Summary of his active problems in the hospital is as following. 1.  Hyponatremia. Secondary to SIADH. Metastatic small cell lung carcinoma with liver mets. Sodium 129.  Pseudo- Patient was started on demeclocycline and Lasix, continue. Cut lasix from 160 bid to 40 bid for now while he is getting chemo Avapro, Norvasc, diclofenac, chlorpheniramine DC'd Appreciate nephrology assistance. Nephrology recommended pt can follow up with PCP.   2.  Metastatic small cell lung carcinoma. Liver metastasis. LFTs are stable, bilirubin trending down Oncology consulted.  Highly appreciate Dr. Worthy Flank assistance in initiating chemotherapy here in the hospital. Patient was explained the intent  is palliative only. Tolerated chemo well Outpatient neulasta injection   3.  Acute hypoxic respiratory failure. Hemoptysis and wheezing. Possible COPD. Former smoker. Hemoptysis is resolved, cough is improved.  Wheezing is also resolved. Patient requiring 2L of oxygen at present this morning. Unremarkable  chest x-ray. Provide incentive spirometry. Already on Lasix with negative in and out. Continue nicotine patch. Continue nebulizers as needed. Continue Mucinex.  4.  Diabetes mellitus.  Type II, uncontrolled with hyperglycemia secondary to steroids Hemoglobin A1c 7.2. Controlled, no complication.,  No long-term insulin use. Continue metformin. Continue insulin sliding scale Glucerna.  5.  GERD Constipation On stool softener.  Monitor.  6.  Essential hypertension. Blood pressure is currently relatively on the softer side, discontinue all antihypertensive medication except Lasix.  7.  Hyperkalemia. Likely due to chemo. We will monitor for now.  8.  Insomnia. PRN Ambien.  9. Mucositis Magic mouthwash  All other chronic medical condition were stable during the hospitalization.  Patient was seen by physical therapy, who recommended SNF, which was arranged by Education officer, museum and case Freight forwarder. On the day of the discharge the patient's vitals were stable , and no other acute medical condition were reported by patient. the patient was felt safe to be discharge at [facility] with theapy.  Consultants: nephrology, oncology, IR Procedures: liver biopsy Chemotherapy first cycle with carbo etoposide   DISCHARGE MEDICATION: Allergies as of 10/08/2017      Reactions   Amoxicillin-pot Clavulanate Anaphylaxis   Nitrofurantoin Anaphylaxis      Medication List    STOP taking these medications   Cinnamon 500 MG capsule   irbesartan 300 MG tablet Commonly known as:  AVAPRO   morphine 15 MG tablet Commonly known as:  MSIR     TAKE these medications   aspirin 81 MG tablet Take 81 mg by mouth 2 (two) times daily.   demeclocycline 150 MG tablet Commonly known as:  DECLOMYCIN Take 2 tablets (300 mg total) by mouth 2 (two) times daily.   dextromethorphan-guaiFENesin 30-600 MG 12hr tablet Commonly known as:  MUCINEX DM Take 1 tablet by mouth 2 (two) times daily for 5 days.     feeding supplement (GLUCERNA SHAKE) Liqd Take 237 mLs by mouth 2 (two) times daily between meals.   fish oil-omega-3 fatty acids 1000 MG capsule Take 1 g by mouth 2 (two) times daily.   fluticasone 50 MCG/ACT nasal spray Commonly known as:  FLONASE Place 1 spray into both nostrils daily as needed for allergies.   furosemide 40 MG tablet Commonly known as:  LASIX Take 1 tablet (40 mg total) by mouth 2 (two) times daily.   HYDROcodone-acetaminophen 7.5-325 mg/15 ml solution Commonly known as:  HYCET Take 10 mLs by mouth every 12 (twelve) hours as needed (COUGH).   insulin aspart 100 UNIT/ML injection Commonly known as:  novoLOG CBG < 70: implement hypoglycemia protocol CBG 70 - 120: 0 units CBG 121 - 150: 2 units CBG 151 - 200: 3 units CBG 201 - 250: 5 units CBG 251 - 300: 8 units CBG 301 - 350: 11 units CBG 351 - 400: 15 units CBG > 400: call MD   ipratropium-albuterol 0.5-2.5 (3) MG/3ML Soln Commonly known as:  DUONEB Take 3 mLs by nebulization every 6 (six) hours as needed.   magic mouthwash w/lidocaine Soln Take 5 mLs by mouth 4 (four) times daily.   metFORMIN 1000 MG tablet Commonly known as:  GLUCOPHAGE Take 1,000 mg by mouth 2 (two) times daily with a  meal.   methocarbamol 500 MG tablet Commonly known as:  ROBAXIN Take 1 tablet (500 mg total) by mouth 3 (three) times daily.   mouth rinse Liqd solution 15 mLs by Mouth Rinse route 2 (two) times daily.   nicotine 21 mg/24hr patch Commonly known as:  NICODERM CQ - dosed in mg/24 hours Place 1 patch (21 mg total) onto the skin daily. Start taking on:  10/09/2017   ondansetron 4 MG disintegrating tablet Commonly known as:  ZOFRAN ODT 4mg  ODT q4 hours prn nausea/vomit   polyethylene glycol packet Commonly known as:  MIRALAX / GLYCOLAX Take 17 g by mouth daily. Start taking on:  10/09/2017   ranitidine 150 MG tablet Commonly known as:  ZANTAC Take 150 mg by mouth 2 (two) times daily. For 14 doses   Red Yeast  Rice 600 MG Tabs Take 1 tablet by mouth daily.   traMADol 50 MG tablet Commonly known as:  ULTRAM Take 1 tablet (50 mg total) by mouth every 6 (six) hours as needed for moderate pain.      Allergies  Allergen Reactions  . Amoxicillin-Pot Clavulanate Anaphylaxis  . Nitrofurantoin Anaphylaxis   Discharge Instructions    Diet - low sodium heart healthy   Complete by:  As directed    Discharge instructions   Complete by:  As directed    It is important that you read following instructions as well as go over your medication list with RN to help you understand your care after this hospitalization.  Discharge Instructions: Please follow-up with PCP in one week  Please request your primary care physician to go over all Hospital Tests and Procedure/Radiological results at the follow up,  Please get all Hospital records sent to your PCP by signing hospital release before you go home.   Do not drive, operating heavy machinery, perform activities at heights, swimming or participation in water activities or provide baby sitting services; until you have been seen by Primary Care Physician or a Neurologist and advised to do so again. Do not take more than prescribed Pain, Sleep and Anxiety Medications. You were cared for by a hospitalist during your hospital stay. If you have any questions about your discharge medications or the care you received while you were in the hospital after you are discharged, you can call the unit and ask to speak with the hospitalist on call if the hospitalist that took care of you is not available.  Once you are discharged, your primary care physician will handle any further medical issues. Please note that NO REFILLS for any discharge medications will be authorized once you are discharged, as it is imperative that you return to your primary care physician (or establish a relationship with a primary care physician if you do not have one) for your aftercare needs so that  they can reassess your need for medications and monitor your lab values. You Must read complete instructions/literature along with all the possible adverse reactions/side effects for all the Medicines you take and that have been prescribed to you. Take any new Medicines after you have completely understood and accept all the possible adverse reactions/side effects. Wear Seat belts while driving. If you have smoked or chewed Tobacco in the last 2 yrs please stop smoking and/or stop any Recreational drug use.   Increase activity slowly   Complete by:  As directed    SCHEDULING COMMUNICATION   Complete by:  As directed    Chemotherapy Appointment - 3.5 hr  TREATMENT CONDITIONS   Complete by:  As directed    Patient should have CBC & CMP within 7 days prior to chemotherapy administration. NOTIFY MD IF: ANC < 1500, Hemoglobin < 8, PLT < 100,000,  Total Bili > 1.5, Creatinine > 1.5, ALT & AST > 80 or if patient has unstable vital signs: Temperature > 38.5, SBP > 180 or < 90, RR > 30 or HR > 100.     Discharge Exam: Filed Weights   09/27/17 1844 10/05/17 1209  Weight: (!) 140.6 kg (310 lb) (!) 137.8 kg (303 lb 12.8 oz)   Vitals:   10/08/17 0647 10/08/17 0745  BP: 131/65   Pulse: 99 84  Resp: 16 18  Temp: (!) 97.5 F (36.4 C)   SpO2: 96% 91%   General: Appear in no distress, no Rash; Oral Mucosa moist. Cardiovascular: S1 and S2 Present, no Murmur, n JVD Respiratory: Bilateral Air entry present and Clear to Auscultation, no Crackles,  wheezes Abdomen: Bowel Sound present, Soft and no tenderness Extremities: no Pedal edema, no calf tenderness Neurology: Grossly no focal neuro deficit.  The results of significant diagnostics from this hospitalization (including imaging, microbiology, ancillary and laboratory) are listed below for reference.    Significant Diagnostic Studies: Dg Lumbar Spine 2-3 Views  Result Date: 10/08/2017 CLINICAL DATA:  Acute back pain for 2 days, no known injury,  initial encounter EXAM: LUMBAR SPINE - 2-3 VIEW COMPARISON:  09/23/2017 FINDINGS: Five lumbar type vertebral bodies are well visualized. Vertebral body height is well maintained. Mild osteophytic changes are noted. No soft tissue abnormality is seen. No anterolisthesis is noted. IMPRESSION: Mild degenerative change without acute abnormality. Electronically Signed   By: Inez Catalina M.D.   On: 10/08/2017 09:53   Ct Chest W Contrast  Result Date: 09/23/2017 CLINICAL DATA:  Evaluate for primary. Liver mass seen on CT abdomen and pelvis without contrast. EXAM: CT CHEST, ABDOMEN, AND PELVIS WITH CONTRAST TECHNIQUE: Multidetector CT imaging of the chest, abdomen and pelvis was performed following the standard protocol during bolus administration of intravenous contrast. CONTRAST:  171mL ISOVUE-300 IOPAMIDOL (ISOVUE-300) INJECTION 61% COMPARISON:  CT abdomen dated 09/20/2017. FINDINGS: CT CHEST FINDINGS Cardiovascular: Heart size is normal. No pericardial effusion. No thoracic aortic aneurysm. Scattered aortic atherosclerosis. Mediastinum/Nodes: Conglomerate lymphadenopathy within the RIGHT lower paratracheal space, largest component measuring 4.4 x 3.3 cm, with scattered satellite lymph nodes extending from the RIGHT upper paratracheal space to the subcarinal space which are also likely indicative of metastatic disease. Esophagus appears normal. Trachea and central bronchi are unremarkable. Lungs/Pleura: Irregular mass within the RIGHT lung apex, measuring 3 cm craniocaudal dimension (coronal series 3, image 107) and 2 cm AP dimension (axial series 6, image 27). Two additional subpleural nodules are seen adjacent to the RIGHT minor fissure, measuring 10 mm and 8 mm (axial series 6, images 75 and 76 respectively). LEFT lung is clear.  No pleural effusion. Musculoskeletal: No acute or suspicious osseous findings. No convincing evidence of osseous metastasis. CT ABDOMEN PELVIS FINDINGS Hepatobiliary: Multiple masses  within the RIGHT liver lobe, segment 6, 3 separate lesions with largest measuring 2.7 cm. Additional 1.4 cm lesion at the inferior aspects of the RIGHT liver lobe cyst, likely segment 5. Gallbladder appears normal.  No bile duct dilatation. Pancreas: Unremarkable. No pancreatic ductal dilatation or surrounding inflammatory changes. Spleen: Normal in size without focal abnormality. Adrenals/Urinary Tract: LEFT renal cyst measures 4.4 cm. Both kidneys appear otherwise normal without suspicious mass, stone or hydronephrosis. Bladder appears normal. Stomach/Bowel:  Bowel is normal in caliber. No bowel wall thickening or evidence of bowel wall inflammation seen. Stomach appears normal. Vascular/Lymphatic: No enlarged lymph nodes appreciated within the abdomen or pelvis. Aortic atherosclerosis. Reproductive: Prostate is unremarkable. Other: No free fluid or abscess collection. No soft tissue mass within the abdomen or pelvis. Musculoskeletal: No acute or suspicious osseous finding. No convincing evidence of osseous metastasis. IMPRESSION: 1. Multiple hypoenhancing masses within the RIGHT liver lobe, segments 5 and 6, largest lesion measuring 2.7 cm within segment 6, all consistent with hepatic metastases. 2. Conglomerate mediastinal lymphadenopathy in the RIGHT paratracheal space, largest component within the lower RIGHT paratracheal space measuring 4.4 x 3.3 cm, with some degree of central necrosis, consistent with metastatic lymphadenopathy. 3. Irregular mass within the RIGHT lung apex, measuring 3 cm greatest dimension, suspicious for primary lung cancer, alternatively additional metastasis. 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 4. Additional chronic/incidental findings detailed above. The 2 subpleural pulmonary nodules within the RIGHT lower lobe could be benign or additional metastases, favor unrelated chronic/benign nodules. Electronically Signed   By:  Franki Cabot M.D.   On: 09/23/2017 16:38   Ct Abdomen Pelvis W Contrast  Result Date: 09/23/2017 CLINICAL DATA:  Evaluate for primary. Liver mass seen on CT abdomen and pelvis without contrast. EXAM: CT CHEST, ABDOMEN, AND PELVIS WITH CONTRAST TECHNIQUE: Multidetector CT imaging of the chest, abdomen and pelvis was performed following the standard protocol during bolus administration of intravenous contrast. CONTRAST:  126mL ISOVUE-300 IOPAMIDOL (ISOVUE-300) INJECTION 61% COMPARISON:  CT abdomen dated 09/20/2017. FINDINGS: CT CHEST FINDINGS Cardiovascular: Heart size is normal. No pericardial effusion. No thoracic aortic aneurysm. Scattered aortic atherosclerosis. Mediastinum/Nodes: Conglomerate lymphadenopathy within the RIGHT lower paratracheal space, largest component measuring 4.4 x 3.3 cm, with scattered satellite lymph nodes extending from the RIGHT upper paratracheal space to the subcarinal space which are also likely indicative of metastatic disease. Esophagus appears normal. Trachea and central bronchi are unremarkable. Lungs/Pleura: Irregular mass within the RIGHT lung apex, measuring 3 cm craniocaudal dimension (coronal series 3, image 107) and 2 cm AP dimension (axial series 6, image 27). Two additional subpleural nodules are seen adjacent to the RIGHT minor fissure, measuring 10 mm and 8 mm (axial series 6, images 75 and 76 respectively). LEFT lung is clear.  No pleural effusion. Musculoskeletal: No acute or suspicious osseous findings. No convincing evidence of osseous metastasis. CT ABDOMEN PELVIS FINDINGS Hepatobiliary: Multiple masses within the RIGHT liver lobe, segment 6, 3 separate lesions with largest measuring 2.7 cm. Additional 1.4 cm lesion at the inferior aspects of the RIGHT liver lobe cyst, likely segment 5. Gallbladder appears normal.  No bile duct dilatation. Pancreas: Unremarkable. No pancreatic ductal dilatation or surrounding inflammatory changes. Spleen: Normal in size without  focal abnormality. Adrenals/Urinary Tract: LEFT renal cyst measures 4.4 cm. Both kidneys appear otherwise normal without suspicious mass, stone or hydronephrosis. Bladder appears normal. Stomach/Bowel: Bowel is normal in caliber. No bowel wall thickening or evidence of bowel wall inflammation seen. Stomach appears normal. Vascular/Lymphatic: No enlarged lymph nodes appreciated within the abdomen or pelvis. Aortic atherosclerosis. Reproductive: Prostate is unremarkable. Other: No free fluid or abscess collection. No soft tissue mass within the abdomen or pelvis. Musculoskeletal: No acute or suspicious osseous finding. No convincing evidence of osseous metastasis. IMPRESSION: 1. Multiple hypoenhancing masses within the RIGHT liver lobe, segments 5 and 6, largest lesion measuring 2.7 cm within segment 6, all consistent with hepatic metastases. 2. Conglomerate mediastinal lymphadenopathy in the  RIGHT paratracheal space, largest component within the lower RIGHT paratracheal space measuring 4.4 x 3.3 cm, with some degree of central necrosis, consistent with metastatic lymphadenopathy. 3. Irregular mass within the RIGHT lung apex, measuring 3 cm greatest dimension, suspicious for primary lung cancer, alternatively additional metastasis. 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 4. Additional chronic/incidental findings detailed above. The 2 subpleural pulmonary nodules within the RIGHT lower lobe could be benign or additional metastases, favor unrelated chronic/benign nodules. Electronically Signed   By: Franki Cabot M.D.   On: 09/23/2017 16:38   US Biopsy (liver)  Result Date: 09/30/2017 INDICATION: Concern for metastatic lung cancer. Please perform ultrasound-guided liver lesion biopsy for tissue diagnostic purposes. EXAM: ULTRASOUND GUIDED LIVER LESION BIOPSY COMPARISON:  CT the chest, abdomen and pelvis - 09/23/2017; CT abdomen pelvis - 09/20/2017  MEDICATIONS: None ANESTHESIA/SEDATION: Fentanyl 50 mcg IV; Versed 1 mg IV Total Moderate Sedation time:  12 Minutes. The patient's level of consciousness and vital signs were monitored continuously by radiology nursing throughout the procedure under my direct supervision. COMPLICATIONS: None immediate. PROCEDURE: Informed written consent was obtained from the patient after a discussion of the risks, benefits and alternatives to treatment. The patient understands and consents the procedure. A timeout was performed prior to the initiation of the procedure. Ultrasound scanning was performed of the right upper abdominal quadrant demonstrates an approximately 3.2 by 2.3 cm mass within the right lobe of the liver (image 25) compatible with ill-defined mass seen on preceding abdominal CT performed 09/20/2017 on image 30, series 2. The procedure was planned. The right upper abdominal quadrant was prepped and draped in the usual sterile fashion. The overlying soft tissues were anesthetized with 1% lidocaine with epinephrine. A 17 gauge, 6.8 cm co-axial needle was advanced into a peripheral aspect of the lesion. This was followed by 5 core biopsies with an 18 gauge core device under direct ultrasound guidance. The coaxial needle tract was embolized with a small amount of Gel-Foam slurry and superficial hemostasis was obtained with manual compression. Post procedural scanning was negative for definitive area of hemorrhage or additional complication. A dressing was placed. The patient tolerated the procedure well without immediate post procedural complication. IMPRESSION: Technically successful ultrasound guided core needle biopsy of indeterminate mass within the right lobe of the liver. Electronically Signed   By: Sandi Mariscal M.D.   On: 09/30/2017 17:26   Dg Chest Port 1 View  Result Date: 10/04/2017 CLINICAL DATA:  Shortness of breath EXAM: PORTABLE CHEST 1 VIEW COMPARISON:  Portable exam 0936 hours compared to 09/27/2017  FINDINGS: Upper normal heart size. Atherosclerotic calcification aorta. Mediastinal contours and pulmonary vascularity normal. Lungs clear. No pleural effusion or pneumothorax. No acute osseous findings. IMPRESSION: No acute abnormalities. Electronically Signed   By: Lavonia Dana M.D.   On: 10/04/2017 11:38   Dg Chest Port 1 View  Result Date: 09/27/2017 CLINICAL DATA:  Weakness. EXAM: PORTABLE CHEST 1 VIEW COMPARISON:  None. FINDINGS: The heart size is borderline to mildly enlarged. The hila, mediastinum, lungs, and pleura are unremarkable. IMPRESSION: No active disease. Electronically Signed   By: Dorise Bullion III M.D   On: 09/27/2017 19:49   Ct Renal Stone Study  Result Date: 09/20/2017 CLINICAL DATA:  Acute onset of right-sided abdominal pain and hematuria. EXAM: CT ABDOMEN AND PELVIS WITHOUT CONTRAST TECHNIQUE: Multidetector CT imaging of the abdomen and pelvis was performed following the standard protocol without IV contrast. COMPARISON:  Abdominal ultrasound performed 08/04/2003  FINDINGS: Lower chest: Minimal atelectasis is noted at the lung bases. Scattered coronary artery calcifications are seen. Hepatobiliary: Multiple vague masses are seen within both hepatic lobes, measuring up to 3 cm in size, suspicious for metastatic disease to the liver. The gallbladder is decompressed and grossly unremarkable. The common bile duct is normal in caliber. Pancreas: The pancreas is within normal limits. Spleen: The spleen is unremarkable in appearance. Adrenals/Urinary Tract: The adrenal glands are unremarkable. A left renal cyst is noted. The kidneys are otherwise unremarkable in appearance. There is no evidence of hydronephrosis. No renal or ureteral stones are identified. No significant perinephric stranding is seen. Stomach/Bowel: The stomach is unremarkable in appearance. The small bowel is within normal limits. The appendix is not visualized; there is no evidence for appendicitis. The colon is  unremarkable in appearance. Vascular/Lymphatic: Scattered calcification is seen along the abdominal aorta and its branches. The abdominal aorta is otherwise grossly unremarkable. The inferior vena cava is grossly unremarkable. No retroperitoneal lymphadenopathy is seen. No pelvic sidewall lymphadenopathy is identified. Reproductive: The bladder is mildly distended and grossly unremarkable. The prostate remains normal in size. Other: No additional soft tissue abnormalities are seen. Musculoskeletal: No acute osseous abnormalities are identified. Vacuum phenomenon is noted at T11-T12. The visualized musculature is unremarkable in appearance. IMPRESSION: 1. Multiple vague masses within both hepatic lobes, measuring up to 3 cm in size, suspicious for metastatic disease to the liver. Would correlate with LFTs. PET/CT is recommended for further evaluation, to assess for underlying primary malignancy. 2. Scattered coronary artery calcifications seen. 3. Left renal cyst noted. Aortic Atherosclerosis (ICD10-I70.0). Electronically Signed   By: Garald Balding M.D.   On: 09/20/2017 22:46    Microbiology: No results found for this or any previous visit (from the past 240 hour(s)).   Labs: CBC: Recent Labs  Lab 10/04/17 0319 10/05/17 0802 10/06/17 0433 10/07/17 0531 10/08/17 0407  WBC 9.6 9.5 7.7 7.6 6.4  NEUTROABS 7.4  --   --   --   --   HGB 14.6 14.1 13.6 13.8 14.0  HCT 39.6 39.0 38.3* 38.1* 39.5  MCV 86.1 88.8 88.9 89.0 89.4  PLT 155 154 146* 144* 397   Basic Metabolic Panel: Recent Labs  Lab 10/04/17 0319 10/05/17 0802 10/06/17 0433 10/07/17 0531 10/08/17 0407  NA 126* 131* 129* 130* 128*  K 4.7 4.8 5.3* 5.4* 5.2*  CL 83* 86* 84* 89* 87*  CO2 32 33* 34* 29 29  GLUCOSE 200* 210* 373* 316* 293*  BUN 53* 53* 57* 56* 55*  CREATININE 1.30* 0.98 1.03 1.00 1.09  CALCIUM 8.8* 8.9 8.5* 8.4* 8.5*  MG 2.3  --   --   --   --    Liver Function Tests: Recent Labs  Lab 10/04/17 0319 10/05/17 0802  10/06/17 0433 10/07/17 0531 10/08/17 0407  AST 132* 106* 99* 101* 121*  ALT 140* 126* 118* 123* 139*  ALKPHOS 219* 223* 248* 237* 281*  BILITOT 12.7* 12.3* 11.9* 11.2* 11.8*  PROT 6.5 6.3* 6.0* 5.7* 6.0*  ALBUMIN 2.4* 2.5* 2.2* 2.2* 2.4*   No results for input(s): LIPASE, AMYLASE in the last 168 hours. No results for input(s): AMMONIA in the last 168 hours. Cardiac Enzymes: No results for input(s): CKTOTAL, CKMB, CKMBINDEX, TROPONINI in the last 168 hours. BNP (last 3 results) No results for input(s): BNP in the last 8760 hours. CBG: Recent Labs  Lab 10/07/17 0754 10/07/17 1201 10/07/17 1713 10/07/17 1938 10/08/17 0101  GLUCAP 294* 368* 294* 192* 290*  Time spent: 35 minutes  Signed:  Berle Mull  [facility]  10/08/2017  , 11:36 AM

## 2017-10-08 NOTE — Clinical Social Work Placement (Signed)
Pt discharged admitting to [facility] report [phone]. Will arrange [facility] transportation Daughter [name] aware and agreeable to [place] insurance authorization. DC information provided via the [place]  NOTE  Date:  10/08/2017  Patient Details  Name: Robert Compton MRN: [id number] Date of Birth: April 23, 1945  Clinical Social Work is seeking post-discharge placement for this patient at the [place] level of care (*CSW will initial, date and re-position this form in  chart as items are completed):  Yes   Patient/family provided with [place] Work Department's list of facilities offering this level of care within the geographic area requested by the patient (or if unable, by the patient's family).  Yes   Patient/family informed of their freedom to choose among providers that offer the needed level of care, that participate in Medicare, Medicaid or managed care program needed by the patient, have an available bed and are willing to accept the patient.  Yes   Patient/family informed of [facility]'s ownership interest in [facility] and [facility], as well as of the fact that they are under no obligation to receive care at these facilities.  PASRR submitted to EDS on 10/03/17     PASRR number received on 10/03/17     Existing PASRR number confirmed on       FL2 transmitted to all facilities in geographic area requested by pt/family on 10/03/17     FL2 transmitted to all facilities within larger geographic area on       Patient informed that his/her managed care company has contracts with or will negotiate with certain facilities, including the following:        Yes   Patient/family informed of bed offers received.  Patient chooses bed at [facility]     Physician recommends and patient chooses bed at [facility]    Patient to be transferred to [facility] on 10/08/17.  Patient to be  transferred to facility by [facility]     Patient family notified on 10/08/17 of transfer.  Name of family member notified:  daughter [facility]      PHYSICIAN       Additional Comment:    _______________________________________________ Nila Nephew, LCSW 10/08/2017, 12:05 PM

## 2017-10-08 NOTE — Progress Notes (Signed)
Second attempt to call report to [facility] with no response.

## 2017-10-08 NOTE — Progress Notes (Signed)
OT Cancellation Note  Patient Details Name: Robert Compton MRN: [id number] DOB: 12-04-1945   Cancelled Treatment:    Reason Eval/Treat Not Completed: Patient at procedure or test/ unavailable  Pt leaving for x ray - will check back on pt as schedule allows  Kari Baars, [name]  [name] D 10/08/2017, 9:48 AM

## 2017-10-09 ENCOUNTER — Inpatient Hospital Stay: Payer: BLUE CROSS/BLUE SHIELD | Attending: Internal Medicine

## 2017-10-09 ENCOUNTER — Telehealth: Payer: Self-pay | Admitting: Internal Medicine

## 2017-10-09 ENCOUNTER — Other Ambulatory Visit: Payer: Self-pay | Admitting: Internal Medicine

## 2017-10-09 DIAGNOSIS — C3411 Malignant neoplasm of upper lobe, right bronchus or lung: Secondary | ICD-10-CM

## 2017-10-09 DIAGNOSIS — Z7689 Persons encountering health services in other specified circumstances: Secondary | ICD-10-CM | POA: Diagnosis not present

## 2017-10-09 DIAGNOSIS — C787 Secondary malignant neoplasm of liver and intrahepatic bile duct: Secondary | ICD-10-CM | POA: Diagnosis not present

## 2017-10-09 DIAGNOSIS — C3491 Malignant neoplasm of unspecified part of right bronchus or lung: Secondary | ICD-10-CM | POA: Diagnosis present

## 2017-10-09 DIAGNOSIS — C3490 Malignant neoplasm of unspecified part of unspecified bronchus or lung: Secondary | ICD-10-CM

## 2017-10-09 MED ORDER — PEGFILGRASTIM-CBQV 6 MG/0.6ML ~~LOC~~ SOSY
6.0000 mg | PREFILLED_SYRINGE | Freq: Once | SUBCUTANEOUS | Status: AC
  Administered 2017-10-09: 6 mg via SUBCUTANEOUS

## 2017-10-09 NOTE — Patient Instructions (Signed)
Pegfilgrastim injection What is this medicine? PEGFILGRASTIM (PEG fil gra stim) is a long-acting granulocyte colony-stimulating factor that stimulates the growth of neutrophils, a type of white blood cell important in the body's fight against infection. It is used to reduce the incidence of fever and infection in patients with certain types of cancer who are receiving chemotherapy that affects the bone marrow, and to increase survival after being exposed to high doses of radiation. This medicine may be used for other purposes; ask your health care provider or pharmacist if you have questions. COMMON BRAND NAME(S): Neulasta What should I tell my health care provider before I take this medicine? They need to know if you have any of these conditions: -kidney disease -latex allergy -ongoing radiation therapy -sickle cell disease -skin reactions to acrylic adhesives (On-Body Injector only) -an unusual or allergic reaction to pegfilgrastim, filgrastim, other medicines, foods, dyes, or preservatives -pregnant or trying to get pregnant -breast-feeding How should I use this medicine? This medicine is for injection under the skin. If you get this medicine at home, you will be taught how to prepare and give the pre-filled syringe or how to use the On-body Injector. Refer to the patient Instructions for Use for detailed instructions. Use exactly as directed. Tell your healthcare provider immediately if you suspect that the On-body Injector may not have performed as intended or if you suspect the use of the On-body Injector resulted in a missed or partial dose. It is important that you put your used needles and syringes in a special sharps container. Do not put them in a trash can. If you do not have a sharps container, call your pharmacist or healthcare provider to get one. Talk to your pediatrician regarding the use of this medicine in children. While this drug may be prescribed for selected conditions,  precautions do apply. Overdosage: If you think you have taken too much of this medicine contact a poison control center or emergency room at once. NOTE: This medicine is only for you. Do not share this medicine with others. What if I miss a dose? It is important not to miss your dose. Call your doctor or health care professional if you miss your dose. If you miss a dose due to an On-body Injector failure or leakage, a new dose should be administered as soon as possible using a single prefilled syringe for manual use. What may interact with this medicine? Interactions have not been studied. Give your health care provider a list of all the medicines, herbs, non-prescription drugs, or dietary supplements you use. Also tell them if you smoke, drink alcohol, or use illegal drugs. Some items may interact with your medicine. This list may not describe all possible interactions. Give your health care provider a list of all the medicines, herbs, non-prescription drugs, or dietary supplements you use. Also tell them if you smoke, drink alcohol, or use illegal drugs. Some items may interact with your medicine. What should I watch for while using this medicine? You may need blood work done while you are taking this medicine. If you are going to need a MRI, CT scan, or other procedure, tell your doctor that you are using this medicine (On-Body Injector only). What side effects may I notice from receiving this medicine? Side effects that you should report to your doctor or health care professional as soon as possible: -allergic reactions like skin rash, itching or hives, swelling of the face, lips, or tongue -dizziness -fever -pain, redness, or irritation at site   where injected -pinpoint red spots on the skin -red or dark-brown urine -shortness of breath or breathing problems -stomach or side pain, or pain at the shoulder -swelling -tiredness -trouble passing urine or change in the amount of urine Side  effects that usually do not require medical attention (report to your doctor or health care professional if they continue or are bothersome): -bone pain -muscle pain This list may not describe all possible side effects. Call your doctor for medical advice about side effects. You may report side effects to FDA at 1-800-FDA-1088. Where should I keep my medicine? Keep out of the reach of children. Store pre-filled syringes in a refrigerator between 2 and 8 degrees C (36 and 46 degrees F). Do not freeze. Keep in carton to protect from light. Throw away this medicine if it is left out of the refrigerator for more than 48 hours. Throw away any unused medicine after the expiration date. NOTE: This sheet is a summary. It may not cover all possible information. If you have questions about this medicine, talk to your doctor, pharmacist, or health care provider.  2018 Elsevier/Gold Standard (2016-03-29 12:58:03)  

## 2017-10-09 NOTE — Telephone Encounter (Signed)
Scheduled appt per 6/26 sch message - pt is aware of inj appt today and son will come by scheduling to pick up schedule.

## 2017-10-09 NOTE — Telephone Encounter (Signed)
Called pt re adding injection and lab appt per 6/26 sch msg - left vm for pt to call back and set up appt.

## 2017-10-14 ENCOUNTER — Telehealth: Payer: Self-pay

## 2017-10-14 ENCOUNTER — Inpatient Hospital Stay: Payer: BLUE CROSS/BLUE SHIELD

## 2017-10-14 NOTE — Telephone Encounter (Signed)
Ok. Thank you.

## 2017-10-14 NOTE — Telephone Encounter (Signed)
Patient's daughter called stating the patient is in [facility] and [place] and wants to know if he can have blood work drawn there.  I called her back left voice message stating that is fine and if I need to call the rehab facility to coordinate to let me know.

## 2017-10-14 NOTE — Telephone Encounter (Signed)
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 Regan Rakers is seeing this pt in a few weeks. Thanks   Truitt Merle MD

## 2017-10-16 ENCOUNTER — Non-Acute Institutional Stay: Payer: Self-pay | Admitting: Hospice and Palliative Medicine

## 2017-10-16 DIAGNOSIS — Z515 Encounter for palliative care: Secondary | ICD-10-CM

## 2017-10-16 NOTE — Progress Notes (Signed)
PALLIATIVE CARE CONSULT VISIT   PATIENT NAME: Robert Compton DOB: May 31, 1945 MRN: [id number]  PRIMARY CARE PROVIDER:   [name], MD  REFERRING PROVIDER:  Dr. Aubery Lapping  RESPONSIBLE PARTY:  Self   ASSESSMENT:     Introduced self to patient and explained palliative care services. He feels that rehab is going well and he has been ambulatory with PT. His plan is to return home, where he lives with his son. Prior to this hospitalization, patient was still working as a Administrator and was completely independent.   He seems optimistic regarding cancer treatment and prognosis. However, he did say that family have looked into hospice services at home and we talked about that option. Patient would not currently be eligible for hospice care given his desire to continue treatment.   Spoke with facility NP about case and plan.  RECOMMENDATIONS and PLAN:  1. Continue supportive care 2. Will speak with patient further about goals/code status  I spent 30 minutes providing this consultation,  from 1230 to 1300. More than 50% of the time in this consultation was spent coordinating communication.   HISTORY OF PRESENT ILLNESS:  Robert Compton is a 72 y.o. year old male with multiple medical problems including DM, HTN, who was hospitalized 09/27/17 to 10/08/17 with abdominal distension and was found to have probable lung cancer with liver metasasis. Patient was discharged to rehab and Palliative Care was asked to help address goals of care.   CODE STATUS: FC  PPS: 40% HOSPICE ELIGIBILITY/DIAGNOSIS: TBD  PAST MEDICAL HISTORY:  Past Medical History:  Diagnosis Date  . Arthritis   . Cancer ([place])   . Diabetes mellitus without complication ([place])   . Hypertension     SOCIAL HX:  Social History   Tobacco Use  . Smoking status: Current Every Day Smoker    Packs/day: 1.50    Years: 8.00    Pack years: 12.00    Types: Cigarettes  . Smokeless tobacco: Never Used  Substance Use Topics  .  Alcohol use: No    ALLERGIES:  Allergies  Allergen Reactions  . Amoxicillin-Pot Clavulanate Anaphylaxis  . Nitrofurantoin Anaphylaxis     PERTINENT MEDICATIONS:  Outpatient Encounter Medications as of 10/16/2017  Medication Sig  . aspirin 81 MG tablet Take 81 mg by mouth 2 (two) times daily.  Marland Kitchen demeclocycline (DECLOMYCIN) 150 MG tablet Take 2 tablets (300 mg total) by mouth 2 (two) times daily.  . feeding supplement, GLUCERNA SHAKE, (GLUCERNA SHAKE) LIQD Take 237 mLs by mouth 2 (two) times daily between meals.  . fish oil-omega-3 fatty acids 1000 MG capsule Take 1 g by mouth 2 (two) times daily.  . fluticasone (FLONASE) 50 MCG/ACT nasal spray Place 1 spray into both nostrils daily as needed for allergies.  . furosemide (LASIX) 40 MG tablet Take 1 tablet (40 mg total) by mouth 2 (two) times daily.  Marland Kitchen HYDROcodone-acetaminophen (HYCET) 7.5-325 mg/15 ml solution Take 10 mLs by mouth every 12 (twelve) hours as needed (COUGH).  Marland Kitchen insulin aspart (NOVOLOG) 100 UNIT/ML injection CBG < 70: implement hypoglycemia protocol CBG 70 - 120: 0 units CBG 121 - 150: 2 units CBG 151 - 200: 3 units CBG 201 - 250: 5 units CBG 251 - 300: 8 units CBG 301 - 350: 11 units CBG 351 - 400: 15 units CBG > 400: call MD  . ipratropium-albuterol (DUONEB) 0.5-2.5 (3) MG/3ML SOLN Take 3 mLs by nebulization every 6 (six) hours as needed.  . magic mouthwash w/lidocaine  SOLN Take 5 mLs by mouth 4 (four) times daily.  . metFORMIN (GLUCOPHAGE) 1000 MG tablet Take 1,000 mg by mouth 2 (two) times daily with a meal.  . methocarbamol (ROBAXIN) 500 MG tablet Take 1 tablet (500 mg total) by mouth 3 (three) times daily.  Marland Kitchen mouth rinse LIQD solution 15 mLs by Mouth Rinse route 2 (two) times daily.  . nicotine (NICODERM CQ - DOSED IN MG/24 HOURS) 21 mg/24hr patch Place 1 patch (21 mg total) onto the skin daily.  . ondansetron (ZOFRAN ODT) 4 MG disintegrating tablet 4mg  ODT q4 hours prn nausea/vomit  . polyethylene glycol (MIRALAX /  GLYCOLAX) packet Take 17 g by mouth daily.  . ranitidine (ZANTAC) 150 MG tablet Take 150 mg by mouth 2 (two) times daily. For 14 doses  . Red Yeast Rice 600 MG TABS Take 1 tablet by mouth daily.  . traMADol (ULTRAM) 50 MG tablet Take 1 tablet (50 mg total) by mouth every 6 (six) hours as needed for moderate pain.   No facility-administered encounter medications on file as of 10/16/2017.     PHYSICAL EXAM:   General: NAD, frail appearing, obese Cardiovascular: regular rate and rhythm Pulmonary: clear ant fields Abdomen: soft, nontender, + bowel sounds GU: no suprapubic tenderness Extremities: trace edema, no joint deformities Skin: no rashes, jaundiced Neurological: Weakness but otherwise nonfocal  Irean Hong, NP

## 2017-10-21 ENCOUNTER — Inpatient Hospital Stay: Payer: BLUE CROSS/BLUE SHIELD | Attending: Internal Medicine

## 2017-10-21 ENCOUNTER — Encounter (HOSPITAL_COMMUNITY)
Admission: RE | Admit: 2017-10-21 | Discharge: 2017-10-21 | Disposition: A | Discharge status: Home / Self Care | Payer: BLUE CROSS/BLUE SHIELD | Source: Ambulatory Visit | Attending: Internal Medicine | Admitting: Internal Medicine

## 2017-10-21 ENCOUNTER — Inpatient Hospital Stay: Payer: BLUE CROSS/BLUE SHIELD

## 2017-10-21 ENCOUNTER — Non-Acute Institutional Stay: Payer: Self-pay | Admitting: Hospice and Palliative Medicine

## 2017-10-21 DIAGNOSIS — D6959 Other secondary thrombocytopenia: Secondary | ICD-10-CM | POA: Insufficient documentation

## 2017-10-21 DIAGNOSIS — R42 Dizziness and giddiness: Secondary | ICD-10-CM | POA: Diagnosis not present

## 2017-10-21 DIAGNOSIS — D6481 Anemia due to antineoplastic chemotherapy: Secondary | ICD-10-CM | POA: Diagnosis not present

## 2017-10-21 DIAGNOSIS — R0609 Other forms of dyspnea: Secondary | ICD-10-CM | POA: Insufficient documentation

## 2017-10-21 DIAGNOSIS — Z794 Long term (current) use of insulin: Secondary | ICD-10-CM | POA: Insufficient documentation

## 2017-10-21 DIAGNOSIS — M549 Dorsalgia, unspecified: Secondary | ICD-10-CM | POA: Diagnosis not present

## 2017-10-21 DIAGNOSIS — R17 Unspecified jaundice: Secondary | ICD-10-CM | POA: Insufficient documentation

## 2017-10-21 DIAGNOSIS — R400 Somnolence: Secondary | ICD-10-CM | POA: Insufficient documentation

## 2017-10-21 DIAGNOSIS — R5081 Fever presenting with conditions classified elsewhere: Secondary | ICD-10-CM | POA: Diagnosis not present

## 2017-10-21 DIAGNOSIS — R634 Abnormal weight loss: Secondary | ICD-10-CM | POA: Diagnosis not present

## 2017-10-21 DIAGNOSIS — I10 Essential (primary) hypertension: Secondary | ICD-10-CM | POA: Diagnosis not present

## 2017-10-21 DIAGNOSIS — C787 Secondary malignant neoplasm of liver and intrahepatic bile duct: Secondary | ICD-10-CM | POA: Diagnosis not present

## 2017-10-21 DIAGNOSIS — T451X5S Adverse effect of antineoplastic and immunosuppressive drugs, sequela: Secondary | ICD-10-CM | POA: Insufficient documentation

## 2017-10-21 DIAGNOSIS — R740 Nonspecific elevation of levels of transaminase and lactic acid dehydrogenase [LDH]: Secondary | ICD-10-CM | POA: Insufficient documentation

## 2017-10-21 DIAGNOSIS — C3411 Malignant neoplasm of upper lobe, right bronchus or lung: Secondary | ICD-10-CM

## 2017-10-21 DIAGNOSIS — Z5111 Encounter for antineoplastic chemotherapy: Secondary | ICD-10-CM | POA: Insufficient documentation

## 2017-10-21 DIAGNOSIS — Z79899 Other long term (current) drug therapy: Secondary | ICD-10-CM | POA: Insufficient documentation

## 2017-10-21 DIAGNOSIS — G629 Polyneuropathy, unspecified: Secondary | ICD-10-CM | POA: Insufficient documentation

## 2017-10-21 DIAGNOSIS — F17210 Nicotine dependence, cigarettes, uncomplicated: Secondary | ICD-10-CM | POA: Insufficient documentation

## 2017-10-21 DIAGNOSIS — R5383 Other fatigue: Secondary | ICD-10-CM | POA: Diagnosis not present

## 2017-10-21 DIAGNOSIS — M1990 Unspecified osteoarthritis, unspecified site: Secondary | ICD-10-CM | POA: Insufficient documentation

## 2017-10-21 DIAGNOSIS — R0602 Shortness of breath: Secondary | ICD-10-CM | POA: Diagnosis not present

## 2017-10-21 DIAGNOSIS — E119 Type 2 diabetes mellitus without complications: Secondary | ICD-10-CM | POA: Insufficient documentation

## 2017-10-21 DIAGNOSIS — Z515 Encounter for palliative care: Secondary | ICD-10-CM

## 2017-10-21 DIAGNOSIS — R630 Anorexia: Secondary | ICD-10-CM | POA: Insufficient documentation

## 2017-10-21 DIAGNOSIS — I700 Atherosclerosis of aorta: Secondary | ICD-10-CM | POA: Insufficient documentation

## 2017-10-21 DIAGNOSIS — D701 Agranulocytosis secondary to cancer chemotherapy: Secondary | ICD-10-CM | POA: Diagnosis not present

## 2017-10-21 DIAGNOSIS — C3491 Malignant neoplasm of unspecified part of right bronchus or lung: Secondary | ICD-10-CM | POA: Insufficient documentation

## 2017-10-21 DIAGNOSIS — G8929 Other chronic pain: Secondary | ICD-10-CM | POA: Insufficient documentation

## 2017-10-21 DIAGNOSIS — Z7982 Long term (current) use of aspirin: Secondary | ICD-10-CM | POA: Insufficient documentation

## 2017-10-21 LAB — CMP (CANCER CENTER ONLY)
ALT: 63 U/L — ABNORMAL HIGH (ref 0–44)
AST: 55 U/L — AB (ref 15–41)
Albumin: 2.5 g/dL — ABNORMAL LOW (ref 3.5–5.0)
Alkaline Phosphatase: 209 U/L — ABNORMAL HIGH (ref 38–126)
Anion gap: 8 (ref 5–15)
BILIRUBIN TOTAL: 5.9 mg/dL — AB (ref 0.3–1.2)
BUN: 16 mg/dL (ref 8–23)
CHLORIDE: 98 mmol/L (ref 98–111)
CO2: 30 mmol/L (ref 22–32)
Calcium: 9.1 mg/dL (ref 8.9–10.3)
Creatinine: 1.24 mg/dL (ref 0.61–1.24)
GFR, Est AFR Am: >60 mL/min (ref >60)
GFR, Estimated: 57 mL/min — ABNORMAL LOW (ref >60)
GLUCOSE: 113 mg/dL — AB (ref 70–99)
POTASSIUM: 3.8 mmol/L (ref 3.5–5.1)
Sodium: 136 mmol/L (ref 135–145)
TOTAL PROTEIN: 6.3 g/dL — AB (ref 6.5–8.1)

## 2017-10-21 LAB — CBC WITH DIFFERENTIAL (CANCER CENTER ONLY)
Basophils Absolute: 0.1 10*3/uL (ref 0.0–0.1)
Basophils Relative: 1 %
EOS PCT: 0 %
Eosinophils Absolute: 0 10*3/uL (ref 0.0–0.5)
HCT: 31.4 % — ABNORMAL LOW (ref 38.4–49.9)
Hemoglobin: 10.7 g/dL — ABNORMAL LOW (ref 13.0–17.1)
LYMPHS ABS: 1.4 10*3/uL (ref 0.9–3.3)
LYMPHS PCT: 12 %
MCH: 30.9 pg (ref 27.2–33.4)
MCHC: 33.9 g/dL (ref 32.0–36.0)
MCV: 91.1 fL (ref 79.3–98.0)
MONO ABS: 1.2 10*3/uL — AB (ref 0.1–0.9)
Monocytes Relative: 10 %
Neutro Abs: 9.7 10*3/uL — ABNORMAL HIGH (ref 1.5–6.5)
Neutrophils Relative %: 77 %
PLATELETS: 178 10*3/uL (ref 140–400)
RBC: 3.45 MIL/uL — ABNORMAL LOW (ref 4.20–5.82)
RDW: 16.1 % — AB (ref 11.0–14.6)
WBC Count: 12.5 10*3/uL — ABNORMAL HIGH (ref 4.0–10.3)

## 2017-10-21 LAB — GLUCOSE, CAPILLARY: Glucose-Capillary: 113 mg/dL — ABNORMAL HIGH (ref 70–99)

## 2017-10-21 MED ORDER — FLUDEOXYGLUCOSE F - 18 (FDG) INJECTION
15.0300 | Freq: Once | INTRAVENOUS | Status: AC | PRN
  Administered 2017-10-21: 15.03 via INTRAVENOUS

## 2017-10-22 NOTE — Progress Notes (Signed)
Visit attempted. I spoke briefly with patient but he was leaving the facility for an outpatient appointment. Will follow up at a later date.

## 2017-10-23 ENCOUNTER — Ambulatory Visit (HOSPITAL_COMMUNITY)
Admission: RE | Admit: 2017-10-23 | Discharge: 2017-10-23 | Disposition: A | Discharge status: Home / Self Care | Payer: BLUE CROSS/BLUE SHIELD | Source: Ambulatory Visit | Attending: Internal Medicine | Admitting: Internal Medicine

## 2017-10-23 ENCOUNTER — Other Ambulatory Visit: Payer: Self-pay | Admitting: Medical Oncology

## 2017-10-23 DIAGNOSIS — C3411 Malignant neoplasm of upper lobe, right bronchus or lung: Secondary | ICD-10-CM

## 2017-10-23 DIAGNOSIS — I6782 Cerebral ischemia: Secondary | ICD-10-CM | POA: Insufficient documentation

## 2017-10-23 DIAGNOSIS — M899 Disorder of bone, unspecified: Secondary | ICD-10-CM | POA: Diagnosis not present

## 2017-10-23 MED ORDER — GADOBENATE DIMEGLUMINE 529 MG/ML IV SOLN
20.0000 mL | Freq: Once | INTRAVENOUS | Status: AC | PRN
  Administered 2017-10-23: 20 mL via INTRAVENOUS

## 2017-10-25 ENCOUNTER — Encounter: Payer: Self-pay | Admitting: Pharmacist

## 2017-10-25 ENCOUNTER — Other Ambulatory Visit: Payer: Self-pay

## 2017-10-25 DIAGNOSIS — C3411 Malignant neoplasm of upper lobe, right bronchus or lung: Secondary | ICD-10-CM

## 2017-10-25 MED ORDER — PROCHLORPERAZINE MALEATE 10 MG PO TABS: 10.0000 mg | ORAL_TABLET | Freq: Four times a day (QID) | ORAL | 2 refills | Status: DC | PRN

## 2017-10-28 NOTE — Progress Notes (Addendum)
[place]  [phone] [phone]  Clinic Follow up Note   Patient Care Team: [name], MD as PCP - General (Internal Medicine) 10/29/2017  SUMMARY OF ONCOLOGIC HISTORY:   Extensive stage primary small cell carcinoma of lung ([place])   10/03/2017 Initial Diagnosis    Small cell lung carcinoma, right ([place])      10/04/2017 -  Chemotherapy    The patient had palonosetron (ALOXI) injection 0.25 mg, 0.25 mg, Intravenous,  Once, 2 of 6 cycles Administration: 0.25 mg (10/04/2017) pegfilgrastim-cbqv (UDENYCA) injection 6 mg, 6 mg, Subcutaneous, Once, 2 of 6 cycles Administration: 6 mg (10/09/2017) CARBOplatin (PARAPLATIN) 520 mg in sodium chloride 0.9 % 250 mL chemo infusion, 520.4 mg (89.1 % of original dose 584 mg), Intravenous,  Once, 2 of 6 cycles Dose modification: 584 mg (original dose 584 mg, Cycle 1), 520.4 mg (original dose 584 mg, Cycle 1, Reason: Change in SCr/CrCl), 402 mg (original dose 402 mg, Cycle 2) Administration: 520 mg (10/04/2017) etoposide (VEPESID) 140 mg in sodium chloride 0.9 % 500 mL chemo infusion, 50 mg/m2 = 140 mg (66.7 % of original dose 75 mg/m2), Intravenous,  Once, 2 of 6 cycles Dose modification: 75 mg/m2 (original dose 75 mg/m2, Cycle 1, Reason: Provider Judgment), 50 mg/m2 (original dose 75 mg/m2, Cycle 1, Reason: Provider Judgment), 90 mg/m2 (original dose 75 mg/m2, Cycle 2, Reason: Provider Judgment) Administration: 140 mg (10/04/2017), 140 mg (10/05/2017), 140 mg (10/06/2017) fosaprepitant (EMEND) 150 mg, dexamethasone (DECADRON) 12 mg in sodium chloride 0.9 % 145 mL IVPB, , Intravenous,  Once, 1 of 5 cycles  for chemotherapy treatment.      DIAGNOSIS: Extensive stage small cell lung cancer, confirmed on liver core biopsy 09/30/17   PRIOR THERAPY: None   CURRENT THERAPY: systemic Carboplatin AUC 4 on day 1 and etoposide 50 mg/m2 on days 1-3 with GCSF support; s/p cycle 1 during hospitalization on 10/04/17  INTERVAL  HISTORY: Mr. Swanner is here for first f/u since hospital discharge and diagnosis. He was evaluated and followed by [name] during hospitalization. He began systemic chemotherapy with carboplatin and etoposide during hospital stay, which he completed from 6/21 - 6/23. He was discharged to [facility] on 10/08/17. He received Udenyca at [facility] on 10/09/17. Day after injectio he developed flu-like aches with generalized bone pain, fatigue, and nausea which lasted 3 days. He took claritin which did not help. Otherwise he denies n/v/c/d, fever or chills. Occasional cough with white phlegm is stable. Denies chest pain, dyspnea, hemoptysis, or wheezing. Appetite is present but he has lost weight, he attributes to bad food at SNF. He remains ambulatory at home with walker, he anticipates starting PT at home soon. Dark urine and juandice is improving. Chronic back pain is stable. He seldom takes pain medication. Pre-existing neuropathy to feet is stable.   REVIEW OF SYSTEMS:   Constitutional: Denies fevers, chills or abnormal weight loss (+) increasing activity level (+) weight loss Eyes: Denies blurriness of vision Ears, nose, mouth, throat, and face: Denies mucositis or sore throat Respiratory: Denies hemoptysis, dyspnea or wheezes (+) productive cough with white phlegm, stable Cardiovascular: Denies palpitation, chest discomfort (+) intermittent lower extremity swelling Gastrointestinal:  Denies nausea, vomiting, constipation, diarrhea, heartburn or change in bowel habits GU: (+) Dark urine, improving Skin: Denies abnormal skin rashes (+) jaundice Lymphatics: Denies new lymphadenopathy or easy bruising Neurological:Denies headache, vision change, or new weaknesses (+) neuropathy secondary to DM, stable Behavioral/Psych: Mood is stable, no new changes  MSK: (+)  Chronic back pain (+) moderate-severe bone pain with Neulasta, lasting 3 days All other systems were reviewed with the patient and are  negative.  MEDICAL HISTORY:  Past Medical History:  Diagnosis Date  . Arthritis   . Cancer ([place])   . Diabetes mellitus without complication ([place])   . Hypertension     SURGICAL HISTORY: Past Surgical History:  Procedure Laterality Date  . JOINT REPLACEMENT Right    knee    I have reviewed the social history and family history with the patient and they are unchanged from previous note.  ALLERGIES:  is allergic to amoxicillin-pot clavulanate and nitrofurantoin.  MEDICATIONS:  Current Outpatient Medications  Medication Sig Dispense Refill  . aspirin 81 MG tablet Take 81 mg by mouth 2 (two) times daily.    Marland Kitchen demeclocycline (DECLOMYCIN) 150 MG tablet Take 2 tablets (300 mg total) by mouth 2 (two) times daily. 60 tablet 0  . feeding supplement, GLUCERNA SHAKE, (GLUCERNA SHAKE) LIQD Take 237 mLs by mouth 2 (two) times daily between meals. 90 Can 0  . fish oil-omega-3 fatty acids 1000 MG capsule Take 1 g by mouth 2 (two) times daily.    . fluticasone (FLONASE) 50 MCG/ACT nasal spray Place 1 spray into both nostrils daily as needed for allergies.  11  . furosemide (LASIX) 40 MG tablet Take 1 tablet (40 mg total) by mouth 2 (two) times daily. 30 tablet 0  . HYDROcodone-acetaminophen (HYCET) 7.5-325 mg/15 ml solution Take 10 mLs by mouth every 12 (twelve) hours as needed (COUGH). 60 mL 0  . Melatonin 3 MG CAPS Take by mouth.    . metFORMIN (GLUCOPHAGE) 1000 MG tablet Take 1,000 mg by mouth 2 (two) times daily with a meal.    . methocarbamol (ROBAXIN) 500 MG tablet Take 1 tablet (500 mg total) by mouth 3 (three) times daily. 30 tablet 0  . nicotine (NICODERM CQ - DOSED IN MG/24 HOURS) 21 mg/24hr patch Place 1 patch (21 mg total) onto the skin daily. 28 patch 0  . omeprazole (PRILOSEC) 20 MG capsule Take 20 mg by mouth daily.    . ondansetron (ZOFRAN ODT) 4 MG disintegrating tablet 4mg  ODT q4 hours prn nausea/vomit 20 tablet 0  . polyethylene glycol (MIRALAX / GLYCOLAX) packet Take 17 g by  mouth daily. 14 each 0  . prochlorperazine (COMPAZINE) 10 MG tablet Take 1 tablet (10 mg total) by mouth every 6 (six) hours as needed for nausea or vomiting. 30 tablet 2  . Red Yeast Rice 600 MG TABS Take 1 tablet by mouth daily.    . traMADol (ULTRAM) 50 MG tablet Take 1 tablet (50 mg total) by mouth every 6 (six) hours as needed for moderate pain. 20 tablet 0  . insulin aspart (NOVOLOG) 100 UNIT/ML injection CBG < 70: implement hypoglycemia protocol CBG 70 - 120: 0 units CBG 121 - 150: 2 units CBG 151 - 200: 3 units CBG 201 - 250: 5 units CBG 251 - 300: 8 units CBG 301 - 350: 11 units CBG 351 - 400: 15 units CBG > 400: call MD 10 mL 11  . ipratropium-albuterol (DUONEB) 0.5-2.5 (3) MG/3ML SOLN Take 3 mLs by nebulization every 6 (six) hours as needed. (Patient not taking: Reported on 10/29/2017) 360 mL 0  . magic mouthwash w/lidocaine SOLN Take 5 mLs by mouth 4 (four) times daily. (Patient not taking: Reported on 10/29/2017) 300 mL 0  . mouth rinse LIQD solution 15 mLs by Mouth Rinse route 2 (two) times  daily. (Patient not taking: Reported on 10/29/2017) 100 mL 0  . ranitidine (ZANTAC) 150 MG tablet Take 150 mg by mouth 2 (two) times daily. For 14 doses     No current facility-administered medications for this visit.    Facility-Administered Medications Ordered in Other Visits  Medication Dose Route Frequency Provider Last Rate Last Dose  . etoposide (VEPESID) 250 mg in sodium chloride 0.9 % 1,000 mL chemo infusion  90 mg/m2 (Treatment Plan Recorded) Intravenous Once Curt Bears, MD 1,013 mL/hr at 10/29/17 1544 250 mg at 10/29/17 1544    PHYSICAL EXAMINATION: ECOG PERFORMANCE STATUS: 2 - Symptomatic, <50% confined to bed  Vitals:   10/29/17 1121  BP: 133/71  Pulse: (!) 105  Resp: 19  Temp: 98 F (36.7 C)  SpO2: 96%   Filed Weights   10/29/17 1121  Weight: 267 lb 8 oz (121.3 kg)    GENERAL:alert, no distress and comfortable SKIN: no rashes or significant lesions, jaundice EYES:  scleral icterus OROPHARYNX:no thrush or ulcers LYMPH:  no palpable cervical or supraclavicular  LUNGS: clear to auscultation with normal breathing effort HEART: regular rate & rhythm and no murmurs and no lower extremity edema.  Venous stasis changes to lower extremities bilaterally ABDOMEN:abdomen soft with normal bowel sounds, hepatomegaly with mild tenderness to palpation Musculoskeletal: no cyanosis of digits and no clubbing  NEURO: alert & oriented x 3 with fluent speech, no focal motor/sensory deficits  LABORATORY DATA:  I have reviewed the data as listed CBC Latest Ref Rng & Units 10/29/2017 10/21/2017 10/08/2017  WBC 4.0 - 10.3 K/uL 11.5(H) 12.5(H) 6.4  Hemoglobin 13.0 - 17.1 g/dL 11.3(L) 10.7(L) 14.0  Hematocrit 38.4 - 49.9 % 32.8(L) 31.4(L) 39.5  Platelets 140 - 400 K/uL 209 178 152     CMP Latest Ref Rng & Units 10/29/2017 10/21/2017 10/08/2017  Glucose 70 - 99 mg/dL 203(H) 113(H) 293(H)  BUN 8 - 23 mg/dL 20 16 55(H)  Creatinine 0.61 - 1.24 mg/dL 1.81(H) 1.24 1.09  Sodium 135 - 145 mmol/L 136 136 128(L)  Potassium 3.5 - 5.1 mmol/L 3.6 3.8 5.2(H)  Chloride 98 - 111 mmol/L 95(L) 98 87(L)  CO2 22 - 32 mmol/L 28 30 29   Calcium 8.9 - 10.3 mg/dL 8.9 9.1 8.5(L)  Total Protein 6.5 - 8.1 g/dL 6.5 6.3(L) 6.0(L)  Total Bilirubin 0.3 - 1.2 mg/dL 7.6(HH) 5.9(HH) 11.8(H)  Alkaline Phos 38 - 126 U/L 208(H) 209(H) 281(H)  AST 15 - 41 U/L 56(H) 55(H) 121(H)  ALT 0 - 44 U/L 49(H) 63(H) 139(H)      RADIOGRAPHIC STUDIES: I have personally reviewed the radiological images as listed and agreed with the findings in the report. No results found.   ASSESSMENT & PLAN: Robert Compton is a 72 year old white male with extensive stage small cell lung cancer.  S/P cycle 1 dose-reduced carboplatin with etoposide during hospitalization.  He tolerated cycle 1 moderately well overall.  He experienced moderate side effects of G-CSF which resolved after 3 days. He has significant weight loss, possibly related  to diuretic and decreased po intake. I recommend he add nutrition supplement like glucerna to maintain weight. I recommend he hold lasix for now, given elevated creatinine and no significant leg edema.   Labs reviewed, he had significant transaminitis and hyperbilirubinemia during hospitalization, due to underlying malignancy. Clinical juandice is improving. LFTs abd bili remain elevated but improved today. He developed hyponatrimia, likely related to SIADH from small cell lung cancer. Na normalized today. Labs overall adequate to proceed with  cycle 2 chemotherapy; [name] to increased doses given overall good tolerance and improved performance status. Will check labs weekly on therapy and f/u in 3 weeks prior to cycle 3.   The patient was seen with [name] who reviewed staging PET and brain MRI which is negative for intracranial metastasis; however there are enhancing lesions of the upper cervical spine and calvarium compatible with osseous metastasis. He plans to restage after 2nd cycle chemotherapy. CT CAP ordered today.   He and his daughter have questions about his medication list. He cancelled f/u with PCP today, I encouraged him to f/u this week if possible. They agree.   PLAN: -Labs reviewed, proceed with cycle 2 Carboplatin AUC 4 and etoposide 90 mg/m2, Udenyca on day 5 -Restage after cycle 2, CT CAP ordered today -F/u with PCP -Hold lasix  -Begin nutrition supplement for weight loss -F/u in 3 weeks with restaging and eval for cycle 3   Orders Placed This Encounter  Procedures  . CT Abdomen Pelvis W Contrast    Standing Status:   Future    Standing Expiration Date:   10/29/2018    Order Specific Question:   If indicated for the ordered procedure, I authorize the administration of contrast media per Radiology protocol    Answer:   Yes    Order Specific Question:   Preferred imaging location?    Answer:   [facility]    Order Specific Question:   Is Oral Contrast  requested for this exam?    Answer:   Yes, Per Radiology protocol    Order Specific Question:   Radiology Contrast Protocol - do NOT remove file path    Answer:   \\[email or web address]  . CT Chest W Contrast    Standing Status:   Future    Standing Expiration Date:   10/29/2018    Order Specific Question:   If indicated for the ordered procedure, I authorize the administration of contrast media per Radiology protocol    Answer:   Yes    Order Specific Question:   Preferred imaging location?    Answer:   [facility]    Order Specific Question:   Radiology Contrast Protocol - do NOT remove file path    Answer:   \\[email or web address]   All questions were answered. The patient knows to call the clinic with any problems, questions or concerns. No barriers to learning was detected.     Alla Feeling, NP 10/29/17    ADDENDUM:  Hematology/Oncology Attending: I had a face-to-face encounter with the patient.  I recommended his care plan.  This is a very pleasant 72 years old white male recently diagnosed with extensive stage small cell lung cancer status post 1 cycle of systemic chemotherapy with reduced dose carboplatin and etoposide during his hospitalization.  He tolerated the first cycle of his treatment well with no concerning complaints.  He continues to have jaundice secondary to liver metastasis.  The patient had MRI of the brain as well as PET scan performed recently to complete the staging work-up of his disease.  His MRI showed no evidence of intra-parenchymal metastatic disease to the brain.  The PET scan which was performed after the first cycle of his treatment showed significant improvement in his disease within 1-2 weeks of the first cycle.  I personally and independently reviewed the scan results with the patient and his daughter. I recommended for the patient to proceed with cycle #2 today.  He will continue  on a dose reduction  of carboplatin for AUC of 4 and etoposide 90 mg/M2. The patient will come back for follow-up visit in 3 weeks for evaluation before starting cycle #3.  He will have repeat CT scan of the chest, abdomen and pelvis performed at that time. He was advised to call immediately if he has any concerning symptoms in the interval. Disclaimer: This note was dictated with voice recognition software. Similar sounding words can inadvertently be transcribed and may be missed upon review. Eilleen Kempf, MD 10/30/17

## 2017-10-29 ENCOUNTER — Inpatient Hospital Stay (HOSPITAL_BASED_OUTPATIENT_CLINIC_OR_DEPARTMENT_OTHER): Payer: BLUE CROSS/BLUE SHIELD | Admitting: Nurse Practitioner

## 2017-10-29 ENCOUNTER — Inpatient Hospital Stay: Payer: BLUE CROSS/BLUE SHIELD

## 2017-10-29 ENCOUNTER — Encounter: Payer: Self-pay | Admitting: Nurse Practitioner

## 2017-10-29 VITALS — HR 98

## 2017-10-29 VITALS — BP 133/71 | HR 105 | Temp 98.0°F | Resp 19 | Ht 75.0 in | Wt 267.5 lb

## 2017-10-29 DIAGNOSIS — M1990 Unspecified osteoarthritis, unspecified site: Secondary | ICD-10-CM

## 2017-10-29 DIAGNOSIS — C3490 Malignant neoplasm of unspecified part of unspecified bronchus or lung: Secondary | ICD-10-CM

## 2017-10-29 DIAGNOSIS — Z79899 Other long term (current) drug therapy: Secondary | ICD-10-CM

## 2017-10-29 DIAGNOSIS — G8929 Other chronic pain: Secondary | ICD-10-CM

## 2017-10-29 DIAGNOSIS — R740 Nonspecific elevation of levels of transaminase and lactic acid dehydrogenase [LDH]: Secondary | ICD-10-CM | POA: Diagnosis not present

## 2017-10-29 DIAGNOSIS — M549 Dorsalgia, unspecified: Secondary | ICD-10-CM

## 2017-10-29 DIAGNOSIS — E119 Type 2 diabetes mellitus without complications: Secondary | ICD-10-CM

## 2017-10-29 DIAGNOSIS — C3411 Malignant neoplasm of upper lobe, right bronchus or lung: Secondary | ICD-10-CM

## 2017-10-29 DIAGNOSIS — G629 Polyneuropathy, unspecified: Secondary | ICD-10-CM

## 2017-10-29 DIAGNOSIS — C787 Secondary malignant neoplasm of liver and intrahepatic bile duct: Secondary | ICD-10-CM | POA: Diagnosis not present

## 2017-10-29 DIAGNOSIS — R17 Unspecified jaundice: Secondary | ICD-10-CM

## 2017-10-29 DIAGNOSIS — Z794 Long term (current) use of insulin: Secondary | ICD-10-CM

## 2017-10-29 DIAGNOSIS — R634 Abnormal weight loss: Secondary | ICD-10-CM

## 2017-10-29 DIAGNOSIS — Z7982 Long term (current) use of aspirin: Secondary | ICD-10-CM

## 2017-10-29 DIAGNOSIS — I10 Essential (primary) hypertension: Secondary | ICD-10-CM

## 2017-10-29 DIAGNOSIS — C3491 Malignant neoplasm of unspecified part of right bronchus or lung: Secondary | ICD-10-CM

## 2017-10-29 LAB — CMP (CANCER CENTER ONLY)
ALBUMIN: 2.4 g/dL — AB (ref 3.5–5.0)
ALK PHOS: 208 U/L — AB (ref 38–126)
ALT: 49 U/L — ABNORMAL HIGH (ref 0–44)
ANION GAP: 13 (ref 5–15)
AST: 56 U/L — ABNORMAL HIGH (ref 15–41)
BILIRUBIN TOTAL: 7.6 mg/dL — AB (ref 0.3–1.2)
BUN: 20 mg/dL (ref 8–23)
CALCIUM: 8.9 mg/dL (ref 8.9–10.3)
CO2: 28 mmol/L (ref 22–32)
Chloride: 95 mmol/L — ABNORMAL LOW (ref 98–111)
Creatinine: 1.81 mg/dL — ABNORMAL HIGH (ref 0.61–1.24)
GFR, EST AFRICAN AMERICAN: 42 mL/min — AB (ref >60)
GFR, Estimated: 36 mL/min — ABNORMAL LOW (ref >60)
GLUCOSE: 203 mg/dL — AB (ref 70–99)
Potassium: 3.6 mmol/L (ref 3.5–5.1)
Sodium: 136 mmol/L (ref 135–145)
TOTAL PROTEIN: 6.5 g/dL (ref 6.5–8.1)

## 2017-10-29 LAB — CBC WITH DIFFERENTIAL (CANCER CENTER ONLY)
BASOS ABS: 0.1 10*3/uL (ref 0.0–0.1)
BASOS PCT: 1 %
EOS ABS: 0 10*3/uL (ref 0.0–0.5)
Eosinophils Relative: 0 %
HEMATOCRIT: 32.8 % — AB (ref 38.4–49.9)
Hemoglobin: 11.3 g/dL — ABNORMAL LOW (ref 13.0–17.1)
Lymphocytes Relative: 9 %
Lymphs Abs: 1 10*3/uL (ref 0.9–3.3)
MCH: 32 pg (ref 27.2–33.4)
MCHC: 34.4 g/dL (ref 32.0–36.0)
MCV: 93.1 fL (ref 79.3–98.0)
Monocytes Absolute: 1.3 10*3/uL — ABNORMAL HIGH (ref 0.1–0.9)
Monocytes Relative: 11 %
NEUTROS ABS: 9.1 10*3/uL — AB (ref 1.5–6.5)
Neutrophils Relative %: 79 %
PLATELETS: 209 10*3/uL (ref 140–400)
RBC: 3.53 MIL/uL — ABNORMAL LOW (ref 4.20–5.82)
RDW: 18.4 % — ABNORMAL HIGH (ref 11.0–14.6)
WBC Count: 11.5 10*3/uL — ABNORMAL HIGH (ref 4.0–10.3)

## 2017-10-29 MED ORDER — PALONOSETRON HCL INJECTION 0.25 MG/5ML
0.2500 mg | Freq: Once | INTRAVENOUS | Status: AC
  Administered 2017-10-29: 0.25 mg via INTRAVENOUS

## 2017-10-29 MED ORDER — PALONOSETRON HCL INJECTION 0.25 MG/5ML
INTRAVENOUS | Status: AC
  Filled 2017-10-29: qty 5

## 2017-10-29 MED ORDER — SODIUM CHLORIDE 0.9 % IV SOLN
402.0000 mg | Freq: Once | INTRAVENOUS | Status: AC
  Administered 2017-10-29: 400 mg via INTRAVENOUS
  Filled 2017-10-29: qty 40

## 2017-10-29 MED ORDER — SODIUM CHLORIDE 0.9 % IV SOLN
90.0000 mg/m2 | Freq: Once | INTRAVENOUS | Status: DC
  Filled 2017-10-29: qty 12.5

## 2017-10-29 MED ORDER — SODIUM CHLORIDE 0.9 % IV SOLN
Freq: Once | INTRAVENOUS | Status: AC
  Administered 2017-10-29: 13:00:00 via INTRAVENOUS

## 2017-10-29 MED ORDER — FOSAPREPITANT DIMEGLUMINE INJECTION 150 MG
Freq: Once | INTRAVENOUS | Status: AC
  Administered 2017-10-29: 14:00:00 via INTRAVENOUS
  Filled 2017-10-29: qty 5

## 2017-10-29 MED ORDER — SODIUM CHLORIDE 0.9 % IV SOLN
90.0000 mg/m2 | Freq: Once | INTRAVENOUS | Status: AC
  Administered 2017-10-29: 250 mg via INTRAVENOUS
  Filled 2017-10-29: qty 12.5

## 2017-10-29 NOTE — Progress Notes (Signed)
Per Cira Rue, NP ok to tx with bili 7.6 and ctn 1.81.

## 2017-10-29 NOTE — Progress Notes (Signed)
Received call from [facility] in the lab about Pt's bilirubin today it was 7.6 Lacie aware.

## 2017-10-29 NOTE — Patient Instructions (Addendum)
[facility] Discharge Instructions for Patients Receiving Chemotherapy  Today you received the following chemotherapy agents carboplatin (Paraplatin) and etoposide (Vepesid)  To help prevent nausea and vomiting after your treatment, we encourage you to take your nausea medication as directed by your doctor.   If you develop nausea and vomiting that is not controlled by your nausea medication, call the clinic.   BELOW ARE SYMPTOMS THAT SHOULD BE REPORTED IMMEDIATELY:  *FEVER GREATER THAN 100.5 F  *CHILLS WITH OR WITHOUT FEVER  NAUSEA AND VOMITING THAT IS NOT CONTROLLED WITH YOUR NAUSEA MEDICATION  *UNUSUAL SHORTNESS OF BREATH  *UNUSUAL BRUISING OR BLEEDING  TENDERNESS IN MOUTH AND THROAT WITH OR WITHOUT PRESENCE OF ULCERS  *URINARY PROBLEMS  *BOWEL PROBLEMS  UNUSUAL RASH Items with * indicate a potential emergency and should be followed up as soon as possible.  Feel free to call the clinic should you have any questions or concerns. The clinic phone number is [phone].  Please show the [facility] at check-in to the Emergency Department and triage nurse.  Carboplatin injection What is this medicine? CARBOPLATIN (KAR boe pla tin) is a chemotherapy drug. It targets fast dividing cells, like cancer cells, and causes these cells to die. This medicine is used to treat ovarian cancer and many other cancers. This medicine may be used for other purposes; ask your health care provider or pharmacist if you have questions. COMMON BRAND NAME(S): Paraplatin What should I tell my health care provider before I take this medicine? They need to know if you have any of these conditions: -blood disorders -hearing problems -kidney disease -recent or ongoing radiation therapy -an unusual or allergic reaction to carboplatin, cisplatin, other chemotherapy, other medicines, foods, dyes, or preservatives -pregnant or trying to get pregnant -breast-feeding How should I  use this medicine? This drug is usually given as an infusion into a vein. It is administered in a hospital or clinic by a specially trained health care professional. Talk to your pediatrician regarding the use of this medicine in children. Special care may be needed. Overdosage: If you think you have taken too much of this medicine contact a poison control center or emergency room at once. NOTE: This medicine is only for you. Do not share this medicine with others. What if I miss a dose? It is important not to miss a dose. Call your doctor or health care professional if you are unable to keep an appointment. What may interact with this medicine? -medicines for seizures -medicines to increase blood counts like filgrastim, pegfilgrastim, sargramostim -some antibiotics like amikacin, gentamicin, neomycin, streptomycin, tobramycin -vaccines Talk to your doctor or health care professional before taking any of these medicines: -acetaminophen -aspirin -ibuprofen -ketoprofen -naproxen This list may not describe all possible interactions. Give your health care provider a list of all the medicines, herbs, non-prescription drugs, or dietary supplements you use. Also tell them if you smoke, drink alcohol, or use illegal drugs. Some items may interact with your medicine. What should I watch for while using this medicine? Your condition will be monitored carefully while you are receiving this medicine. You will need important blood work done while you are taking this medicine. This drug may make you feel generally unwell. This is not uncommon, as chemotherapy can affect healthy cells as well as cancer cells. Report any side effects. Continue your course of treatment even though you feel ill unless your doctor tells you to stop. In some cases, you may be given additional medicines  to help with side effects. Follow all directions for their use. Call your doctor or health care professional for advice if you  get a fever, chills or sore throat, or other symptoms of a cold or flu. Do not treat yourself. This drug decreases your body's ability to fight infections. Try to avoid being around people who are sick. This medicine may increase your risk to bruise or bleed. Call your doctor or health care professional if you notice any unusual bleeding. Be careful brushing and flossing your teeth or using a toothpick because you may get an infection or bleed more easily. If you have any dental work done, tell your dentist you are receiving this medicine. Avoid taking products that contain aspirin, acetaminophen, ibuprofen, naproxen, or ketoprofen unless instructed by your doctor. These medicines may hide a fever. Do not become pregnant while taking this medicine. Women should inform their doctor if they wish to become pregnant or think they might be pregnant. There is a potential for serious side effects to an unborn child. Talk to your health care professional or pharmacist for more information. Do not breast-feed an infant while taking this medicine. What side effects may I notice from receiving this medicine? Side effects that you should report to your doctor or health care professional as soon as possible: -allergic reactions like skin rash, itching or hives, swelling of the face, lips, or tongue -signs of infection - fever or chills, cough, sore throat, pain or difficulty passing urine -signs of decreased platelets or bleeding - bruising, pinpoint red spots on the skin, black, tarry stools, nosebleeds -signs of decreased red blood cells - unusually weak or tired, fainting spells, lightheadedness -breathing problems -changes in hearing -changes in vision -chest pain -high blood pressure -low blood counts - This drug may decrease the number of white blood cells, red blood cells and platelets. You may be at increased risk for infections and bleeding. -nausea and vomiting -pain, swelling, redness or irritation  at the injection site -pain, tingling, numbness in the hands or feet -problems with balance, talking, walking -trouble passing urine or change in the amount of urine Side effects that usually do not require medical attention (report to your doctor or health care professional if they continue or are bothersome): -hair loss -loss of appetite -metallic taste in the mouth or changes in taste This list may not describe all possible side effects. Call your doctor for medical advice about side effects. You may report side effects to FDA at 1-800-FDA-1088. Where should I keep my medicine? This drug is given in a hospital or clinic and will not be stored at home. NOTE: This sheet is a summary. It may not cover all possible information. If you have questions about this medicine, talk to your doctor, pharmacist, or health care provider.  2018 [employer] Standard (2007-07-08 14:38:05)  Etoposide, VP-16 injection What is this medicine? ETOPOSIDE, VP-16 (e toe POE side) is a chemotherapy drug. It is used to treat testicular cancer, lung cancer, and other cancers. This medicine may be used for other purposes; ask your health care provider or pharmacist if you have questions. COMMON BRAND NAME(S): Etopophos, Toposar, VePesid What should I tell my health care provider before I take this medicine? They need to know if you have any of these conditions: -infection -kidney disease -liver disease -low blood counts, like low white cell, platelet, or red cell counts -an unusual or allergic reaction to etoposide, other medicines, foods, dyes, or preservatives -pregnant or trying to get  pregnant -breast-feeding How should I use this medicine? This medicine is for infusion into a vein. It is administered in a hospital or clinic by a specially trained health care professional. Talk to your pediatrician regarding the use of this medicine in children. Special care may be needed. Overdosage: If you think you  have taken too much of this medicine contact a poison control center or emergency room at once. NOTE: This medicine is only for you. Do not share this medicine with others. What if I miss a dose? It is important not to miss your dose. Call your doctor or health care professional if you are unable to keep an appointment. What may interact with this medicine? -aspirin -certain medications for seizures like carbamazepine, phenobarbital, phenytoin, valproic acid -cyclosporine -levamisole -warfarin This list may not describe all possible interactions. Give your health care provider a list of all the medicines, herbs, non-prescription drugs, or dietary supplements you use. Also tell them if you smoke, drink alcohol, or use illegal drugs. Some items may interact with your medicine. What should I watch for while using this medicine? Visit your doctor for checks on your progress. This drug may make you feel generally unwell. This is not uncommon, as chemotherapy can affect healthy cells as well as cancer cells. Report any side effects. Continue your course of treatment even though you feel ill unless your doctor tells you to stop. In some cases, you may be given additional medicines to help with side effects. Follow all directions for their use. Call your doctor or health care professional for advice if you get a fever, chills or sore throat, or other symptoms of a cold or flu. Do not treat yourself. This drug decreases your body's ability to fight infections. Try to avoid being around people who are sick. This medicine may increase your risk to bruise or bleed. Call your doctor or health care professional if you notice any unusual bleeding. Talk to your doctor about your risk of cancer. You may be more at risk for certain types of cancers if you take this medicine. Do not become pregnant while taking this medicine or for at least 6 months after stopping it. Women should inform their doctor if they wish to  become pregnant or think they might be pregnant. Women of child-bearing potential will need to have a negative pregnancy test before starting this medicine. There is a potential for serious side effects to an unborn child. Talk to your health care professional or pharmacist for more information. Do not breast-feed an infant while taking this medicine. Men must use a latex condom during sexual contact with a woman while taking this medicine and for at least 4 months after stopping it. A latex condom is needed even if you have had a vasectomy. Contact your doctor right away if your partner becomes pregnant. Do not donate sperm while taking this medicine and for at least 4 months after you stop taking this medicine. Men should inform their doctors if they wish to father a child. This medicine may lower sperm counts. What side effects may I notice from receiving this medicine? Side effects that you should report to your doctor or health care professional as soon as possible: -allergic reactions like skin rash, itching or hives, swelling of the face, lips, or tongue -low blood counts - this medicine may decrease the number of white blood cells, red blood cells and platelets. You may be at increased risk for infections and bleeding. -signs of infection -  fever or chills, cough, sore throat, pain or difficulty passing urine -signs of decreased platelets or bleeding - bruising, pinpoint red spots on the skin, black, tarry stools, blood in the urine -signs of decreased red blood cells - unusually weak or tired, fainting spells, lightheadedness -breathing problems -changes in vision -mouth or throat sores or ulcers -pain, redness, swelling or irritation at the injection site -pain, tingling, numbness in the hands or feet -redness, blistering, peeling or loosening of the skin, including inside the mouth -seizures -vomiting Side effects that usually do not require medical attention (report to your doctor or  health care professional if they continue or are bothersome): -diarrhea -hair loss -loss of appetite -nausea -stomach pain This list may not describe all possible side effects. Call your doctor for medical advice about side effects. You may report side effects to FDA at 1-800-FDA-1088. Where should I keep my medicine? This drug is given in a hospital or clinic and will not be stored at home. NOTE: This sheet is a summary. It may not cover all possible information. If you have questions about this medicine, talk to your doctor, pharmacist, or health care provider.  2018 [employer]vier/Gold Standard (2015-03-25 11:53:23)

## 2017-10-30 ENCOUNTER — Inpatient Hospital Stay: Payer: BLUE CROSS/BLUE SHIELD

## 2017-10-30 ENCOUNTER — Ambulatory Visit (HOSPITAL_BASED_OUTPATIENT_CLINIC_OR_DEPARTMENT_OTHER): Payer: BLUE CROSS/BLUE SHIELD | Admitting: Medical

## 2017-10-30 VITALS — BP 95/50 | HR 94 | Temp 98.1°F | Resp 18

## 2017-10-30 DIAGNOSIS — C9002 Multiple myeloma in relapse: Secondary | ICD-10-CM

## 2017-10-30 DIAGNOSIS — I951 Orthostatic hypotension: Secondary | ICD-10-CM | POA: Diagnosis not present

## 2017-10-30 DIAGNOSIS — C787 Secondary malignant neoplasm of liver and intrahepatic bile duct: Secondary | ICD-10-CM | POA: Diagnosis not present

## 2017-10-30 DIAGNOSIS — C3490 Malignant neoplasm of unspecified part of unspecified bronchus or lung: Secondary | ICD-10-CM

## 2017-10-30 DIAGNOSIS — C3491 Malignant neoplasm of unspecified part of right bronchus or lung: Secondary | ICD-10-CM | POA: Diagnosis not present

## 2017-10-30 MED ORDER — ETOPOSIDE CHEMO INJECTION 1 GM/50ML
90.0000 mg/m2 | Freq: Once | INTRAVENOUS | Status: AC
  Administered 2017-10-30: 250 mg via INTRAVENOUS
  Filled 2017-10-30: qty 12.5

## 2017-10-30 MED ORDER — DEXAMETHASONE SODIUM PHOSPHATE 10 MG/ML IJ SOLN
INTRAMUSCULAR | Status: AC
  Filled 2017-10-30: qty 1

## 2017-10-30 MED ORDER — SODIUM CHLORIDE 0.9 % IV SOLN
Freq: Once | INTRAVENOUS | Status: AC
  Administered 2017-10-30: 16:00:00 via INTRAVENOUS

## 2017-10-30 MED ORDER — SODIUM CHLORIDE 0.9 % IV SOLN
Freq: Once | INTRAVENOUS | Status: AC
  Administered 2017-10-30: 15:00:00 via INTRAVENOUS

## 2017-10-30 MED ORDER — DEXAMETHASONE SODIUM PHOSPHATE 10 MG/ML IJ SOLN
10.0000 mg | Freq: Once | INTRAMUSCULAR | Status: AC
  Administered 2017-10-30: 10 mg via INTRAVENOUS

## 2017-10-30 NOTE — Patient Instructions (Signed)
[facility] Discharge Instructions for Patients Receiving Chemotherapy  Today you received the following chemotherapy agents Etoposide  To help prevent nausea and vomiting after your treatment, we encourage you to take your nausea medication as directed   If you develop nausea and vomiting that is not controlled by your nausea medication, call the clinic.   BELOW ARE SYMPTOMS THAT SHOULD BE REPORTED IMMEDIATELY:  *FEVER GREATER THAN 100.5 F  *CHILLS WITH OR WITHOUT FEVER  NAUSEA AND VOMITING THAT IS NOT CONTROLLED WITH YOUR NAUSEA MEDICATION  *UNUSUAL SHORTNESS OF BREATH  *UNUSUAL BRUISING OR BLEEDING  TENDERNESS IN MOUTH AND THROAT WITH OR WITHOUT PRESENCE OF ULCERS  *URINARY PROBLEMS  *BOWEL PROBLEMS  UNUSUAL RASH Items with * indicate a potential emergency and should be followed up as soon as possible.  Feel free to call the clinic should you have any questions or concerns. The clinic phone number is [phone].  Please show the [facility] at check-in to the Emergency Department and triage nurse.

## 2017-10-30 NOTE — Progress Notes (Signed)
Pt states he has been dizzy all day with blurry vision.  Upon assessment BP sitting 84/44 and standing 50/35.  Sandi Mealy, Pa notified and orders received to administer 1L of NS over 1 hour.   Per [name], okay to treat pt with BP of 84/44

## 2017-10-30 NOTE — Progress Notes (Signed)
10/30/17 @ 1530  Confirmed with Dr Julien Nordmann dose of Etoposide is 90 mg/m2 on days 1,2 and 3.  Treatment plan modified as per telephone order.  T.O. Dr Mohamed/Antonia Jicha Ronnald Ramp, PharmD

## 2017-10-31 ENCOUNTER — Inpatient Hospital Stay: Payer: BLUE CROSS/BLUE SHIELD

## 2017-10-31 ENCOUNTER — Non-Acute Institutional Stay: Payer: Self-pay | Admitting: Hospice and Palliative Medicine

## 2017-10-31 VITALS — BP 101/54 | HR 90 | Temp 97.8°F | Resp 20

## 2017-10-31 DIAGNOSIS — Z515 Encounter for palliative care: Secondary | ICD-10-CM

## 2017-10-31 DIAGNOSIS — C3491 Malignant neoplasm of unspecified part of right bronchus or lung: Secondary | ICD-10-CM | POA: Diagnosis not present

## 2017-10-31 DIAGNOSIS — C3490 Malignant neoplasm of unspecified part of unspecified bronchus or lung: Secondary | ICD-10-CM

## 2017-10-31 MED ORDER — SODIUM CHLORIDE 0.9 % IV SOLN
90.0000 mg/m2 | Freq: Once | INTRAVENOUS | Status: AC
  Administered 2017-10-31: 250 mg via INTRAVENOUS
  Filled 2017-10-31: qty 12.5

## 2017-10-31 MED ORDER — SODIUM CHLORIDE 0.9 % IV SOLN: 90.0000 mg/m2 | Freq: Once | INTRAVENOUS | Status: DC

## 2017-10-31 MED ORDER — SODIUM CHLORIDE 0.9 % IV SOLN
Freq: Once | INTRAVENOUS | Status: AC
  Administered 2017-10-31: 15:00:00 via INTRAVENOUS

## 2017-10-31 MED ORDER — DEXAMETHASONE SODIUM PHOSPHATE 10 MG/ML IJ SOLN
10.0000 mg | Freq: Once | INTRAMUSCULAR | Status: AC
Start: 2017-10-31 — End: 2017-10-31
  Administered 2017-10-31: 10 mg via INTRAVENOUS

## 2017-10-31 MED ORDER — DEXAMETHASONE SODIUM PHOSPHATE 10 MG/ML IJ SOLN
INTRAMUSCULAR | Status: AC
  Filled 2017-10-31: qty 1

## 2017-10-31 NOTE — Patient Instructions (Signed)
[facility] Discharge Instructions for Patients Receiving Chemotherapy  Today you received the following chemotherapy agents Etoposide  To help prevent nausea and vomiting after your treatment, we encourage you to take your nausea medication as directed   If you develop nausea and vomiting that is not controlled by your nausea medication, call the clinic.   BELOW ARE SYMPTOMS THAT SHOULD BE REPORTED IMMEDIATELY:  *FEVER GREATER THAN 100.5 F  *CHILLS WITH OR WITHOUT FEVER  NAUSEA AND VOMITING THAT IS NOT CONTROLLED WITH YOUR NAUSEA MEDICATION  *UNUSUAL SHORTNESS OF BREATH  *UNUSUAL BRUISING OR BLEEDING  TENDERNESS IN MOUTH AND THROAT WITH OR WITHOUT PRESENCE OF ULCERS  *URINARY PROBLEMS  *BOWEL PROBLEMS  UNUSUAL RASH Items with * indicate a potential emergency and should be followed up as soon as possible.  Feel free to call the clinic should you have any questions or concerns. The clinic phone number is [phone].  Please show the [facility] at check-in to the Emergency Department and triage nurse.

## 2017-10-31 NOTE — Progress Notes (Signed)
Follow up visit attempted. Patient has discharged from facility.

## 2017-11-01 NOTE — Progress Notes (Signed)
[facility] Progress Note   Osborne Serio [id number] 05/20/1945 72 y.o.  Keevon Henney is managed by Dr. Fanny Bien. 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  Orthostasis: The patient was given 1 L of normal saline IV today.  Patient has a history of hyponatremia and SIADH and is on fluid restrictions.  Please see After Visit Summary for patient specific instructions.  Future Appointments  Date Time Provider Daniel  11/02/2017 11:45 AM [facility] FLUSH [facility] None  11/04/2017  1:30 PM [facility] LAB 1 [facility] None  11/11/2017  1:45 PM [facility] LAB 2 [facility] None  11/18/2017 10:30 AM [facility] LAB 2 [facility] None  11/18/2017 11:00 AM Curt Bears, MD [facility] None  11/18/2017 12:00 PM [facility] INFUSION [facility] None  11/19/2017  8:00 AM [facility] INFUSION [facility] None  11/20/2017  8:00 AM [facility] INFUSION [facility] None  11/22/2017  2:00 PM [facility] FLUSH [facility] None  11/25/2017  1:45 PM [facility] LAB 2 [facility] None  12/02/2017  1:45 PM [facility] LAB 2 [facility] None  12/09/2017  9:45 AM [facility] LAB 1 [facility] None  12/09/2017 10:15 AM Curt Bears, MD [facility] None  12/09/2017 11:00 AM [facility] INFUSION [facility] None  12/10/2017  8:00 AM [facility] INFUSION [facility] None  12/11/2017  8:00 AM [facility] INFUSION [facility] None  12/13/2017  2:00 PM [facility] FLUSH [facility] None    No orders of the defined types were placed in this encounter.      Subjective:   Patient ID:  Kaevon Cotta is a 72 y.o. (DOB 26-Aug-1945) male.  Chief Complaint: No  chief complaint on file.   HPI Tramon Crescenzo is a 72 year old male with a history of an extensive stage small cell lung cancer who is managed by Dr. Fanny Bien. Mohamed and is to receive cycle 2, day 2 of carboplatin and etoposide today.  I was asked to see this patient in the infusion room after it was noted that he had orthostasis.  The patient's blood pressure while sitting was 84/44 and was noted to drop to 50/35 when standing.  The patient was asymptomatic.  Patient has a history of hyponatremia and SIADH.  He is on fluid restrictions.  He denies any issues of concern today.  He denies chest pain, shortness of breath, dizziness, weakness, or fatigue.  Medications: I have reviewed the patient's current medications.  Allergies:  Allergies  Allergen Reactions  . Amoxicillin-Pot Clavulanate Anaphylaxis  . Nitrofurantoin Anaphylaxis    Past Medical History:  Diagnosis Date  . Arthritis   . Cancer ([place])   . Diabetes mellitus without complication ([place])   . Hypertension     Past Surgical History:  Procedure Laterality Date  . JOINT REPLACEMENT Right    knee    Family History  Problem Relation Age of Onset  . Diabetes Mother   . Colon cancer Father   . CAD Neg Hx     Social History   Socioeconomic History  . Marital status: Single    Spouse name: Not on file  . Number of children: Not on file  . Years of education: Not on file  . Highest education level: Not on file  Occupational History  .  Occupation: [employer] operational officer  . Financial resource strain: Not on file  . Food insecurity:    Worry: Not on file    Inability: Not on file  . Transportation needs:    Medical: Not on file    Non-medical: Not on file  Tobacco Use  . Smoking status: Current Every Day Smoker    Packs/day: 1.50    Years: 8.00    Pack years: 12.00    Types: Cigarettes  . Smokeless tobacco: Never Used  Substance and Sexual Activity  . Alcohol use: No  . Drug use: No  . Sexual  activity: Not on file  Lifestyle  . Physical activity:    Days per week: Not on file    Minutes per session: Not on file  . Stress: Not on file  Relationships  . Social connections:    Talks on phone: Not on file    Gets together: Not on file    Attends religious service: Not on file    Active member of club or organization: Not on file    Attends meetings of clubs or organizations: Not on file    Relationship status: Not on file  . Intimate partner violence:    Fear of current or ex partner: Not on file    Emotionally abused: Not on file    Physically abused: Not on file    Forced sexual activity: Not on file  Other Topics Concern  . Not on file  Social History Narrative  . Not on file    Past Medical History, Surgical history, Social history, and Family history were reviewed and updated as appropriate.   Please see review of systems for further details on the patient's review from today.   Review of Systems:  Review of Systems  Constitutional: Negative for chills, diaphoresis and fever.  HENT: Negative for trouble swallowing and voice change.   Respiratory: Negative for cough, chest tightness, shortness of breath and wheezing.   Cardiovascular: Negative for chest pain and palpitations.  Gastrointestinal: Negative for abdominal pain, constipation, diarrhea, nausea and vomiting.  Musculoskeletal: Negative for back pain and myalgias.  Neurological: Negative for dizziness, light-headedness and headaches.    Objective:   Physical Exam:  There were no vitals taken for this visit. ECOG: 1  Orthostatic Blood Pressure: Blood pressure:   sitting 84/44, standing 50/35 Pulse:   sitting 81, standing 94   Physical Exam  Constitutional: No distress.  HENT:  Head: Normocephalic and atraumatic.  Cardiovascular: Normal rate, regular rhythm and normal heart sounds. Exam reveals no gallop and no friction rub.  No murmur heard. Pulmonary/Chest: Effort normal and breath sounds  normal. No respiratory distress. He has no wheezes. He has no rales.  Neurological: He is alert.  Skin: Skin is warm and dry. No rash noted. He is not diaphoretic. No erythema.    Lab Review:     Component Value Date/Time   NA 136 10/29/2017 1051   K 3.6 10/29/2017 1051   CL 95 (L) 10/29/2017 1051   CO2 28 10/29/2017 1051   GLUCOSE 203 (H) 10/29/2017 1051   BUN 20 10/29/2017 1051   CREATININE 1.81 (H) 10/29/2017 1051   CALCIUM 8.9 10/29/2017 1051   PROT 6.5 10/29/2017 1051   ALBUMIN 2.4 (L) 10/29/2017 1051   AST 56 (H) 10/29/2017 1051   ALT 49 (H) 10/29/2017 1051   ALKPHOS 208 (H) 10/29/2017 1051   BILITOT 7.6 (HH) 10/29/2017 1051   GFRNONAA 36 (L) 10/29/2017  1051   GFRAA 42 (L) 10/29/2017 1051       Component Value Date/Time   WBC 11.5 (H) 10/29/2017 1051   WBC 6.4 10/08/2017 0407   RBC 3.53 (L) 10/29/2017 1051   HGB 11.3 (L) 10/29/2017 1051   HCT 32.8 (L) 10/29/2017 1051   PLT 209 10/29/2017 1051   MCV 93.1 10/29/2017 1051   MCV 94.3 06/07/2012 1818   MCH 32.0 10/29/2017 1051   MCHC 34.4 10/29/2017 1051   RDW 18.4 (H) 10/29/2017 1051   LYMPHSABS 1.0 10/29/2017 1051   MONOABS 1.3 (H) 10/29/2017 1051   EOSABS 0.0 10/29/2017 1051   BASOSABS 0.1 10/29/2017 1051   -------------------------------  Imaging from last 24 hours (if applicable):  Radiology interpretation: Dg Eye Foreign Body  Result Date: 10/23/2017 CLINICAL DATA:  Metal working/exposure; clearance prior to MRI EXAM: ORBITS FOR FOREIGN BODY - 2 VIEW COMPARISON:  None. FINDINGS: Water's views thighs deviated to the left and to the right were obtained. No intraorbital radiopaque foreign body. There are small frontal osteomas in the left frontal sinus. Paranasal sinuses elsewhere clear. There is leftward deviation of the nasal septum. No fracture or dislocation. IMPRESSION: No evidence of metallic foreign body within the orbits. Electronically Signed   By: Lowella Grip III M.D.   On: 10/23/2017 16:03    Dg Lumbar Spine 2-3 Views  Result Date: 10/08/2017 CLINICAL DATA:  Acute back pain for 2 days, no known injury, initial encounter EXAM: LUMBAR SPINE - 2-3 VIEW COMPARISON:  09/23/2017 FINDINGS: Five lumbar type vertebral bodies are well visualized. Vertebral body height is well maintained. Mild osteophytic changes are noted. No soft tissue abnormality is seen. No anterolisthesis is noted. IMPRESSION: Mild degenerative change without acute abnormality. Electronically Signed   By: Inez Catalina M.D.   On: 10/08/2017 09:53   Mr Jeri Cos ZJ Contrast  Result Date: 10/23/2017 CLINICAL DATA:  72 y/o M; lung cancer, evaluation for metastatic disease, SRS protocol. EXAM: MRI HEAD WITHOUT AND WITH CONTRAST TECHNIQUE: Multiplanar, multiecho pulse sequences of the brain and surrounding structures were obtained without and with intravenous contrast. CONTRAST:  9mL MULTIHANCE GADOBENATE DIMEGLUMINE 529 MG/ML IV SOLN COMPARISON:  10/21/2017 PET-CT. FINDINGS: Brain: Several nonspecific foci of T2 FLAIR hyperintense signal abnormality in subcortical and periventricular white matter are compatible with moderate chronic microvascular ischemic changes for age. Moderate brain parenchymal volume loss. No evidence for stroke, hemorrhage, mass effect, hydrocephalus, extra-axial collection, or herniation. After administration of intravenous contrast there is no abnormal enhancement of the brain. Vascular: Normal flow voids. Skull and upper cervical spine: Numerous enhancing lesions throughout the visible cervical spine and the calvarium predominantly posteriorly compatible with metastatic disease. Sinuses/Orbits: Negative. Other: None. IMPRESSION: 1. No intracranial metastatic disease identified. 2. Multiple enhancing lesions of the upper cervical spine and calvarium compatible with bony metastasis. No extra osseous extension of neoplasm. 3. Moderate chronic microvascular ischemic changes and parenchymal volume loss of the brain.  Electronically Signed   By: Kristine Garbe M.D.   On: 10/23/2017 22:35   Nm Pet Image Initial (pi) Skull Base To Thigh  Result Date: 10/21/2017 CLINICAL DATA:  Initial treatment strategy for lung cancer. EXAM: NUCLEAR MEDICINE PET SKULL BASE TO THIGH TECHNIQUE: 15.03 mCi F-18 FDG was injected intravenously. Full-ring PET imaging was performed from the skull base to thigh after the radiotracer. CT data was obtained and used for attenuation correction and anatomic localization. Fasting blood glucose: 113 mg/dl COMPARISON:  CT chest 09/23/2017 FINDINGS: Mediastinal blood pool activity: SUV  max 3.42 NECK: No hypermetabolic lymph nodes in the neck. Incidental CT findings: none CHEST: Enlarged and hypermetabolic right paratracheal lymph nodes. Index right paratracheal lymph node measures 2.3 cm and has an SUV max of 7.46. Previously this measured 3.4 cm. Azygoesophageal lymph node measures 1.2 cm and has an SUV max of 4.73. Previously this lymph node measured 1.7 cm. No hypermetabolic supraclavicular or axillary lymph nodes. Pulmonary nodule in the right apex measures 1.1 cm and has an SUV max of 5.12. Previously this measured 1.4 cm. Right upper lobe perihilar nodule measures 1.5 cm and has an SUV max of 16.01. Previously this measured 1.6 cm. Perifissural nodule within the right middle lobe measures 7 mm and has an SUV max of 0.94. Incidental CT findings: Aortic atherosclerosis. Calcifications in the LAD, RCA and left circumflex coronary arteries noted. ABDOMEN/PELVIS: No abnormal radiotracer uptake identified within the liver. Specifically there are no areas of increased uptake to correspond with the biopsy-proven liver metastasis. No abnormal tracer uptake identified within the pancreas or spleen. The adrenal glands are unremarkable. No hypermetabolic lymph nodes within the abdomen or pelvis. Focal area of increased activity is identified within the anus within SUV max of 11.9. This is nonspecific and may  be physiologic. Incidental CT findings: none SKELETON: There is diffuse radiotracer uptake identified throughout the axial and appendicular skeleton. No dominant foci of increased uptake identified. SUV max within the right iliac bone is equal to 4.63. Incidental CT findings: none IMPRESSION: 1. Interval response to therapy compared with CT CAP from 09/23/2017. There is been decrease in size of right upper lobe pulmonary nodules and right paratracheal and posterior mediastinal lymph nodes. 2. Liver metastasis are again noted. No significant FDG uptake identified within these lesions above background liver activity. 3. There is diffuse radiotracer uptake throughout the axial and appendicular skeleton. Although nonspecific this may reflect treatment related changes. 4. Aortic atherosclerosis and multi vessel coronary artery atherosclerotic calcifications. Electronically Signed   By: Kerby Moors M.D.   On: 10/21/2017 16:12   Dg Chest Port 1 View  Result Date: 10/04/2017 CLINICAL DATA:  Shortness of breath EXAM: PORTABLE CHEST 1 VIEW COMPARISON:  Portable exam 0936 hours compared to 09/27/2017 FINDINGS: Upper normal heart size. Atherosclerotic calcification aorta. Mediastinal contours and pulmonary vascularity normal. Lungs clear. No pleural effusion or pneumothorax. No acute osseous findings. IMPRESSION: No acute abnormalities. Electronically Signed   By: Lavonia Dana M.D.   On: 10/04/2017 11:38        This case was discussed with [name]. He expressed agreement with my management of this patient.

## 2017-11-02 ENCOUNTER — Inpatient Hospital Stay: Payer: BLUE CROSS/BLUE SHIELD

## 2017-11-02 VITALS — BP 125/61 | HR 91 | Temp 98.3°F | Resp 16

## 2017-11-02 DIAGNOSIS — C3491 Malignant neoplasm of unspecified part of right bronchus or lung: Secondary | ICD-10-CM | POA: Diagnosis not present

## 2017-11-02 DIAGNOSIS — C3490 Malignant neoplasm of unspecified part of unspecified bronchus or lung: Secondary | ICD-10-CM

## 2017-11-02 MED ORDER — PEGFILGRASTIM-CBQV 6 MG/0.6ML ~~LOC~~ SOSY
PREFILLED_SYRINGE | SUBCUTANEOUS | Status: AC
  Filled 2017-11-02: qty 0.6

## 2017-11-02 MED ORDER — PEGFILGRASTIM-CBQV 6 MG/0.6ML ~~LOC~~ SOSY
6.0000 mg | PREFILLED_SYRINGE | Freq: Once | SUBCUTANEOUS | Status: AC
  Administered 2017-11-02: 6 mg via SUBCUTANEOUS

## 2017-11-02 NOTE — Patient Instructions (Signed)
Pegfilgrastim injection What is this medicine? PEGFILGRASTIM (PEG fil gra stim) is a long-acting granulocyte colony-stimulating factor that stimulates the growth of neutrophils, a type of white blood cell important in the body's fight against infection. It is used to reduce the incidence of fever and infection in patients with certain types of cancer who are receiving chemotherapy that affects the bone marrow, and to increase survival after being exposed to high doses of radiation. This medicine may be used for other purposes; ask your health care provider or pharmacist if you have questions. COMMON BRAND NAME(S): Neulasta What should I tell my health care provider before I take this medicine? They need to know if you have any of these conditions: -kidney disease -latex allergy -ongoing radiation therapy -sickle cell disease -skin reactions to acrylic adhesives (On-Body Injector only) -an unusual or allergic reaction to pegfilgrastim, filgrastim, other medicines, foods, dyes, or preservatives -pregnant or trying to get pregnant -breast-feeding How should I use this medicine? This medicine is for injection under the skin. If you get this medicine at home, you will be taught how to prepare and give the pre-filled syringe or how to use the On-body Injector. Refer to the patient Instructions for Use for detailed instructions. Use exactly as directed. Tell your healthcare provider immediately if you suspect that the On-body Injector may not have performed as intended or if you suspect the use of the On-body Injector resulted in a missed or partial dose. It is important that you put your used needles and syringes in a special sharps container. Do not put them in a trash can. If you do not have a sharps container, call your pharmacist or healthcare provider to get one. Talk to your pediatrician regarding the use of this medicine in children. While this drug may be prescribed for selected conditions,  precautions do apply. Overdosage: If you think you have taken too much of this medicine contact a poison control center or emergency room at once. NOTE: This medicine is only for you. Do not share this medicine with others. What if I miss a dose? It is important not to miss your dose. Call your doctor or health care professional if you miss your dose. If you miss a dose due to an On-body Injector failure or leakage, a new dose should be administered as soon as possible using a single prefilled syringe for manual use. What may interact with this medicine? Interactions have not been studied. Give your health care provider a list of all the medicines, herbs, non-prescription drugs, or dietary supplements you use. Also tell them if you smoke, drink alcohol, or use illegal drugs. Some items may interact with your medicine. This list may not describe all possible interactions. Give your health care provider a list of all the medicines, herbs, non-prescription drugs, or dietary supplements you use. Also tell them if you smoke, drink alcohol, or use illegal drugs. Some items may interact with your medicine. What should I watch for while using this medicine? You may need blood work done while you are taking this medicine. If you are going to need a MRI, CT scan, or other procedure, tell your doctor that you are using this medicine (On-Body Injector only). What side effects may I notice from receiving this medicine? Side effects that you should report to your doctor or health care professional as soon as possible: -allergic reactions like skin rash, itching or hives, swelling of the face, lips, or tongue -dizziness -fever -pain, redness, or irritation at site   where injected -pinpoint red spots on the skin -red or dark-brown urine -shortness of breath or breathing problems -stomach or side pain, or pain at the shoulder -swelling -tiredness -trouble passing urine or change in the amount of urine Side  effects that usually do not require medical attention (report to your doctor or health care professional if they continue or are bothersome): -bone pain -muscle pain This list may not describe all possible side effects. Call your doctor for medical advice about side effects. You may report side effects to FDA at 1-800-FDA-1088. Where should I keep my medicine? Keep out of the reach of children. Store pre-filled syringes in a refrigerator between 2 and 8 degrees C (36 and 46 degrees F). Do not freeze. Keep in carton to protect from light. Throw away this medicine if it is left out of the refrigerator for more than 48 hours. Throw away any unused medicine after the expiration date. NOTE: This sheet is a summary. It may not cover all possible information. If you have questions about this medicine, talk to your doctor, pharmacist, or health care provider.  2018 Elsevier/Gold Standard (2016-03-29 12:58:03)  

## 2017-11-04 ENCOUNTER — Inpatient Hospital Stay: Payer: BLUE CROSS/BLUE SHIELD

## 2017-11-04 DIAGNOSIS — C3491 Malignant neoplasm of unspecified part of right bronchus or lung: Secondary | ICD-10-CM | POA: Diagnosis not present

## 2017-11-04 DIAGNOSIS — C3411 Malignant neoplasm of upper lobe, right bronchus or lung: Secondary | ICD-10-CM

## 2017-11-04 LAB — CBC WITH DIFFERENTIAL (CANCER CENTER ONLY)
Basophils Absolute: 0.1 10*3/uL (ref 0.0–0.1)
Basophils Relative: 1 %
Eosinophils Absolute: 0 10*3/uL (ref 0.0–0.5)
Eosinophils Relative: 0 %
HEMATOCRIT: 28.3 % — AB (ref 38.4–49.9)
Hemoglobin: 9.6 g/dL — ABNORMAL LOW (ref 13.0–17.1)
LYMPHS ABS: 0.7 10*3/uL — AB (ref 0.9–3.3)
LYMPHS PCT: 14 %
MCH: 32.4 pg (ref 27.2–33.4)
MCHC: 33.9 g/dL (ref 32.0–36.0)
MCV: 95.6 fL (ref 79.3–98.0)
MONOS PCT: 1 %
Monocytes Absolute: 0 10*3/uL — ABNORMAL LOW (ref 0.1–0.9)
NEUTROS ABS: 4.1 10*3/uL (ref 1.5–6.5)
Neutrophils Relative %: 84 %
Platelet Count: 63 10*3/uL — ABNORMAL LOW (ref 140–400)
RBC: 2.96 MIL/uL — ABNORMAL LOW (ref 4.20–5.82)
RDW: 18.5 % — ABNORMAL HIGH (ref 11.0–14.6)
WBC Count: 4.9 10*3/uL (ref 4.0–10.3)

## 2017-11-04 LAB — CMP (CANCER CENTER ONLY)
ALT: 48 U/L — ABNORMAL HIGH (ref 0–44)
ANION GAP: 9 (ref 5–15)
AST: 53 U/L — ABNORMAL HIGH (ref 15–41)
Albumin: 2.3 g/dL — ABNORMAL LOW (ref 3.5–5.0)
Alkaline Phosphatase: 201 U/L — ABNORMAL HIGH (ref 38–126)
BUN: 19 mg/dL (ref 8–23)
CHLORIDE: 103 mmol/L (ref 98–111)
CO2: 24 mmol/L (ref 22–32)
Calcium: 8.5 mg/dL — ABNORMAL LOW (ref 8.9–10.3)
Creatinine: 0.88 mg/dL (ref 0.61–1.24)
GFR, Est AFR Am: >60 mL/min (ref >60)
GFR, Estimated: >60 mL/min (ref >60)
Glucose, Bld: 216 mg/dL — ABNORMAL HIGH (ref 70–99)
POTASSIUM: 4.9 mmol/L (ref 3.5–5.1)
SODIUM: 136 mmol/L (ref 135–145)
Total Bilirubin: 9.2 mg/dL (ref 0.3–1.2)
Total Protein: 6.1 g/dL — ABNORMAL LOW (ref 6.5–8.1)

## 2017-11-08 ENCOUNTER — Emergency Department (HOSPITAL_COMMUNITY): Payer: BLUE CROSS/BLUE SHIELD

## 2017-11-08 ENCOUNTER — Inpatient Hospital Stay (HOSPITAL_COMMUNITY)
Admission: EM | Admit: 2017-11-08 | Discharge: 2017-11-12 | DRG: 871 | Disposition: A | Discharge status: Home Health | Payer: BLUE CROSS/BLUE SHIELD | Attending: Internal Medicine | Admitting: Internal Medicine

## 2017-11-08 ENCOUNTER — Other Ambulatory Visit: Payer: Self-pay

## 2017-11-08 ENCOUNTER — Inpatient Hospital Stay (HOSPITAL_BASED_OUTPATIENT_CLINIC_OR_DEPARTMENT_OTHER): Payer: BLUE CROSS/BLUE SHIELD | Admitting: Medical

## 2017-11-08 ENCOUNTER — Encounter (HOSPITAL_COMMUNITY): Payer: Self-pay

## 2017-11-08 ENCOUNTER — Inpatient Hospital Stay: Payer: BLUE CROSS/BLUE SHIELD | Admitting: Medical

## 2017-11-08 ENCOUNTER — Telehealth: Payer: Self-pay

## 2017-11-08 VITALS — BP 104/51 | HR 116 | Temp 100.0°F | Resp 22

## 2017-11-08 DIAGNOSIS — T451X5A Adverse effect of antineoplastic and immunosuppressive drugs, initial encounter: Secondary | ICD-10-CM | POA: Diagnosis present

## 2017-11-08 DIAGNOSIS — D709 Neutropenia, unspecified: Secondary | ICD-10-CM

## 2017-11-08 DIAGNOSIS — F17210 Nicotine dependence, cigarettes, uncomplicated: Secondary | ICD-10-CM | POA: Diagnosis present

## 2017-11-08 DIAGNOSIS — D61818 Other pancytopenia: Secondary | ICD-10-CM | POA: Diagnosis not present

## 2017-11-08 DIAGNOSIS — E860 Dehydration: Secondary | ICD-10-CM | POA: Diagnosis not present

## 2017-11-08 DIAGNOSIS — Z79899 Other long term (current) drug therapy: Secondary | ICD-10-CM

## 2017-11-08 DIAGNOSIS — K219 Gastro-esophageal reflux disease without esophagitis: Secondary | ICD-10-CM | POA: Diagnosis present

## 2017-11-08 DIAGNOSIS — C3490 Malignant neoplasm of unspecified part of unspecified bronchus or lung: Secondary | ICD-10-CM

## 2017-11-08 DIAGNOSIS — I700 Atherosclerosis of aorta: Secondary | ICD-10-CM | POA: Diagnosis not present

## 2017-11-08 DIAGNOSIS — R6520 Severe sepsis without septic shock: Secondary | ICD-10-CM | POA: Diagnosis present

## 2017-11-08 DIAGNOSIS — G8929 Other chronic pain: Secondary | ICD-10-CM | POA: Diagnosis not present

## 2017-11-08 DIAGNOSIS — D696 Thrombocytopenia, unspecified: Secondary | ICD-10-CM | POA: Diagnosis not present

## 2017-11-08 DIAGNOSIS — G629 Polyneuropathy, unspecified: Secondary | ICD-10-CM | POA: Diagnosis not present

## 2017-11-08 DIAGNOSIS — Z96651 Presence of right artificial knee joint: Secondary | ICD-10-CM | POA: Diagnosis present

## 2017-11-08 DIAGNOSIS — C787 Secondary malignant neoplasm of liver and intrahepatic bile duct: Secondary | ICD-10-CM | POA: Diagnosis present

## 2017-11-08 DIAGNOSIS — L89312 Pressure ulcer of right buttock, stage 2: Secondary | ICD-10-CM | POA: Diagnosis present

## 2017-11-08 DIAGNOSIS — R740 Nonspecific elevation of levels of transaminase and lactic acid dehydrogenase [LDH]: Secondary | ICD-10-CM | POA: Diagnosis not present

## 2017-11-08 DIAGNOSIS — R0609 Other forms of dyspnea: Secondary | ICD-10-CM

## 2017-11-08 DIAGNOSIS — R630 Anorexia: Secondary | ICD-10-CM | POA: Diagnosis not present

## 2017-11-08 DIAGNOSIS — D701 Agranulocytosis secondary to cancer chemotherapy: Secondary | ICD-10-CM | POA: Diagnosis not present

## 2017-11-08 DIAGNOSIS — R5081 Fever presenting with conditions classified elsewhere: Secondary | ICD-10-CM | POA: Diagnosis present

## 2017-11-08 DIAGNOSIS — Z7982 Long term (current) use of aspirin: Secondary | ICD-10-CM | POA: Diagnosis not present

## 2017-11-08 DIAGNOSIS — E119 Type 2 diabetes mellitus without complications: Secondary | ICD-10-CM

## 2017-11-08 DIAGNOSIS — Z5111 Encounter for antineoplastic chemotherapy: Secondary | ICD-10-CM | POA: Diagnosis not present

## 2017-11-08 DIAGNOSIS — M549 Dorsalgia, unspecified: Secondary | ICD-10-CM

## 2017-11-08 DIAGNOSIS — A419 Sepsis, unspecified organism: Principal | ICD-10-CM | POA: Diagnosis present

## 2017-11-08 DIAGNOSIS — I10 Essential (primary) hypertension: Secondary | ICD-10-CM | POA: Diagnosis present

## 2017-11-08 DIAGNOSIS — C3491 Malignant neoplasm of unspecified part of right bronchus or lung: Secondary | ICD-10-CM | POA: Diagnosis present

## 2017-11-08 DIAGNOSIS — L8990 Pressure ulcer of unspecified site, unspecified stage: Secondary | ICD-10-CM

## 2017-11-08 DIAGNOSIS — D6481 Anemia due to antineoplastic chemotherapy: Secondary | ICD-10-CM

## 2017-11-08 DIAGNOSIS — Z794 Long term (current) use of insulin: Secondary | ICD-10-CM | POA: Diagnosis not present

## 2017-11-08 DIAGNOSIS — C7951 Secondary malignant neoplasm of bone: Secondary | ICD-10-CM | POA: Diagnosis present

## 2017-11-08 DIAGNOSIS — R400 Somnolence: Secondary | ICD-10-CM | POA: Diagnosis not present

## 2017-11-08 DIAGNOSIS — R634 Abnormal weight loss: Secondary | ICD-10-CM | POA: Diagnosis not present

## 2017-11-08 DIAGNOSIS — D6959 Other secondary thrombocytopenia: Secondary | ICD-10-CM | POA: Diagnosis not present

## 2017-11-08 DIAGNOSIS — E872 Acidosis: Secondary | ICD-10-CM | POA: Diagnosis present

## 2017-11-08 DIAGNOSIS — R5383 Other fatigue: Secondary | ICD-10-CM

## 2017-11-08 DIAGNOSIS — N179 Acute kidney failure, unspecified: Secondary | ICD-10-CM | POA: Diagnosis present

## 2017-11-08 DIAGNOSIS — R0602 Shortness of breath: Secondary | ICD-10-CM

## 2017-11-08 DIAGNOSIS — R42 Dizziness and giddiness: Secondary | ICD-10-CM

## 2017-11-08 DIAGNOSIS — D61810 Antineoplastic chemotherapy induced pancytopenia: Secondary | ICD-10-CM | POA: Diagnosis present

## 2017-11-08 DIAGNOSIS — E701 Other hyperphenylalaninemias: Secondary | ICD-10-CM

## 2017-11-08 DIAGNOSIS — R509 Fever, unspecified: Secondary | ICD-10-CM

## 2017-11-08 DIAGNOSIS — R17 Unspecified jaundice: Secondary | ICD-10-CM

## 2017-11-08 DIAGNOSIS — L89322 Pressure ulcer of left buttock, stage 2: Secondary | ICD-10-CM | POA: Diagnosis present

## 2017-11-08 DIAGNOSIS — T451X5S Adverse effect of antineoplastic and immunosuppressive drugs, sequela: Secondary | ICD-10-CM | POA: Diagnosis not present

## 2017-11-08 DIAGNOSIS — M1990 Unspecified osteoarthritis, unspecified site: Secondary | ICD-10-CM

## 2017-11-08 DIAGNOSIS — D649 Anemia, unspecified: Secondary | ICD-10-CM | POA: Diagnosis not present

## 2017-11-08 DIAGNOSIS — L8992 Pressure ulcer of unspecified site, stage 2: Secondary | ICD-10-CM | POA: Diagnosis not present

## 2017-11-08 LAB — CBC WITH DIFFERENTIAL (CANCER CENTER ONLY)
BASOS PCT: 7 %
Band Neutrophils: 0 %
Basophils Absolute: 0 10*3/uL (ref 0.0–0.1)
Blasts: 0 %
EOS PCT: 0 %
Eosinophils Absolute: 0 10*3/uL (ref 0.0–0.5)
HCT: 20.3 % — ABNORMAL LOW (ref 38.4–49.9)
Hemoglobin: 7 g/dL — CL (ref 13.0–17.1)
LYMPHS ABS: 0.3 10*3/uL — AB (ref 0.9–3.3)
LYMPHS PCT: 61 %
MCH: 33 pg (ref 27.2–33.4)
MCHC: 34.2 g/dL (ref 32.0–36.0)
MCV: 96.7 fL (ref 79.3–98.0)
MONO ABS: 0.1 10*3/uL (ref 0.1–0.9)
Metamyelocytes Relative: 0 %
Monocytes Relative: 23 %
Myelocytes: 0 %
NEUTROS PCT: 9 %
NRBC: 0 /100{WBCs}
Neutro Abs: 0 10*3/uL — CL (ref 1.5–6.5)
OTHER: 0 %
PLATELETS: 5 10*3/uL — AB (ref 140–400)
Promyelocytes Relative: 0 %
RBC: 2.11 MIL/uL — AB (ref 4.20–5.82)
RDW: 17.9 % — ABNORMAL HIGH (ref 11.0–14.6)
WBC: 0.4 10*3/uL — AB (ref 4.0–10.3)

## 2017-11-08 LAB — CBC WITH DIFFERENTIAL/PLATELET
HEMATOCRIT: 19 % — AB (ref 39.0–52.0)
HEMOGLOBIN: 6.6 g/dL — AB (ref 13.0–17.0)
MCH: 33.2 pg (ref 26.0–34.0)
MCHC: 34.7 g/dL (ref 30.0–36.0)
MCV: 95.5 fL (ref 78.0–100.0)
Platelets: 6 10*3/uL — CL (ref 150–400)
RBC: 1.99 MIL/uL — ABNORMAL LOW (ref 4.22–5.81)
RDW: 17.3 % — ABNORMAL HIGH (ref 11.5–15.5)
WBC: 0.4 10*3/uL — AB (ref 4.0–10.5)

## 2017-11-08 LAB — CMP (CANCER CENTER ONLY)
ALT: 33 U/L (ref 0–44)
ANION GAP: 13 (ref 5–15)
AST: 24 U/L (ref 15–41)
Albumin: 2 g/dL — ABNORMAL LOW (ref 3.5–5.0)
Alkaline Phosphatase: 124 U/L (ref 38–126)
BUN: 20 mg/dL (ref 8–23)
CHLORIDE: 102 mmol/L (ref 98–111)
CO2: 18 mmol/L — AB (ref 22–32)
Calcium: 8.2 mg/dL — ABNORMAL LOW (ref 8.9–10.3)
Creatinine: 1.27 mg/dL — ABNORMAL HIGH (ref 0.61–1.24)
GFR, Estimated: 56 mL/min — ABNORMAL LOW (ref >60)
Glucose, Bld: 185 mg/dL — ABNORMAL HIGH (ref 70–99)
POTASSIUM: 4.5 mmol/L (ref 3.5–5.1)
SODIUM: 133 mmol/L — AB (ref 135–145)
Total Bilirubin: 11.9 mg/dL (ref 0.3–1.2)
Total Protein: 5.7 g/dL — ABNORMAL LOW (ref 6.5–8.1)

## 2017-11-08 LAB — COMPREHENSIVE METABOLIC PANEL
ALT: 32 U/L (ref 0–44)
AST: 32 U/L (ref 15–41)
Albumin: 2 g/dL — ABNORMAL LOW (ref 3.5–5.0)
Alkaline Phosphatase: 96 U/L (ref 38–126)
Anion gap: 10 (ref 5–15)
BUN: 23 mg/dL (ref 8–23)
CHLORIDE: 102 mmol/L (ref 98–111)
CO2: 23 mmol/L (ref 22–32)
CREATININE: 1.32 mg/dL — AB (ref 0.61–1.24)
Calcium: 8.2 mg/dL — ABNORMAL LOW (ref 8.9–10.3)
GFR calc Af Amer: >60 mL/min (ref >60)
GFR, EST NON AFRICAN AMERICAN: 53 mL/min — AB (ref >60)
GLUCOSE: 216 mg/dL — AB (ref 70–99)
Potassium: 4.9 mmol/L (ref 3.5–5.1)
Sodium: 135 mmol/L (ref 135–145)
Total Bilirubin: 11.6 mg/dL — ABNORMAL HIGH (ref 0.3–1.2)
Total Protein: 5.8 g/dL — ABNORMAL LOW (ref 6.5–8.1)

## 2017-11-08 LAB — PROTIME-INR
INR: 1.39
Prothrombin Time: 16.9 seconds — ABNORMAL HIGH (ref 11.4–15.2)

## 2017-11-08 LAB — TROPONIN I: TROPONIN I: 0.1 ng/mL — AB (ref <0.03)

## 2017-11-08 LAB — I-STAT CG4 LACTIC ACID, ED
LACTIC ACID, VENOUS: 2.5 mmol/L — AB (ref 0.5–1.9)
Lactic Acid, Venous: 3.32 mmol/L (ref 0.5–1.9)

## 2017-11-08 LAB — MRSA PCR SCREENING: MRSA BY PCR: NEGATIVE

## 2017-11-08 LAB — AMMONIA: Ammonia: 25 umol/L (ref 9–35)

## 2017-11-08 LAB — GLUCOSE, CAPILLARY: GLUCOSE-CAPILLARY: 145 mg/dL — AB (ref 70–99)

## 2017-11-08 LAB — LIPASE, BLOOD: LIPASE: 22 U/L (ref 11–51)

## 2017-11-08 LAB — PREPARE RBC (CROSSMATCH)

## 2017-11-08 MED ORDER — METHOCARBAMOL 500 MG PO TABS
500.0000 mg | ORAL_TABLET | Freq: Three times a day (TID) | ORAL | Status: DC
  Administered 2017-11-08 – 2017-11-12 (×11): 500 mg via ORAL
  Filled 2017-11-08 (×11): qty 1

## 2017-11-08 MED ORDER — NICOTINE 21 MG/24HR TD PT24
21.0000 mg | MEDICATED_PATCH | Freq: Every day | TRANSDERMAL | Status: DC
  Administered 2017-11-08 – 2017-11-12 (×5): 21 mg via TRANSDERMAL
  Filled 2017-11-08 (×5): qty 1

## 2017-11-08 MED ORDER — PANTOPRAZOLE SODIUM 40 MG PO TBEC
40.0000 mg | DELAYED_RELEASE_TABLET | Freq: Every day | ORAL | Status: DC
  Administered 2017-11-09 – 2017-11-12 (×4): 40 mg via ORAL
  Filled 2017-11-08 (×4): qty 1

## 2017-11-08 MED ORDER — IBUPROFEN 800 MG PO TABS
800.0000 mg | ORAL_TABLET | Freq: Once | ORAL | Status: AC
  Administered 2017-11-08: 800 mg via ORAL
  Filled 2017-11-08: qty 1

## 2017-11-08 MED ORDER — POLYETHYLENE GLYCOL 3350 17 G PO PACK: 17.0000 g | PACK | Freq: Every day | ORAL | Status: DC | PRN

## 2017-11-08 MED ORDER — SODIUM CHLORIDE 0.9 % IV SOLN
1750.0000 mg | INTRAVENOUS | Status: DC
  Administered 2017-11-09: 1750 mg via INTRAVENOUS
  Filled 2017-11-08 (×2): qty 1750

## 2017-11-08 MED ORDER — TRAMADOL HCL 50 MG PO TABS
50.0000 mg | ORAL_TABLET | Freq: Four times a day (QID) | ORAL | Status: DC | PRN
  Administered 2017-11-09 – 2017-11-10 (×2): 50 mg via ORAL
  Filled 2017-11-08 (×2): qty 1

## 2017-11-08 MED ORDER — IOPAMIDOL (ISOVUE-300) INJECTION 61%
INTRAVENOUS | Status: AC
  Filled 2017-11-08: qty 100

## 2017-11-08 MED ORDER — VANCOMYCIN HCL 10 G IV SOLR
2000.0000 mg | Freq: Once | INTRAVENOUS | Status: AC
  Administered 2017-11-08: 2000 mg via INTRAVENOUS
  Filled 2017-11-08: qty 2000

## 2017-11-08 MED ORDER — INSULIN ASPART 100 UNIT/ML ~~LOC~~ SOLN
0.0000 [IU] | Freq: Three times a day (TID) | SUBCUTANEOUS | Status: DC
  Administered 2017-11-09: 2 [IU] via SUBCUTANEOUS
  Administered 2017-11-10 (×2): 3 [IU] via SUBCUTANEOUS
  Administered 2017-11-11 (×3): 2 [IU] via SUBCUTANEOUS
  Administered 2017-11-12: 3 [IU] via SUBCUTANEOUS
  Administered 2017-11-12: 2 [IU] via SUBCUTANEOUS

## 2017-11-08 MED ORDER — ONDANSETRON HCL 4 MG PO TABS: 4.0000 mg | ORAL_TABLET | Freq: Four times a day (QID) | ORAL | Status: DC | PRN

## 2017-11-08 MED ORDER — SODIUM CHLORIDE 0.9 % IV BOLUS (SEPSIS)
1000.0000 mL | Freq: Once | INTRAVENOUS | Status: AC
  Administered 2017-11-08: 1000 mL via INTRAVENOUS

## 2017-11-08 MED ORDER — ACETAMINOPHEN 325 MG PO TABS: 650.0000 mg | ORAL_TABLET | Freq: Four times a day (QID) | ORAL | Status: DC | PRN

## 2017-11-08 MED ORDER — SODIUM CHLORIDE 0.9 % IV BOLUS (SEPSIS): 1000.0000 mL | Freq: Once | INTRAVENOUS | Status: DC

## 2017-11-08 MED ORDER — AZTREONAM 2 G IJ SOLR
2.0000 g | Freq: Once | INTRAMUSCULAR | Status: AC
  Administered 2017-11-08: 2 g via INTRAVENOUS
  Filled 2017-11-08: qty 2

## 2017-11-08 MED ORDER — ACETAMINOPHEN 650 MG RE SUPP: 650.0000 mg | Freq: Four times a day (QID) | RECTAL | Status: DC | PRN

## 2017-11-08 MED ORDER — SODIUM CHLORIDE 0.9 % IV BOLUS (SEPSIS)
1000.0000 mL | Freq: Once | INTRAVENOUS | Status: DC
  Administered 2017-11-08: 1000 mL via INTRAVENOUS

## 2017-11-08 MED ORDER — ONDANSETRON 8 MG PO TBDP: 8.0000 mg | ORAL_TABLET | Freq: Four times a day (QID) | ORAL | Status: DC | PRN

## 2017-11-08 MED ORDER — IOPAMIDOL (ISOVUE-300) INJECTION 61%
100.0000 mL | Freq: Once | INTRAVENOUS | Status: AC | PRN
  Administered 2017-11-08: 100 mL via INTRAVENOUS

## 2017-11-08 MED ORDER — ONDANSETRON HCL 4 MG/2ML IJ SOLN: 4.0000 mg | Freq: Four times a day (QID) | INTRAMUSCULAR | Status: DC | PRN

## 2017-11-08 MED ORDER — SODIUM CHLORIDE 0.9 % IV SOLN
2.0000 g | Freq: Three times a day (TID) | INTRAVENOUS | Status: DC
  Administered 2017-11-08 – 2017-11-11 (×8): 2 g via INTRAVENOUS
  Filled 2017-11-08 (×10): qty 2

## 2017-11-08 MED ORDER — LEVOFLOXACIN IN D5W 750 MG/150ML IV SOLN
750.0000 mg | INTRAVENOUS | Status: DC
  Administered 2017-11-08: 750 mg via INTRAVENOUS
  Filled 2017-11-08: qty 150

## 2017-11-08 MED ORDER — SODIUM CHLORIDE 0.9% IV SOLUTION
Freq: Once | INTRAVENOUS | Status: AC
  Administered 2017-11-09: 01:00:00 via INTRAVENOUS

## 2017-11-08 MED ORDER — OMEGA-3-ACID ETHYL ESTERS 1 G PO CAPS
1.0000 g | ORAL_CAPSULE | Freq: Two times a day (BID) | ORAL | Status: DC
  Administered 2017-11-09 – 2017-11-12 (×7): 1 g via ORAL
  Filled 2017-11-08 (×8): qty 1

## 2017-11-08 MED ORDER — OMEGA-3 FATTY ACIDS 1000 MG PO CAPS: 1.0000 g | ORAL_CAPSULE | Freq: Two times a day (BID) | ORAL | Status: DC

## 2017-11-08 MED ORDER — FLUTICASONE PROPIONATE 50 MCG/ACT NA SUSP: 1.0000 | Freq: Every day | NASAL | Status: DC | PRN

## 2017-11-08 NOTE — Progress Notes (Signed)
A consult was received from an ED physician for vancomycin per pharmacy dosing.  The patient's profile has been reviewed for ht/wt/allergies/indication/available labs.    A one time order has been placed for vancomycin 2000 mg IV once.  Further antibiotics/pharmacy consults should be ordered by admitting physician if indicated.                       Thank you, Lenis Noon, PharmD 11/08/2017  5:22 PM

## 2017-11-08 NOTE — ED Provider Notes (Signed)
[facility] DEPT Provider Note   CSN: [id number] Arrival date & time: 11/08/17  1655     History   Chief Complaint Chief Complaint  Patient presents with  . Fever  . Weakness    HPI Robert Compton is a 72 y.o. male.  Pt presents to the ED from the cancer center for fever, increased jaundice, decreased ms, and n/v.  Pt has a hx of extensive stage small cell lung cancer managed from [name].  The pt had his last chemo on 7/17 (carboplatin + etoposide).  The pt has not been feeling well since then.  He has not been eating/drinking.  N/v.  His urine has been dark.  Pt has been jaundiced, but that is worsening.  Pt felt hot at home.  He has a fever here 100.5.  The pt initially went to the cancer center who ordered blood cultures times 2 and levaquin IV which was started there.  Pt did get Neulasta with his last chemo treatment.       Past Medical History:  Diagnosis Date  . Arthritis   . Cancer ([place])   . Diabetes mellitus without complication ([place])   . Hypertension     Patient Active Problem List   Diagnosis Date Noted  . Febrile neutropenia ([place]) 11/08/2017  . Severe sepsis ([place]) 11/08/2017  . Encounter for antineoplastic chemotherapy   . Extensive stage primary small cell carcinoma of lung ([place]) 10/03/2017  . Liver masses   . Mass of upper lobe of right lung 09/28/2017  . Hyponatremia 09/27/2017  . Failure to thrive in adult 09/27/2017  . Metastatic cancer to liver ([place]) 09/27/2017  . DM (diabetes mellitus), type 2 ([place]) 09/27/2017  . Essential hypertension 09/27/2017    Past Surgical History:  Procedure Laterality Date  . JOINT REPLACEMENT Right    knee        Home Medications    Prior to Admission medications   Medication Sig Start Date End Date Taking? Authorizing Provider  aspirin 81 MG tablet Take 81 mg by mouth 2 (two) times daily.   Yes [provider]  fish oil-omega-3 fatty acids 1000 MG capsule Take 1 g  by mouth 2 (two) times daily.   Yes [provider]  insulin aspart (NOVOLOG) 100 UNIT/ML injection CBG < 70: implement hypoglycemia protocol CBG 70 - 120: 0 units CBG 121 - 150: 2 units CBG 151 - 200: 3 units CBG 201 - 250: 5 units CBG 251 - 300: 8 units CBG 301 - 350: 11 units CBG 351 - 400: 15 units CBG > 400: call MD 10/08/17  Yes Lavina Hamman, MD  Melatonin 3 MG CAPS Take 3 mg by mouth at bedtime.    Yes [provider]  metFORMIN (GLUCOPHAGE) 1000 MG tablet Take 1,000 mg by mouth 2 (two) times daily with a meal.   Yes [provider]  methocarbamol (ROBAXIN) 500 MG tablet Take 1 tablet (500 mg total) by mouth 3 (three) times daily. 10/08/17  Yes Lavina Hamman, MD  nicotine (NICODERM CQ - DOSED IN MG/24 HOURS) 21 mg/24hr patch Place 1 patch (21 mg total) onto the skin daily. 10/09/17  Yes Lavina Hamman, MD  omeprazole (PRILOSEC) 20 MG capsule Take 20 mg by mouth daily.   Yes [provider]  ondansetron (ZOFRAN-ODT) 8 MG disintegrating tablet Take 8 mg by mouth every 6 (six) hours as needed for nausea or vomiting. for nausea 09/25/17  Yes [provider]  Red Yeast Rice 600 MG TABS Take 1 tablet by mouth 2 (two) times daily.    Yes [provider]  traMADol (ULTRAM) 50 MG tablet Take 1 tablet (50 mg total) by mouth every 6 (six) hours as needed for moderate pain. 10/08/17  Yes Lavina Hamman, MD  fluticasone (FLONASE) 50 MCG/ACT nasal spray Place 1 spray into both nostrils daily as needed for allergies. 07/02/17   [provider]  polyethylene glycol (MIRALAX / GLYCOLAX) packet Take 17 g by mouth daily. Patient taking differently: Take 17 g by mouth daily as needed for moderate constipation.  10/09/17   Lavina Hamman, MD    Family History Family History  Problem Relation Age of Onset  . Diabetes Mother   . Colon cancer Father   . CAD Neg Hx     Social History Social History   Tobacco Use  . Smoking status: Current Every  Day Smoker    Packs/day: 1.50    Years: 8.00    Pack years: 12.00    Types: Cigarettes  . Smokeless tobacco: Never Used  Substance Use Topics  . Alcohol use: No  . Drug use: No     Allergies   Amoxicillin-pot clavulanate and Nitrofurantoin   Review of Systems Review of Systems  Constitutional: Positive for fever.  Gastrointestinal: Positive for nausea and vomiting.  Neurological: Positive for weakness.     Physical Exam Updated Vital Signs BP (!) 95/52   Pulse (!) 103   Temp (!) 100.5 F (38.1 C) (Oral)   Resp (!) 22   Ht 6\' 3"  (1.905 m)   Wt 121.1 kg (267 lb)   SpO2 95%   BMI 33.37 kg/m   Physical Exam  Constitutional: He is oriented to person, place, and time. He appears well-developed and well-nourished. He appears lethargic.  HENT:  Head: Normocephalic and atraumatic.  Right Ear: External ear normal.  Left Ear: External ear normal.  Nose: Nose normal.  Mouth/Throat: Mucous membranes are dry.  Eyes: Scleral icterus is present.  Neck: Normal range of motion. Neck supple.  Cardiovascular: Regular rhythm, normal heart sounds and intact distal pulses. Tachycardia present.  Pulmonary/Chest: Effort normal and breath sounds normal.  Abdominal: Soft. Bowel sounds are normal.  Musculoskeletal: Normal range of motion.  Neurological: He is oriented to person, place, and time. He appears lethargic.  Skin: Skin is warm. Capillary refill takes less than 2 seconds.  +jaundiced  Psychiatric: His behavior is normal.  Nursing note and vitals reviewed.    ED Treatments / Results  Labs (all labs ordered are listed, but only abnormal results are displayed) Labs Reviewed  COMPREHENSIVE METABOLIC PANEL - Abnormal; Notable for the following components:      Result Value   Glucose, Bld 216 (*)    Creatinine, Ser 1.32 (*)    Calcium 8.2 (*)    Total Protein 5.8 (*)    Albumin 2.0 (*)    Total Bilirubin 11.6 (*)    GFR calc non Af Amer 53 (*)    All other components  within normal limits  CBC WITH DIFFERENTIAL/PLATELET - Abnormal; Notable for the following components:   WBC 0.4 (*)    RBC 1.99 (*)    Hemoglobin 6.6 (*)    HCT 19.0 (*)    RDW 17.3 (*)    Platelets 6 (*)    All other components within normal limits  PROTIME-INR - Abnormal; Notable for the following components:   Prothrombin Time 16.9 (*)  All other components within normal limits  TROPONIN I - Abnormal; Notable for the following components:   Troponin I 0.10 (*)    All other components within normal limits  I-STAT CG4 LACTIC ACID, ED - Abnormal; Notable for the following components:   Lactic Acid, Venous 3.32 (*)    All other components within normal limits  URINE CULTURE  AMMONIA  LIPASE, BLOOD  URINALYSIS, ROUTINE W REFLEX MICROSCOPIC  CBC  BASIC METABOLIC PANEL  I-STAT CG4 LACTIC ACID, ED  TYPE AND SCREEN  PREPARE RBC (CROSSMATCH)  PREPARE PLATELET PHERESIS    EKG None  Radiology Dg Chest 2 View  Result Date: 11/08/2017 CLINICAL DATA:  Weakness and jaundice.  History of lung cancer. EXAM: CHEST - 2 VIEW COMPARISON:  October 04, 2017 FINDINGS: The heart size and mediastinal contours are within normal limits. Both lungs are clear. The visualized skeletal structures are unremarkable. IMPRESSION: No active cardiopulmonary disease. Electronically Signed   By: Dorise Bullion III M.D   On: 11/08/2017 18:55   Ct Abdomen Pelvis W Contrast  Result Date: 11/08/2017 CLINICAL DATA:  72 year old male with new onset jaundice, fatigue and weight loss. Patient with known metastatic small cell lung cancer, undergoing chemotherapy. EXAM: CT ABDOMEN AND PELVIS WITH CONTRAST TECHNIQUE: Multidetector CT imaging of the abdomen and pelvis was performed using the standard protocol following bolus administration of intravenous contrast. CONTRAST:  148mL ISOVUE-300 IOPAMIDOL (ISOVUE-300) INJECTION 61% COMPARISON:  10/21/2017 head CT, 09/23/2017 abdominal CT and prior studies FINDINGS: Lower chest:  Mild bibasilar atelectasis noted. Hepatobiliary: Multiple hepatic lesions are again noted do not appear significantly changed from prior studies. The largest lesion measures 3 cm (series 3: Image 31). No biliary dilatation. Gallbladder is unremarkable. There is no evidence of biliary dilatation. Pancreas: Unremarkable Spleen: Unremarkable Adrenals/Urinary Tract: The kidneys, adrenal glands and bladder are unremarkable except for a LEFT renal cyst. Stomach/Bowel: Apparent circumferential wall thickening of small bowel loops in the LEFT abdomen noted with mild adjacent inflammation. Probable associated areas of fluid within the LEFT abdomen noted, some adjacent to small bowel loops. There is no evidence of bowel obstruction or pneumoperitoneum. Stomach is unremarkable. Vascular/Lymphatic: Aortic atherosclerosis. No enlarged abdominal or pelvic lymph nodes. The visualized mesenteric arteries are patent. Reproductive: Prostate is unremarkable. Other: A small amount of free fluid within the pelvis is noted. Musculoskeletal: No discrete focal bony lesions identified. IMPRESSION: . 1. Apparent circumferential wall thickening of some small bowel loops within the LEFT abdomen with mild adjacent inflammation and small amount of adjacent fluid/pelvic ascites, likely representing enteritis with reactive fluid. No evidence of bowel obstruction or pneumoperitoneum. 2. Hepatic metastases again identified.  No biliary dilatation. 3.  Aortic Atherosclerosis (ICD10-I70.0). Electronically Signed   By: Margarette Canada M.D.   On: 11/08/2017 19:06    Procedures Procedures (including critical care time)  Medications Ordered in ED Medications  sodium chloride 0.9 % bolus 1,000 mL (0 mLs Intravenous Stopped 11/08/17 1959)    And  sodium chloride 0.9 % bolus 1,000 mL (1,000 mLs Intravenous New Bag/Given 11/08/17 1858)    And  sodium chloride 0.9 % bolus 1,000 mL (has no administration in time range)    And  sodium chloride 0.9 %  bolus 1,000 mL (has no administration in time range)  vancomycin (VANCOCIN) 2,000 mg in sodium chloride 0.9 % 500 mL IVPB (has no administration in time range)  0.9 %  sodium chloride infusion (Manually program via Guardrails IV Fluids) (has no administration in time range)  methocarbamol (  ROBAXIN) tablet 500 mg (has no administration in time range)  fluticasone (FLONASE) 50 MCG/ACT nasal spray 1 spray (has no administration in time range)  fish oil-omega-3 fatty acids capsule 1 g (has no administration in time range)  pantoprazole (PROTONIX) EC tablet 40 mg (has no administration in time range)  polyethylene glycol (MIRALAX / GLYCOLAX) packet 17 g (has no administration in time range)  traMADol (ULTRAM) tablet 50 mg (has no administration in time range)  nicotine (NICODERM CQ - dosed in mg/24 hours) patch 21 mg (has no administration in time range)  acetaminophen (TYLENOL) tablet 650 mg (has no administration in time range)    Or  acetaminophen (TYLENOL) suppository 650 mg (has no administration in time range)  ondansetron (ZOFRAN) tablet 4 mg (has no administration in time range)    Or  ondansetron (ZOFRAN) injection 4 mg (has no administration in time range)  insulin aspart (novoLOG) injection 0-15 Units (has no administration in time range)  aztreonam (AZACTAM) 2 g in sodium chloride 0.9 % 100 mL IVPB (has no administration in time range)  vancomycin (VANCOCIN) 1,750 mg in sodium chloride 0.9 % 500 mL IVPB (has no administration in time range)  ibuprofen (ADVIL,MOTRIN) tablet 800 mg (800 mg Oral Given 11/08/17 1803)  aztreonam (AZACTAM) 2 g in sodium chloride 0.9 % 100 mL IVPB (0 g Intravenous Stopped 11/08/17 1959)  iopamidol (ISOVUE-300) 61 % injection 100 mL (100 mLs Intravenous Contrast Given 11/08/17 1818)     Initial Impression / Assessment and Plan / ED Course  I have reviewed the triage vital signs and the nursing notes.  Pertinent labs & imaging results that were available  during my care of the patient were reviewed by me and considered in my medical decision making (see chart for details).    CRITICAL CARE Performed by: Isla Pence   Total critical care time: 45 minutes  Critical care time was exclusive of separately billable procedures and treating other patients.  Critical care was necessary to treat or prevent imminent or life-threatening deterioration.  Critical care was time spent personally by me on the following activities: development of treatment plan with patient and/or surrogate as well as nursing, discussions with consultants, evaluation of patient's response to treatment, examination of patient, obtaining history from patient or surrogate, ordering and performing treatments and interventions, ordering and review of laboratory studies, ordering and review of radiographic studies, pulse oximetry and re-evaluation of patient's condition.  Code sepsis called.  Abx for neutropenic fever given (Vancomycin and Aztreonam).  IV sepsis fluids given.  He does have a hx of SIADH and is normally fluid restricted, but he looked very dry on exam.  BP has remained stable.  Troponin slightly elevated, likely from demand.  No cp.  Pt d/w Dr. Alen Blew (oncology) who recommended 2 units pRBCs and 1 plt pack.  No further Neulasta.  After hydration, pt is much more alert.  He said he feels a little better.   Pt d/w Dr. Alcario Drought ([facility]) for admission.  Final Clinical Impressions(s) / ED Diagnoses   Final diagnoses:  Neutropenic fever ([place])  Pancytopenia ([place])  Hyperbilirubinemia  Small cell lung cancer ([place])  Liver metastasis [facility])  Dehydration    ED Discharge Orders    None       Isla Pence, MD 11/08/17 2010

## 2017-11-08 NOTE — ED Notes (Signed)
CRITICAL VALUE STICKER  CRITICAL VALUE: WBC, Hemoglobin, Platelets  RECEIVER (on-site recipient of call): Tirrell Buchberger, [place] NOTIFIED: 11/08/17 @ 18:33  MESSENGER (representative from lab): Ovid Curd  MD NOTIFIED: Dr. Gilford Raid  TIME OF NOTIFICATION: 18:34  RESPONSE: No new orders

## 2017-11-08 NOTE — Progress Notes (Signed)
Pt presents with jaundice, extreme fatigue, and dizziness for last several days.  Family states that pt hasn't been eating more than a few bites of food daily, isn't drinking, and sleeps often.  Pt appears lethargic, falls asleep during assessment and is difficult to understand.  No unilat weakness or facial droop noted.  Pt denies CP but reports SOB/DOE that has increased over time.  Reports nausea but denies V/D/C, says his urine is dark in color.  Febrile at 100 temp.  Oriented x4 but not alert.  PA Lucianne Lei aware.  Two sets of blood cultures drawn from [facility] and [facility], IV started in [place].  Abx started after BCx2 drawn.  Report given to ED RN Claiborne Billings

## 2017-11-08 NOTE — ED Notes (Signed)
ED TO INPATIENT HANDOFF REPORT  Name/Age/Gender Robert Compton 72 y.o. male  Code Status    Code Status Orders  (From admission, onward)        Start     Ordered   11/08/17 2005  Full code  Continuous     11/08/17 2005    Code Status History    Date Active Date Inactive Code Status Order ID Comments User Context   09/27/2017 2346 10/08/2017 2258 Full Code [id number]  [name], MD Inpatient    Advance Directive Documentation     Most Recent Value  Type of Advance Directive  Healthcare Power of [place], Living will  Pre-existing out of facility DNR order (yellow form or pink MOST form)  -  "MOST" Form in Place?  -      Home/SNF/Other Home  Chief Complaint anemia  Level of Care/Admitting Diagnosis ED Disposition    ED Disposition Condition [place]: [place] [100102]  Level of Care: Stepdown [14]  Admit to SDU based on following criteria: Hemodynamic compromise or significant risk of instability:  Patient requiring short term acute titration and management of vasoactive drips, and invasive monitoring (i.e., CVP and Arterial line).  Diagnosis: Febrile neutropenia [facility]) [[id number]]  Admitting Physician: Doreatha Massed  Attending Physician: [name] [phone]  Estimated length of stay: past midnight tomorrow  Certification:: I certify this patient will need inpatient services for at least 2 midnights  PT Class (Do Not Modify): Inpatient [101]  PT Acc Code (Do Not Modify): Private [1]       Medical History Past Medical History:  Diagnosis Date  . Arthritis   . Cancer ([place])   . Diabetes mellitus without complication ([place])   . Hypertension     Allergies Allergies  Allergen Reactions  . Amoxicillin-Pot Clavulanate Anaphylaxis  . Nitrofurantoin Anaphylaxis    IV Location/Drains/Wounds Patient Lines/Drains/Airways Status   Active Line/Drains/Airways    Name:   Placement date:   Placement time:    Site:   Days:   Peripheral IV 11/08/17 Left Antecubital   11/08/17    1633    Antecubital   less than 1   Peripheral IV 11/08/17 Right Wrist   11/08/17    1749    Wrist   less than 1   Incision (Closed) 09/30/17 Abdomen Right   09/30/17    2043     39   Wound / Incision (Open or Dehisced) 10/08/17 Other (Comment) Buttocks Medial split in skin inn the crease of the buttocks   10/08/17    1010    Buttocks   31          Labs/Imaging Results for orders placed or performed during the hospital encounter of 11/08/17 (from the past 48 hour(s))  Comprehensive metabolic panel     Status: Abnormal   Collection Time: 11/08/17  5:50 PM  Result Value Ref Range   Sodium 135 135 - 145 mmol/L   Potassium 4.9 3.5 - 5.1 mmol/L   Chloride 102 98 - 111 mmol/L   CO2 23 22 - 32 mmol/L   Glucose, Bld 216 (H) 70 - 99 mg/dL   BUN 23 8 - 23 mg/dL   Creatinine, Ser 1.32 (H) 0.61 - 1.24 mg/dL   Calcium 8.2 (L) 8.9 - 10.3 mg/dL   Total Protein 5.8 (L) 6.5 - 8.1 g/dL   Albumin 2.0 (L) 3.5 - 5.0 g/dL   AST 32 15 - 41  U/L   ALT 32 0 - 44 U/L   Alkaline Phosphatase 96 38 - 126 U/L   Total Bilirubin 11.6 (H) 0.3 - 1.2 mg/dL   GFR calc non Af Amer 53 (L) >60 mL/min   GFR calc Af Amer >60 >60 mL/min    Comment: (NOTE) The eGFR has been calculated using the CKD EPI equation. This calculation has not been validated in all clinical situations. eGFR's persistently <60 mL/min signify possible Chronic Kidney Disease.    Anion gap 10 5 - 15    Comment: Performed at [facility], [place] [address], [place], [place] [address]  CBC WITH DIFFERENTIAL     Status: Abnormal (Preliminary result)   Collection Time: 11/08/17  5:50 PM  Result Value Ref Range   WBC 0.4 (LL) 4.0 - 10.5 K/uL    Comment: REPEATED TO VERIFY CRITICAL RESULT CALLED TO, READ BACK BY AND VERIFIED WITH: M.TAI AT 1834 ON 11/08/17 BY N.THOMPSON    RBC 1.99 (L) 4.22 - 5.81 MIL/uL   Hemoglobin 6.6 (LL) 13.0 - 17.0 g/dL    Comment:  REPEATED TO VERIFY CRITICAL RESULT CALLED TO, READ BACK BY AND VERIFIED WITH: M.TAI AT 1834 ON 11/08/17 BY N.THOMPSON    HCT 19.0 (L) 39.0 - 52.0 %   MCV 95.5 78.0 - 100.0 fL   MCH 33.2 26.0 - 34.0 pg   MCHC 34.7 30.0 - 36.0 g/dL   RDW 17.3 (H) 11.5 - 15.5 %   Platelets 6 (LL) 150 - 400 K/uL    Comment: REPEATED TO VERIFY SPECIMEN CHECKED FOR CLOTS CRITICAL RESULT CALLED TO, READ BACK BY AND VERIFIED WITH: M.TAI AT 1834 ON 11/08/17 BY N.THOMPSON Performed at [facility], [place] [address] [place], [place] [address]    Neutrophils Relative % PENDING %   Neutro Abs PENDING 1.7 - 7.7 K/uL   Band Neutrophils PENDING %   Lymphocytes Relative PENDING %   Lymphs Abs PENDING 0.7 - 4.0 K/uL   Monocytes Relative PENDING %   Monocytes Absolute PENDING 0.1 - 1.0 K/uL   Eosinophils Relative PENDING %   Eosinophils Absolute PENDING 0.0 - 0.7 K/uL   Basophils Relative PENDING %   Basophils Absolute PENDING 0.0 - 0.1 K/uL   WBC Morphology PENDING    RBC Morphology PENDING    Smear Review PENDING    nRBC PENDING 0 /100 WBC   Metamyelocytes Relative PENDING %   Myelocytes PENDING %   Promyelocytes Relative PENDING %   Blasts PENDING %  Ammonia     Status: None   Collection Time: 11/08/17  5:50 PM  Result Value Ref Range   Ammonia 25 9 - 35 umol/L    Comment: Performed at [facility], [place] [address], [place], [place] [address]  Protime-INR     Status: Abnormal   Collection Time: 11/08/17  5:50 PM  Result Value Ref Range   Prothrombin Time 16.9 (H) 11.4 - 15.2 seconds   INR 1.39     Comment: Performed at [facility], [place] [address], [place], [place] [address]  Type and screen [place]     Status: None (Preliminary result)   Collection Time: 11/08/17  5:50 PM  Result Value Ref Range   ABO/RH(D) A POS    Antibody Screen NEG    Sample Expiration      11/11/2017 Performed at [facility], [place] [address], [place],  [address]  Unit Number [id number]    Blood Component Type RED CELLS,LR    Unit division 00    Status of Unit ALLOCATED    Transfusion Status OK TO TRANSFUSE    Crossmatch Result Compatible    Unit Number [id number]    Blood Component Type RED CELLS,LR    Unit division 00    Status of Unit ALLOCATED    Transfusion Status OK TO TRANSFUSE    Crossmatch Result Compatible   Troponin I     Status: Abnormal   Collection Time: 11/08/17  5:50 PM  Result Value Ref Range   Troponin I 0.10 (HH) <0.03 ng/mL    Comment: CRITICAL RESULT CALLED TO, READ BACK BY AND VERIFIED WITH: Erasmo Score [id number] @ 1062 BY J SCOTTON Performed at [facility], [place] [address], [place], [place] [address]   Lipase, blood     Status: None   Collection Time: 11/08/17  5:50 PM  Result Value Ref Range   Lipase 22 11 - 51 U/L    Comment: Performed at [facility], [place] [address], [place], [place] [address]  I-Stat CG4 Lactic Acid, ED  (not at  [facility])     Status: Abnormal   Collection Time: 11/08/17  5:56 PM  Result Value Ref Range   Lactic Acid, Venous 3.32 (HH) 0.5 - 1.9 mmol/L   Comment NOTIFIED PHYSICIAN   Prepare RBC     Status: None   Collection Time: 11/08/17  7:52 PM  Result Value Ref Range   Order Confirmation      ORDER PROCESSED BY BLOOD BANK Performed at [place] [address], [place], [place] [address]   I-Stat CG4 Lactic Acid, ED  (not at  [facility])     Status: Abnormal   Collection Time: 11/08/17  8:13 PM  Result Value Ref Range   Lactic Acid, Venous 2.50 (HH) 0.5 - 1.9 mmol/L   Comment NOTIFIED PHYSICIAN    Dg Chest 2 View  Result Date: 11/08/2017 CLINICAL DATA:  Weakness and jaundice.  History of lung cancer. EXAM: CHEST - 2 VIEW COMPARISON:  October 04, 2017 FINDINGS: The heart size and mediastinal contours are within normal limits. Both lungs are clear. The visualized skeletal structures are  unremarkable. IMPRESSION: No active cardiopulmonary disease. Electronically Signed   By: Dorise Bullion III M.D   On: 11/08/2017 18:55   Ct Abdomen Pelvis W Contrast  Result Date: 11/08/2017 CLINICAL DATA:  72 year old male with new onset jaundice, fatigue and weight loss. Patient with known metastatic small cell lung cancer, undergoing chemotherapy. EXAM: CT ABDOMEN AND PELVIS WITH CONTRAST TECHNIQUE: Multidetector CT imaging of the abdomen and pelvis was performed using the standard protocol following bolus administration of intravenous contrast. CONTRAST:  126m ISOVUE-300 IOPAMIDOL (ISOVUE-300) INJECTION 61% COMPARISON:  10/21/2017 head CT, 09/23/2017 abdominal CT and prior studies FINDINGS: Lower chest: Mild bibasilar atelectasis noted. Hepatobiliary: Multiple hepatic lesions are again noted do not appear significantly changed from prior studies. The largest lesion measures 3 cm (series 3: Image 31). No biliary dilatation. Gallbladder is unremarkable. There is no evidence of biliary dilatation. Pancreas: Unremarkable Spleen: Unremarkable Adrenals/Urinary Tract: The kidneys, adrenal glands and bladder are unremarkable except for a LEFT renal cyst. Stomach/Bowel: Apparent circumferential wall thickening of small bowel loops in the LEFT abdomen noted with mild adjacent inflammation. Probable associated areas of fluid within the LEFT abdomen noted, some adjacent to small bowel loops. There is no evidence of bowel obstruction or pneumoperitoneum. Stomach  is unremarkable. Vascular/Lymphatic: Aortic atherosclerosis. No enlarged abdominal or pelvic lymph nodes. The visualized mesenteric arteries are patent. Reproductive: Prostate is unremarkable. Other: A small amount of free fluid within the pelvis is noted. Musculoskeletal: No discrete focal bony lesions identified. IMPRESSION: . 1. Apparent circumferential wall thickening of some small bowel loops within the LEFT abdomen with mild adjacent inflammation and  small amount of adjacent fluid/pelvic ascites, likely representing enteritis with reactive fluid. No evidence of bowel obstruction or pneumoperitoneum. 2. Hepatic metastases again identified.  No biliary dilatation. 3.  Aortic Atherosclerosis (ICD10-I70.0). Electronically Signed   By: Margarette Canada M.D.   On: 11/08/2017 19:06    Pending Labs Unresulted Labs (From admission, onward)   Start     Ordered   11/09/17 0500  CBC  Tomorrow morning,   R     11/08/17 2005   11/09/17 6195  Basic metabolic panel  Tomorrow morning,   R     11/08/17 2005   11/08/17 1952  Prepare Pheresed Platelets  (Adult Blood Administration - Platelets (Pheresed))  Once,   R    Question Answer Comment  Number of Apheresis Units 1 unit (6-10 packs)   Transfusion Indications PLT Count </=10,000/mm   If emergent release call blood bank Not emergent release      11/08/17 1951   11/08/17 1716  Urinalysis, Routine w reflex microscopic  STAT,   STAT     11/08/17 1717   11/08/17 1716  Urine culture  STAT,   STAT     11/08/17 1717      Vitals/Pain Today's Vitals   11/08/17 1730 11/08/17 1900 11/08/17 1930 11/08/17 2015  BP: (!) 97/55 (!) 91/46 (!) 95/52 100/62  Pulse: (!) 110  (!) 103 99  Resp: (!) 24 (!) 22 (!) 22 (!) 21  Temp:      TempSrc:      SpO2: 97%  95% 96%  Weight:      Height:      PainSc:        Isolation Precautions No active isolations  Medications Medications  vancomycin (VANCOCIN) 2,000 mg in sodium chloride 0.9 % 500 mL IVPB (2,000 mg Intravenous New Bag/Given 11/08/17 2038)  0.9 %  sodium chloride infusion (Manually program via Guardrails IV Fluids) (has no administration in time range)  methocarbamol (ROBAXIN) tablet 500 mg (has no administration in time range)  fluticasone (FLONASE) 50 MCG/ACT nasal spray 1 spray (has no administration in time range)  fish oil-omega-3 fatty acids capsule 1 g (has no administration in time range)  pantoprazole (PROTONIX) EC tablet 40 mg (has no  administration in time range)  polyethylene glycol (MIRALAX / GLYCOLAX) packet 17 g (has no administration in time range)  traMADol (ULTRAM) tablet 50 mg (has no administration in time range)  nicotine (NICODERM CQ - dosed in mg/24 hours) patch 21 mg (has no administration in time range)  acetaminophen (TYLENOL) tablet 650 mg (has no administration in time range)    Or  acetaminophen (TYLENOL) suppository 650 mg (has no administration in time range)  ondansetron (ZOFRAN) tablet 4 mg (has no administration in time range)    Or  ondansetron (ZOFRAN) injection 4 mg (has no administration in time range)  insulin aspart (novoLOG) injection 0-15 Units (has no administration in time range)  aztreonam (AZACTAM) 2 g in sodium chloride 0.9 % 100 mL IVPB (has no administration in time range)  vancomycin (VANCOCIN) 1,750 mg in sodium chloride 0.9 % 500 mL IVPB (has no administration  in time range)  ibuprofen (ADVIL,MOTRIN) tablet 800 mg (800 mg Oral Given 11/08/17 1803)  sodium chloride 0.9 % bolus 1,000 mL (0 mLs Intravenous Stopped 11/08/17 1959)    And  sodium chloride 0.9 % bolus 1,000 mL (0 mLs Intravenous Stopped 11/08/17 2034)  aztreonam (AZACTAM) 2 g in sodium chloride 0.9 % 100 mL IVPB (0 g Intravenous Stopped 11/08/17 1959)  iopamidol (ISOVUE-300) 61 % injection 100 mL (100 mLs Intravenous Contrast Given 11/08/17 1818)    Mobility non-ambulatory

## 2017-11-08 NOTE — ED Notes (Signed)
Bed: WA06 Expected date:  Expected time:  Means of arrival:  Comments: Cancer center-   Virgel Manifold, MD 11/08/17 1700

## 2017-11-08 NOTE — Telephone Encounter (Signed)
Patient's son calls stating his father is not doing well, not eating, very nervous and shaky, easily startled almost like he doesn't see you.  Wants to know what he should do.  I explained [name] is out of the office but I will consult with another provider and call him back, he verbalized an understanding.

## 2017-11-08 NOTE — Patient Instructions (Signed)
Dehydration, Adult Dehydration is when there is not enough fluid or water in your body. This happens when you lose more fluids than you take in. Dehydration can range from mild to very bad. It should be treated right away to keep it from getting very bad. Symptoms of mild dehydration may include:  Thirst.  Dry lips.  Slightly dry mouth.  Dry, warm skin.  Dizziness. Symptoms of moderate dehydration may include:  Very dry mouth.  Muscle cramps.  Dark pee (urine). Pee may be the color of tea.  Your body making less pee.  Your eyes making fewer tears.  Heartbeat that is uneven or faster than normal (palpitations).  Headache.  Light-headedness, especially when you stand up from sitting.  Fainting (syncope). Symptoms of very bad dehydration may include:  Changes in skin, such as: ? Cold and clammy skin. ? Blotchy (mottled) or pale skin. ? Skin that does not quickly return to normal after being lightly pinched and let go (poor skin turgor).  Changes in body fluids, such as: ? Feeling very thirsty. ? Your eyes making fewer tears. ? Not sweating when body temperature is high, such as in hot weather. ? Your body making very little pee.  Changes in vital signs, such as: ? Weak pulse. ? Pulse that is more than 100 beats a minute when you are sitting still. ? Fast breathing. ? Low blood pressure.  Other changes, such as: ? Sunken eyes. ? Cold hands and feet. ? Confusion. ? Lack of energy (lethargy). ? Trouble waking up from sleep. ? Short-term weight loss. ? Unconsciousness. Follow these instructions at home:  If told by your doctor, drink an ORS: ? Make an ORS by using instructions on the package. ? Start by drinking small amounts, about  cup (120 mL) every 5-10 minutes. ? Slowly drink more until you have had the amount that your doctor said to have.  Drink enough clear fluid to keep your pee clear or pale yellow. If you were told to drink an ORS, finish the ORS  first, then start slowly drinking clear fluids. Drink fluids such as: ? Water. Do not drink only water by itself. Doing that can make the salt (sodium) level in your body get too low (hyponatremia). ? Ice chips. ? Fruit juice that you have added water to (diluted). ? Low-calorie sports drinks.  Avoid: ? Alcohol. ? Drinks that have a lot of sugar. These include high-calorie sports drinks, fruit juice that does not have water added, and soda. ? Caffeine. ? Foods that are greasy or have a lot of fat or sugar.  Take over-the-counter and prescription medicines only as told by your doctor.  Do not take salt tablets. Doing that can make the salt level in your body get too high (hypernatremia).  Eat foods that have minerals (electrolytes). Examples include bananas, oranges, potatoes, tomatoes, and spinach.  Keep all follow-up visits as told by your doctor. This is important. Contact a doctor if:  You have belly (abdominal) pain that: ? Gets worse. ? Stays in one area (localizes).  You have a rash.  You have a stiff neck.  You get angry or annoyed more easily than normal (irritability).  You are more sleepy than normal.  You have a harder time waking up than normal.  You feel: ? Weak. ? Dizzy. ? Very thirsty.  You have peed (urinated) only a small amount of very dark pee during 6-8 hours. Get help right away if:  You have symptoms of   very bad dehydration.  You cannot drink fluids without throwing up (vomiting).  Your symptoms get worse with treatment.  You have a fever.  You have a very bad headache.  You are throwing up or having watery poop (diarrhea) and it: ? Gets worse. ? Does not go away.  You have blood or something green (bile) in your throw-up.  You have blood in your poop (stool). This may cause poop to look black and tarry.  You have not peed in 6-8 hours.  You pass out (faint).  Your heart rate when you are sitting still is more than 100 beats a  minute.  You have trouble breathing. This information is not intended to replace advice given to you by your health care provider. Make sure you discuss any questions you have with your health care provider. Document Released: 01/27/2009 Document Revised: 10/21/2015 Document Reviewed: 05/27/2015 Elsevier Interactive Patient Education  2018 [employer]  

## 2017-11-08 NOTE — ED Notes (Signed)
EDP [place] notified of 3.32 POC Lactic.

## 2017-11-08 NOTE — Progress Notes (Signed)
[facility] Progress Note   Robert Compton [id number] 21-May-1945 72 y.o.  Robert Compton is managed by Dr. Fanny Bien. Robert Compton  Actively treated with chemotherapy/immunotherapy: yes  Current Therapy: Carboplatin and etoposide with Neulasta support  Last Treated: 10/29/2017 (cycle 2 days 1- 3) with Neulasta dosed on 11/02/2017  Assessment: Plan:    Febrile neutropenia ([place]) - Plan: Culture, Blood, Culture, Blood, levofloxacin (LEVAQUIN) IVPB 750 mg  Thrombocytopenia (H[facility])  Anemia due to antineoplastic chemotherapy  Hyperbilirubinemia  Fever, unspecified fever cause - Plan: Culture, Blood, Culture, Blood, levofloxacin (LEVAQUIN) IVPB 750 mg  Lethargy  Extensive stage primary small cell carcinoma of lung (HCC)   Febrile neutropenia: The patient had blood cultures x2 collected.  He was started on Levaquin 750 mg IV and was transported to the emergency room for evaluation and management.  A CBC was completed today which returned showing a WBC of 0.4 with an ANC of 0.0.  Thrombocytopenia and anemia: A CBC returned today with a hemoglobin of 6.6, hematocrit of 19, and platelet count of 6.  The patient was transported to the emergency room for evaluation and management.    Hyperbilirubinemia.  The patient was jaundice.  His chemistry panel returned with a bilirubin of 11.96.  The patient was transported to the emergency room for evaluation and management.  Extensive stage small cell lung cancer: The patient is status post cycle 2 days 1-3 of carboplatin and etoposide which was dosed on 10/29/2017.  He was dosed with Neulasta on 11/02/2017.  He was scheduled to see [name] in follow-up on 11/18/2017.  Please see After Visit Summary for patient specific instructions.  Future Appointments  Date Time Provider [place]  11/11/2017  1:45 PM [facility] 6 [facility] None  11/18/2017 10:30 AM [facility] 2 [facility] None  11/18/2017 11:00 AM Robert Bears, MD [facility] None  11/18/2017 12:00 PM [facility] INFUSION [facility] None  11/19/2017  8:00 AM [facility] INFUSION [facility] None  11/20/2017  8:00 AM [facility] INFUSION [facility] None  11/22/2017  2:00 PM [facility] [place] [facility] None  11/25/2017  1:45 PM [facility] 2 [facility] None  12/02/2017  1:45 PM [facility] 2 [facility] None  12/09/2017  9:45 AM [facility] LAB 1 [facility] None  12/09/2017 10:15 AM Robert Bears, MD [facility] None  12/09/2017 11:00 AM [facility] INFUSION [facility] None  12/10/2017  8:00 AM [facility] INFUSION [facility] None  12/11/2017  8:00 AM [facility] INFUSION [facility] None  12/13/2017  2:00 PM [facility] [place] FL[facility] None    Orders Placed This Encounter  Procedures  . Culture, Blood  . Culture, Blood       Subjective:   Patient ID:  Robert Compton is a 72 y.o. (DOB 06-22-1945) male.  Chief Complaint:  Chief Complaint  Patient presents with  . Fatigue    HPI Robert Compton is a 72 year old male with a history of an extensive stage small cell lung cancer who is managed by Dr. Fanny Bien. Robert Compton and is status post cycle 2 days 1-3 of carboplatin and etoposide which was dosed on 10/29/2017.  He was dosed with Neulasta on 11/02/2017.  He presents to the clinic today with his son and daughter.  According to his family the patient was noted to have increasing lethargy, somnolence, fatigue, dizziness with positional changes, shortness of breath, dyspnea on exertion, anorexia and jaundice over the last 24 hours.  He is more difficult to understand.  He has been taking tramadol for pain which has not been  helping.  According to his family the patient wants to transition to hospice.  He denies nausea and vomiting.  He took Zofran last evening.     Medications: I have reviewed the patient's current medications.  Allergies:  Allergies  Allergen Reactions  . Amoxicillin-Pot Clavulanate Anaphylaxis  . Nitrofurantoin  Anaphylaxis    Past Medical History:  Diagnosis Date  . Arthritis   . Cancer ([place])   . Diabetes mellitus without complication ([place])   . Hypertension     Past Surgical History:  Procedure Laterality Date  . JOINT REPLACEMENT Right    knee    Family History  Problem Relation Age of Onset  . Diabetes Mother   . Colon cancer Father   . CAD Neg Hx     Social History   Socioeconomic History  . Marital status: Single    Spouse name: Not on file  . Number of children: Not on file  . Years of education: Not on file  . Highest education level: Not on file  Occupational History  . Occupation: [employer] operational officer  . Financial resource strain: Not on file  . Food insecurity:    Worry: Not on file    Inability: Not on file  . Transportation needs:    Medical: Not on file    Non-medical: Not on file  Tobacco Use  . Smoking status: Current Every Day Smoker    Packs/day: 1.50    Years: 8.00    Pack years: 12.00    Types: Cigarettes  . Smokeless tobacco: Never Used  Substance and Sexual Activity  . Alcohol use: No  . Drug use: No  . Sexual activity: Not on file  Lifestyle  . Physical activity:    Days per week: Not on file    Minutes per session: Not on file  . Stress: Not on file  Relationships  . Social connections:    Talks on phone: Not on file    Gets together: Not on file    Attends religious service: Not on file    Active member of club or organization: Not on file    Attends meetings of clubs or organizations: Not on file    Relationship status: Not on file  . Intimate partner violence:    Fear of current or ex partner: Not on file    Emotionally abused: Not on file    Physically abused: Not on file    Forced sexual activity: Not on file  Other Topics Concern  . Not on file  Social History Narrative  . Not on file    Past Medical History, Surgical history, Social history, and Family history were reviewed and updated as appropriate.   Please  see review of systems for further details on the patient's review from today.   Review of Systems:  Review of Systems  Constitutional: Positive for appetite change and fatigue. Negative for chills, diaphoresis and fever.  HENT: Negative for trouble swallowing.   Respiratory: Positive for shortness of breath. Negative for cough and chest tightness.   Cardiovascular: Negative for chest pain and palpitations.  Gastrointestinal: Negative for abdominal pain, constipation, diarrhea, nausea and vomiting.  Skin: Positive for color change.       Jaundice  Neurological: Positive for dizziness and weakness. Negative for headaches.  Psychiatric/Behavioral: Positive for confusion and decreased concentration.    Objective:   Physical Exam:  BP (!) 104/51 (BP Location: Left Arm, Patient Position: Sitting)   Pulse (!) 116  Comment: RN Liza aware of pulse.  Temp 100 F (37.8 C) (Oral) Comment: RN Liza aware of temp.  Resp (!) 22   SpO2 95%  ECOG: 2  Physical Exam  Constitutional: He appears lethargic.  The patient is an adult male who is jaundice and appears to be chronically ill.  HENT:  Head: Normocephalic and atraumatic.  Eyes: Scleral icterus is present.  Cardiovascular: S1 normal and S2 normal. Tachycardia present.  Pulmonary/Chest: Effort normal and breath sounds normal. No respiratory distress. He has no wheezes. He has no rales.  Abdominal: He exhibits distension. There is no tenderness. There is no rebound and no guarding.  Neurological: He appears lethargic. Coordination (The patient is ambulating with the use of a wheelchair) abnormal.  Skin:  Jaundice    Lab Review:     Component Value Date/Time   NA 135 11/08/2017 1750   K 4.9 11/08/2017 1750   CL 102 11/08/2017 1750   CO2 23 11/08/2017 1750   GLUCOSE 216 (H) 11/08/2017 1750   BUN 23 11/08/2017 1750   CREATININE 1.32 (H) 11/08/2017 1750   CREATININE 1.27 (H) 11/08/2017 1506   CALCIUM 8.2 (L) 11/08/2017 1750   PROT 5.8  (L) 11/08/2017 1750   ALBUMIN 2.0 (L) 11/08/2017 1750   AST 32 11/08/2017 1750   AST 24 11/08/2017 1506   ALT 32 11/08/2017 1750   ALT 33 11/08/2017 1506   ALKPHOS 96 11/08/2017 1750   BILITOT 11.6 (H) 11/08/2017 1750   BILITOT 11.9 (HH) 11/08/2017 1506   GFRNONAA 53 (L) 11/08/2017 1750   GFRNONAA 56 (L) 11/08/2017 1506   GFRAA >60 11/08/2017 1750   GFRAA >60 11/08/2017 1506       Component Value Date/Time   WBC 0.4 (LL) 11/08/2017 1750   RBC 1.99 (L) 11/08/2017 1750   HGB 6.6 (LL) 11/08/2017 1750   HGB 7.0 (LL) 11/08/2017 1506   HCT 19.0 (L) 11/08/2017 1750   PLT 6 (LL) 11/08/2017 1750   PLT 5 (LL) 11/08/2017 1506   MCV 95.5 11/08/2017 1750   MCV 94.3 06/07/2012 1818   MCH 33.2 11/08/2017 1750   MCHC 34.7 11/08/2017 1750   RDW 17.3 (H) 11/08/2017 1750   LYMPHSABS PENDING 11/08/2017 1750   MONOABS PENDING 11/08/2017 1750   EOSABS PENDING 11/08/2017 1750   BASOSABS PENDING 11/08/2017 1750   -------------------------------  Imaging from last 24 hours (if applicable):  Radiology interpretation: Dg Eye Foreign Body  Result Date: 10/23/2017 CLINICAL DATA:  Metal working/exposure; clearance prior to MRI EXAM: ORBITS FOR FOREIGN BODY - 2 VIEW COMPARISON:  None. FINDINGS: Water's views thighs deviated to the left and to the right were obtained. No intraorbital radiopaque foreign body. There are small frontal osteomas in the left frontal sinus. Paranasal sinuses elsewhere clear. There is leftward deviation of the nasal septum. No fracture or dislocation. IMPRESSION: No evidence of metallic foreign body within the orbits. Electronically Signed   By: Lowella Grip III M.D.   On: 10/23/2017 16:03   Dg Chest 2 View  Result Date: 11/08/2017 CLINICAL DATA:  Weakness and jaundice.  History of lung cancer. EXAM: CHEST - 2 VIEW COMPARISON:  October 04, 2017 FINDINGS: The heart size and mediastinal contours are within normal limits. Both lungs are clear. The visualized skeletal structures  are unremarkable. IMPRESSION: No active cardiopulmonary disease. Electronically Signed   By: Dorise Bullion III M.D   On: 11/08/2017 18:55   Mr Brain W Wo Contrast  Result Date: 10/23/2017 CLINICAL DATA:  72  y/o M; lung cancer, evaluation for metastatic disease, SRS protocol. EXAM: MRI HEAD WITHOUT AND WITH CONTRAST TECHNIQUE: Multiplanar, multiecho pulse sequences of the brain and surrounding structures were obtained without and with intravenous contrast. CONTRAST:  45mL MULTIHANCE GADOBENATE DIMEGLUMINE 529 MG/ML IV SOLN COMPARISON:  10/21/2017 PET-CT. FINDINGS: Brain: Several nonspecific foci of T2 FLAIR hyperintense signal abnormality in subcortical and periventricular white matter are compatible with moderate chronic microvascular ischemic changes for age. Moderate brain parenchymal volume loss. No evidence for stroke, hemorrhage, mass effect, hydrocephalus, extra-axial collection, or herniation. After administration of intravenous contrast there is no abnormal enhancement of the brain. Vascular: Normal flow voids. Skull and upper cervical spine: Numerous enhancing lesions throughout the visible cervical spine and the calvarium predominantly posteriorly compatible with metastatic disease. Sinuses/Orbits: Negative. Other: None. IMPRESSION: 1. No intracranial metastatic disease identified. 2. Multiple enhancing lesions of the upper cervical spine and calvarium compatible with bony metastasis. No extra osseous extension of neoplasm. 3. Moderate chronic microvascular ischemic changes and parenchymal volume loss of the brain. Electronically Signed   By: Kristine Garbe M.D.   On: 10/23/2017 22:35   Nm Pet Image Initial (pi) Skull Base To Thigh  Result Date: 10/21/2017 CLINICAL DATA:  Initial treatment strategy for lung cancer. EXAM: NUCLEAR MEDICINE PET SKULL BASE TO THIGH TECHNIQUE: 15.03 mCi F-18 FDG was injected intravenously. Full-ring PET imaging was performed from the skull base to thigh after  the radiotracer. CT data was obtained and used for attenuation correction and anatomic localization. Fasting blood glucose: 113 mg/dl COMPARISON:  CT chest 09/23/2017 FINDINGS: Mediastinal blood pool activity: SUV max 3.42 NECK: No hypermetabolic lymph nodes in the neck. Incidental CT findings: none CHEST: Enlarged and hypermetabolic right paratracheal lymph nodes. Index right paratracheal lymph node measures 2.3 cm and has an SUV max of 7.46. Previously this measured 3.4 cm. Azygoesophageal lymph node measures 1.2 cm and has an SUV max of 4.73. Previously this lymph node measured 1.7 cm. No hypermetabolic supraclavicular or axillary lymph nodes. Pulmonary nodule in the right apex measures 1.1 cm and has an SUV max of 5.12. Previously this measured 1.4 cm. Right upper lobe perihilar nodule measures 1.5 cm and has an SUV max of 16.01. Previously this measured 1.6 cm. Perifissural nodule within the right middle lobe measures 7 mm and has an SUV max of 0.94. Incidental CT findings: Aortic atherosclerosis. Calcifications in the LAD, RCA and left circumflex coronary arteries noted. ABDOMEN/PELVIS: No abnormal radiotracer uptake identified within the liver. Specifically there are no areas of increased uptake to correspond with the biopsy-proven liver metastasis. No abnormal tracer uptake identified within the pancreas or spleen. The adrenal glands are unremarkable. No hypermetabolic lymph nodes within the abdomen or pelvis. Focal area of increased activity is identified within the anus within SUV max of 11.9. This is nonspecific and may be physiologic. Incidental CT findings: none SKELETON: There is diffuse radiotracer uptake identified throughout the axial and appendicular skeleton. No dominant foci of increased uptake identified. SUV max within the right iliac bone is equal to 4.63. Incidental CT findings: none IMPRESSION: 1. Interval response to therapy compared with CT CAP from 09/23/2017. There is been decrease in  size of right upper lobe pulmonary nodules and right paratracheal and posterior mediastinal lymph nodes. 2. Liver metastasis are again noted. No significant FDG uptake identified within these lesions above background liver activity. 3. There is diffuse radiotracer uptake throughout the axial and appendicular skeleton. Although nonspecific this may reflect treatment related changes. 4. Aortic atherosclerosis  and multi vessel coronary artery atherosclerotic calcifications. Electronically Signed   By: Kerby Moors M.D.   On: 10/21/2017 16:12        This case was discussed with Dr. Benay Spice. He expressed agreement with my management of this patient.

## 2017-11-08 NOTE — ED Triage Notes (Addendum)
Pt arrives from the cancer center--received carboplatin + etoposide neularta on 11/02/2017 tx for extensive stage small cell lung CA. Family reports increased weakness and jaundice. Per family, they believe it's the patients gallbladder as urine has been dark, decreased and stools light VS at cancer center: BP- 104/51 HR 116 RR 20 O2 95% room air Temp 100.0 oral  Blood cultures x2 were obtained at cancer center and levaquin 750 mg  initiated, currently infusing.  Bilirubin 11.9 WBC 0.4 Platelet 5

## 2017-11-08 NOTE — H&P (Signed)
History and Physical    Robert Compton EXB:[id number] DOB: 04/04/1946 DOA: 11/08/2017  PCP: [name], MD  Patient coming from: Home  I have personally briefly reviewed patient's old medical records in [place]  Chief Complaint: Febrile neutropenia  HPI: Robert Compton is a 72 y.o. male with medical history significant of SCLC with mets to liver causing jaundice.  Chemo with carboplatin and etoposide last week.  Increasing fatigue, jaundice, dizziness for past couple of days, decreased PO intake.  Sent in from cancer center for febrile neutropenia.   ED Course: Tm 100.5, initial BP 91 systolic improved to 102 systolic after IVF.  HR 116 now down to 99.  Pancytopenia with WBC 0.4, HGB 6.6, platelets of 6.  Trop 0.10.  Lactate of 3.3 initially, improved 2.5 after IVF.  Creat 1.3 up from 0.88.  Bili 11.6.  CT abd/pelvis suggests enteritis.   Review of Systems: As per HPI otherwise 10 point review of systems negative.   Past Medical History:  Diagnosis Date  . Arthritis   . Cancer ([place])   . Diabetes mellitus without complication ([place])   . Hypertension     Past Surgical History:  Procedure Laterality Date  . JOINT REPLACEMENT Right    knee     reports that he has been smoking cigarettes.  He has a 12.00 pack-year smoking history. He has never used smokeless tobacco. He reports that he does not drink alcohol or use drugs.  Allergies  Allergen Reactions  . Amoxicillin-Pot Clavulanate Anaphylaxis  . Nitrofurantoin Anaphylaxis    Family History  Problem Relation Age of Onset  . Diabetes Mother   . Colon cancer Father   . CAD Neg Hx      Prior to Admission medications   Medication Sig Start Date End Date Taking? Authorizing Provider  aspirin 81 MG tablet Take 81 mg by mouth 2 (two) times daily.   Yes [provider]  fish oil-omega-3 fatty acids 1000 MG capsule Take 1 g by mouth 2 (two) times daily.   Yes [provider]  insulin aspart  (NOVOLOG) 100 UNIT/ML injection CBG < 70: implement hypoglycemia protocol CBG 70 - 120: 0 units CBG 121 - 150: 2 units CBG 151 - 200: 3 units CBG 201 - 250: 5 units CBG 251 - 300: 8 units CBG 301 - 350: 11 units CBG 351 - 400: 15 units CBG > 400: call MD 10/08/17  Yes Lavina Hamman, MD  Melatonin 3 MG CAPS Take 3 mg by mouth at bedtime.    Yes [provider]  metFORMIN (GLUCOPHAGE) 1000 MG tablet Take 1,000 mg by mouth 2 (two) times daily with a meal.   Yes [provider]  methocarbamol (ROBAXIN) 500 MG tablet Take 1 tablet (500 mg total) by mouth 3 (three) times daily. 10/08/17  Yes Lavina Hamman, MD  nicotine (NICODERM CQ - DOSED IN MG/24 HOURS) 21 mg/24hr patch Place 1 patch (21 mg total) onto the skin daily. 10/09/17  Yes Lavina Hamman, MD  omeprazole (PRILOSEC) 20 MG capsule Take 20 mg by mouth daily.   Yes [provider]  ondansetron (ZOFRAN-ODT) 8 MG disintegrating tablet Take 8 mg by mouth every 6 (six) hours as needed for nausea or vomiting. for nausea 09/25/17  Yes [provider]  Red Yeast Rice 600 MG TABS Take 1 tablet by mouth 2 (two) times daily.    Yes [provider]  traMADol (ULTRAM) 50 MG tablet Take 1 tablet (  50 mg total) by mouth every 6 (six) hours as needed for moderate pain. 10/08/17  Yes Lavina Hamman, MD  fluticasone (FLONASE) 50 MCG/ACT nasal spray Place 1 spray into both nostrils daily as needed for allergies. 07/02/17   [provider]  polyethylene glycol (MIRALAX / GLYCOLAX) packet Take 17 g by mouth daily. Patient taking differently: Take 17 g by mouth daily as needed for moderate constipation.  10/09/17   Lavina Hamman, MD    Physical Exam: Vitals:   11/08/17 1730 11/08/17 1900 11/08/17 1930 11/08/17 2015  BP: (!) 97/55 (!) 91/46 (!) 95/52 100/62  Pulse: (!) 110  (!) 103 99  Resp: (!) 24 (!) 22 (!) 22 (!) 21  Temp:      TempSrc:      SpO2: 97%  95% 96%  Weight:      Height:        Constitutional:  NAD, calm, comfortable, jaundiced Eyes: PERRL, lids and conjunctivae Icteric ENMT: Mucous membranes are moist. Posterior pharynx clear of any exudate or lesions.Normal dentition.  Neck: normal, supple, no masses, no thyromegaly Respiratory: clear to auscultation bilaterally, no wheezing, no crackles. Normal respiratory effort. No accessory muscle use.  Cardiovascular: Regular rate and rhythm, no murmurs / rubs / gallops. No extremity edema. 2+ pedal pulses. No carotid bruits.  Abdomen: no tenderness, no masses palpated. No hepatosplenomegaly. Bowel sounds positive.  Musculoskeletal: no clubbing / cyanosis. No joint deformity upper and lower extremities. Good ROM, no contractures. Normal muscle tone.  Skin: no rashes, does have diffuse bruising Neurologic: CN 2-12 grossly intact. Sensation intact, DTR normal. Strength 5/5 in all 4.  Psychiatric: Normal judgment and insight. Alert and oriented x 3. Normal mood.    Labs on Admission: I have personally reviewed following labs and imaging studies  CBC: Recent Labs  Lab 11/04/17 1331 11/08/17 1506 11/08/17 1750  WBC 4.9 0.4* 0.4*  NEUTROABS 4.1 0.0* PENDING  HGB 9.6* 7.0* 6.6*  HCT 28.3* 20.3* 19.0*  MCV 95.6 96.7 95.5  PLT 63* 5* 6*   Basic Metabolic Panel: Recent Labs  Lab 11/04/17 1331 11/08/17 1506 11/08/17 1750  NA 136 133* 135  K 4.9 4.5 4.9  CL 103 102 102  CO2 24 18* 23  GLUCOSE 216* 185* 216*  BUN 19 20 23   CREATININE 0.88 1.27* 1.32*  CALCIUM 8.5* 8.2* 8.2*   GFR: Estimated Creatinine Clearance: 73 mL/min (A) (by C-G formula based on SCr of 1.32 mg/dL (H)). Liver Function Tests: Recent Labs  Lab 11/04/17 1331 11/08/17 1506 11/08/17 1750  AST 53* 24 32  ALT 48* 33 32  ALKPHOS 201* 124 96  BILITOT 9.2* 11.9* 11.6*  PROT 6.1* 5.7* 5.8*  ALBUMIN 2.3* 2.0* 2.0*   Recent Labs  Lab 11/08/17 1750  LIPASE 22   Recent Labs  Lab 11/08/17 1750  AMMONIA 25   Coagulation Profile: Recent Labs  Lab  11/08/17 1750  INR 1.39   Cardiac Enzymes: Recent Labs  Lab 11/08/17 1750  TROPONINI 0.10*   BNP (last 3 results) No results for input(s): PROBNP in the last 8760 hours. HbA1C: No results for input(s): HGBA1C in the last 72 hours. CBG: No results for input(s): GLUCAP in the last 168 hours. Lipid Profile: No results for input(s): CHOL, HDL, LDLCALC, TRIG, CHOLHDL, LDLDIRECT in the last 72 hours. Thyroid Function Tests: No results for input(s): TSH, T4TOTAL, FREET4, T3FREE, THYROIDAB in the last 72 hours. Anemia Panel: No results for input(s): VITAMINB12, FOLATE, FERRITIN, TIBC, IRON,  RETICCTPCT in the last 72 hours. Urine analysis:    Component Value Date/Time   COLORURINE AMBER (A) 09/28/2017 0030   APPEARANCEUR CLEAR 09/28/2017 0030   LABSPEC 1.017 09/28/2017 0030   PHURINE 5.0 09/28/2017 0030   GLUCOSEU 50 (A) 09/28/2017 0030   HGBUR SMALL (A) 09/28/2017 0030   BILIRUBINUR MODERATE (A) 09/28/2017 0030   KETONESUR NEGATIVE 09/28/2017 0030   PROTEINUR 100 (A) 09/28/2017 0030   NITRITE NEGATIVE 09/28/2017 0030   LEUKOCYTESUR NEGATIVE 09/28/2017 0030    Radiological Exams on Admission: Dg Chest 2 View  Result Date: 11/08/2017 CLINICAL DATA:  Weakness and jaundice.  History of lung cancer. EXAM: CHEST - 2 VIEW COMPARISON:  October 04, 2017 FINDINGS: The heart size and mediastinal contours are within normal limits. Both lungs are clear. The visualized skeletal structures are unremarkable. IMPRESSION: No active cardiopulmonary disease. Electronically Signed   By: Dorise Bullion III M.D   On: 11/08/2017 18:55   Ct Abdomen Pelvis W Contrast  Result Date: 11/08/2017 CLINICAL DATA:  72 year old male with new onset jaundice, fatigue and weight loss. Patient with known metastatic small cell lung cancer, undergoing chemotherapy. EXAM: CT ABDOMEN AND PELVIS WITH CONTRAST TECHNIQUE: Multidetector CT imaging of the abdomen and pelvis was performed using the standard protocol following  bolus administration of intravenous contrast. CONTRAST:  153m ISOVUE-300 IOPAMIDOL (ISOVUE-300) INJECTION 61% COMPARISON:  10/21/2017 head CT, 09/23/2017 abdominal CT and prior studies FINDINGS: Lower chest: Mild bibasilar atelectasis noted. Hepatobiliary: Multiple hepatic lesions are again noted do not appear significantly changed from prior studies. The largest lesion measures 3 cm (series 3: Image 31). No biliary dilatation. Gallbladder is unremarkable. There is no evidence of biliary dilatation. Pancreas: Unremarkable Spleen: Unremarkable Adrenals/Urinary Tract: The kidneys, adrenal glands and bladder are unremarkable except for a LEFT renal cyst. Stomach/Bowel: Apparent circumferential wall thickening of small bowel loops in the LEFT abdomen noted with mild adjacent inflammation. Probable associated areas of fluid within the LEFT abdomen noted, some adjacent to small bowel loops. There is no evidence of bowel obstruction or pneumoperitoneum. Stomach is unremarkable. Vascular/Lymphatic: Aortic atherosclerosis. No enlarged abdominal or pelvic lymph nodes. The visualized mesenteric arteries are patent. Reproductive: Prostate is unremarkable. Other: A small amount of free fluid within the pelvis is noted. Musculoskeletal: No discrete focal bony lesions identified. IMPRESSION: . 1. Apparent circumferential wall thickening of some small bowel loops within the LEFT abdomen with mild adjacent inflammation and small amount of adjacent fluid/pelvic ascites, likely representing enteritis with reactive fluid. No evidence of bowel obstruction or pneumoperitoneum. 2. Hepatic metastases again identified.  No biliary dilatation. 3.  Aortic Atherosclerosis (ICD10-I70.0). Electronically Signed   By: JMargarette CanadaM.D.   On: 11/08/2017 19:06    EKG: Independently reviewed.  Assessment/Plan Principal Problem:   Severe sepsis (HCC) Active Problems:   Metastatic cancer to liver (HCC)   DM (diabetes mellitus), type 2 (HCC)    Extensive stage primary small cell carcinoma of lung (HCC)   Febrile neutropenia (HCC)   Antineoplastic chemotherapy induced pancytopenia [place]   [place]    1. Severe sepsis - associated febrile neutropenia 1. Aztreonam / vanc 2. BCx pending 3. IVF: 2L in ED then 2u PRBC and 1 pack platelets, + ABx 1. Goal BP already met after 2L with SBP over 100 and HR under 100 (even before vanc and transfusions have been started). 4. Tele monitor 5. Repeat CBC in AM 6. Trend lactates 2. Pancytopenia - 1. 2u PRBC 2. 1 platelets 3.  Got neulasta after chemo last week 3. Mild AKI - likely pre-renal 1. IVF 2. Monitor BMP 4. SCLC - 1. On chemo as per HPI 2. Oncology consulted 5. Jaundice - 1. Likely due to mets to liver 2. AST / ALT nl, alk phos 96 actually down from earlier this month. 6. DM2 - 1. Hold metformin 2. SSI AC, mod scale  DVT prophylaxis: SCDs - thrombocytopenia Code Status: Full Family Communication: Family at bedside Disposition Plan: Home after admit Consults called: Oncology Admission status: Admit to inpatient - IP status for febrile neutropenia   [name] DO [facility] Pager [phone] Only works nights!  If 7AM-7PM, please contact the primary day team physician taking care of patient  [email or web address] Password [facility]  11/08/2017, 8:35 PM

## 2017-11-08 NOTE — Progress Notes (Signed)
Pharmacy Antibiotic Note  Robert Compton is a 72 y.o. male admitted on 11/08/2017 with febrile neutropenia.  Pharmacy has been consulted for vancomycin + aztreonam dosing.  Patient has small cell carcinoma of the lung undergoing chemotherapy with carboplatin and etoposide. Patient admitted for febrile neutropenia. Patient has PCN allergy.  Today, 11/08/17  WBC 0.4  Tmax 100.5  SCr 1.3, CrCl 73 mL/min  Plan:  Aztreonam 2 g IV q8h  Vancomycin 2000 mg loading dose (20 mg/kg adjusted body weight of 92 kg) followed by vancomycin 1750 mg IV q24h  Goal AUC 400-500  Follow renal function, cultures, and vancomycin levels once at steady state  Height: 6\' 3"  (190.5 cm) Weight: 267 lb (121.1 kg) IBW/kg (Calculated) : 84.5  Temp (24hrs), Avg:100.3 F (37.9 C), Min:100 F (37.8 C), Max:100.5 F (38.1 C)  Recent Labs  Lab 11/04/17 1331 11/08/17 1506 11/08/17 1750 11/08/17 1756  WBC 4.9 0.4* 0.4*  --   CREATININE 0.88 1.27* 1.32*  --   LATICACIDVEN  --   --   --  3.32*    Estimated Creatinine Clearance: 73 mL/min (A) (by C-G formula based on SCr of 1.32 mg/dL (H)).    Allergies  Allergen Reactions  . Amoxicillin-Pot Clavulanate Anaphylaxis  . Nitrofurantoin Anaphylaxis   Antimicrobials this admission: 7/26 vancomycin >>  7/26 aztreonam >>  7/26 levofloxacin >>  Dose adjustments this admission:  Microbiology results: 7/26 UCx: Sent  7/26 Sputum: Sent (drawn PTA)   Thank you for allowing pharmacy to be a part of this patient's care.  Lenis Noon , PharmD, BCPS Clinical Pharmacist 11/08/2017 8:12 PM

## 2017-11-08 NOTE — Telephone Encounter (Signed)
Consulted with Robert Bussing NP patient will be seen in [facility], called son instructed him to have the patient here by 3:00 pm to be seen by Sandi Mealy PA, he verbalized an understanding.

## 2017-11-09 DIAGNOSIS — A419 Sepsis, unspecified organism: Principal | ICD-10-CM

## 2017-11-09 DIAGNOSIS — Z79899 Other long term (current) drug therapy: Secondary | ICD-10-CM

## 2017-11-09 DIAGNOSIS — C3490 Malignant neoplasm of unspecified part of unspecified bronchus or lung: Secondary | ICD-10-CM

## 2017-11-09 DIAGNOSIS — R6520 Severe sepsis without septic shock: Secondary | ICD-10-CM

## 2017-11-09 DIAGNOSIS — D696 Thrombocytopenia, unspecified: Secondary | ICD-10-CM

## 2017-11-09 DIAGNOSIS — L8992 Pressure ulcer of unspecified site, stage 2: Secondary | ICD-10-CM

## 2017-11-09 DIAGNOSIS — L8990 Pressure ulcer of unspecified site, unspecified stage: Secondary | ICD-10-CM

## 2017-11-09 DIAGNOSIS — D649 Anemia, unspecified: Secondary | ICD-10-CM

## 2017-11-09 DIAGNOSIS — T451X5A Adverse effect of antineoplastic and immunosuppressive drugs, initial encounter: Secondary | ICD-10-CM

## 2017-11-09 DIAGNOSIS — N179 Acute kidney failure, unspecified: Secondary | ICD-10-CM

## 2017-11-09 DIAGNOSIS — D61810 Antineoplastic chemotherapy induced pancytopenia: Secondary | ICD-10-CM

## 2017-11-09 DIAGNOSIS — R5081 Fever presenting with conditions classified elsewhere: Secondary | ICD-10-CM

## 2017-11-09 DIAGNOSIS — D709 Neutropenia, unspecified: Secondary | ICD-10-CM

## 2017-11-09 DIAGNOSIS — C787 Secondary malignant neoplasm of liver and intrahepatic bile duct: Secondary | ICD-10-CM

## 2017-11-09 DIAGNOSIS — D61818 Other pancytopenia: Secondary | ICD-10-CM

## 2017-11-09 DIAGNOSIS — E119 Type 2 diabetes mellitus without complications: Secondary | ICD-10-CM

## 2017-11-09 LAB — GLUCOSE, CAPILLARY
Glucose-Capillary: 112 mg/dL — ABNORMAL HIGH (ref 70–99)
Glucose-Capillary: 121 mg/dL — ABNORMAL HIGH (ref 70–99)
Glucose-Capillary: 110 mg/dL — ABNORMAL HIGH (ref 70–99)
Glucose-Capillary: 132 mg/dL — ABNORMAL HIGH (ref 70–99)

## 2017-11-09 LAB — DIFFERENTIAL
BASOS PCT: 2 %
Basophils Absolute: 0 10*3/uL (ref 0.0–0.1)
EOS PCT: 1 %
Eosinophils Absolute: 0 10*3/uL (ref 0.0–0.7)
LYMPHS PCT: 66 %
Lymphs Abs: 0.6 10*3/uL — ABNORMAL LOW (ref 0.7–4.0)
MONOS PCT: 24 %
Monocytes Absolute: 0.2 10*3/uL (ref 0.1–1.0)
NEUTROS ABS: 0.1 10*3/uL — AB (ref 1.7–7.7)
Neutrophils Relative %: 7 %

## 2017-11-09 LAB — CBC
HCT: 22.5 % — ABNORMAL LOW (ref 39.0–52.0)
Hemoglobin: 7.8 g/dL — ABNORMAL LOW (ref 13.0–17.0)
MCH: 32 pg (ref 26.0–34.0)
MCHC: 34.7 g/dL (ref 30.0–36.0)
MCV: 92.2 fL (ref 78.0–100.0)
PLATELETS: 9 10*3/uL — AB (ref 150–400)
RBC: 2.44 MIL/uL — ABNORMAL LOW (ref 4.22–5.81)
RDW: 18 % — ABNORMAL HIGH (ref 11.5–15.5)
WBC: 0.9 10*3/uL — CL (ref 4.0–10.5)

## 2017-11-09 LAB — URINALYSIS, ROUTINE W REFLEX MICROSCOPIC
GLUCOSE, UA: NEGATIVE mg/dL
HGB URINE DIPSTICK: NEGATIVE
KETONES UR: NEGATIVE mg/dL
Leukocytes, UA: NEGATIVE
Nitrite: NEGATIVE
PROTEIN: NEGATIVE mg/dL
Specific Gravity, Urine: 1.033 — ABNORMAL HIGH (ref 1.005–1.030)
pH: 5 (ref 5.0–8.0)

## 2017-11-09 LAB — CBC WITH DIFFERENTIAL/PLATELET
Basophils Absolute: 0 10*3/uL (ref 0.0–0.1)
Basophils Relative: 2 %
Eosinophils Absolute: 0 10*3/uL (ref 0.0–0.7)
Eosinophils Relative: 1 %
HEMATOCRIT: 21 % — AB (ref 39.0–52.0)
HEMOGLOBIN: 7.3 g/dL — AB (ref 13.0–17.0)
LYMPHS ABS: 0.6 10*3/uL — AB (ref 0.7–4.0)
Lymphocytes Relative: 54 %
MCH: 31.2 pg (ref 26.0–34.0)
MCHC: 34.8 g/dL (ref 30.0–36.0)
MCV: 89.7 fL (ref 78.0–100.0)
MONOS PCT: 23 %
Monocytes Absolute: 0.3 10*3/uL (ref 0.1–1.0)
NEUTROS ABS: 0.2 10*3/uL — AB (ref 1.7–7.7)
NEUTROS PCT: 21 %
Platelets: 23 10*3/uL — CL (ref 150–400)
RBC: 2.34 MIL/uL — ABNORMAL LOW (ref 4.22–5.81)
RDW: 18.2 % — ABNORMAL HIGH (ref 11.5–15.5)
WBC: 1.1 10*3/uL — CL (ref 4.0–10.5)

## 2017-11-09 LAB — BASIC METABOLIC PANEL
ANION GAP: 9 (ref 5–15)
BUN: 29 mg/dL — AB (ref 8–23)
CO2: 23 mmol/L (ref 22–32)
Calcium: 8 mg/dL — ABNORMAL LOW (ref 8.9–10.3)
Chloride: 105 mmol/L (ref 98–111)
Creatinine, Ser: 1.16 mg/dL (ref 0.61–1.24)
GFR calc non Af Amer: >60 mL/min (ref >60)
Glucose, Bld: 152 mg/dL — ABNORMAL HIGH (ref 70–99)
POTASSIUM: 4.4 mmol/L (ref 3.5–5.1)
SODIUM: 137 mmol/L (ref 135–145)

## 2017-11-09 LAB — LACTIC ACID, PLASMA: Lactic Acid, Venous: 2 mmol/L (ref 0.5–1.9)

## 2017-11-09 LAB — HEPATIC FUNCTION PANEL
ALT: 29 U/L (ref 0–44)
AST: 23 U/L (ref 15–41)
Albumin: 2 g/dL — ABNORMAL LOW (ref 3.5–5.0)
Alkaline Phosphatase: 91 U/L (ref 38–126)
BILIRUBIN DIRECT: 5.8 mg/dL — AB (ref 0.0–0.2)
Indirect Bilirubin: 3.8 mg/dL — ABNORMAL HIGH (ref 0.3–0.9)
Total Bilirubin: 9.6 mg/dL — ABNORMAL HIGH (ref 0.3–1.2)
Total Protein: 5.6 g/dL — ABNORMAL LOW (ref 6.5–8.1)

## 2017-11-09 LAB — MAGNESIUM: Magnesium: 1.7 mg/dL (ref 1.7–2.4)

## 2017-11-09 MED ORDER — SODIUM CHLORIDE 0.9 % IV SOLN: INTRAVENOUS | Status: DC

## 2017-11-09 MED ORDER — MAGNESIUM SULFATE 4 GM/100ML IV SOLN
4.0000 g | Freq: Once | INTRAVENOUS | Status: AC
  Administered 2017-11-09: 4 g via INTRAVENOUS
  Filled 2017-11-09: qty 100

## 2017-11-09 MED ORDER — SODIUM CHLORIDE 0.9 % IV SOLN
INTRAVENOUS | Status: DC
  Filled 2017-11-09 (×4): qty 1000

## 2017-11-09 MED ORDER — METRONIDAZOLE IN NACL 5-0.79 MG/ML-% IV SOLN
500.0000 mg | Freq: Three times a day (TID) | INTRAVENOUS | Status: DC
  Administered 2017-11-09 – 2017-11-11 (×6): 500 mg via INTRAVENOUS
  Filled 2017-11-09 (×6): qty 100

## 2017-11-09 MED ORDER — SODIUM CHLORIDE 0.9 % IV SOLN
INTRAVENOUS | Status: DC
  Administered 2017-11-09: 08:00:00 via INTRAVENOUS

## 2017-11-09 MED ORDER — LIP MEDEX EX OINT
TOPICAL_OINTMENT | CUTANEOUS | Status: AC
  Administered 2017-11-09: 17:00:00
  Filled 2017-11-09: qty 7

## 2017-11-09 MED ORDER — SODIUM CHLORIDE 0.9% IV SOLUTION: Freq: Once | INTRAVENOUS | Status: DC

## 2017-11-09 MED ORDER — SODIUM CHLORIDE 0.9 % IV SOLN
INTRAVENOUS | Status: DC
  Administered 2017-11-09 – 2017-11-10 (×2): via INTRAVENOUS

## 2017-11-09 NOTE — Plan of Care (Signed)
  Problem: Pain Managment: Goal: General experience of comfort will improve Outcome: Progressing   

## 2017-11-09 NOTE — Progress Notes (Signed)
CRITICAL VALUE ALERT  Critical Value:  Platelets 9, LA 2.0, WBC 0.9  Date & Time Notied:  0915  Provider Notified: Dr Grandville Silos at bedside  Orders Received/Actions taken: see orders

## 2017-11-09 NOTE — Progress Notes (Signed)
IP PROGRESS NOTE  Subjective:   This is a 72 year old with extensive stage small cell lung cancer diagnosed in June 2019.  He received the first cycle of carboplatin and etoposide while he was hospitalized followed by Granix growth factor support.  He received the second cycle of chemotherapy on October 29, 2017 and received growth factor support following that cycle.  He presented on 11/08/2017 with complaints of fatigue and fever and found to be neutropenic with the total white cell count of 0.4 and a hemoglobin of 7 with platelet count of 5.  He was hospitalized urgently overnight.  He was started on broad-spectrum antibiotics and was transfused 2 units of packed red cells and 1 unit of platelets.  Clinically, he feels improved at this morning.  He denies any fevers or chills.  He denies any bleeding episodes.  He denies any hemoptysis or hematemesis.  He appears hemodynamically stable.  He does not report any headaches, blurry vision, syncope or seizures. Does not report any fevers, chills or sweats.  Does not report any cough, wheezing or hemoptysis.  Does not report any chest pain, palpitation, orthopnea or leg edema.  Does not report any nausea, vomiting or abdominal pain.  Does not report any constipation or diarrhea.   Does not report frequency, urgency or hematuria.  Does not report any lymphadenopathy or petechiae. Remaining review of systems is negative.    Objective:  Vital signs in last 24 hours: Temp:  [97.5 F (36.4 C)-100.5 F (38.1 C)] 97.9 F (36.6 C) (07/27 0705) Pulse Rate:  [83-116] 96 (07/27 0747) Resp:  [20-24] 23 (07/27 0747) BP: (85-105)/(45-62) 94/56 (07/27 0747) SpO2:  [86 %-97 %] 97 % (07/27 0747) Weight:  [267 lb (121.1 kg)] 267 lb (121.1 kg) (07/26 1712) Weight change:  Last BM Date: 11/07/17  Intake/Output from previous day: 07/26 0701 - 07/27 0700 In: 4537.7 [I.V.:360; Blood:816; IV Piggyback:3361.7] Out: 250 [Urine:250] General: Alert, awake without  distress. Head: Normocephalic atraumatic. Mouth: mucous membranes appeared dry without ulcers. Eyes: No scleral icterus.  Pupils are equal and round reactive to light. Resp: clear to auscultation bilaterally without rhonchi or wheezes or dullness to percussion. Cardio: regular rate and rhythm, S1, S2 normal, no murmur, click, rub or gallop GI: soft, non-tender; bowel sounds normal; no masses,  no organomegaly Musculoskeletal: No joint deformity or effusion. Neurological: No motor, sensory deficits.  Intact deep tendon reflexes. Skin: No rashes or lesions.    Lab Results: Recent Labs    11/08/17 1750 11/09/17 0843  WBC 0.4* 0.9*  HGB 6.6* 7.8*  HCT 19.0* 22.5*  PLT 6* 9*    BMET Recent Labs    11/08/17 1750 11/09/17 0843  NA 135 137  K 4.9 4.4  CL 102 105  CO2 23 23  GLUCOSE 216* 152*  BUN 23 29*  CREATININE 1.32* 1.16  CALCIUM 8.2* 8.0*    Studies/Results: Dg Chest 2 View  Result Date: 11/08/2017 CLINICAL DATA:  Weakness and jaundice.  History of lung cancer. EXAM: CHEST - 2 VIEW COMPARISON:  October 04, 2017 FINDINGS: The heart size and mediastinal contours are within normal limits. Both lungs are clear. The visualized skeletal structures are unremarkable. IMPRESSION: No active cardiopulmonary disease. Electronically Signed   By: Dorise Bullion III M.D   On: 11/08/2017 18:55   Ct Abdomen Pelvis W Contrast  Result Date: 11/08/2017 CLINICAL DATA:  72 year old male with new onset jaundice, fatigue and weight loss. Patient with known metastatic small cell lung cancer, undergoing chemotherapy.  EXAM: CT ABDOMEN AND PELVIS WITH CONTRAST TECHNIQUE: Multidetector CT imaging of the abdomen and pelvis was performed using the standard protocol following bolus administration of intravenous contrast. CONTRAST:  162mL ISOVUE-300 IOPAMIDOL (ISOVUE-300) INJECTION 61% COMPARISON:  10/21/2017 head CT, 09/23/2017 abdominal CT and prior studies FINDINGS: Lower chest: Mild bibasilar  atelectasis noted. Hepatobiliary: Multiple hepatic lesions are again noted do not appear significantly changed from prior studies. The largest lesion measures 3 cm (series 3: Image 31). No biliary dilatation. Gallbladder is unremarkable. There is no evidence of biliary dilatation. Pancreas: Unremarkable Spleen: Unremarkable Adrenals/Urinary Tract: The kidneys, adrenal glands and bladder are unremarkable except for a LEFT renal cyst. Stomach/Bowel: Apparent circumferential wall thickening of small bowel loops in the LEFT abdomen noted with mild adjacent inflammation. Probable associated areas of fluid within the LEFT abdomen noted, some adjacent to small bowel loops. There is no evidence of bowel obstruction or pneumoperitoneum. Stomach is unremarkable. Vascular/Lymphatic: Aortic atherosclerosis. No enlarged abdominal or pelvic lymph nodes. The visualized mesenteric arteries are patent. Reproductive: Prostate is unremarkable. Other: A small amount of free fluid within the pelvis is noted. Musculoskeletal: No discrete focal bony lesions identified. IMPRESSION: . 1. Apparent circumferential wall thickening of some small bowel loops within the LEFT abdomen with mild adjacent inflammation and small amount of adjacent fluid/pelvic ascites, likely representing enteritis with reactive fluid. No evidence of bowel obstruction or pneumoperitoneum. 2. Hepatic metastases again identified.  No biliary dilatation. 3.  Aortic Atherosclerosis (ICD10-I70.0). Electronically Signed   By: Margarette Canada M.D.   On: 11/08/2017 19:06    Medications: I have reviewed the patient's current medications.  Assessment/Plan:  72 year old man with the following:  1.  Extensive stage small cell lung cancer diagnosed in June 2019.  He has a pulmonary and hepatic involvement that appears to have improved after 1 cycle of chemotherapy given in June 2019.  He received cycle 2 with carboplatin and etoposide on October 29, 2017 followed by growth  factor support.  He appears to be responding to systemic chemotherapy as expected and cycle 3 will be given in the future once this episode has resolved.  Further dose reductions may be needed at the discretion of [name].  2.  Neutropenic fever: He is currently receiving broad-spectrum antibiotics and cultures are pending.  No clear evidence or source of infection.  CT scan of the abdomen and pelvis was reviewed and showed possible of enteritis but no abscesses.  I recommend continuing broad-spectrum antibiotics for now until his neutrophil count recovers and cultures remain negative for the next 48 hours.  I do not recommend any further growth factor support in the form of Granix at this time.  3.  Thrombocytopenia: Related to chemotherapy: I have recommended repeating platelet transfusion to keep his platelet above 10,000.  Unless active bleeding is noted, than the threshold will be 50,000.  4.  Anemia: He received 2 units of packed red cells with IMA globin after seven-point I would hold off any packed red cell transfusion unless his hemoglobin drops further below 7 or he is symptomatic.  5.  Disposition: He is not ready for discharge at this time but anticipate reasonable recovery time in the next 48 to 72 hours.  25  minutes was spent with the patient face-to-face today.  More than 50% of time was dedicated to patient counseling, education and reviewing his medical records including imaging studies and discussing future plan of care for the next few days.     LOS: 1 day  Zola Button 11/09/2017, 10:44 AM

## 2017-11-09 NOTE — Progress Notes (Addendum)
PROGRESS NOTE    Zhamir Pirro  HUT:[id number] DOB: 01-Dec-1945 DOA: 11/08/2017 PCP: [name], MD    Brief Narrative:  Robert Compton is a 72 y.o. male with medical history significant of SCLC with mets to liver causing jaundice.  Chemo with carboplatin and etoposide last week.  Increasing fatigue, jaundice, dizziness for past couple of days, decreased PO intake.  Sent in from cancer center for febrile neutropenia.   ED Course: Tm 100.5, initial BP 91 systolic improved to 656 systolic after IVF.  HR 116 now down to 99.  Pancytopenia with WBC 0.4, HGB 6.6, platelets of 6.  Trop 0.10.  Lactate of 3.3 initially, improved 2.5 after IVF.  Creat 1.3 up from 0.88.  Bili 11.6.  CT abd/pelvis suggests enteritis.  Patient pancultured and placed empirically on IV vancomycin and IV aztreonam.       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  Patient noted on admission to be in severe sepsis with hypotension, lactic acidosis, pancytopenia with a ANC of 0.0.  Chest x-ray done on admission was negative for any acute cardiopulmonary disease.  Blood cultures were ordered at the cancer center and currently pending.  Urinalysis nitrite negative leukocytes negative.  Patient denies any diarrhea.  Patient currently afebrile.  Lactic acid level trending down.  Patient neutropenic white count of 0.9 from 0.4 on admission.  We will add a differential to CBC obtained this morning.  Continue empiric IV vancomycin and IV  aztreonam.  We will add IV Flagyl for anaerobic coverage.  Follow.  2.  Antineoplastic chemotherapy-induced pancytopenia Patient had presented with a severe pancytopenia with a white count of 0.4 and a ANC of 0.0.  Patient noted to have a hemoglobin drop as low as 6.6 and the platelet count as low as 5.  Patient status post 2 units packed red blood cells hemoglobin currently at 7.8.  Patient received a unit of platelets with platelet count at 9.  Patient noted to have received Neulasta after chemotherapy last week.  WBC currently at 0.9 from 0.4 on admission.  Will hold off on Neupogen and monitor at this time.  Transfuse another unit of platelets.  Place on neutropenic precautions.  Will consult with oncology for further evaluation and management.  3.  Metastatic extensive stage small cell carcinoma of the lung Patient on carboplatin and etoposide with Neulasta support with last treatment 10/29/2017 with Neulasta dosed on 11/02/2017.  Consult with oncology.  4.  Hyperbilirubinemia/jaundice Likely secondary to metastatic disease.  CT abdomen and pelvis which was done on admission stent with hepatic metastases with no biliary dilatation.  Check a hepatic panel this morning.  Supportive care.  5.  Acute kidney injury Likely secondary to a prerenal azotemia.  Improving with IV fluids.  Follow.  6.  Diabetes mellitus type 2 Hemoglobin A1c was 7.2 on 09/28/2017.  CBG this morning is 112.  Continue to hold oral hypoglycemic agents.  Continue sliding scale insulin.  Graft #7 gastroesophageal reflux disease PPI.  7.  Pressure injury  of the skin stage II bilateral inner buttocks Continue current wound care.  8.  Dehydration IV fluids.    DVT prophylaxis: SCDs Code Status: Full Family Communication: Updated patient.  No family at bedside. Disposition Plan: Remain in stepdown unit.   Consultants:   Oncology pending  Procedures:   Chest x-ray 11/08/2017  CT abdomen and pelvis 11/08/2017  2  units packed red blood cells 11/09/2017  1 unit platelets 11/08/2017  1 UNIT Platelets pending 11/09/2017  Antimicrobials:   IV aztreonam 11/08/2017  IV vancomycin 11/08/2017  IV Flagyl 11/09/2017   Subjective: In bed.  States he is feeling stronger today than on admission.  States he tolerated his breakfast this morning.  Denies any bleeding.  No chest pain.  No shortness of breath.  No diarrhea.  Objective: Vitals:   11/09/17 0555 11/09/17 0700 11/09/17 0705 11/09/17 0747  BP:    (!) 94/56  Pulse:  88  96  Resp:  (!) 21  (!) 23  Temp: 97.7 F (36.5 C)  97.9 F (36.6 C)   TempSrc: Oral  Oral   SpO2:  97%  97%  Weight:      Height:        Intake/Output Summary (Last 24 hours) at 11/09/2017 1016 Last data filed at 11/09/2017 1000 Gross per 24 hour  Intake 5201 ml  Output 400 ml  Net 4801 ml   Filed Weights   11/08/17 1712  Weight: 121.1 kg (267 lb)    Examination:  General exam: Jaundiced.  Scleral icterus. Respiratory system: Clear to auscultation. Respiratory effort normal. Cardiovascular system: S1 & S2 heard, RRR. No JVD, murmurs, rubs, gallops or clicks.  1+ bilateral lower extremity edema. Gastrointestinal system: Abdomen is nondistended, soft and nontender. No organomegaly or masses felt. Normal bowel sounds heard. Central nervous system: Alert and oriented. No focal neurological deficits. Extremities: 1+ bilateral lower extremity edema Skin: No rashes, lesions or ulcers Psychiatry: Judgement and insight appear normal. Mood & affect appropriate.     Data Reviewed: I have personally reviewed following labs and imaging studies  CBC: Recent Labs  Lab 11/04/17 1331 11/08/17 1506 11/08/17 1750 11/09/17 0843  WBC 4.9 0.4* 0.4* 0.9*  NEUTROABS 4.1 0.0*  --  PENDING  HGB 9.6* 7.0* 6.6* 7.8*  HCT 28.3* 20.3* 19.0* 22.5*  MCV 95.6 96.7 95.5 92.2  PLT 63* 5* 6* 9*   Basic Metabolic Panel: Recent Labs  Lab 11/04/17 1331 11/08/17 1506 11/08/17 1750  11/09/17 0843  NA 136 133* 135 137  K 4.9 4.5 4.9 4.4  CL 103 102 102 105  CO2 24 18* 23 23  GLUCOSE 216* 185* 216* 152*  BUN 19 20 23  29*  CREATININE 0.88 1.27* 1.32* 1.16  CALCIUM 8.5* 8.2* 8.2* 8.0*  MG  --   --   --  1.7   GFR: Estimated Creatinine Clearance: 83.1 mL/min (by C-G formula based on SCr of 1.16 mg/dL). Liver Function Tests: Recent Labs  Lab 11/04/17 1331 11/08/17 1506 11/08/17 1750 11/09/17 0843  AST 53* 24 32 23  ALT 48* 33 32 29  ALKPHOS 201* 124 96 91  BILITOT 9.2* 11.9* 11.6* 9.6*  PROT 6.1* 5.7* 5.8* 5.6*  ALBUMIN 2.3* 2.0* 2.0* 2.0*   Recent Labs  Lab 11/08/17 1750  LIPASE 22   Recent Labs  Lab 11/08/17 1750  AMMONIA 25   Coagulation Profile: Recent Labs  Lab 11/08/17 1750  INR 1.39   Cardiac Enzymes: Recent Labs  Lab 11/08/17 1750  TROPONINI 0.10*   BNP (last 3 results) No results for input(s): PROBNP in the last 8760 hours. HbA1C: No results for input(s): HGBA1C in the last 72 hours. CBG: Recent Labs  Lab 11/08/17 2146 11/09/17 0723  GLUCAP 145* 112*   Lipid Profile: No results for input(s): CHOL, HDL, LDLCALC, TRIG, CHOLHDL, LDLDIRECT in the last 72 hours. Thyroid Function Tests: No results for input(s): TSH, T4TOTAL, FREET4, T3FREE, THYROIDAB in the last 72 hours. Anemia Panel: No results for input(s): VITAMINB12, FOLATE, FERRITIN, TIBC, IRON, RETICCTPCT in the last 72 hours. Sepsis Labs: Recent Labs  Lab 11/08/17 1756 11/08/17 2013 11/09/17 0843  LATICACIDVEN 3.32* 2.50* 2.0*    Recent Results (from the past 240 hour(s))  MRSA PCR Screening     Status: None   Collection Time: 11/08/17 10:13 PM  Result Value Ref Range Status   MRSA by PCR NEGATIVE NEGATIVE Final    Comment:        The GeneXpert MRSA Assay (FDA approved for NASAL specimens only), is one component of a comprehensive MRSA colonization surveillance program. It is not intended to diagnose MRSA infection nor to guide or monitor treatment  for MRSA infections. Performed at [facility], [place] [address], [place], [place] [address]          Radiology Studies: Dg Chest 2 View  Result Date: 11/08/2017 CLINICAL DATA:  Weakness and jaundice.  History of lung cancer. EXAM: CHEST - 2 VIEW COMPARISON:  October 04, 2017 FINDINGS: The heart size and mediastinal contours are within normal limits. Both lungs are clear. The visualized skeletal structures are unremarkable. IMPRESSION: No active cardiopulmonary disease. Electronically Signed   By: Dorise Bullion III M.D   On: 11/08/2017 18:55   Ct Abdomen Pelvis W Contrast  Result Date: 11/08/2017 CLINICAL DATA:  72 year old male with new onset jaundice, fatigue and weight loss. Patient with known metastatic small cell lung cancer, undergoing chemotherapy. EXAM: CT ABDOMEN AND PELVIS WITH CONTRAST TECHNIQUE: Multidetector CT imaging of the abdomen and pelvis was performed using the standard protocol following bolus administration of intravenous contrast. CONTRAST:  122mL ISOVUE-300 IOPAMIDOL (ISOVUE-300) INJECTION 61% COMPARISON:  10/21/2017 head CT, 09/23/2017 abdominal CT and prior studies FINDINGS: Lower chest: Mild bibasilar atelectasis noted. Hepatobiliary: Multiple hepatic lesions are again noted do not appear significantly changed from prior studies. The largest lesion measures 3 cm (series 3: Image 31). No biliary dilatation. Gallbladder is unremarkable. There is no evidence of biliary dilatation. Pancreas: Unremarkable Spleen: Unremarkable Adrenals/Urinary Tract: The kidneys, adrenal glands and bladder are unremarkable except for a LEFT renal cyst. Stomach/Bowel: Apparent circumferential wall thickening of small bowel loops in the LEFT abdomen noted with mild adjacent inflammation. Probable associated areas of fluid within the LEFT abdomen noted, some adjacent to small bowel loops. There is no evidence of bowel obstruction or pneumoperitoneum. Stomach is unremarkable.  Vascular/Lymphatic: Aortic atherosclerosis. No enlarged abdominal or pelvic lymph nodes. The visualized mesenteric arteries are patent. Reproductive: Prostate is unremarkable. Other: A small amount of free fluid within the pelvis is noted. Musculoskeletal: No discrete focal bony lesions identified. IMPRESSION: . 1. Apparent circumferential wall thickening of some small bowel loops within the LEFT abdomen with mild adjacent inflammation and small amount of adjacent fluid/pelvic ascites, likely representing enteritis with reactive fluid. No evidence of bowel obstruction or pneumoperitoneum. 2. Hepatic metastases again identified.  No biliary dilatation. 3.  Aortic Atherosclerosis (ICD10-I70.0). Electronically Signed   By: Margarette Canada M.D.   On: 11/08/2017 19:06  Scheduled Meds: . insulin aspart  0-15 Units Subcutaneous TID WC  . methocarbamol  500 mg Oral TID  . nicotine  21 mg Transdermal Daily  . omega-3 acid ethyl esters  1 g Oral BID  . pantoprazole  40 mg Oral Daily   Continuous Infusions: . sodium chloride 100 mL/hr at 11/09/17 0958  . aztreonam Stopped (11/09/17 2010)  . magnesium sulfate 1 - 4 g bolus IVPB 4 g (11/09/17 0958)  . sodium chloride 0.9 % 1,000 mL infusion    . vancomycin       LOS: 1 day    Time spent: 40 mins    Irine Seal, MD [facility] Pager [phone]  If 7PM-7AM, please contact night-coverage [email or web address] Password [facility] 11/09/2017, 10:16 AM

## 2017-11-10 LAB — URINE CULTURE: Culture: NO GROWTH

## 2017-11-10 LAB — GLUCOSE, CAPILLARY
Glucose-Capillary: 119 mg/dL — ABNORMAL HIGH (ref 70–99)
Glucose-Capillary: 200 mg/dL — ABNORMAL HIGH (ref 70–99)
Glucose-Capillary: 105 mg/dL — ABNORMAL HIGH (ref 70–99)

## 2017-11-10 LAB — COMPREHENSIVE METABOLIC PANEL
ALK PHOS: 87 U/L (ref 38–126)
ALT: 28 U/L (ref 0–44)
ANION GAP: 7 (ref 5–15)
AST: 30 U/L (ref 15–41)
Albumin: 1.9 g/dL — ABNORMAL LOW (ref 3.5–5.0)
BILIRUBIN TOTAL: 7.2 mg/dL — AB (ref 0.3–1.2)
BUN: 21 mg/dL (ref 8–23)
CALCIUM: 7.9 mg/dL — AB (ref 8.9–10.3)
CO2: 24 mmol/L (ref 22–32)
CREATININE: 0.8 mg/dL (ref 0.61–1.24)
Chloride: 107 mmol/L (ref 98–111)
GFR calc non Af Amer: >60 mL/min (ref >60)
GLUCOSE: 133 mg/dL — AB (ref 70–99)
Potassium: 3.9 mmol/L (ref 3.5–5.1)
Sodium: 138 mmol/L (ref 135–145)
TOTAL PROTEIN: 5.4 g/dL — AB (ref 6.5–8.1)

## 2017-11-10 LAB — LACTIC ACID, PLASMA: Lactic Acid, Venous: 1.4 mmol/L (ref 0.5–1.9)

## 2017-11-10 LAB — CBC WITH DIFFERENTIAL/PLATELET
Basophils Absolute: 0 10*3/uL (ref 0.0–0.1)
Basophils Relative: 1 %
EOS PCT: 2 %
Eosinophils Absolute: 0 10*3/uL (ref 0.0–0.7)
HCT: 22.1 % — ABNORMAL LOW (ref 39.0–52.0)
HEMOGLOBIN: 7.8 g/dL — AB (ref 13.0–17.0)
LYMPHS ABS: 0.6 10*3/uL — AB (ref 0.7–4.0)
LYMPHS PCT: 34 %
MCH: 32.5 pg (ref 26.0–34.0)
MCHC: 35.3 g/dL (ref 30.0–36.0)
MCV: 92.1 fL (ref 78.0–100.0)
MONOS PCT: 14 %
Monocytes Absolute: 0.3 10*3/uL (ref 0.1–1.0)
NEUTROS PCT: 49 %
Neutro Abs: 0.9 10*3/uL — ABNORMAL LOW (ref 1.7–7.7)
PLATELETS: 22 10*3/uL — AB (ref 150–400)
RBC: 2.4 MIL/uL — AB (ref 4.22–5.81)
RDW: 18.5 % — ABNORMAL HIGH (ref 11.5–15.5)
WBC: 1.8 10*3/uL — ABNORMAL LOW (ref 4.0–10.5)

## 2017-11-10 LAB — MAGNESIUM: Magnesium: 2 mg/dL (ref 1.7–2.4)

## 2017-11-10 MED ORDER — SODIUM CHLORIDE 0.9 % IV SOLN
INTRAVENOUS | Status: DC
  Filled 2017-11-10 (×2): qty 1000

## 2017-11-10 MED ORDER — ENSURE ENLIVE PO LIQD
237.0000 mL | Freq: Two times a day (BID) | ORAL | Status: DC
  Administered 2017-11-11 – 2017-11-12 (×3): 237 mL via ORAL

## 2017-11-10 MED ORDER — TRAMADOL HCL 50 MG PO TABS
50.0000 mg | ORAL_TABLET | ORAL | Status: DC | PRN
  Administered 2017-11-10 – 2017-11-12 (×5): 50 mg via ORAL
  Filled 2017-11-10 (×5): qty 1

## 2017-11-10 MED ORDER — LORATADINE 10 MG PO TABS
10.0000 mg | ORAL_TABLET | Freq: Every day | ORAL | Status: DC
  Administered 2017-11-11 – 2017-11-12 (×2): 10 mg via ORAL
  Filled 2017-11-10 (×2): qty 1

## 2017-11-10 MED ORDER — HYDRALAZINE HCL 20 MG/ML IJ SOLN: 5.0000 mg | Freq: Four times a day (QID) | INTRAMUSCULAR | Status: DC | PRN

## 2017-11-10 MED ORDER — FLUTICASONE PROPIONATE 50 MCG/ACT NA SUSP
2.0000 | Freq: Every day | NASAL | Status: DC
  Administered 2017-11-11 – 2017-11-12 (×2): 2 via NASAL
  Filled 2017-11-10: qty 16

## 2017-11-10 MED ORDER — ALUM & MAG HYDROXIDE-SIMETH 200-200-20 MG/5ML PO SUSP
15.0000 mL | ORAL | Status: DC | PRN
  Administered 2017-11-10: 15 mL via ORAL
  Filled 2017-11-10: qty 30

## 2017-11-10 MED ORDER — BENZONATATE 100 MG PO CAPS
200.0000 mg | ORAL_CAPSULE | Freq: Three times a day (TID) | ORAL | Status: DC
  Administered 2017-11-10 – 2017-11-12 (×5): 200 mg via ORAL
  Filled 2017-11-10 (×5): qty 2

## 2017-11-10 MED ORDER — VANCOMYCIN HCL IN DEXTROSE 1-5 GM/200ML-% IV SOLN
1000.0000 mg | Freq: Two times a day (BID) | INTRAVENOUS | Status: DC
  Administered 2017-11-10 – 2017-11-11 (×2): 1000 mg via INTRAVENOUS
  Filled 2017-11-10 (×2): qty 200

## 2017-11-10 NOTE — Progress Notes (Signed)
Called report to [place], pt transferred in bed.

## 2017-11-10 NOTE — Care Management Note (Signed)
Case Management Note  Patient Details  Name: Robert Compton MRN: [id number] Date of Birth: [birth year]  Subjective/Objective:    Severe Sepsis,                 Action/Plan: NCM spoke to pt and HH active with [facility]. Gave permission to contact dtr, Robert Compton. Pt states he has RW at home. Pt active HHRN, OT and PT. Will need resumption of HH orders. Pt is requesting a hospital bed with gel overlay mattress.    Expected Discharge Date:            Expected Discharge Plan:  [place]  In-House Referral:  NA  Discharge planning Services  CM Consult  Post Acute Care Choice:  Home Health, Resumption of Svcs/PTA Provider Choice offered to:  Patient  DME Arranged:  N/A DME Agency:  NA  HH Arranged:  RN, PT, OT, Nurse's Aide [place] Agency:  [place]  Status of Service:  In process, will continue to follow  If discussed at Long Length of Stay Meetings, dates discussed:    Additional Comments:  Erenest Rasher, RN 11/10/2017, 5:22 PM

## 2017-11-10 NOTE — Progress Notes (Signed)
PROGRESS NOTE    Gaberiel Youngblood  NAT:[id number] DOB: June 15, 1945 DOA: 11/08/2017 PCP: [name], MD    Brief Narrative:  Robert Compton is a 72 y.o. male with medical history significant of SCLC with mets to liver causing jaundice.  Chemo with carboplatin and etoposide last week.  Increasing fatigue, jaundice, dizziness for past couple of days, decreased PO intake.  Sent in from cancer center for febrile neutropenia.   ED Course: Tm 100.5, initial BP 91 systolic improved to 427 systolic after IVF.  HR 116 now down to 99.  Pancytopenia with WBC 0.4, HGB 6.6, platelets of 6.  Trop 0.10.  Lactate of 3.3 initially, improved 2.5 after IVF.  Creat 1.3 up from 0.88.  Bili 11.6.  CT abd/pelvis suggests enteritis.  Patient pancultured and placed empirically on IV vancomycin and IV aztreonam.       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  Patient noted on admission to be in severe sepsis with hypotension, lactic acidosis, pancytopenia with a ANC of 0.0.  Chest x-ray done on admission was negative for any acute cardiopulmonary disease.  Blood cultures were ordered at the cancer center and currently pending.  Urinalysis nitrite negative leukocytes negative.  Patient denies any diarrhea.  Patient currently afebrile.  Lactic acid level trending down.  Patient neutropenic white count of 1.8 from 0.9 from 0.4 on admission.  Blood cultures pending.  Continue empiric IV vancomycin, IV aztreonam, IV Flagyl.  If blood  cultures remain negative tomorrow and patient still afebrile we will discontinue IV vancomycin. Follow.  2.  Antineoplastic chemotherapy-induced pancytopenia Patient had presented with a severe pancytopenia with a white count of 0.4 and a ANC of 0.0.  Patient noted to have a hemoglobin drop as low as 6.6 and the platelet count as low as 5.  Patient status post 2 units packed red blood cells hemoglobin currently at 7.8.  Patient status post 2 units of platelets with platelet count now at 22.  Patient noted to have received Neulasta after chemotherapy last week.  WBC currently at 1.8 from 0.9 from 0.4 on admission.  Continue neutropenic precautions.  Oncology following and I appreciate the input and recommendations.   3.  Metastatic extensive stage small cell carcinoma of the lung Patient on carboplatin and etoposide with Neulasta support with last treatment 10/29/2017 with Neulasta dosed on 11/02/2017.  Oncology following.  4.  Hyperbilirubinemia/jaundice Likely secondary to metastatic disease.  CT abdomen and pelvis which was done on admission with hepatic metastases with no biliary dilatation.  Bilirubin levels trending down. Supportive care.  5.  Acute kidney injury Likely secondary to a prerenal azotemia.  Improved with hydration.   6.  Diabetes mellitus type 2 Hemoglobin A1c was 7.2 on 09/28/2017.  CBG this morning is 119.  Continue to hold oral hypoglycemic agents.  Continue sliding scale insulin.    7 gastroesophageal reflux disease PPI.  8.  Pressure injury of the skin stage II bilateral inner buttocks Continue current wound care.  9.  Dehydration Saline  lock IV fluids.  10.  Tobacco abuse Tobacco cessation.  Continue nicotine patch.    DVT prophylaxis: SCDs Code Status: Full Family Communication: Updated patient.  No family at bedside. Disposition Plan: Likely back home once medically stable, clinical improvement, resolution of febrile neutropenia and stabilization of counts.      Consultants:   Oncology: Dr. Alen Blew 11/09/2017  Procedures:   Chest x-ray 11/08/2017  CT abdomen and pelvis 11/08/2017  2 units packed red blood cells 11/09/2017  1 unit platelets 11/08/2017  1 UNIT Platelets 11/09/2017  Antimicrobials:   IV aztreonam 11/08/2017  IV vancomycin 11/08/2017  IV Flagyl 11/09/2017   Subjective: Patient states feels better this morning.  Eating breakfast.  Denies any bleeding, no chest pain, no shortness of breath, no diarrhea.   Objective: Vitals:   11/10/17 0400 11/10/17 0500 11/10/17 0600 11/10/17 0800  BP: (!) 119/51 (!) 119/54  126/65  Pulse: (!) 101 100 (!) 101 96  Resp: 18 19 17 20   Temp: 99 F (37.2 C)   97.8 F (36.6 C)  TempSrc: Oral   Oral  SpO2: 93% 96% 94% 97%  Weight:      Height:        Intake/Output Summary (Last 24 hours) at 11/10/2017 0852 Last data filed at 11/10/2017 0800 Gross per 24 hour  Intake 4240.82 ml  Output 2225 ml  Net 2015.82 ml   Filed Weights   11/08/17 1712  Weight: 121.1 kg (267 lb)    Examination:  General exam: Less jaundiced.  Scleral icterus. Respiratory system: Some minimal coarse breath sounds in the bases.  No crackles.  No wheezing.  Normal respiratory effort.  Cardiovascular system: Regular rate and rhythm no murmurs rubs or gallops.  No JVD.  No lower extremity edema.   Gastrointestinal system: Abdomen is soft, nontender, nondistended, positive bowel sounds.  No hepatosplenomegaly.  Central nervous system: Alert and oriented. No focal neurological deficits. Extremities: 5 out of 5 bilateral upper extremity strength.  5 out of 5 bilateral lower extremity strength.  Skin: No rashes, lesions or ulcers Psychiatry: Judgement and insight appear normal. Mood & affect appropriate.     Data Reviewed: I have personally reviewed following labs and imaging studies  CBC: Recent Labs  Lab 11/04/17 1331 11/08/17 1506 11/08/17 1750 11/09/17 0843 11/09/17 1554 11/10/17 0312  WBC 4.9 0.4*  0.4* 0.9* 1.1* 1.8*  NEUTROABS 4.1 0.0*  --  0.1* 0.2* 0.9*  HGB 9.6* 7.0* 6.6* 7.8* 7.3* 7.8*  HCT 28.3* 20.3* 19.0* 22.5* 21.0* 22.1*  MCV 95.6 96.7 95.5 92.2 89.7 92.1  PLT 63* 5* 6* 9* 23* 22*   Basic Metabolic Panel: Recent Labs  Lab 11/04/17 1331 11/08/17 1506 11/08/17 1750 11/09/17 0843 11/10/17 0312  NA 136 133* 135 137 138  K 4.9 4.5 4.9 4.4 3.9  CL 103 102 102 105 107  CO2 24 18* 23 23 24   GLUCOSE 216* 185* 216* 152* 133*  BUN 19 20 23  29* 21  CREATININE 0.88 1.27* 1.32* 1.16 0.80  CALCIUM 8.5* 8.2* 8.2* 8.0* 7.9*  MG  --   --   --  1.7 2.0   GFR: Estimated Creatinine Clearance: 120.4 mL/min (by C-G formula based on SCr of 0.8 mg/dL). Liver Function Tests: Recent Labs  Lab 11/04/17 1331 11/08/17 1506 11/08/17 1750 11/09/17 0843 11/10/17 0312  AST 53* 24 32 23 30  ALT 48* 33 32 29 28  ALKPHOS 201* 124 96 91 87  BILITOT 9.2* 11.9* 11.6* 9.6* 7.2*  PROT 6.1* 5.7* 5.8* 5.6* 5.4*  ALBUMIN 2.3* 2.0* 2.0* 2.0* 1.9*   Recent Labs  Lab 11/08/17 1750  LIPASE 22   Recent Labs  Lab 11/08/17 1750  AMMONIA 25   Coagulation Profile: Recent Labs  Lab 11/08/17 1750  INR 1.39   Cardiac Enzymes: Recent Labs  Lab 11/08/17 1750  TROPONINI 0.10*   BNP (last 3 results) No results for input(s): PROBNP in the last 8760 hours. HbA1C: No results for input(s): HGBA1C in the last 72 hours. CBG: Recent Labs  Lab 11/09/17 0723 11/09/17 1209 11/09/17 1630 11/09/17 2126 11/10/17 0740  GLUCAP 112* 121* 110* 132* 119*   Lipid Profile: No results for input(s): CHOL, HDL, LDLCALC, TRIG, CHOLHDL, LDLDIRECT in the last 72 hours. Thyroid Function Tests: No results for input(s): TSH, T4TOTAL, FREET4, T3FREE, THYROIDAB in the last 72 hours. Anemia Panel: No results for input(s): VITAMINB12, FOLATE, FERRITIN, TIBC, IRON, RETICCTPCT in the last 72 hours. Sepsis Labs: Recent Labs  Lab 11/08/17 1756 11/08/17 2013 11/09/17 0843 11/10/17 0312  LATICACIDVEN 3.32*  2.50* 2.0* 1.4    Recent Results (from the past 240 hour(s))  Culture, Blood     Status: None (Preliminary result)   Collection Time: 11/08/17  4:23 PM  Result Value Ref Range Status   Specimen Description   Final    BLOOD RIGHT ARM Performed at [facility], [place] [address], [place], [place] [address]    Special Requests   Final    NONE Performed at [facility], [place] [address], [place], [place] [address]    Culture   Final    NO GROWTH < 24 HOURS Performed at [facility] [address] [place], [place] [address]    Report Status PENDING  Incomplete  Culture, Blood     Status: None (Preliminary result)   Collection Time: 11/08/17  4:33 PM  Result Value Ref Range Status   Specimen Description   Final    BLOOD LEFT ARM Performed at [facility], [place] [address] [place], [place] [address]    Special Requests   Final    NONE Performed at [facility], [place] [address], [place], [place] [address]    Culture   Final    NO GROWTH < 24 HOURS Performed at [place] [address] [place] [address], [place], [place] [address]    Report Status PENDING  Incomplete  MRSA PCR Screening     Status: None   Collection Time: 11/08/17 10:13 PM  Result Value Ref Range Status   MRSA by PCR NEGATIVE NEGATIVE Final    Comment:        The GeneXpert MRSA Assay (FDA approved for NASAL specimens only), is one component of a comprehensive MRSA colonization surveillance program. It is not intended to diagnose MRSA infection nor to guide or monitor treatment for MRSA infections. Performed at [facility], [place] [address], [place], [place] [address]   Urine culture     Status: None   Collection Time: 11/09/17  4:47 AM  Result Value Ref Range Status   Specimen Description   Final    URINE, CLEAN CATCH Performed at [facility], [place] [address],  [place], [place] [address]    Special Requests   Final    NONE Performed at [facility], [place] [address] [place], [place] [address]    Culture   Final  NO GROWTH Performed at [facility], [place] [address], [place], [place] [address]    Report Status 11/10/2017 FINAL  Final         Radiology Studies: Dg Chest 2 View  Result Date: 11/08/2017 CLINICAL DATA:  Weakness and jaundice.  History of lung cancer. EXAM: CHEST - 2 VIEW COMPARISON:  October 04, 2017 FINDINGS: The heart size and mediastinal contours are within normal limits. Both lungs are clear. The visualized skeletal structures are unremarkable. IMPRESSION: No active cardiopulmonary disease. Electronically Signed   By: Dorise Bullion III M.D   On: 11/08/2017 18:55   Ct Abdomen Pelvis W Contrast  Result Date: 11/08/2017 CLINICAL DATA:  72 year old male with new onset jaundice, fatigue and weight loss. Patient with known metastatic small cell lung cancer, undergoing chemotherapy. EXAM: CT ABDOMEN AND PELVIS WITH CONTRAST TECHNIQUE: Multidetector CT imaging of the abdomen and pelvis was performed using the standard protocol following bolus administration of intravenous contrast. CONTRAST:  186mL ISOVUE-300 IOPAMIDOL (ISOVUE-300) INJECTION 61% COMPARISON:  10/21/2017 head CT, 09/23/2017 abdominal CT and prior studies FINDINGS: Lower chest: Mild bibasilar atelectasis noted. Hepatobiliary: Multiple hepatic lesions are again noted do not appear significantly changed from prior studies. The largest lesion measures 3 cm (series 3: Image 31). No biliary dilatation. Gallbladder is unremarkable. There is no evidence of biliary dilatation. Pancreas: Unremarkable Spleen: Unremarkable Adrenals/Urinary Tract: The kidneys, adrenal glands and bladder are unremarkable except for a LEFT renal cyst. Stomach/Bowel: Apparent circumferential wall thickening of small bowel loops in the LEFT abdomen noted with mild adjacent inflammation.  Probable associated areas of fluid within the LEFT abdomen noted, some adjacent to small bowel loops. There is no evidence of bowel obstruction or pneumoperitoneum. Stomach is unremarkable. Vascular/Lymphatic: Aortic atherosclerosis. No enlarged abdominal or pelvic lymph nodes. The visualized mesenteric arteries are patent. Reproductive: Prostate is unremarkable. Other: A small amount of free fluid within the pelvis is noted. Musculoskeletal: No discrete focal bony lesions identified. IMPRESSION: . 1. Apparent circumferential wall thickening of some small bowel loops within the LEFT abdomen with mild adjacent inflammation and small amount of adjacent fluid/pelvic ascites, likely representing enteritis with reactive fluid. No evidence of bowel obstruction or pneumoperitoneum. 2. Hepatic metastases again identified.  No biliary dilatation. 3.  Aortic Atherosclerosis (ICD10-I70.0). Electronically Signed   By: Margarette Canada M.D.   On: 11/08/2017 19:06        Scheduled Meds: . sodium chloride   Intravenous Once  . insulin aspart  0-15 Units Subcutaneous TID WC  . methocarbamol  500 mg Oral TID  . nicotine  21 mg Transdermal Daily  . omega-3 acid ethyl esters  1 g Oral BID  . pantoprazole  40 mg Oral Daily   Continuous Infusions: . sodium chloride Stopped (11/10/17 0850)  . aztreonam Stopped (11/10/17 5400)  . metronidazole Stopped (11/10/17 0513)  . sodium chloride 0.9 % 1,000 mL infusion    . vancomycin       LOS: 2 days    Time spent: 40 mins    Irine Seal, MD [facility] Pager [phone]  If 7PM-7AM, please contact night-coverage [email or web address] Password [email or web address] 11/10/2017, 8:52 AM

## 2017-11-10 NOTE — Progress Notes (Signed)
Pharmacy Antibiotic Note  Robert Compton is a 72 y.o. male admitted on 11/08/2017 with febrile neutropenia.  Pharmacy has been consulted for vancomycin + aztreonam dosing. Patient has small cell carcinoma of the lung undergoing chemotherapy with carboplatin and etoposide. Patient admitted for febrile neutropenia. Patient has PCN allergy.  Today, 11/10/17  WBC 0.4>>1.8, ANC 0.9  afebrile  SCr 1.3>>0.8, CrCl 120 mL/min  Plan:  Continue Aztreonam 2 g IV q8h  Continue flagyl 500 mg IV q8h for anaerobic coverage  Change vancomycin to 1 gm IV q12 for est AUC 428 (SCr 1, IBW/ABW)  Anticipate abx to stop once his neutropenia resolves and he is culture negative x 48 hrs  Follow renal function, cultures, and vancomycin levels once at steady state  Height: 6\' 3"  (190.5 cm) Weight: 267 lb (121.1 kg) IBW/kg (Calculated) : 84.5  Temp (24hrs), Avg:98.1 F (36.7 C), Min:97.7 F (36.5 C), Max:99.1 F (37.3 C)  Recent Labs  Lab 11/04/17 1331 11/08/17 1506 11/08/17 1750 11/08/17 1756 11/08/17 2013 11/09/17 0843 11/09/17 1554 11/10/17 0312  WBC 4.9 0.4* 0.4*  --   --  0.9* 1.1* 1.8*  CREATININE 0.88 1.27* 1.32*  --   --  1.16  --  0.80  LATICACIDVEN  --   --   --  3.32* 2.50* 2.0*  --  1.4    Estimated Creatinine Clearance: 120.4 mL/min (by C-G formula based on SCr of 0.8 mg/dL).    Allergies  Allergen Reactions  . Amoxicillin-Pot Clavulanate Anaphylaxis  . Nitrofurantoin Anaphylaxis  Antimicrobials this admission:  7/26 aztreonam>> 7/26 lvq x 1 dose 7/26 vanc>> 7/27 flagyl>> Dose adjustments this admission:  7/28 vanc 1750 q24>>vanc 1 gm q12   Microbiology results:  7/27 Ucx>>sent 7/27 BCx2>> ngtd 7/26 MRSA PCR neg  Thank you for allowing pharmacy to be a part of this patient's care. Eudelia Bunch, Pharm.D [phone] 11/10/2017 8:28 AM

## 2017-11-10 NOTE — Progress Notes (Signed)
Initial Nutrition Assessment  DOCUMENTATION CODES:   Obesity unspecified  INTERVENTION:   Provide Ensure Enlive po BID, each supplement provides 350 kcal and 20 grams of protein  NUTRITION DIAGNOSIS:   Increased nutrient needs related to cancer and cancer related treatments, chronic illness as evidenced by estimated needs.  GOAL:   Patient will meet greater than or equal to 90% of their needs  MONITOR:   PO intake, Supplement acceptance, Weight trends, Labs, I & O's, Skin  REASON FOR ASSESSMENT:   Malnutrition Screening Tool    ASSESSMENT:   72 y.o. male with medical history significant of SCLC with mets to liver causing jaundice.  Chemo with carboplatin and etoposide last week.  Increasing fatigue, jaundice, dizziness for past couple of days, decreased PO intake.  Sent in from cancer center for febrile neutropenia  Patient reports poor appetite and decreased intakes associated with chemotherapy. Pt currently consuming 50-100% of meals. Will order Ensure supplements as well given increased needs.  Per chart review, pt has lost 44 lb since 6/7 (14% wt loss x 1.5 months, significant for time frame).   Medications reviewed. Labs reviewed: CBGs: 119-200   NUTRITION - FOCUSED PHYSICAL EXAM:  Nutrition focused physical exam shows no sign of depletion of muscle mass or body fat.  Diet Order:   Diet Order           Diet Carb Modified Fluid consistency: Thin; Room service appropriate? Yes  Diet effective now          EDUCATION NEEDS:   Education needs have been addressed  Skin:  Skin Assessment: Skin Integrity Issues: Skin Integrity Issues:: Stage II Stage II: buttocks  Last BM:  7/28  Height:   Ht Readings from Last 1 Encounters:  11/08/17 6\' 3"  (1.905 m)    Weight:   Wt Readings from Last 1 Encounters:  11/08/17 267 lb (121.1 kg)    Ideal Body Weight:  89.1 kg  BMI:  Body mass index is 33.37 kg/m.  Estimated Nutritional Needs:   Kcal:   2600-2800  Protein:  130-140g  Fluid:  2.5L/day  Clayton Bibles, MS, RD, LDN [address] Dietitian Pager: [phone] After Hours Pager: [phone]

## 2017-11-10 NOTE — Progress Notes (Signed)
IP PROGRESS NOTE  Subjective:   [name] reports feeling reasonably well this morning without any complaints.  He does report some mild headaches at times with sinus congestion but no fevers or chills.  He moved his bowels this morning felt much better.  He denied any bleeding episodes including hematochezia or melena.  He does not report any  blurry vision, syncope or seizures. Does not report any cough, wheezing or hemoptysis.  Does not report any chest pain, palpitation, orthopnea or leg edema.  Does not report any nausea, vomiting or abdominal pain.  Does not report any constipation or diarrhea.   Does not report frequency, urgency or hematuria.  Does not report any lymphadenopathy or petechiae. Remaining review of systems is negative.    Objective:  Vital signs in last 24 hours: Temp:  [97.7 F (36.5 C)-99.1 F (37.3 C)] 97.8 F (36.6 C) (07/28 0800) Pulse Rate:  [89-108] 96 (07/28 0800) Resp:  [16-23] 20 (07/28 0800) BP: (110-128)/(51-65) 126/65 (07/28 0800) SpO2:  [93 %-100 %] 97 % (07/28 0800) Weight change:  Last BM Date: 11/07/17  Intake/Output from previous day: 07/27 0701 - 07/28 0700 In: 4100.8 [P.O.:1260; I.V.:1703.3; Blood:217.5; IV Piggyback:920] Out: 2225 [Urine:2225] General: Comfortable appearing gentleman without distress. Head: Normal cephalic without normalities. Mouth: No thrush or ulcers. Eyes: Sclera anicteric. Resp: clear in all lung fields without wheezes or dullness to percussion. Cardio: regular rate and rhythm, S1, S2 no leg edema. GI: soft, without any rebound or guarding.  No shifting dullness or ascites. Musculoskeletal: No clubbing or cyanosis.. Neurological: No deficits noted on exam. Skin: No ecchymosis or petechiae.    Lab Results: Recent Labs    11/09/17 1554 11/10/17 0312  WBC 1.1* 1.8*  HGB 7.3* 7.8*  HCT 21.0* 22.1*  PLT 23* 22*    BMET Recent Labs    11/09/17 0843 11/10/17 0312  NA 137 138  K 4.4 3.9  CL 105 107   CO2 23 24  GLUCOSE 152* 133*  BUN 29* 21  CREATININE 1.16 0.80  CALCIUM 8.0* 7.9*      Medications: I have reviewed the patient's current medications.  Assessment/Plan:  72 year old man with the:  1.  Extensive stage small cell lung cancer with hepatic and pulmonary involvement diagnosed in June 2019.  He is currently receiving chemotherapy utilizing carboplatin and etoposide before this current admission.  Future treatments will be concluded once he recovers from this episode.   2.  Neutropenic fever: He is hemodynamically stable at this time was the blood culture remained negative.  Will remain on broad-spectrum antibiotics till his [place] recovers and his blood culture remains negative.  No need for any growth factor support at this time.  His white cell count is recovering adequately.  3.  Thrombocytopenia: Platelet count improved after transfusion without any bleeding.  No need for any further transfusion at this time unless active bleeding is noted.  4.  Anemia: Hemoglobin remains stable at this time without any need for transfusion.  His anemia is related to chemotherapy.  5.  Disposition: He does appear to be improving but not quite ready for discharge.  25 minutes was spent with the patient well as floor time.  More than 50% of time was dedicated to patient counseling, education, reviewing laboratory data, medical records and discussing management plan with the patient.     LOS: 2 days   Robert Compton 11/10/2017, 8:56 AM

## 2017-11-11 ENCOUNTER — Inpatient Hospital Stay: Payer: BLUE CROSS/BLUE SHIELD

## 2017-11-11 LAB — CBC WITH DIFFERENTIAL/PLATELET
BASOS PCT: 0 %
Basophils Absolute: 0 10*3/uL (ref 0.0–0.1)
EOS PCT: 1 %
Eosinophils Absolute: 0 10*3/uL (ref 0.0–0.7)
HEMATOCRIT: 22.6 % — AB (ref 39.0–52.0)
Hemoglobin: 8 g/dL — ABNORMAL LOW (ref 13.0–17.0)
LYMPHS ABS: 1.3 10*3/uL (ref 0.7–4.0)
Lymphocytes Relative: 29 %
MCH: 32.4 pg (ref 26.0–34.0)
MCHC: 35.4 g/dL (ref 30.0–36.0)
MCV: 91.5 fL (ref 78.0–100.0)
MONO ABS: 1 10*3/uL (ref 0.1–1.0)
MYELOCYTES: 4 %
Monocytes Relative: 22 %
NEUTROS PCT: 44 %
Neutro Abs: 2.3 10*3/uL (ref 1.7–7.7)
PLATELETS: 30 10*3/uL — AB (ref 150–400)
RBC: 2.47 MIL/uL — ABNORMAL LOW (ref 4.22–5.81)
RDW: 18.3 % — AB (ref 11.5–15.5)
WBC: 4.6 10*3/uL (ref 4.0–10.5)

## 2017-11-11 LAB — COMPREHENSIVE METABOLIC PANEL
ALK PHOS: 91 U/L (ref 38–126)
ALT: 31 U/L (ref 0–44)
AST: 34 U/L (ref 15–41)
Albumin: 1.8 g/dL — ABNORMAL LOW (ref 3.5–5.0)
Anion gap: 6 (ref 5–15)
BUN: 16 mg/dL (ref 8–23)
CALCIUM: 7.7 mg/dL — AB (ref 8.9–10.3)
CHLORIDE: 105 mmol/L (ref 98–111)
CO2: 24 mmol/L (ref 22–32)
CREATININE: 0.81 mg/dL (ref 0.61–1.24)
GFR calc non Af Amer: >60 mL/min (ref >60)
Glucose, Bld: 147 mg/dL — ABNORMAL HIGH (ref 70–99)
Potassium: 3.6 mmol/L (ref 3.5–5.1)
SODIUM: 135 mmol/L (ref 135–145)
Total Bilirubin: 5.4 mg/dL — ABNORMAL HIGH (ref 0.3–1.2)
Total Protein: 5 g/dL — ABNORMAL LOW (ref 6.5–8.1)

## 2017-11-11 LAB — PREPARE PLATELET PHERESIS
Unit division: 0
Unit division: 0

## 2017-11-11 LAB — BPAM PLATELET PHERESIS
Blood Product Expiration Date: 201907262359
Blood Product Expiration Date: 201907282359
ISSUE DATE / TIME: 201907262258
ISSUE DATE / TIME: 201907271230
UNIT TYPE AND RH: 6200
UNIT TYPE AND RH: 6200

## 2017-11-11 LAB — GLUCOSE, CAPILLARY
GLUCOSE-CAPILLARY: 150 mg/dL — AB (ref 70–99)
GLUCOSE-CAPILLARY: 139 mg/dL — AB (ref 70–99)
Glucose-Capillary: 129 mg/dL — ABNORMAL HIGH (ref 70–99)
Glucose-Capillary: 140 mg/dL — ABNORMAL HIGH (ref 70–99)

## 2017-11-11 LAB — BPAM RBC
BLOOD PRODUCT EXPIRATION DATE: 201908172359
Blood Product Expiration Date: 201908132359
ISSUE DATE / TIME: 201907270044
ISSUE DATE / TIME: 201907270316
UNIT TYPE AND RH: 6200
UNIT TYPE AND RH: 6200

## 2017-11-11 LAB — TYPE AND SCREEN
ABO/RH(D): A POS
Antibody Screen: NEGATIVE
UNIT DIVISION: 0
UNIT DIVISION: 0

## 2017-11-11 LAB — MAGNESIUM: Magnesium: 1.8 mg/dL (ref 1.7–2.4)

## 2017-11-11 MED ORDER — LEVOFLOXACIN 750 MG PO TABS
750.0000 mg | ORAL_TABLET | Freq: Every day | ORAL | Status: DC
Start: 2017-11-11 — End: 2017-11-12
  Administered 2017-11-11 – 2017-11-12 (×2): 750 mg via ORAL
  Filled 2017-11-11 (×2): qty 1

## 2017-11-11 MED ORDER — SODIUM CHLORIDE 0.9 % IV SOLN
INTRAVENOUS | Status: DC
  Administered 2017-11-11: 05:00:00 via INTRAVENOUS

## 2017-11-11 MED ORDER — METRONIDAZOLE 500 MG PO TABS: 500.0000 mg | ORAL_TABLET | Freq: Three times a day (TID) | ORAL | Status: DC

## 2017-11-11 NOTE — Progress Notes (Signed)
These preliminary result these preliminary results were noted.  Awaiting final report.

## 2017-11-11 NOTE — Progress Notes (Signed)
PROGRESS NOTE    Robert Compton  OFB:[id number] DOB: 1945-12-10 DOA: 11/08/2017 PCP: [name], MD    Brief Narrative:  Robert Compton is a 72 y.o. male with medical history significant of SCLC with mets to liver causing jaundice.  Chemo with carboplatin and etoposide last week.  Increasing fatigue, jaundice, dizziness for past couple of days, decreased PO intake.  Sent in from cancer center for febrile neutropenia.   ED Course: Tm 100.5, initial BP 91 systolic improved to 782 systolic after IVF.  HR 116 now down to 99.  Pancytopenia with WBC 0.4, HGB 6.6, platelets of 6.  Trop 0.10.  Lactate of 3.3 initially, improved 2.5 after IVF.  Creat 1.3 up from 0.88.  Bili 11.6.  CT abd/pelvis suggests enteritis.  Patient pancultured and placed empirically on IV vancomycin and IV aztreonam.       Assessment & Plan:   Principal Problem:   Severe sepsis ([place]) Active Problems:   Febrile neutropenia ([place])   Metastatic cancer to liver ([place])   DM (diabetes mellitus), type 2 ([place])   Extensive stage primary small cell carcinoma of lung (HCC)   Antineoplastic chemotherapy induced pancytopenia (HCC)   AKI (acute kidney injury) ([place])   Pressure injury of skin   Hyperbilirubinemia   Liver metastasis (HCC)   Neutropenic fever (HCC)   Pancytopenia (HCC)  #1 severe sepsis/febrile neutropenia Questionable etiology.  Patient noted on admission to be in severe sepsis with hypotension, lactic acidosis, pancytopenia with a ANC of 0.0.  Chest x-ray done on admission was negative for any acute cardiopulmonary disease.  Blood cultures were ordered at the cancer center and currently pending.  Urinalysis nitrite negative leukocytes negative.  Patient denies any diarrhea.  Patient currently afebrile.  Lactic acid level trending down.  Patient neutropenic which is improving with white count of 4.6 from 1.8 from 0.9 from 0.4 on admission.  Blood cultures with no growth to date.  Discontinue IV  vancomycin, IV aztreonam, IV Flagyl.  Start patient on oral Levaquin 750 mg daily and treat for 7 days.  Discussed with ID.  Follow.  2.  Antineoplastic chemotherapy-induced pancytopenia Patient had presented with a severe pancytopenia with a white count of 0.4 and a ANC of 0.0.  Patient noted to have a hemoglobin drop as low as 6.6 and the platelet count as low as 5.  Patient status post 2 units packed red blood cells hemoglobin currently at 8.  Patient status post 2 units of platelets with platelet count now at 30.  Patient noted to have received Neulasta after chemotherapy last week.  WBC currently at 4.6 from 1.8 from 0.9 from 0.4 on admission.  Continue neutropenic precautions.  Oncology following and I appreciate the input and recommendations.   3.  Metastatic extensive stage small cell carcinoma of the lung Patient on carboplatin and etoposide with Neulasta support with last treatment 10/29/2017 with Neulasta dosed on 11/02/2017.  Oncology following.  4.  Hyperbilirubinemia/jaundice Likely secondary to metastatic disease.  CT abdomen and pelvis which was done on admission with hepatic metastases with no biliary dilatation.  Bilirubin levels trending down. Supportive care.  5.  Acute kidney injury Likely secondary to a prerenal azotemia.  Improved with hydration.   6.  Diabetes mellitus type 2 Hemoglobin A1c was 7.2 on 09/28/2017.  CBG this morning is 119.  Continue to hold oral hypoglycemic agents.  Continue sliding scale insulin.    7 gastroesophageal reflux disease Continue PPI.  8.  Pressure injury of the  skin stage II bilateral inner buttocks Continue current wound care.  9.  Dehydration Hydrated with IV fluids.  Currently euvolemic.  IV fluids have been saline locked.   10.  Tobacco abuse Tobacco cessation.  Continue nicotine patch.    DVT prophylaxis: SCDs Code Status: Full Family Communication: Updated patient.  No family at bedside. Disposition Plan: Likely back home  once medically stable, clinical improvement, resolution of febrile neutropenia and stabilization of counts.    Consultants:   Oncology: Dr. Alen Blew 11/09/2017  Procedures:   Chest x-ray 11/08/2017  CT abdomen and pelvis 11/08/2017  2 units packed red blood cells 11/09/2017  1 unit platelets 11/08/2017  1 UNIT Platelets 11/09/2017  Antimicrobials:   IV aztreonam 11/08/2017>>>>> 11/11/2017  IV vancomycin 11/08/2017>>>>>>> 11/11/2017  IV Flagyl 11/09/2017>>>>>>> 11/11/2017  Oral Levaquin 11/11/2017   Subjective: Patient sitting up in bed.  Feeling much better.  No chest pain.  No shortness of breath.  Diarrhea improved.  Tolerating oral intake.  Feels much better.   Objective: Vitals:   11/10/17 1600 11/10/17 1707 11/10/17 2058 11/11/17 0457  BP: 114/71 (!) 153/80 117/71 103/63  Pulse: 92 94 92 95  Resp: 19 20 18 18   Temp: 98.1 F (36.7 C) 98.3 F (36.8 C) 98.1 F (36.7 C) 98 F (36.7 C)  TempSrc: Oral Oral Oral Oral  SpO2: 99% 100% 99% 93%  Weight:      Height:        Intake/Output Summary (Last 24 hours) at 11/11/2017 1049 Last data filed at 11/11/2017 0930 Gross per 24 hour  Intake 1581.66 ml  Output 1400 ml  Net 181.66 ml   Filed Weights   11/08/17 1712  Weight: 121.1 kg (267 lb)    Examination:  General exam: Less jaundiced.  Improved Scleral icterus. Respiratory system: Lungs clear to auscultation bilaterally.  No wheezes, no crackles, no rhonchi. Normal respiratory effort.  Cardiovascular system: RRR no murmurs rubs or gallops.  No JVD.  No lower extremity edema.  Gastrointestinal system: Abdomen is nontender, nondistended, soft, positive bowel sounds.  No hepatosplenomegaly.  Central nervous system: Alert and oriented. No focal neurological deficits. Extremities: 5/5 bilateral upper extremity strength.  5/5 bilateral lower extremity strength.  Skin: No rashes, lesions or ulcers Psychiatry: Judgement and insight appear normal. Mood & affect appropriate.      Data Reviewed: I have personally reviewed following labs and imaging studies  CBC: Recent Labs  Lab 11/08/17 1506 11/08/17 1750 11/09/17 0843 11/09/17 1554 11/10/17 0312 11/11/17 0431  WBC 0.4* 0.4* 0.9* 1.1* 1.8* 4.6  NEUTROABS 0.0*  --  0.1* 0.2* 0.9* 2.3  HGB 7.0* 6.6* 7.8* 7.3* 7.8* 8.0*  HCT 20.3* 19.0* 22.5* 21.0* 22.1* 22.6*  MCV 96.7 95.5 92.2 89.7 92.1 91.5  PLT 5* 6* 9* 23* 22* 30*   Basic Metabolic Panel: Recent Labs  Lab 11/08/17 1506 11/08/17 1750 11/09/17 0843 11/10/17 0312 11/11/17 0431  NA 133* 135 137 138 135  K 4.5 4.9 4.4 3.9 3.6  CL 102 102 105 107 105  CO2 18* 23 23 24 24   GLUCOSE 185* 216* 152* 133* 147*  BUN 20 23 29* 21 16  CREATININE 1.27* 1.32* 1.16 0.80 0.81  CALCIUM 8.2* 8.2* 8.0* 7.9* 7.7*  MG  --   --  1.7 2.0 1.8   GFR: Estimated Creatinine Clearance: 118.9 mL/min (by C-G formula based on SCr of 0.81 mg/dL). Liver Function Tests: Recent Labs  Lab 11/08/17 1506 11/08/17 1750 11/09/17 1657 11/10/17 9038 11/11/17 0431  AST 24 32 23 30 34  ALT 33 32 29 28 31   ALKPHOS 124 96 91 87 91  BILITOT 11.9* 11.6* 9.6* 7.2* 5.4*  PROT 5.7* 5.8* 5.6* 5.4* 5.0*  ALBUMIN 2.0* 2.0* 2.0* 1.9* 1.8*   Recent Labs  Lab 11/08/17 1750  LIPASE 22   Recent Labs  Lab 11/08/17 1750  AMMONIA 25   Coagulation Profile: Recent Labs  Lab 11/08/17 1750  INR 1.39   Cardiac Enzymes: Recent Labs  Lab 11/08/17 1750  TROPONINI 0.10*   BNP (last 3 results) No results for input(s): PROBNP in the last 8760 hours. HbA1C: No results for input(s): HGBA1C in the last 72 hours. CBG: Recent Labs  Lab 11/09/17 2126 11/10/17 0740 11/10/17 1206 11/10/17 2101 11/11/17 0736  GLUCAP 132* 119* 200* 105* 129*   Lipid Profile: No results for input(s): CHOL, HDL, LDLCALC, TRIG, CHOLHDL, LDLDIRECT in the last 72 hours. Thyroid Function Tests: No results for input(s): TSH, T4TOTAL, FREET4, T3FREE, THYROIDAB in the last 72 hours. Anemia Panel: No  results for input(s): VITAMINB12, FOLATE, FERRITIN, TIBC, IRON, RETICCTPCT in the last 72 hours. Sepsis Labs: Recent Labs  Lab 11/08/17 1756 11/08/17 2013 11/09/17 0843 11/10/17 0312  LATICACIDVEN 3.32* 2.50* 2.0* 1.4    Recent Results (from the past 240 hour(s))  Culture, Blood     Status: None (Preliminary result)   Collection Time: 11/08/17  4:23 PM  Result Value Ref Range Status   Specimen Description   Final    BLOOD RIGHT ARM Performed at [facility], [place] [address], [place], [place] [address]    Special Requests   Final    BOTTLES DRAWN AEROBIC AND ANAEROBIC Blood Culture adequate volume   Culture   Final    NO GROWTH 2 DAYS Performed at [facility], [place] [address], [place], [place] [address]    Report Status PENDING  Incomplete  Culture, Blood     Status: None (Preliminary result)   Collection Time: 11/08/17  4:33 PM  Result Value Ref Range Status   Specimen Description   Final    BLOOD LEFT ARM Performed at [facility], [place] [address], [place], [place] [address]    Special Requests   Final    BOTTLES DRAWN AEROBIC AND ANAEROBIC Blood Culture adequate volume   Culture   Final    NO GROWTH 2 DAYS Performed at [facility], [place] [address] [place], [place] [address]    Report Status PENDING  Incomplete  MRSA PCR Screening     Status: None   Collection Time: 11/08/17 10:13 PM  Result Value Ref Range Status   MRSA by PCR NEGATIVE NEGATIVE Final    Comment:        The GeneXpert MRSA Assay (FDA approved for NASAL specimens only), is one component of a comprehensive MRSA colonization surveillance program. It is not intended to diagnose MRSA infection nor to guide or monitor treatment for MRSA infections. Performed at [facility], [place] [address], [place], [place] [address]   Urine culture     Status: None   Collection Time: 11/09/17  4:47 AM  Result Value Ref Range Status    Specimen Description   Final    URINE, CLEAN CATCH Performed at [facility], [place] [address] [place], [place] [address]    Special Requests   Final    NONE Performed at [facility], [place] [address] [place],  [place] [address]    Culture   Final    NO GROWTH Performed at [facility], [place] [address], [place], [place] [address]    Report Status 11/10/2017 FINAL  Final         Radiology Studies: No results found.      Scheduled Meds: . benzonatate  200 mg Oral TID  . feeding supplement (ENSURE ENLIVE)  237 mL Oral BID BM  . fluticasone  2 spray Each Nare Daily  . insulin aspart  0-15 Units Subcutaneous TID WC  . loratadine  10 mg Oral Daily  . methocarbamol  500 mg Oral TID  . nicotine  21 mg Transdermal Daily  . omega-3 acid ethyl esters  1 g Oral BID  . pantoprazole  40 mg Oral Daily   Continuous Infusions: . sodium chloride 10 mL/hr at 11/11/17 0515  . aztreonam Stopped (11/11/17 7353)  . metronidazole 500 mg (11/11/17 0514)     LOS: 3 days    Time spent: 40 mins    Irine Seal, MD [facility] Pager [phone]  If 7PM-7AM, please contact night-coverage [email or web address] Password [facility] 11/11/2017, 10:49 AM

## 2017-11-11 NOTE — Progress Notes (Signed)
PT Cancellation Note / Screen  Patient Details Name: Robert Compton MRN: [id number] DOB: [birth year]   Cancelled Treatment:    Reason Eval/Treat Not Completed: PT screened, no needs identified, will sign off Discussed PT with pt.  He adamantly declines to participate in acute setting and declined PT to check back on him.  Pt reports he receives HHPT and plans to f/u with them.  Since pt refuses, PT to sign off.   Brice Kossman,KATHrine E 11/11/2017, 9:34 AM Carmelia Bake, PT, DPT 11/11/2017 Pager: [phone]

## 2017-11-11 NOTE — Progress Notes (Signed)
OT Cancellation Note  Patient Details Name: Robert Compton MRN: [id number] DOB: Nov 08, 1945   Cancelled Treatment:    Reason Eval/Treat Not Completed: Patient declined, no reason specified  Will sign off as pt refuses OT   Kari Baars, [place]  [name] D 11/11/2017, 4:10 PM

## 2017-11-12 DIAGNOSIS — E860 Dehydration: Secondary | ICD-10-CM

## 2017-11-12 LAB — GLUCOSE, CAPILLARY
Glucose-Capillary: 125 mg/dL — ABNORMAL HIGH (ref 70–99)
Glucose-Capillary: 179 mg/dL — ABNORMAL HIGH (ref 70–99)

## 2017-11-12 LAB — BASIC METABOLIC PANEL
Anion gap: 5 (ref 5–15)
BUN: 13 mg/dL (ref 8–23)
CO2: 28 mmol/L (ref 22–32)
CREATININE: 0.7 mg/dL (ref 0.61–1.24)
Calcium: 8 mg/dL — ABNORMAL LOW (ref 8.9–10.3)
Chloride: 106 mmol/L (ref 98–111)
GFR calc non Af Amer: >60 mL/min (ref >60)
Glucose, Bld: 131 mg/dL — ABNORMAL HIGH (ref 70–99)
Potassium: 3.7 mmol/L (ref 3.5–5.1)
Sodium: 139 mmol/L (ref 135–145)

## 2017-11-12 LAB — CBC WITH DIFFERENTIAL/PLATELET
BASOS ABS: 0.1 10*3/uL (ref 0.0–0.1)
Basophils Relative: 1 %
EOS ABS: 0.1 10*3/uL (ref 0.0–0.7)
Eosinophils Relative: 1 %
HEMATOCRIT: 23.9 % — AB (ref 39.0–52.0)
Hemoglobin: 8.2 g/dL — ABNORMAL LOW (ref 13.0–17.0)
LYMPHS ABS: 1.5 10*3/uL (ref 0.7–4.0)
Lymphocytes Relative: 16 %
MCH: 32.2 pg (ref 26.0–34.0)
MCHC: 34.3 g/dL (ref 30.0–36.0)
MCV: 93.7 fL (ref 78.0–100.0)
Monocytes Absolute: 1.6 10*3/uL — ABNORMAL HIGH (ref 0.1–1.0)
Monocytes Relative: 18 %
NEUTROS ABS: 5.8 10*3/uL (ref 1.7–7.7)
Neutrophils Relative %: 64 %
Platelets: 40 10*3/uL — ABNORMAL LOW (ref 150–400)
RBC: 2.55 MIL/uL — ABNORMAL LOW (ref 4.22–5.81)
RDW: 18.1 % — AB (ref 11.5–15.5)
WBC: 9.1 10*3/uL (ref 4.0–10.5)

## 2017-11-12 LAB — HEPATIC FUNCTION PANEL
ALK PHOS: 110 U/L (ref 38–126)
ALT: 33 U/L (ref 0–44)
AST: 42 U/L — AB (ref 15–41)
Albumin: 2 g/dL — ABNORMAL LOW (ref 3.5–5.0)
BILIRUBIN TOTAL: 5 mg/dL — AB (ref 0.3–1.2)
Bilirubin, Direct: 2.8 mg/dL — ABNORMAL HIGH (ref 0.0–0.2)
Indirect Bilirubin: 2.2 mg/dL — ABNORMAL HIGH (ref 0.3–0.9)
Total Protein: 5.6 g/dL — ABNORMAL LOW (ref 6.5–8.1)

## 2017-11-12 MED ORDER — LORATADINE 10 MG PO TABS: 10.0000 mg | ORAL_TABLET | Freq: Every day | ORAL | 0 refills | Status: DC

## 2017-11-12 MED ORDER — FLUTICASONE PROPIONATE 50 MCG/ACT NA SUSP: 2.0000 | Freq: Every day | NASAL | 0 refills | Status: DC | PRN

## 2017-11-12 MED ORDER — BENZONATATE 200 MG PO CAPS: 200.0000 mg | ORAL_CAPSULE | Freq: Three times a day (TID) | ORAL | 0 refills | Status: AC

## 2017-11-12 MED ORDER — NICOTINE 21 MG/24HR TD PT24: 21.0000 mg | MEDICATED_PATCH | Freq: Every day | TRANSDERMAL | 0 refills | Status: DC

## 2017-11-12 MED ORDER — LEVOFLOXACIN 750 MG PO TABS: 750.0000 mg | ORAL_TABLET | Freq: Every day | ORAL | 0 refills | Status: DC

## 2017-11-12 NOTE — Progress Notes (Signed)
11/12/17  1530  Reviewed discharge instructions with patient. Patient verbalized understanding of discharge instructions. Copy of discharge instructions and prescriptions given to patient.

## 2017-11-12 NOTE — Progress Notes (Signed)
These preliminary result these preliminary results were noted.  Awaiting final report.

## 2017-11-12 NOTE — Plan of Care (Signed)
  Problem: Nutrition: Goal: Adequate nutrition will be maintained Outcome: Progressing   Problem: Elimination: Goal: Will not experience complications related to bowel motility Outcome: Progressing   Problem: Pain Managment: Goal: General experience of comfort will improve Outcome: Progressing   

## 2017-11-12 NOTE — Discharge Summary (Signed)
Physician Discharge Summary  Robert Compton QJJ:[id number] DOB: 08-25-1945 DOA: 11/08/2017  PCP: Robert Solian, MD  Admit date: 11/08/2017 Discharge date: 11/12/2017  Time spent: 55 minutes  Recommendations for Outpatient Follow-up:  1. Follow-up with Dr. Lorna Compton, oncology as scheduled.  On follow-up patient will need a CBC with differential done to follow-up on his counts.  Patient also need a comprehensive metabolic profile done to follow-up on electrolytes, renal function and LFTs.   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 male with medical history significant of SCLC with mets to liver causing jaundice.  Chemo with carboplatin and etoposide last week.  Increasing fatigue, jaundice, dizziness for past couple of days, decreased PO intake.  Sent in from cancer center for febrile neutropenia.   ED Course: Tm 100.5, initial BP 91 systolic improved to 481 systolic after IVF.  HR 116 now down to 99.  Pancytopenia with WBC 0.4, HGB 6.6, platelets of 6.  Trop 0.10.  Lactate of 3.3 initially, improved 2.5 after IVF.  Creat 1.3 up from 0.88.  Bili 11.6.  CT abd/pelvis suggests enteritis.    Hospital Course:  1 severe sepsis/febrile neutropenia Questionable etiology.  Patient noted on admission to be in severe sepsis with hypotension, lactic  acidosis, pancytopenia with a ANC of 0.0.  Chest x-ray done on admission was negative for any acute cardiopulmonary disease.  Blood cultures were ordered at the cancer center and with no growth to date by day of discharge.  Urinalysis nitrite negative leukocytes negative.  Patient denied any diarrhea.  Patient initially placed in the stepdown unit where he remained afebrile.  Lactic acid levels trending down.  Patient was placed on neutropenic precautions and patient placed empirically on IV vancomycin, IV aztreonam, IV Flagyl during the hospitalization.  Patient improved on a daily basis and counts improved.  Patient was subsequently transitioned from IV antibiotics to oral Levaquin 750 mg daily to complete a 7-day course of antibiotic treatment with Levaquin.  Patient will be discharged home on 5 more days of oral Levaquin to complete a 7-day course of antibiotic treatment.  Outpatient follow-up with oncology.    2.  Antineoplastic chemotherapy-induced pancytopenia Patient had presented with a severe pancytopenia with a white count of 0.4 and a ANC of 0.0.  Patient noted to have a hemoglobin drop as low as 6.6 and the platelet count as low as 5.  Patient status post 2 units packed red blood cells hemoglobin stabilized at 8.2 by day of discharge. Patient status post 2 units of platelets with platelet count stabilizing at 40 by day of discharge.  Patient noted to have received Neulasta after chemotherapy last week.  WBC trended up and was 9.1 by day of discharge from 0.4 on admission.  Patient was initially placed on neutropenic precautions during the hospitalization while on empiric IV antibiotics.  Patient was seen in consultation by oncology.  Outpatient follow-up with oncology.    3.  Metastatic extensive stage small cell carcinoma of the lung Patient on carboplatin and etoposide with Neulasta support with last treatment 10/29/2017 with Neulasta dosed on 11/02/2017. Patient was followed by oncology  throughout the hospitalization.  4.  Hyperbilirubinemia/jaundice Likely secondary to metastatic disease.  CT abdomen and pelvis which was done on admission with hepatic metastases with no biliary dilatation.  Bilirubin levels went up as high as 11.9 on day of admission and trended down during the hospitalization as such that by day of discharge bilirubin was down to 5.0.  Outpatient follow-up.   5.  Acute kidney injury Likely secondary to a prerenal azotemia.    Resolved with hydration.   6.  Diabetes mellitus type 2 Hemoglobin A1c was 7.2 on 09/28/2017.   Patient's oral hypoglycemic agents were held throughout the hospitalization and patient maintained on sliding scale insulin.  Blood glucose was well controlled during the hospitalization.  7 gastroesophageal reflux disease Maintained on a PPI.  8.  Pressure injury of the skin stage II bilateral inner buttocks Wound care done during the hospitalization.  Outpatient follow-up.    9.  Dehydration Hydrated with IV fluids, was euvolemic by day of discharge.    10.  Tobacco abuse Tobacco cessation.  Patient placed on nicotine patch throughout the hospitalization.       Procedures:  Chest x-ray 11/08/2017  CT abdomen and pelvis 11/08/2017  2 units packed red blood cells 11/09/2017  1 unit platelets 11/08/2017  1 UNIT Platelets 11/09/2017      Consultations:  Oncology: Dr. Alen Compton 11/09/2017  Discharge Exam: Vitals:   11/11/17 2130 11/12/17 0603  BP: 123/75 125/78  Pulse: 88 93  Resp: 20 20  Temp: 98.8 F (37.1 C) 97.7 F (36.5 C)  SpO2: 99% 95%    General: Less jaundiced. Cardiovascular: Regular rate and rhythm no murmurs rubs or gallops. Respiratory: Lungs clear to auscultation bilaterally.  No wheezes, no crackles, no rhonchi.   Discharge Instructions   Discharge Instructions    Diet Carb Modified   Complete by:  As directed    Increase activity slowly   Complete by:  As directed      Allergies  as of 11/12/2017      Reactions   Amoxicillin-pot Clavulanate Anaphylaxis   Nitrofurantoin Anaphylaxis      Medication List    STOP taking these medications   aspirin 81 MG tablet     TAKE these medications   benzonatate 200 MG capsule Commonly known as:  TESSALON Take 1 capsule (200 mg total) by mouth 3 (three) times daily for 4 days.   fish oil-omega-3 fatty acids 1000 MG capsule Take 1 g by mouth 2 (two) times daily.   fluticasone 50 MCG/ACT nasal spray Commonly known as:  FLONASE Place 2 sprays into both nostrils daily as needed for allergies. What changed:  how much to take   insulin aspart 100 UNIT/ML injection Commonly known as:  novoLOG CBG < 70: implement hypoglycemia protocol CBG 70 - 120: 0 units CBG 121 - 150: 2 units CBG 151 - 200: 3 units CBG 201 - 250: 5 units CBG 251 - 300: 8 units CBG 301 - 350: 11 units CBG 351 - 400: 15 units CBG > 400: call MD   levofloxacin 750 MG tablet Commonly known as:  LEVAQUIN Take 1 tablet (750 mg total) by mouth daily at 3 pm. Start taking on:  11/13/2017   loratadine 10 MG tablet Commonly known as:  CLARITIN Take 1 tablet (10 mg total) by mouth daily. Start taking on:  11/13/2017   Melatonin 3 MG Caps Take 3 mg by mouth at bedtime.   metFORMIN 1000 MG tablet Commonly known as:  GLUCOPHAGE Take 1,000 mg by mouth 2 (two) times daily with a meal.   methocarbamol 500 MG tablet Commonly known as:  ROBAXIN Take 1 tablet (500 mg total) by mouth 3 (three) times daily.   nicotine 21 mg/24hr patch Commonly known as:  NICODERM CQ - dosed in mg/24 hours Place 1 patch (21 mg total) onto the skin daily.   omeprazole 20 MG capsule Commonly known as:  PRILOSEC Take 20 mg by mouth daily.   ondansetron 8 MG disintegrating tablet Commonly known as:  ZOFRAN-ODT Take 8 mg by mouth every 6 (six) hours as needed for nausea or vomiting. for nausea   polyethylene glycol packet Commonly known as:  MIRALAX / GLYCOLAX Take 17 g by mouth  daily. What changed:    when to take this  reasons to take this   Red Yeast [place] 600 MG Tabs Take 1 tablet by mouth 2 (two) times daily.   traMADol 50 MG tablet Commonly known as:  ULTRAM Take 1 tablet (50 mg total) by mouth every 6 (six) hours as needed for moderate pain. What changed:  when to take this            Durable Medical Equipment  (From admission, onward)        Start     Ordered   11/11/17 0937  For home use only DME Hospital bed  Once    Question Answer Comment  Patient has (list medical condition): weakness   The above medical condition requires: Patient requires the ability to reposition frequently   Bed type Semi-electric   Support Surface: Gel Overlay      11/11/17 0938     Allergies  Allergen Reactions  . Amoxicillin-Pot Clavulanate Anaphylaxis  . Nitrofurantoin Anaphylaxis   Follow-up Information    Curt Bears, MD Follow up.   Specialty:  Oncology Why:  Follow-up as scheduled. Contact information: [place] [id number] [phone]            The results of significant diagnostics from this hospitalization (including imaging, microbiology, ancillary and laboratory) are listed below for reference.    Significant Diagnostic Studies: Dg Eye Foreign Body  Result Date: 10/23/2017 CLINICAL DATA:  Metal working/exposure; clearance prior to MRI EXAM: ORBITS FOR FOREIGN BODY - 2 VIEW COMPARISON:  None. FINDINGS: Water's views thighs deviated to the left and to the right were obtained. No intraorbital radiopaque foreign body. There are small frontal osteomas in the left frontal sinus. Paranasal sinuses elsewhere clear. There is leftward deviation of the nasal septum. No fracture or dislocation. IMPRESSION: No evidence of metallic foreign body within the orbits. Electronically Signed   By: Lowella Grip III M.D.   On: 10/23/2017 16:03   Dg Chest 2 View  Result Date: 11/08/2017 CLINICAL DATA:  Weakness and  jaundice.  History of lung cancer. EXAM: CHEST - 2 VIEW COMPARISON:  October 04, 2017 FINDINGS: The heart size and mediastinal contours are within normal limits. Both lungs are clear. The visualized skeletal structures are unremarkable. IMPRESSION: No active cardiopulmonary disease. Electronically Signed   By: Dorise Bullion III M.D   On: 11/08/2017 18:55   Mr Brain W Wo Contrast  Result Date: 10/23/2017  CLINICAL DATA:  72 y/o M; lung cancer, evaluation for metastatic disease, SRS protocol. EXAM: MRI HEAD WITHOUT AND WITH CONTRAST TECHNIQUE: Multiplanar, multiecho pulse sequences of the brain and surrounding structures were obtained without and with intravenous contrast. CONTRAST:  37mL MULTIHANCE GADOBENATE DIMEGLUMINE 529 MG/ML IV SOLN COMPARISON:  10/21/2017 PET-CT. FINDINGS: Brain: Several nonspecific foci of T2 FLAIR hyperintense signal abnormality in subcortical and periventricular white matter are compatible with moderate chronic microvascular ischemic changes for age. Moderate brain parenchymal volume loss. No evidence for stroke, hemorrhage, mass effect, hydrocephalus, extra-axial collection, or herniation. After administration of intravenous contrast there is no abnormal enhancement of the brain. Vascular: Normal flow voids. Skull and upper cervical spine: Numerous enhancing lesions throughout the visible cervical spine and the calvarium predominantly posteriorly compatible with metastatic disease. Sinuses/Orbits: Negative. Other: None. IMPRESSION: 1. No intracranial metastatic disease identified. 2. Multiple enhancing lesions of the upper cervical spine and calvarium compatible with bony metastasis. No extra osseous extension of neoplasm. 3. Moderate chronic microvascular ischemic changes and parenchymal volume loss of the brain. Electronically Signed   By: Kristine Garbe M.D.   On: 10/23/2017 22:35   Ct Abdomen Pelvis W Contrast  Result Date: 11/08/2017 CLINICAL DATA:  72 year old male  with new onset jaundice, fatigue and weight loss. Patient with known metastatic small cell lung cancer, undergoing chemotherapy. EXAM: CT ABDOMEN AND PELVIS WITH CONTRAST TECHNIQUE: Multidetector CT imaging of the abdomen and pelvis was performed using the standard protocol following bolus administration of intravenous contrast. CONTRAST:  180mL ISOVUE-300 IOPAMIDOL (ISOVUE-300) INJECTION 61% COMPARISON:  10/21/2017 head CT, 09/23/2017 abdominal CT and prior studies FINDINGS: Lower chest: Mild bibasilar atelectasis noted. Hepatobiliary: Multiple hepatic lesions are again noted do not appear significantly changed from prior studies. The largest lesion measures 3 cm (series 3: Image 31). No biliary dilatation. Gallbladder is unremarkable. There is no evidence of biliary dilatation. Pancreas: Unremarkable Spleen: Unremarkable Adrenals/Urinary Tract: The kidneys, adrenal glands and bladder are unremarkable except for a LEFT renal cyst. Stomach/Bowel: Apparent circumferential wall thickening of small bowel loops in the LEFT abdomen noted with mild adjacent inflammation. Probable associated areas of fluid within the LEFT abdomen noted, some adjacent to small bowel loops. There is no evidence of bowel obstruction or pneumoperitoneum. Stomach is unremarkable. Vascular/Lymphatic: Aortic atherosclerosis. No enlarged abdominal or pelvic lymph nodes. The visualized mesenteric arteries are patent. Reproductive: Prostate is unremarkable. Other: A small amount of free fluid within the pelvis is noted. Musculoskeletal: No discrete focal bony lesions identified. IMPRESSION: . 1. Apparent circumferential wall thickening of some small bowel loops within the LEFT abdomen with mild adjacent inflammation and small amount of adjacent fluid/pelvic ascites, likely representing enteritis with reactive fluid. No evidence of bowel obstruction or pneumoperitoneum. 2. Hepatic metastases again identified.  No biliary dilatation. 3.  Aortic  Atherosclerosis (ICD10-I70.0). Electronically Signed   By: Margarette Canada M.D.   On: 11/08/2017 19:06   Nm Pet Image Initial (pi) Skull Base To Thigh  Result Date: 10/21/2017 CLINICAL DATA:  Initial treatment strategy for lung cancer. EXAM: NUCLEAR MEDICINE PET SKULL BASE TO THIGH TECHNIQUE: 15.03 mCi F-18 FDG was injected intravenously. Full-ring PET imaging was performed from the skull base to thigh after the radiotracer. CT data was obtained and used for attenuation correction and anatomic localization. Fasting blood glucose: 113 mg/dl COMPARISON:  CT chest 09/23/2017 FINDINGS: Mediastinal blood pool activity: SUV max 3.42 NECK: No hypermetabolic lymph nodes in the neck. Incidental CT findings: none CHEST: Enlarged and hypermetabolic right paratracheal lymph nodes.  Index right paratracheal lymph node measures 2.3 cm and has an SUV max of 7.46. Previously this measured 3.4 cm. Azygoesophageal lymph node measures 1.2 cm and has an SUV max of 4.73. Previously this lymph node measured 1.7 cm. No hypermetabolic supraclavicular or axillary lymph nodes. Pulmonary nodule in the right apex measures 1.1 cm and has an SUV max of 5.12. Previously this measured 1.4 cm. Right upper lobe perihilar nodule measures 1.5 cm and has an SUV max of 16.01. Previously this measured 1.6 cm. Perifissural nodule within the right middle lobe measures 7 mm and has an SUV max of 0.94. Incidental CT findings: Aortic atherosclerosis. Calcifications in the LAD, RCA and left circumflex coronary arteries noted. ABDOMEN/PELVIS: No abnormal radiotracer uptake identified within the liver. Specifically there are no areas of increased uptake to correspond with the biopsy-proven liver metastasis. No abnormal tracer uptake identified within the pancreas or spleen. The adrenal glands are unremarkable. No hypermetabolic lymph nodes within the abdomen or pelvis. Focal area of increased activity is identified within the anus within SUV max of 11.9. This is  nonspecific and may be physiologic. Incidental CT findings: none SKELETON: There is diffuse radiotracer uptake identified throughout the axial and appendicular skeleton. No dominant foci of increased uptake identified. SUV max within the right iliac bone is equal to 4.63. Incidental CT findings: none IMPRESSION: 1. Interval response to therapy compared with CT CAP from 09/23/2017. There is been decrease in size of right upper lobe pulmonary nodules and right paratracheal and posterior mediastinal lymph nodes. 2. Liver metastasis are again noted. No significant FDG uptake identified within these lesions above background liver activity. 3. There is diffuse radiotracer uptake throughout the axial and appendicular skeleton. Although nonspecific this may reflect treatment related changes. 4. Aortic atherosclerosis and multi vessel coronary artery atherosclerotic calcifications. Electronically Signed   By: Kerby Moors M.D.   On: 10/21/2017 16:12    Microbiology: Recent Results (from the past 240 hour(s))  Culture, Blood     Status: None (Preliminary result)   Collection Time: 11/08/17  4:23 PM  Result Value Ref Range Status   Specimen Description   Final    BLOOD RIGHT ARM Performed at [facility], [address], [place], [place] [address]    Special Requests   Final    BOTTLES DRAWN AEROBIC AND ANAEROBIC Blood Culture adequate volume   Culture   Final    NO GROWTH 3 DAYS Performed at [facility], [place] [address] [place], [place] [address]    Report Status PENDING  Incomplete  Culture, Blood     Status: None (Preliminary result)   Collection Time: 11/08/17  4:33 PM  Result Value Ref Range Status   Specimen Description   Final    BLOOD LEFT ARM Performed at [facility], [place] [address], [place], [place] [address]    Special Requests   Final    BOTTLES DRAWN AEROBIC AND ANAEROBIC Blood Culture adequate volume   Culture   Final    NO  GROWTH 3 DAYS Performed at [facility], [place] [address] [place], [place] [address]    Report Status PENDING  Incomplete  MRSA PCR Screening     Status: None   Collection Time: 11/08/17 10:13 PM  Result Value Ref Range Status   MRSA by PCR NEGATIVE NEGATIVE Final    Comment:        The GeneXpert MRSA Assay (FDA approved for NASAL specimens  only), is one component of a comprehensive MRSA colonization surveillance program. It is not intended to diagnose MRSA infection nor to guide or monitor treatment for MRSA infections. Performed at [facility], [place] [address], [place], [place] [address]   Urine culture     Status: None   Collection Time: 11/09/17  4:47 AM  Result Value Ref Range Status   Specimen Description   Final    URINE, CLEAN CATCH Performed at [facility], [place] [address] [place], [place] [address]    Special Requests   Final    NONE Performed at [facility], [place] [address] [place], [place] [address]    Culture   Final    NO GROWTH Performed at [facility], [place] [address], [place], [place] [address]    Report Status 11/10/2017 FINAL  Final     Labs: Basic Metabolic Panel: Recent Labs  Lab 11/08/17 1750 11/09/17 0843 11/10/17 0312 11/11/17 0431 11/12/17 0412  NA 135 137 138 135 139  K 4.9 4.4 3.9 3.6 3.7  CL 102 105 107 105 106  CO2 23 23 24 24 28   GLUCOSE 216* 152* 133* 147* 131*  BUN 23 29* 21 16 13   CREATININE 1.32* 1.16 0.80 0.81 0.70  CALCIUM 8.2* 8.0* 7.9* 7.7* 8.0*  MG  --  1.7 2.0 1.8  --    Liver Function Tests: Recent Labs  Lab 11/08/17 1750 11/09/17 0843 11/10/17 0312 11/11/17 0431 11/12/17 0412  AST 32 23 30 34 42*  ALT 32 29 28 31  33  ALKPHOS 96 91 87 91 110  BILITOT 11.6* 9.6* 7.2* 5.4* 5.0*  PROT 5.8* 5.6* 5.4* 5.0* 5.6*  ALBUMIN 2.0* 2.0* 1.9* 1.8* 2.0*   Recent Labs  Lab 11/08/17 1750  LIPASE 22   Recent Labs  Lab 11/08/17 1750  AMMONIA 25    CBC: Recent Labs  Lab 11/09/17 0843 11/09/17 1554 11/10/17 0312 11/11/17 0431 11/12/17 0412  WBC 0.9* 1.1* 1.8* 4.6 9.1  NEUTROABS 0.1* 0.2* 0.9* 2.3 5.8  HGB 7.8* 7.3* 7.8* 8.0* 8.2*  HCT 22.5* 21.0* 22.1* 22.6* 23.9*  MCV 92.2 89.7 92.1 91.5 93.7  PLT 9* 23* 22* 30* 40*   Cardiac Enzymes: Recent Labs  Lab 11/08/17 1750  TROPONINI 0.10*   BNP: BNP (last 3 results) No results for input(s): BNP in the last 8760 hours.  ProBNP (last 3 results) No results for input(s): PROBNP in the last 8760 hours.  CBG: Recent Labs  Lab 11/11/17 0736 11/11/17 1158 11/11/17 1702 11/11/17 2133 11/12/17 0737  GLUCAP 129* 140* 150* 139* 125*       Signed:  Irine Seal MD.  [facility] 11/12/2017, 11:56 AM

## 2017-11-12 NOTE — Care Management Note (Signed)
Case Management Note  Patient Details  Name: Robert Compton MRN: [id number] Date of Birth: [birth year]  This CM was informed by [facility] rep that they would be unable to deliver hospital bed to pt today due to lack of bed availability. [facility] rep states that they will have more beds in tomorrow and pt will have one delivered then. This CM spoke with pt at bedside to inform him of above information. Pt states that he is going home today and states " I can sleep in my recliner tonight like I have for the last 11 years" Pt states he is ok with hospital bed being delivered tomorrow. [facility] rep made aware.  Lynnell Catalan, RN 11/12/2017, 11:14 AM [phone]

## 2017-11-13 LAB — CULTURE, BLOOD (SINGLE)
Culture: NO GROWTH
Culture: NO GROWTH
SPECIAL REQUESTS: ADEQUATE
Special Requests: ADEQUATE

## 2017-11-13 LAB — GLUCOSE, CAPILLARY: GLUCOSE-CAPILLARY: 172 mg/dL — AB (ref 70–99)

## 2017-11-13 NOTE — Progress Notes (Signed)
These preliminary result these preliminary results were noted.  Awaiting final report.

## 2017-11-18 ENCOUNTER — Inpatient Hospital Stay: Payer: BLUE CROSS/BLUE SHIELD

## 2017-11-18 ENCOUNTER — Inpatient Hospital Stay: Payer: BLUE CROSS/BLUE SHIELD | Attending: Internal Medicine

## 2017-11-18 ENCOUNTER — Telehealth: Payer: Self-pay | Admitting: Internal Medicine

## 2017-11-18 ENCOUNTER — Encounter: Payer: Self-pay | Admitting: Internal Medicine

## 2017-11-18 ENCOUNTER — Inpatient Hospital Stay (HOSPITAL_BASED_OUTPATIENT_CLINIC_OR_DEPARTMENT_OTHER): Payer: BLUE CROSS/BLUE SHIELD | Admitting: Internal Medicine

## 2017-11-18 ENCOUNTER — Ambulatory Visit (HOSPITAL_COMMUNITY): Admission: RE | Admit: 2017-11-18 | Payer: BLUE CROSS/BLUE SHIELD | Source: Ambulatory Visit

## 2017-11-18 VITALS — BP 140/72 | HR 100 | Temp 98.0°F | Resp 20 | Ht 75.0 in | Wt 308.1 lb

## 2017-11-18 DIAGNOSIS — Z5111 Encounter for antineoplastic chemotherapy: Secondary | ICD-10-CM

## 2017-11-18 DIAGNOSIS — C3490 Malignant neoplasm of unspecified part of unspecified bronchus or lung: Secondary | ICD-10-CM

## 2017-11-18 DIAGNOSIS — Z5189 Encounter for other specified aftercare: Secondary | ICD-10-CM | POA: Insufficient documentation

## 2017-11-18 DIAGNOSIS — C3411 Malignant neoplasm of upper lobe, right bronchus or lung: Secondary | ICD-10-CM | POA: Insufficient documentation

## 2017-11-18 DIAGNOSIS — D61818 Other pancytopenia: Secondary | ICD-10-CM | POA: Diagnosis not present

## 2017-11-18 DIAGNOSIS — I10 Essential (primary) hypertension: Secondary | ICD-10-CM

## 2017-11-18 DIAGNOSIS — C787 Secondary malignant neoplasm of liver and intrahepatic bile duct: Secondary | ICD-10-CM

## 2017-11-18 LAB — CBC WITH DIFFERENTIAL (CANCER CENTER ONLY)
BASOS ABS: 0.1 10*3/uL (ref 0.0–0.1)
BASOS PCT: 0 %
Eosinophils Absolute: 0.1 10*3/uL (ref 0.0–0.5)
Eosinophils Relative: 1 %
HEMATOCRIT: 27.7 % — AB (ref 38.4–49.9)
Hemoglobin: 9.1 g/dL — ABNORMAL LOW (ref 13.0–17.1)
LYMPHS PCT: 9 %
Lymphs Abs: 1.5 10*3/uL (ref 0.9–3.3)
MCH: 31.5 pg (ref 27.2–33.4)
MCHC: 32.9 g/dL (ref 32.0–36.0)
MCV: 95.8 fL (ref 79.3–98.0)
Monocytes Absolute: 2.7 10*3/uL — ABNORMAL HIGH (ref 0.1–0.9)
Monocytes Relative: 15 %
NEUTROS ABS: 13.4 10*3/uL — AB (ref 1.5–6.5)
Neutrophils Relative %: 75 %
PLATELETS: 151 10*3/uL (ref 140–400)
RBC: 2.89 MIL/uL — AB (ref 4.20–5.82)
RDW: 19.1 % — ABNORMAL HIGH (ref 11.0–14.6)
WBC: 17.8 10*3/uL — AB (ref 4.0–10.3)

## 2017-11-18 LAB — CMP (CANCER CENTER ONLY)
ALK PHOS: 161 U/L — AB (ref 38–126)
ALT: 28 U/L (ref 0–44)
ANION GAP: 13 (ref 5–15)
AST: 34 U/L (ref 15–41)
Albumin: 2 g/dL — ABNORMAL LOW (ref 3.5–5.0)
BUN: 10 mg/dL (ref 8–23)
CALCIUM: 7.8 mg/dL — AB (ref 8.9–10.3)
CO2: 22 mmol/L (ref 22–32)
Chloride: 103 mmol/L (ref 98–111)
Creatinine: 0.82 mg/dL (ref 0.61–1.24)
GLUCOSE: 166 mg/dL — AB (ref 70–99)
POTASSIUM: 4 mmol/L (ref 3.5–5.1)
Sodium: 138 mmol/L (ref 135–145)
TOTAL PROTEIN: 5.7 g/dL — AB (ref 6.5–8.1)
Total Bilirubin: 3.6 mg/dL (ref 0.3–1.2)

## 2017-11-18 MED ORDER — SODIUM CHLORIDE 0.9 % IV SOLN
600.0000 mg | Freq: Once | INTRAVENOUS | Status: DC
  Filled 2017-11-18: qty 60

## 2017-11-18 MED ORDER — SODIUM CHLORIDE 0.9 % IV SOLN
400.0000 mg | Freq: Once | INTRAVENOUS | Status: AC
  Administered 2017-11-18: 400 mg via INTRAVENOUS
  Filled 2017-11-18: qty 40

## 2017-11-18 MED ORDER — PALONOSETRON HCL INJECTION 0.25 MG/5ML
0.2500 mg | Freq: Once | INTRAVENOUS | Status: AC
  Administered 2017-11-18: 0.25 mg via INTRAVENOUS

## 2017-11-18 MED ORDER — PALONOSETRON HCL INJECTION 0.25 MG/5ML
INTRAVENOUS | Status: AC
  Filled 2017-11-18: qty 5

## 2017-11-18 MED ORDER — SODIUM CHLORIDE 0.9 % IV SOLN
Freq: Once | INTRAVENOUS | Status: AC
  Administered 2017-11-18: 13:00:00 via INTRAVENOUS
  Filled 2017-11-18: qty 5

## 2017-11-18 MED ORDER — SODIUM CHLORIDE 0.9 % IV SOLN
Freq: Once | INTRAVENOUS | Status: AC
  Administered 2017-11-18: 13:00:00 via INTRAVENOUS
  Filled 2017-11-18: qty 250

## 2017-11-18 MED ORDER — ETOPOSIDE CHEMO INJECTION 1 GM/50ML
90.0000 mg/m2 | Freq: Once | INTRAVENOUS | Status: AC
  Administered 2017-11-18: 250 mg via INTRAVENOUS
  Filled 2017-11-18: qty 12.5

## 2017-11-18 NOTE — Telephone Encounter (Signed)
Scheduled appt per 8/5 los - pt to get an updated schedule in treatment area.

## 2017-11-18 NOTE — Progress Notes (Signed)
[facility] [phone]   [phone]  OFFICE PROGRESS NOTE  [name], MD [place] [address]  DIAGNOSIS: Extensive stage small cell lung cancer presented with right upper lobe lung mass in addition to few other pulmonary nodules and significant mediastinal lymphadenopathy and liver metastases diagnosed in June 2019.   PRIOR THERAPY: None  CURRENT THERAPY: Systemic chemotherapy with carboplatin for AUC of 4 on day 1 and etoposide 100 mg/M2 on days 1, 2 and 3 with Neulasta support every 3 weeks.  Status post 2 cycles.  INTERVAL HISTORY: Robert Compton 72 y.o. male returns to the clinic today for follow-up visit and accompanied by his son and daughter.  The patient continues to tolerate his systemic chemotherapy fairly well with no concerning complaints except for fatigue.  He was recently admitted to the hospital with questionable sepsis.  He is feeling much better.  He was treated with Levaquin.  The patient denied having any recent weight loss or night sweats.  He has no nausea, vomiting, diarrhea or constipation.  He denied having any headache or visual changes.  He had CT scan of the abdomen during his hospitalization that showed no concerning findings for disease progression.  He was supposed to have CT scan of the chest before his visit today.  The patient is here today for evaluation before starting cycle #3.  MEDICAL HISTORY: Past Medical History:  Diagnosis Date  . Arthritis   . Cancer ([facility])   . Diabetes mellitus without complication ([facility])   . Hypertension     ALLERGIES:  is allergic to amoxicillin-pot clavulanate and nitrofurantoin.  MEDICATIONS:  Current Outpatient Medications  Medication Sig Dispense Refill  . fish oil-omega-3 fatty acids 1000 MG capsule Take 1 g by mouth 2 (two) times daily.    . fluticasone (FLONASE) 50 MCG/ACT nasal spray Place 2 sprays into both nostrils daily as needed for allergies.  16 g 0  . insulin aspart (NOVOLOG) 100 UNIT/ML injection CBG < 70: implement hypoglycemia protocol CBG 70 - 120: 0 units CBG 121 - 150: 2 units CBG 151 - 200: 3 units CBG 201 - 250: 5 units CBG 251 - 300: 8 units CBG 301 - 350: 11 units CBG 351 - 400: 15 units CBG > 400: call MD 10 mL 11  . levofloxacin (LEVAQUIN) 750 MG tablet Take 1 tablet (750 mg total) by mouth daily at 3 pm. 5 tablet 0  . loratadine (CLARITIN) 10 MG tablet Take 1 tablet (10 mg total) by mouth daily. 30 tablet 0  . Melatonin 3 MG CAPS Take 3 mg by mouth at bedtime.     . metFORMIN (GLUCOPHAGE) 1000 MG tablet Take 1,000 mg by mouth 2 (two) times daily with a meal.    . methocarbamol (ROBAXIN) 500 MG tablet Take 1 tablet (500 mg total) by mouth 3 (three) times daily. 30 tablet 0  . nicotine (NICODERM CQ - DOSED IN MG/24 HOURS) 21 mg/24hr patch Place 1 patch (21 mg total) onto the skin daily. 28 patch 0  . omeprazole (PRILOSEC) 20 MG capsule Take 20 mg by mouth daily.    . ondansetron (ZOFRAN-ODT) 8 MG disintegrating tablet Take 8 mg by mouth every 6 (six) hours as needed for nausea or vomiting. for nausea  0  . polyethylene glycol (MIRALAX / GLYCOLAX) packet Take 17 g by mouth daily. (Patient taking differently: Take 17 g by mouth daily as needed for moderate constipation. ) 14 each  0  . Red Yeast Rice 600 MG TABS Take 1 tablet by mouth 2 (two) times daily.     . traMADol (ULTRAM) 50 MG tablet Take 1 tablet (50 mg total) by mouth every 6 (six) hours as needed for moderate pain. (Patient taking differently: Take 50 mg by mouth every 4 (four) hours as needed for moderate pain. ) 20 tablet 0   No current facility-administered medications for this visit.     SURGICAL HISTORY:  Past Surgical History:  Procedure Laterality Date  . JOINT REPLACEMENT Right    knee    REVIEW OF SYSTEMS:  Constitutional: positive for anorexia and fatigue Eyes: negative Ears, nose, mouth, throat, and face: negative Respiratory: positive for dyspnea  on exertion Cardiovascular: negative Gastrointestinal: negative Genitourinary:negative Integument/breast: negative Hematologic/lymphatic: negative Musculoskeletal:positive for muscle weakness Neurological: negative Behavioral/Psych: negative Endocrine: negative Allergic/Immunologic: negative   PHYSICAL EXAMINATION: General appearance: alert, cooperative, fatigued and no distress Head: Normocephalic, without obvious abnormality, atraumatic Neck: no adenopathy, no JVD, supple, symmetrical, trachea midline and thyroid not enlarged, symmetric, no tenderness/mass/nodules Lymph nodes: Cervical, supraclavicular, and axillary nodes normal. Resp: clear to auscultation bilaterally Back: symmetric, no curvature. ROM normal. No CVA tenderness. Cardio: regular rate and rhythm, S1, S2 normal, no murmur, click, rub or gallop GI: soft, non-tender; bowel sounds normal; no masses,  no organomegaly Extremities: extremities normal, atraumatic, no cyanosis or edema Neurologic: Alert and oriented X 3, normal strength and tone. Normal symmetric reflexes. Normal coordination and gait  ECOG PERFORMANCE STATUS: 1 - Symptomatic but completely ambulatory  Blood pressure 140/72, pulse 100, temperature 98 F (36.7 C), temperature source Oral, resp. rate 20, height 6\' 3"  (1.905 m), weight (!) 308 lb 1.6 oz (139.8 kg), SpO2 99 %.  LABORATORY DATA: Lab Results  Component Value Date   WBC 17.8 (H) 11/18/2017   HGB 9.1 (L) 11/18/2017   HCT 27.7 (L) 11/18/2017   MCV 95.8 11/18/2017   PLT 151 11/18/2017      Chemistry      Component Value Date/Time   NA 138 11/18/2017 1043   K 4.0 11/18/2017 1043   CL 103 11/18/2017 1043   CO2 22 11/18/2017 1043   BUN 10 11/18/2017 1043   CREATININE 0.82 11/18/2017 1043      Component Value Date/Time   CALCIUM 7.8 (L) 11/18/2017 1043   ALKPHOS 161 (H) 11/18/2017 1043   AST 34 11/18/2017 1043   ALT 28 11/18/2017 1043   BILITOT 3.6 (HH) 11/18/2017 1043        RADIOGRAPHIC STUDIES: Dg Eye Foreign Body  Result Date: 10/23/2017 CLINICAL DATA:  Metal working/exposure; clearance prior to MRI EXAM: ORBITS FOR FOREIGN BODY - 2 VIEW COMPARISON:  None. FINDINGS: Water's views thighs deviated to the left and to the right were obtained. No intraorbital radiopaque foreign body. There are small frontal osteomas in the left frontal sinus. Paranasal sinuses elsewhere clear. There is leftward deviation of the nasal septum. No fracture or dislocation. IMPRESSION: No evidence of metallic foreign body within the orbits. Electronically Signed   By: Lowella Grip III M.D.   On: 10/23/2017 16:03   Dg Chest 2 View  Result Date: 11/08/2017 CLINICAL DATA:  Weakness and jaundice.  History of lung cancer. EXAM: CHEST - 2 VIEW COMPARISON:  October 04, 2017 FINDINGS: The heart size and mediastinal contours are within normal limits. Both lungs are clear. The visualized skeletal structures are unremarkable. IMPRESSION: No active cardiopulmonary disease. Electronically Signed   By: Dorise Bullion III M.D  On: 11/08/2017 18:55   Mr Jeri Cos GU Contrast  Result Date: 10/23/2017 CLINICAL DATA:  72 y/o M; lung cancer, evaluation for metastatic disease, SRS protocol. EXAM: MRI HEAD WITHOUT AND WITH CONTRAST TECHNIQUE: Multiplanar, multiecho pulse sequences of the brain and surrounding structures were obtained without and with intravenous contrast. CONTRAST:  53mL MULTIHANCE GADOBENATE DIMEGLUMINE 529 MG/ML IV SOLN COMPARISON:  10/21/2017 PET-CT. FINDINGS: Brain: Several nonspecific foci of T2 FLAIR hyperintense signal abnormality in subcortical and periventricular white matter are compatible with moderate chronic microvascular ischemic changes for age. Moderate brain parenchymal volume loss. No evidence for stroke, hemorrhage, mass effect, hydrocephalus, extra-axial collection, or herniation. After administration of intravenous contrast there is no abnormal enhancement of the brain.  Vascular: Normal flow voids. Skull and upper cervical spine: Numerous enhancing lesions throughout the visible cervical spine and the calvarium predominantly posteriorly compatible with metastatic disease. Sinuses/Orbits: Negative. Other: None. IMPRESSION: 1. No intracranial metastatic disease identified. 2. Multiple enhancing lesions of the upper cervical spine and calvarium compatible with bony metastasis. No extra osseous extension of neoplasm. 3. Moderate chronic microvascular ischemic changes and parenchymal volume loss of the brain. Electronically Signed   By: Kristine Garbe M.D.   On: 10/23/2017 22:35   Ct Abdomen Pelvis W Contrast  Result Date: 11/08/2017 CLINICAL DATA:  72 year old male with new onset jaundice, fatigue and weight loss. Patient with known metastatic small cell lung cancer, undergoing chemotherapy. EXAM: CT ABDOMEN AND PELVIS WITH CONTRAST TECHNIQUE: Multidetector CT imaging of the abdomen and pelvis was performed using the standard protocol following bolus administration of intravenous contrast. CONTRAST:  152mL ISOVUE-300 IOPAMIDOL (ISOVUE-300) INJECTION 61% COMPARISON:  10/21/2017 head CT, 09/23/2017 abdominal CT and prior studies FINDINGS: Lower chest: Mild bibasilar atelectasis noted. Hepatobiliary: Multiple hepatic lesions are again noted do not appear significantly changed from prior studies. The largest lesion measures 3 cm (series 3: Image 31). No biliary dilatation. Gallbladder is unremarkable. There is no evidence of biliary dilatation. Pancreas: Unremarkable Spleen: Unremarkable Adrenals/Urinary Tract: The kidneys, adrenal glands and bladder are unremarkable except for a LEFT renal cyst. Stomach/Bowel: Apparent circumferential wall thickening of small bowel loops in the LEFT abdomen noted with mild adjacent inflammation. Probable associated areas of fluid within the LEFT abdomen noted, some adjacent to small bowel loops. There is no evidence of bowel obstruction or  pneumoperitoneum. Stomach is unremarkable. Vascular/Lymphatic: Aortic atherosclerosis. No enlarged abdominal or pelvic lymph nodes. The visualized mesenteric arteries are patent. Reproductive: Prostate is unremarkable. Other: A small amount of free fluid within the pelvis is noted. Musculoskeletal: No discrete focal bony lesions identified. IMPRESSION: . 1. Apparent circumferential wall thickening of some small bowel loops within the LEFT abdomen with mild adjacent inflammation and small amount of adjacent fluid/pelvic ascites, likely representing enteritis with reactive fluid. No evidence of bowel obstruction or pneumoperitoneum. 2. Hepatic metastases again identified.  No biliary dilatation. 3.  Aortic Atherosclerosis (ICD10-I70.0). Electronically Signed   By: Margarette Canada M.D.   On: 11/08/2017 19:06   Nm Pet Image Initial (pi) Skull Base To Thigh  Result Date: 10/21/2017 CLINICAL DATA:  Initial treatment strategy for lung cancer. EXAM: NUCLEAR MEDICINE PET SKULL BASE TO THIGH TECHNIQUE: 15.03 mCi F-18 FDG was injected intravenously. Full-ring PET imaging was performed from the skull base to thigh after the radiotracer. CT data was obtained and used for attenuation correction and anatomic localization. Fasting blood glucose: 113 mg/dl COMPARISON:  CT chest 09/23/2017 FINDINGS: Mediastinal blood pool activity: SUV max 3.42 NECK: No hypermetabolic lymph nodes in  the neck. Incidental CT findings: none CHEST: Enlarged and hypermetabolic right paratracheal lymph nodes. Index right paratracheal lymph node measures 2.3 cm and has an SUV max of 7.46. Previously this measured 3.4 cm. Azygoesophageal lymph node measures 1.2 cm and has an SUV max of 4.73. Previously this lymph node measured 1.7 cm. No hypermetabolic supraclavicular or axillary lymph nodes. Pulmonary nodule in the right apex measures 1.1 cm and has an SUV max of 5.12. Previously this measured 1.4 cm. Right upper lobe perihilar nodule measures 1.5 cm and has  an SUV max of 16.01. Previously this measured 1.6 cm. Perifissural nodule within the right middle lobe measures 7 mm and has an SUV max of 0.94. Incidental CT findings: Aortic atherosclerosis. Calcifications in the LAD, RCA and left circumflex coronary arteries noted. ABDOMEN/PELVIS: No abnormal radiotracer uptake identified within the liver. Specifically there are no areas of increased uptake to correspond with the biopsy-proven liver metastasis. No abnormal tracer uptake identified within the pancreas or spleen. The adrenal glands are unremarkable. No hypermetabolic lymph nodes within the abdomen or pelvis. Focal area of increased activity is identified within the anus within SUV max of 11.9. This is nonspecific and may be physiologic. Incidental CT findings: none SKELETON: There is diffuse radiotracer uptake identified throughout the axial and appendicular skeleton. No dominant foci of increased uptake identified. SUV max within the right iliac bone is equal to 4.63. Incidental CT findings: none IMPRESSION: 1. Interval response to therapy compared with CT CAP from 09/23/2017. There is been decrease in size of right upper lobe pulmonary nodules and right paratracheal and posterior mediastinal lymph nodes. 2. Liver metastasis are again noted. No significant FDG uptake identified within these lesions above background liver activity. 3. There is diffuse radiotracer uptake throughout the axial and appendicular skeleton. Although nonspecific this may reflect treatment related changes. 4. Aortic atherosclerosis and multi vessel coronary artery atherosclerotic calcifications. Electronically Signed   By: Kerby Moors M.D.   On: 10/21/2017 16:12    ASSESSMENT AND PLAN: This is a very pleasant 72 years old white male diagnosed with extensive stage small cell lung cancer presented with right upper lobe lung mass in addition to pulmonary nodules, mediastinal lymphadenopathy and extensive liver metastasis in June  2019. The patient is currently undergoing systemic chemotherapy with carboplatin and etoposide status post 2 cycles. He has been tolerating this treatment well except for fatigue and pancytopenia.  He was also admitted to the hospital recently with questionable neutropenic fever. Recent CT scan of the abdomen and pelvis showed no concerning findings for disease progression. I recommended for the patient to have repeat CT scan of the chest for restaging of his disease. I recommended for him to proceed with cycle #3 today as scheduled. I will see him back for follow-up visit in 3 weeks for evaluation before starting cycle #4. The patient was advised to call immediately if he has any concerning symptoms in the interval. The patient voices understanding of current disease status and treatment options and is in agreement with the current care plan.  All questions were answered. The patient knows to call the clinic with any problems, questions or concerns. We can certainly see the patient much sooner if necessary.  I spent 15 minutes counseling the patient face to face. The total time spent in the appointment was 25 minutes.  Disclaimer: This note was dictated with voice recognition software. Similar sounding words can inadvertently be transcribed and may not be corrected upon review.

## 2017-11-18 NOTE — Progress Notes (Signed)
Per Dr Julien Nordmann , it is ok to treat pt today with Carboplatin and VP_16 and bilirubin 3.6.  Cancelled Ct abd/pelvis  . CT chest rescheduled for tomorrow.

## 2017-11-18 NOTE — Progress Notes (Signed)
Clarified with MD. Continue Carboplatin 400mg  today despite improved Scr.  Scr 0.82, Use Scr 1 for age, AUC 2.7. Continue Etoposide at 90mg /m2 and Udenyca (Not Neulasta) with each cycle.  Hardie Pulley, PharmD, BCPS, BCOP

## 2017-11-18 NOTE — Patient Instructions (Signed)
[facility] Discharge Instructions for Patients Receiving Chemotherapy  Today you received the following chemotherapy agents: Carboplatin & Etoposide.  To help prevent nausea and vomiting after your treatment, we encourage you to take your nausea medication as prescribed.   If you develop nausea and vomiting that is not controlled by your nausea medication, call the clinic.   BELOW ARE SYMPTOMS THAT SHOULD BE REPORTED IMMEDIATELY:  *FEVER GREATER THAN 100.5 F  *CHILLS WITH OR WITHOUT FEVER  NAUSEA AND VOMITING THAT IS NOT CONTROLLED WITH YOUR NAUSEA MEDICATION  *UNUSUAL SHORTNESS OF BREATH  *UNUSUAL BRUISING OR BLEEDING  TENDERNESS IN MOUTH AND THROAT WITH OR WITHOUT PRESENCE OF ULCERS  *URINARY PROBLEMS  *BOWEL PROBLEMS  UNUSUAL RASH Items with * indicate a potential emergency and should be followed up as soon as possible.  Feel free to call the clinic should you have any questions or concerns. The clinic phone number is [phone].  Please show the [facility] at check-in to the Emergency Department and triage nurse.

## 2017-11-18 NOTE — Patient Instructions (Signed)
Steps to Quit Smoking Smoking tobacco can be bad for your health. It can also affect almost every organ in your body. Smoking puts you and people around you at risk for many serious long-lasting (chronic) diseases. Quitting smoking is hard, but it is one of the best things that you can do for your health. It is never too late to quit. What are the benefits of quitting smoking? When you quit smoking, you lower your risk for getting serious diseases and conditions. They can include:  Lung cancer or lung disease.  Heart disease.  Stroke.  Heart attack.  Not being able to have children (infertility).  Weak bones (osteoporosis) and broken bones (fractures).  If you have coughing, wheezing, and shortness of breath, those symptoms may get better when you quit. You may also get sick less often. If you are pregnant, quitting smoking can help to lower your chances of having a baby of low birth weight. What can I do to help me quit smoking? Talk with your doctor about what can help you quit smoking. Some things you can do (strategies) include:  Quitting smoking totally, instead of slowly cutting back how much you smoke over a period of time.  Going to in-person counseling. You are more likely to quit if you go to many counseling sessions.  Using resources and support systems, such as: ? Online chats with a counselor. ? Phone quitlines. ? Printed self-help materials. ? Support groups or group counseling. ? Text messaging programs. ? Mobile phone apps or applications.  Taking medicines. Some of these medicines may have nicotine in them. If you are pregnant or breastfeeding, do not take any medicines to quit smoking unless your doctor says it is okay. Talk with your doctor about counseling or other things that can help you.  Talk with your doctor about using more than one strategy at the same time, such as taking medicines while you are also going to in-person counseling. This can help make  quitting easier. What things can I do to make it easier to quit? Quitting smoking might feel very hard at first, but there is a lot that you can do to make it easier. Take these steps:  Talk to your family and friends. Ask them to support and encourage you.  Call phone quitlines, reach out to support groups, or work with a counselor.  Ask people who smoke to not smoke around you.  Avoid places that make you want (trigger) to smoke, such as: ? Bars. ? Parties. ? Smoke-break areas at work.  Spend time with people who do not smoke.  Lower the stress in your life. Stress can make you want to smoke. Try these things to help your stress: ? Getting regular exercise. ? Deep-breathing exercises. ? Yoga. ? Meditating. ? Doing a body scan. To do this, close your eyes, focus on one area of your body at a time from head to toe, and notice which parts of your body are tense. Try to relax the muscles in those areas.  Download or buy apps on your mobile phone or tablet that can help you stick to your quit plan. There are many free apps, such as QuitGuide from the CDC (Centers for Disease Control and Prevention). You can find more support from smokefree.gov and other websites.  This information is not intended to replace advice given to you by your health care provider. Make sure you discuss any questions you have with your health care provider. Document Released: 01/27/2009 Document   Revised: 11/29/2015 Document Reviewed: 08/17/2014 Elsevier Interactive Patient Education  2018 [employer] Inc.  

## 2017-11-19 ENCOUNTER — Ambulatory Visit (HOSPITAL_COMMUNITY)
Admission: RE | Admit: 2017-11-19 | Discharge: 2017-11-19 | Disposition: A | Discharge status: Home / Self Care | Payer: BLUE CROSS/BLUE SHIELD | Source: Ambulatory Visit | Attending: Nurse Practitioner | Admitting: Nurse Practitioner

## 2017-11-19 ENCOUNTER — Inpatient Hospital Stay: Payer: BLUE CROSS/BLUE SHIELD

## 2017-11-19 ENCOUNTER — Ambulatory Visit (HOSPITAL_COMMUNITY): Payer: BLUE CROSS/BLUE SHIELD

## 2017-11-19 VITALS — BP 128/67 | HR 96 | Temp 97.8°F | Resp 19

## 2017-11-19 DIAGNOSIS — Z5111 Encounter for antineoplastic chemotherapy: Secondary | ICD-10-CM | POA: Diagnosis not present

## 2017-11-19 DIAGNOSIS — I700 Atherosclerosis of aorta: Secondary | ICD-10-CM | POA: Diagnosis not present

## 2017-11-19 DIAGNOSIS — I2510 Atherosclerotic heart disease of native coronary artery without angina pectoris: Secondary | ICD-10-CM | POA: Diagnosis not present

## 2017-11-19 DIAGNOSIS — J90 Pleural effusion, not elsewhere classified: Secondary | ICD-10-CM | POA: Diagnosis not present

## 2017-11-19 DIAGNOSIS — R188 Other ascites: Secondary | ICD-10-CM | POA: Insufficient documentation

## 2017-11-19 DIAGNOSIS — C787 Secondary malignant neoplasm of liver and intrahepatic bile duct: Secondary | ICD-10-CM | POA: Diagnosis not present

## 2017-11-19 DIAGNOSIS — C3490 Malignant neoplasm of unspecified part of unspecified bronchus or lung: Secondary | ICD-10-CM | POA: Insufficient documentation

## 2017-11-19 DIAGNOSIS — N281 Cyst of kidney, acquired: Secondary | ICD-10-CM | POA: Diagnosis not present

## 2017-11-19 MED ORDER — DEXAMETHASONE SODIUM PHOSPHATE 10 MG/ML IJ SOLN
10.0000 mg | Freq: Once | INTRAMUSCULAR | Status: AC
  Administered 2017-11-19: 10 mg via INTRAVENOUS

## 2017-11-19 MED ORDER — SODIUM CHLORIDE 0.9 % IV SOLN
Freq: Once | INTRAVENOUS | Status: AC
  Administered 2017-11-19: 09:00:00 via INTRAVENOUS
  Filled 2017-11-19: qty 250

## 2017-11-19 MED ORDER — IOHEXOL 300 MG/ML  SOLN
75.0000 mL | Freq: Once | INTRAMUSCULAR | Status: AC | PRN
  Administered 2017-11-19: 75 mL via INTRAVENOUS

## 2017-11-19 MED ORDER — SODIUM CHLORIDE 0.9 % IV SOLN
90.0000 mg/m2 | Freq: Once | INTRAVENOUS | Status: AC
  Administered 2017-11-19: 250 mg via INTRAVENOUS
  Filled 2017-11-19: qty 12.5

## 2017-11-19 MED ORDER — DEXAMETHASONE SODIUM PHOSPHATE 10 MG/ML IJ SOLN
INTRAMUSCULAR | Status: AC
  Filled 2017-11-19: qty 1

## 2017-11-19 NOTE — Patient Instructions (Signed)
[facility] Discharge Instructions for Patients Receiving Chemotherapy  Today you received the following chemotherapy agents Etoposide  To help prevent nausea and vomiting after your treatment, we encourage you to take your nausea medication as directed   If you develop nausea and vomiting that is not controlled by your nausea medication, call the clinic.   BELOW ARE SYMPTOMS THAT SHOULD BE REPORTED IMMEDIATELY:  *FEVER GREATER THAN 100.5 F  *CHILLS WITH OR WITHOUT FEVER  NAUSEA AND VOMITING THAT IS NOT CONTROLLED WITH YOUR NAUSEA MEDICATION  *UNUSUAL SHORTNESS OF BREATH  *UNUSUAL BRUISING OR BLEEDING  TENDERNESS IN MOUTH AND THROAT WITH OR WITHOUT PRESENCE OF ULCERS  *URINARY PROBLEMS  *BOWEL PROBLEMS  UNUSUAL RASH Items with * indicate a potential emergency and should be followed up as soon as possible.  Feel free to call the clinic should you have any questions or concerns. The clinic phone number is [phone].  Please show the [facility] at check-in to the Emergency Department and triage nurse.

## 2017-11-20 ENCOUNTER — Inpatient Hospital Stay: Payer: BLUE CROSS/BLUE SHIELD

## 2017-11-20 VITALS — BP 131/60 | HR 94 | Temp 97.8°F | Resp 19

## 2017-11-20 DIAGNOSIS — Z5111 Encounter for antineoplastic chemotherapy: Secondary | ICD-10-CM | POA: Diagnosis not present

## 2017-11-20 DIAGNOSIS — C3490 Malignant neoplasm of unspecified part of unspecified bronchus or lung: Secondary | ICD-10-CM

## 2017-11-20 MED ORDER — DEXAMETHASONE SODIUM PHOSPHATE 10 MG/ML IJ SOLN
10.0000 mg | Freq: Once | INTRAMUSCULAR | Status: AC
  Administered 2017-11-20: 10 mg via INTRAVENOUS

## 2017-11-20 MED ORDER — DEXAMETHASONE SODIUM PHOSPHATE 10 MG/ML IJ SOLN
INTRAMUSCULAR | Status: AC
  Filled 2017-11-20: qty 1

## 2017-11-20 MED ORDER — SODIUM CHLORIDE 0.9 % IV SOLN
Freq: Once | INTRAVENOUS | Status: AC
  Administered 2017-11-20: 10:00:00 via INTRAVENOUS
  Filled 2017-11-20: qty 250

## 2017-11-20 MED ORDER — ETOPOSIDE CHEMO INJECTION 1 GM/50ML
90.0000 mg/m2 | Freq: Once | INTRAVENOUS | Status: AC
  Administered 2017-11-20: 250 mg via INTRAVENOUS
  Filled 2017-11-20: qty 12.5

## 2017-11-20 NOTE — Patient Instructions (Signed)
[facility] Discharge Instructions for Patients Receiving Chemotherapy  Today you received the following chemotherapy agents:  Etoposide  To help prevent nausea and vomiting after your treatment, we encourage you to take your nausea medication as prescribed.   If you develop nausea and vomiting that is not controlled by your nausea medication, call the clinic.   BELOW ARE SYMPTOMS THAT SHOULD BE REPORTED IMMEDIATELY:  *FEVER GREATER THAN 100.5 F  *CHILLS WITH OR WITHOUT FEVER  NAUSEA AND VOMITING THAT IS NOT CONTROLLED WITH YOUR NAUSEA MEDICATION  *UNUSUAL SHORTNESS OF BREATH  *UNUSUAL BRUISING OR BLEEDING  TENDERNESS IN MOUTH AND THROAT WITH OR WITHOUT PRESENCE OF ULCERS  *URINARY PROBLEMS  *BOWEL PROBLEMS  UNUSUAL RASH Items with * indicate a potential emergency and should be followed up as soon as possible.  Feel free to call the clinic should you have any questions or concerns. The clinic phone number is [phone].  Please show the [facility] at check-in to the Emergency Department and triage nurse.

## 2017-11-21 ENCOUNTER — Telehealth: Payer: Self-pay | Admitting: Medical Oncology

## 2017-11-21 DIAGNOSIS — I878 Other specified disorders of veins: Secondary | ICD-10-CM

## 2017-11-21 NOTE — Telephone Encounter (Signed)
Pt interested in port. Request sent to [facility]  Pt contacted [facility] for larger wheelchair .

## 2017-11-22 ENCOUNTER — Inpatient Hospital Stay: Payer: BLUE CROSS/BLUE SHIELD

## 2017-11-22 DIAGNOSIS — Z5111 Encounter for antineoplastic chemotherapy: Secondary | ICD-10-CM | POA: Diagnosis not present

## 2017-11-22 DIAGNOSIS — C3490 Malignant neoplasm of unspecified part of unspecified bronchus or lung: Secondary | ICD-10-CM

## 2017-11-22 MED ORDER — PEGFILGRASTIM-CBQV 6 MG/0.6ML ~~LOC~~ SOSY
6.0000 mg | PREFILLED_SYRINGE | Freq: Once | SUBCUTANEOUS | Status: AC
  Administered 2017-11-22: 6 mg via SUBCUTANEOUS

## 2017-11-22 MED ORDER — PEGFILGRASTIM-CBQV 6 MG/0.6ML ~~LOC~~ SOSY
PREFILLED_SYRINGE | SUBCUTANEOUS | Status: AC
  Filled 2017-11-22: qty 0.6

## 2017-11-24 ENCOUNTER — Encounter (HOSPITAL_COMMUNITY): Payer: Self-pay | Admitting: Nurse Practitioner

## 2017-11-24 ENCOUNTER — Emergency Department (HOSPITAL_COMMUNITY): Payer: BLUE CROSS/BLUE SHIELD

## 2017-11-24 ENCOUNTER — Emergency Department (HOSPITAL_COMMUNITY)
Admission: EM | Admit: 2017-11-24 | Discharge: 2017-11-24 | Disposition: A | Discharge status: Home / Self Care | Payer: BLUE CROSS/BLUE SHIELD | Attending: Emergency Medicine | Admitting: Emergency Medicine

## 2017-11-24 ENCOUNTER — Emergency Department (HOSPITAL_BASED_OUTPATIENT_CLINIC_OR_DEPARTMENT_OTHER): Payer: BLUE CROSS/BLUE SHIELD

## 2017-11-24 DIAGNOSIS — I10 Essential (primary) hypertension: Secondary | ICD-10-CM | POA: Diagnosis not present

## 2017-11-24 DIAGNOSIS — R600 Localized edema: Secondary | ICD-10-CM | POA: Insufficient documentation

## 2017-11-24 DIAGNOSIS — E119 Type 2 diabetes mellitus without complications: Secondary | ICD-10-CM | POA: Diagnosis not present

## 2017-11-24 DIAGNOSIS — R609 Edema, unspecified: Secondary | ICD-10-CM

## 2017-11-24 DIAGNOSIS — C3490 Malignant neoplasm of unspecified part of unspecified bronchus or lung: Secondary | ICD-10-CM | POA: Diagnosis not present

## 2017-11-24 DIAGNOSIS — C229 Malignant neoplasm of liver, not specified as primary or secondary: Secondary | ICD-10-CM | POA: Insufficient documentation

## 2017-11-24 DIAGNOSIS — Z79899 Other long term (current) drug therapy: Secondary | ICD-10-CM | POA: Diagnosis not present

## 2017-11-24 DIAGNOSIS — Z96651 Presence of right artificial knee joint: Secondary | ICD-10-CM | POA: Insufficient documentation

## 2017-11-24 DIAGNOSIS — F17210 Nicotine dependence, cigarettes, uncomplicated: Secondary | ICD-10-CM | POA: Diagnosis not present

## 2017-11-24 DIAGNOSIS — Z794 Long term (current) use of insulin: Secondary | ICD-10-CM | POA: Insufficient documentation

## 2017-11-24 LAB — CBC WITH DIFFERENTIAL/PLATELET
BASOS PCT: 0 %
Basophils Absolute: 0 10*3/uL (ref 0.0–0.1)
EOS PCT: 0 %
Eosinophils Absolute: 0 10*3/uL (ref 0.0–0.7)
HCT: 22.6 % — ABNORMAL LOW (ref 39.0–52.0)
Hemoglobin: 7.5 g/dL — ABNORMAL LOW (ref 13.0–17.0)
LYMPHS ABS: 0.8 10*3/uL (ref 0.7–4.0)
Lymphocytes Relative: 7 %
MCH: 33 pg (ref 26.0–34.0)
MCHC: 33.2 g/dL (ref 30.0–36.0)
MCV: 99.6 fL (ref 78.0–100.0)
MONOS PCT: 0 %
Monocytes Absolute: 0 10*3/uL — ABNORMAL LOW (ref 0.1–1.0)
Neutro Abs: 11.3 10*3/uL — ABNORMAL HIGH (ref 1.7–7.7)
Neutrophils Relative %: 93 %
Platelets: 75 10*3/uL — ABNORMAL LOW (ref 150–400)
RBC: 2.27 MIL/uL — ABNORMAL LOW (ref 4.22–5.81)
RDW: 18.3 % — ABNORMAL HIGH (ref 11.5–15.5)
WBC: 12.1 10*3/uL — AB (ref 4.0–10.5)

## 2017-11-24 LAB — COMPREHENSIVE METABOLIC PANEL
ALBUMIN: 2.3 g/dL — AB (ref 3.5–5.0)
ALT: 29 U/L (ref 0–44)
AST: 41 U/L (ref 15–41)
Alkaline Phosphatase: 122 U/L (ref 38–126)
Anion gap: 9 (ref 5–15)
BUN: 14 mg/dL (ref 8–23)
CHLORIDE: 104 mmol/L (ref 98–111)
CO2: 25 mmol/L (ref 22–32)
CREATININE: 0.79 mg/dL (ref 0.61–1.24)
Calcium: 8.1 mg/dL — ABNORMAL LOW (ref 8.9–10.3)
GFR calc Af Amer: >60 mL/min (ref >60)
Glucose, Bld: 151 mg/dL — ABNORMAL HIGH (ref 70–99)
POTASSIUM: 4.3 mmol/L (ref 3.5–5.1)
SODIUM: 138 mmol/L (ref 135–145)
Total Bilirubin: 2.9 mg/dL — ABNORMAL HIGH (ref 0.3–1.2)
Total Protein: 5.5 g/dL — ABNORMAL LOW (ref 6.5–8.1)

## 2017-11-24 NOTE — ED Provider Notes (Signed)
[facility] DEPT Provider Note   CSN: [id number] Arrival date & time: 11/24/17  1557     History   Chief Complaint Chief Complaint  Patient presents with  . Leg Swelling  . CA Pt    HPI Robert Compton is a 72 y.o. male.  HPI   Patient is here for evaluation of bilateral lower leg swelling, with blisters, which formed 2 days ago.  He saw his PCP for the swelling 3 days ago, at that time he was given "a cream," which he believes he may be allergic to.  He denies fever, chills, shortness of breath, cough, weakness or dizziness.  He is currently receiving chemotherapy for lung cancer.  He saw his oncologist about a week ago.  Screening chest CT was done following that visit.  He denies chronic lower extremity swelling.  There are no other known modifying factors.  Past Medical History:  Diagnosis Date  . Arthritis   . Cancer ([place])   . Diabetes mellitus without complication ([place])   . Hypertension     Patient Active Problem List   Diagnosis Date Noted  . Dehydration   . Pressure injury of skin 11/09/2017  . Hyperbilirubinemia   . Liver metastasis ([place])   . Neutropenic fever ([place])   . Pancytopenia ([place])   . Febrile neutropenia ([place]) 11/08/2017  . Severe sepsis ([place]) 11/08/2017  . Antineoplastic chemotherapy induced pancytopenia ([place]) 11/08/2017  . AKI (acute kidney injury) ([place]) 11/08/2017  . Encounter for antineoplastic chemotherapy   . 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 Liver masses   . Mass of upper lobe of right lung 09/28/2017  . Hyponatremia 09/27/2017  . Failure to thrive in adult 09/27/2017  . Metastatic cancer to liver ([place]) 09/27/2017  . DM (diabetes mellitus), type 2 ([place]) 09/27/2017  . Essential hypertension 09/27/2017    Past Surgical History:  Procedure Laterality Date  . JOINT REPLACEMENT Right    knee        Home Medications    Prior to Admission medications   Medication Sig  Start Date End Date Taking? Authorizing Provider  fish oil-omega-3 fatty acids 1000 MG capsule Take 1 g by mouth 2 (two) times daily.    [provider]  fluticasone (FLONASE) 50 MCG/ACT nasal spray Place 2 sprays into both nostrils daily as needed for allergies. 11/12/17   Eugenie Filler, MD  insulin aspart (NOVOLOG) 100 UNIT/ML injection CBG < 70: implement hypoglycemia protocol CBG 70 - 120: 0 units CBG 121 - 150: 2 units CBG 151 - 200: 3 units CBG 201 - 250: 5 units CBG 251 - 300: 8 units CBG 301 - 350: 11 units CBG 351 - 400: 15 units CBG > 400: call MD 10/08/17   Lavina Hamman, MD  levofloxacin (LEVAQUIN) 750 MG tablet Take 1 tablet (750 mg total) by mouth daily at 3 pm. 11/13/17   Eugenie Filler, MD  loratadine (CLARITIN) 10 MG tablet Take 1 tablet (10 mg total) by mouth daily. 11/13/17   Eugenie Filler, MD  Melatonin 3 MG CAPS Take 3 mg by mouth at bedtime.     [provider]  metFORMIN (GLUCOPHAGE) 1000 MG tablet Take 1,000 mg by mouth 2 (two) times daily with a meal.    [provider]  nicotine (NICODERM CQ - DOSED IN MG/24 HOURS) 21 mg/24hr patch Place 1 patch (21 mg total) onto the skin daily. 11/12/17   Grandville Silos,  Malachy Moan, MD  omeprazole (PRILOSEC) 20 MG capsule Take 20 mg by mouth daily.    [provider]  ondansetron (ZOFRAN-ODT) 8 MG disintegrating tablet Take 8 mg by mouth every 6 (six) hours as needed for nausea or vomiting. for nausea 09/25/17   [provider]  polyethylene glycol (MIRALAX / GLYCOLAX) packet Take 17 g by mouth daily. Patient not taking: Reported on 11/18/2017 10/09/17   Lavina Hamman, MD  Red Yeast Rice 600 MG TABS Take 1 tablet by mouth 2 (two) times daily.     [provider]  traMADol (ULTRAM) 50 MG tablet Take 1 tablet (50 mg total) by mouth every 6 (six) hours as needed for moderate pain. Patient taking differently: Take 50 mg by mouth every 4 (four) hours as needed for moderate pain.  10/08/17    Lavina Hamman, MD    Family History Family History  Problem Relation Age of Onset  . Diabetes Mother   . Colon cancer Father   . CAD Neg Hx     Social History Social History   Tobacco Use  . Smoking status: Current Every Day Smoker    Packs/day: 1.50    Years: 8.00    Pack years: 12.00    Types: Cigarettes  . Smokeless tobacco: Never Used  Substance Use Topics  . Alcohol use: No  . Drug use: No     Allergies   Amoxicillin-pot clavulanate and Nitrofurantoin   Review of Systems Review of Systems  All other systems reviewed and are negative.    Physical Exam Updated Vital Signs BP 118/63 (BP Location: Left Arm)   Pulse (!) 103   Temp 98.2 F (36.8 C) (Oral)   Resp (!) 21   SpO2 96%   Physical Exam  Constitutional: He is oriented to person, place, and time. He appears well-developed. No distress.  Elderly, obese  HENT:  Head: Normocephalic and atraumatic.  Right Ear: External ear normal.  Left Ear: External ear normal.  Eyes: Pupils are equal, round, and reactive to light. Conjunctivae and EOM are normal.  Neck: Normal range of motion and phonation normal. Neck supple.  Cardiovascular: Normal rate, regular rhythm and normal heart sounds.  Pulmonary/Chest: Effort normal and breath sounds normal. He exhibits no bony tenderness.  Abdominal: Soft. There is no tenderness.  Musculoskeletal: Normal range of motion. He exhibits edema (Bilateral legs, right greater than left.). He exhibits no tenderness or deformity.  Pitting edema lower legs bilaterally.  Right leg larger than left.  Negative Homans, no palpable popliteal mass, or tenderness few blisters, very superficial, water containing of the right lower leg and dorsum of the right foot.  No areas of bleeding, drainage or induration.  Mild erythema of the lower legs right greater than left.  The erythema has a nonspecific appearance.  Neurological: He is alert and oriented to person, place, and time. No cranial  nerve deficit or sensory deficit. He exhibits normal muscle tone. Coordination normal.  Skin: Skin is warm, dry and intact.  Psychiatric: He has a normal mood and affect. His behavior is normal. Judgment and thought content normal.  Nursing note and vitals reviewed.    ED Treatments / Results  Labs (all labs ordered are listed, but only abnormal results are displayed) Labs Reviewed  COMPREHENSIVE METABOLIC PANEL - Abnormal; Notable for the following components:      Result Value   Glucose, Bld 151 (*)    Calcium 8.1 (*)    Total Protein  5.5 (*)    Albumin 2.3 (*)    Total Bilirubin 2.9 (*)    All other components within normal limits  CBC WITH DIFFERENTIAL/PLATELET - Abnormal; Notable for the following components:   WBC 12.1 (*)    RBC 2.27 (*)    Hemoglobin 7.5 (*)    HCT 22.6 (*)    RDW 18.3 (*)    Platelets 75 (*)    All other components within normal limits    EKG None  Radiology Dg Chest 2 View  Result Date: 11/24/2017 CLINICAL DATA:  Leg swelling and blisters EXAM: CHEST - 2 VIEW COMPARISON:  November 08, 2017 FINDINGS: The heart size and mediastinal contours are within normal limits. There is no focal infiltrate or pulmonary edema. There are small bilateral posterior pleural effusions. The visualized skeletal structures are stable. IMPRESSION: Small bilateral posterior pleural effusions.  No focal pneumonia. Electronically Signed   By: Abelardo Diesel M.D.   On: 11/24/2017 17:30    Procedures Procedures (including critical care time)  Medications Ordered in ED Medications - No data to display   Initial Impression / Assessment and Plan / ED Course  I have reviewed the triage vital signs and the nursing notes.  Pertinent labs & imaging results that were available during my care of the patient were reviewed by me and considered in my medical decision making (see chart for details).  Clinical Course as of Nov 25 1934  Sun Nov 24, 2017  1919 Normal except glucose high,  calcium low, total protein low, albumin low, total bilirubin high  Comprehensive metabolic panel(!) [EW]  5361 Normal except white count high, hemoglobin low, platelets low  CBC with Differential(!) [EW]    Clinical Course User Index [EW] Daleen Bo, MD     Patient Vitals for the past 24 hrs:  BP Temp Temp src Pulse Resp SpO2  11/24/17 1921 118/63 98.2 F (36.8 C) Oral (!) 103 (!) 21 96 %  11/24/17 1610 110/74 98.2 F (36.8 C) Oral (!) 102 20 100 %    7:21 PM Reevaluation with update and discussion. After initial assessment and treatment, an updated evaluation reveals no change in clinical status.  Patient's sons are now here and state that he was put on Lasix 2 days ago 20 mg each day.  Previously he had been on 40 mg twice daily but stopped during the recent episode of sepsis with dehydration.  They have tried putting a compression stocking on the legs but "it did not fit."  The EMR indicates that the patient has a legal guardian.  Patient and his son state that he does not have a legal guardian.  Findings discussed and questions answered. Daleen Bo   Medical Decision Making: Leg swelling, consistent with peripheral edema associated with blistering, which does not appear infectious.  Patient had chemotherapy 2 days ago for lung cancer.  Trending of labs indicates mild lowering of red cells and platelets.  Also noted total bilirubin somewhat decreased from prior.  Venous Dopplers, done to evaluate for DVT are negative.  No evidence for cellulitis, metabolic instability or suggestion for congestive heart failure.  Nonspecific small pleural effusions are present.  CRITICAL CARE-no Performed by: Daleen Bo  Nursing Notes Reviewed/ Care Coordinated Applicable Imaging Reviewed Interpretation of Laboratory Data incorporated into ED treatment  The patient appears reasonably screened and/or stabilized for discharge and I doubt any other medical condition or other [facility] requiring  further screening, evaluation, or treatment in the ED at this  time prior to discharge.  Plan: Home Medications-continue current medications; Home Treatments-elevate legs 3 or 4 times a day for an hour, wound care for blisters, use compression stockings, when able; return here if the recommended treatment, does not improve the symptoms; Recommended follow up-PCP follow-up 1 week and oncology as scheduled   Final Clinical Impressions(s) / ED Diagnoses   Final diagnoses:  Peripheral edema    ED Discharge Orders    None       Daleen Bo, MD 11/24/17 1936

## 2017-11-24 NOTE — ED Notes (Signed)
Bed: YW31 Expected date:  Expected time:  Means of arrival:  Comments: Hold 23 Ca pt with leg swelling and blisters on legs

## 2017-11-24 NOTE — Progress Notes (Addendum)
VASCULAR LAB PRELIMINARY  PRELIMINARY  PRELIMINARY  PRELIMINARY  Bilateral lower extremity venous duplex completed.    Preliminary report:  There is no DVT or SVT noted in the bilateral lower extremities.    Gave results to Dr. Madalyn Rob, [facility], RVT 11/24/2017, 5:12 PM

## 2017-11-24 NOTE — ED Triage Notes (Signed)
Pt is presented from home, c/o bilateral leg swelling and blistering. Skin inspection reveals discoloration consistent with venus status, pt is c/o associated dull constant pain.

## 2017-11-24 NOTE — Discharge Instructions (Addendum)
The best treatment for the leg swelling and blisters are leg elevation, for at least an hour 3 or 4 times a day.  Keep the legs clean with soap and water daily, when the blisters are present.  Try not to pop the blisters.  Take all your medications as prescribed.

## 2017-11-25 ENCOUNTER — Inpatient Hospital Stay: Payer: BLUE CROSS/BLUE SHIELD

## 2017-11-25 DIAGNOSIS — C3411 Malignant neoplasm of upper lobe, right bronchus or lung: Secondary | ICD-10-CM

## 2017-11-26 ENCOUNTER — Encounter (HOSPITAL_COMMUNITY): Payer: Self-pay | Admitting: Family Medicine

## 2017-11-26 ENCOUNTER — Emergency Department (HOSPITAL_COMMUNITY): Payer: BLUE CROSS/BLUE SHIELD

## 2017-11-26 ENCOUNTER — Inpatient Hospital Stay (HOSPITAL_COMMUNITY)
Admission: EM | Admit: 2017-11-26 | Discharge: 2017-12-02 | DRG: 291 | Disposition: A | Discharge status: Home Health | Payer: BLUE CROSS/BLUE SHIELD | Attending: Internal Medicine | Admitting: Internal Medicine

## 2017-11-26 ENCOUNTER — Other Ambulatory Visit: Payer: Self-pay

## 2017-11-26 DIAGNOSIS — C3490 Malignant neoplasm of unspecified part of unspecified bronchus or lung: Secondary | ICD-10-CM | POA: Diagnosis present

## 2017-11-26 DIAGNOSIS — R600 Localized edema: Secondary | ICD-10-CM | POA: Diagnosis not present

## 2017-11-26 DIAGNOSIS — E119 Type 2 diabetes mellitus without complications: Secondary | ICD-10-CM | POA: Diagnosis present

## 2017-11-26 DIAGNOSIS — I110 Hypertensive heart disease with heart failure: Secondary | ICD-10-CM | POA: Diagnosis not present

## 2017-11-26 DIAGNOSIS — D61810 Antineoplastic chemotherapy induced pancytopenia: Secondary | ICD-10-CM | POA: Diagnosis present

## 2017-11-26 DIAGNOSIS — R0602 Shortness of breath: Secondary | ICD-10-CM

## 2017-11-26 DIAGNOSIS — D649 Anemia, unspecified: Secondary | ICD-10-CM | POA: Diagnosis present

## 2017-11-26 DIAGNOSIS — E43 Unspecified severe protein-calorie malnutrition: Secondary | ICD-10-CM | POA: Diagnosis present

## 2017-11-26 DIAGNOSIS — T451X5A Adverse effect of antineoplastic and immunosuppressive drugs, initial encounter: Secondary | ICD-10-CM | POA: Diagnosis present

## 2017-11-26 DIAGNOSIS — M1990 Unspecified osteoarthritis, unspecified site: Secondary | ICD-10-CM | POA: Diagnosis present

## 2017-11-26 DIAGNOSIS — I5033 Acute on chronic diastolic (congestive) heart failure: Secondary | ICD-10-CM

## 2017-11-26 DIAGNOSIS — Z87891 Personal history of nicotine dependence: Secondary | ICD-10-CM

## 2017-11-26 DIAGNOSIS — R000 Tachycardia, unspecified: Secondary | ICD-10-CM | POA: Diagnosis not present

## 2017-11-26 DIAGNOSIS — C787 Secondary malignant neoplasm of liver and intrahepatic bile duct: Secondary | ICD-10-CM | POA: Diagnosis present

## 2017-11-26 DIAGNOSIS — I878 Other specified disorders of veins: Secondary | ICD-10-CM | POA: Diagnosis present

## 2017-11-26 DIAGNOSIS — Z881 Allergy status to other antibiotic agents status: Secondary | ICD-10-CM

## 2017-11-26 DIAGNOSIS — E876 Hypokalemia: Secondary | ICD-10-CM | POA: Diagnosis present

## 2017-11-26 DIAGNOSIS — Z794 Long term (current) use of insulin: Secondary | ICD-10-CM

## 2017-11-26 DIAGNOSIS — C7951 Secondary malignant neoplasm of bone: Secondary | ICD-10-CM | POA: Diagnosis present

## 2017-11-26 DIAGNOSIS — Z79899 Other long term (current) drug therapy: Secondary | ICD-10-CM

## 2017-11-26 DIAGNOSIS — Z888 Allergy status to other drugs, medicaments and biological substances status: Secondary | ICD-10-CM

## 2017-11-26 DIAGNOSIS — Y92230 Patient room in hospital as the place of occurrence of the external cause: Secondary | ICD-10-CM | POA: Diagnosis present

## 2017-11-26 DIAGNOSIS — Z6841 Body Mass Index (BMI) 40.0 and over, adult: Secondary | ICD-10-CM

## 2017-11-26 DIAGNOSIS — K219 Gastro-esophageal reflux disease without esophagitis: Secondary | ICD-10-CM | POA: Diagnosis present

## 2017-11-26 DIAGNOSIS — Z79890 Hormone replacement therapy (postmenopausal): Secondary | ICD-10-CM

## 2017-11-26 DIAGNOSIS — Z833 Family history of diabetes mellitus: Secondary | ICD-10-CM

## 2017-11-26 DIAGNOSIS — Z800 Family history of malignant neoplasm of digestive organs: Secondary | ICD-10-CM

## 2017-11-26 DIAGNOSIS — Z79891 Long term (current) use of opiate analgesic: Secondary | ICD-10-CM

## 2017-11-26 DIAGNOSIS — D72829 Elevated white blood cell count, unspecified: Secondary | ICD-10-CM | POA: Diagnosis present

## 2017-11-26 LAB — CBC WITH DIFFERENTIAL/PLATELET
Basophils Absolute: 0 10*3/uL (ref 0.0–0.1)
Basophils Relative: 1 %
EOS ABS: 0 10*3/uL (ref 0.0–0.7)
EOS PCT: 0 %
HEMATOCRIT: 19.3 % — AB (ref 39.0–52.0)
Hemoglobin: 6.6 g/dL — CL (ref 13.0–17.0)
Lymphocytes Relative: 29 %
Lymphs Abs: 0.7 10*3/uL (ref 0.7–4.0)
MCH: 33 pg (ref 26.0–34.0)
MCHC: 34.2 g/dL (ref 30.0–36.0)
MCV: 96.5 fL (ref 78.0–100.0)
MONO ABS: 0.2 10*3/uL (ref 0.1–1.0)
Monocytes Relative: 7 %
Neutro Abs: 1.5 10*3/uL — ABNORMAL LOW (ref 1.7–7.7)
Neutrophils Relative %: 63 %
PLATELETS: 35 10*3/uL — AB (ref 150–400)
RBC: 2 MIL/uL — AB (ref 4.22–5.81)
RDW: 17.7 % — AB (ref 11.5–15.5)
WBC: 2.4 10*3/uL — ABNORMAL LOW (ref 4.0–10.5)

## 2017-11-26 LAB — COMPREHENSIVE METABOLIC PANEL
ALT: 33 U/L (ref 0–44)
AST: 47 U/L — ABNORMAL HIGH (ref 15–41)
Albumin: 2.4 g/dL — ABNORMAL LOW (ref 3.5–5.0)
Alkaline Phosphatase: 139 U/L — ABNORMAL HIGH (ref 38–126)
Anion gap: 11 (ref 5–15)
BUN: 14 mg/dL (ref 8–23)
CHLORIDE: 100 mmol/L (ref 98–111)
CO2: 25 mmol/L (ref 22–32)
CREATININE: 0.94 mg/dL (ref 0.61–1.24)
Calcium: 8.1 mg/dL — ABNORMAL LOW (ref 8.9–10.3)
GFR calc non Af Amer: >60 mL/min (ref >60)
Glucose, Bld: 137 mg/dL — ABNORMAL HIGH (ref 70–99)
Potassium: 4.4 mmol/L (ref 3.5–5.1)
Sodium: 136 mmol/L (ref 135–145)
Total Bilirubin: 2.8 mg/dL — ABNORMAL HIGH (ref 0.3–1.2)
Total Protein: 5.5 g/dL — ABNORMAL LOW (ref 6.5–8.1)

## 2017-11-26 LAB — I-STAT CG4 LACTIC ACID, ED
Lactic Acid, Venous: 3.41 mmol/L (ref 0.5–1.9)
Lactic Acid, Venous: 2.32 mmol/L (ref 0.5–1.9)

## 2017-11-26 LAB — PREPARE RBC (CROSSMATCH)

## 2017-11-26 LAB — GLUCOSE, CAPILLARY: Glucose-Capillary: 120 mg/dL — ABNORMAL HIGH (ref 70–99)

## 2017-11-26 LAB — TROPONIN I: Troponin I: 0.03 ng/mL (ref <0.03)

## 2017-11-26 LAB — I-STAT TROPONIN, ED: TROPONIN I, POC: 0.03 ng/mL (ref 0.00–0.08)

## 2017-11-26 LAB — BRAIN NATRIURETIC PEPTIDE: B NATRIURETIC PEPTIDE 5: 116.9 pg/mL — AB (ref 0.0–100.0)

## 2017-11-26 MED ORDER — ENSURE ENLIVE PO LIQD
237.0000 mL | Freq: Two times a day (BID) | ORAL | Status: DC
  Administered 2017-11-27 – 2017-11-30 (×4): 237 mL via ORAL

## 2017-11-26 MED ORDER — SODIUM CHLORIDE 0.9 % IV SOLN: 10.0000 mL/h | Freq: Once | INTRAVENOUS | Status: DC

## 2017-11-26 MED ORDER — SODIUM CHLORIDE 0.9% FLUSH
3.0000 mL | Freq: Two times a day (BID) | INTRAVENOUS | Status: DC
  Administered 2017-11-27 – 2017-12-02 (×11): 3 mL via INTRAVENOUS

## 2017-11-26 MED ORDER — FUROSEMIDE 20 MG PO TABS: 20.0000 mg | ORAL_TABLET | Freq: Every day | ORAL | Status: DC

## 2017-11-26 MED ORDER — METFORMIN HCL 500 MG PO TABS: 1000.0000 mg | ORAL_TABLET | Freq: Two times a day (BID) | ORAL | Status: DC

## 2017-11-26 MED ORDER — ACETAMINOPHEN 325 MG PO TABS
650.0000 mg | ORAL_TABLET | Freq: Four times a day (QID) | ORAL | Status: DC | PRN
  Administered 2017-11-28 – 2017-12-02 (×3): 650 mg via ORAL
  Filled 2017-11-26 (×3): qty 2

## 2017-11-26 MED ORDER — IOPAMIDOL (ISOVUE-370) INJECTION 76%
INTRAVENOUS | Status: AC
  Filled 2017-11-26: qty 100

## 2017-11-26 MED ORDER — INSULIN ASPART 100 UNIT/ML ~~LOC~~ SOLN
0.0000 [IU] | Freq: Every day | SUBCUTANEOUS | Status: DC
  Administered 2017-12-01: 2 [IU] via SUBCUTANEOUS

## 2017-11-26 MED ORDER — ACETAMINOPHEN 650 MG RE SUPP: 650.0000 mg | Freq: Four times a day (QID) | RECTAL | Status: DC | PRN

## 2017-11-26 MED ORDER — INSULIN ASPART 100 UNIT/ML ~~LOC~~ SOLN
0.0000 [IU] | Freq: Three times a day (TID) | SUBCUTANEOUS | Status: DC
  Administered 2017-11-27 – 2017-11-29 (×7): 1 [IU] via SUBCUTANEOUS
  Administered 2017-11-30: 2 [IU] via SUBCUTANEOUS
  Administered 2017-11-30: 1 [IU] via SUBCUTANEOUS
  Administered 2017-11-30 – 2017-12-01 (×3): 2 [IU] via SUBCUTANEOUS
  Administered 2017-12-02: 3 [IU] via SUBCUTANEOUS

## 2017-11-26 MED ORDER — PANTOPRAZOLE SODIUM 40 MG PO TBEC
40.0000 mg | DELAYED_RELEASE_TABLET | Freq: Every day | ORAL | Status: DC
  Administered 2017-11-27 – 2017-12-02 (×6): 40 mg via ORAL
  Filled 2017-11-26 (×6): qty 1

## 2017-11-26 MED ORDER — SODIUM CHLORIDE 0.9% IV SOLUTION: Freq: Once | INTRAVENOUS | Status: DC

## 2017-11-26 MED ORDER — LORATADINE 10 MG PO TABS
10.0000 mg | ORAL_TABLET | Freq: Every day | ORAL | Status: DC
  Administered 2017-11-27 – 2017-12-02 (×6): 10 mg via ORAL
  Filled 2017-11-26 (×6): qty 1

## 2017-11-26 MED ORDER — SODIUM CHLORIDE 0.9% FLUSH: 3.0000 mL | INTRAVENOUS | Status: DC | PRN

## 2017-11-26 MED ORDER — PRO-STAT SUGAR FREE PO LIQD
30.0000 mL | Freq: Two times a day (BID) | ORAL | Status: DC
  Administered 2017-11-27 – 2017-12-01 (×7): 30 mL via ORAL
  Filled 2017-11-26 (×6): qty 30

## 2017-11-26 MED ORDER — METFORMIN HCL 500 MG PO TABS
1000.0000 mg | ORAL_TABLET | Freq: Two times a day (BID) | ORAL | Status: DC
  Administered 2017-11-27: 1000 mg via ORAL
  Filled 2017-11-26: qty 2

## 2017-11-26 MED ORDER — IOPAMIDOL (ISOVUE-370) INJECTION 76%
100.0000 mL | Freq: Once | INTRAVENOUS | Status: AC | PRN
  Administered 2017-11-26: 100 mL via INTRAVENOUS

## 2017-11-26 MED ORDER — SODIUM CHLORIDE 0.9 % IV SOLN: 250.0000 mL | INTRAVENOUS | Status: DC | PRN

## 2017-11-26 MED ORDER — NICOTINE 21 MG/24HR TD PT24
21.0000 mg | MEDICATED_PATCH | Freq: Every day | TRANSDERMAL | Status: DC
  Administered 2017-11-27 – 2017-12-02 (×6): 21 mg via TRANSDERMAL
  Filled 2017-11-26 (×6): qty 1

## 2017-11-26 NOTE — ED Notes (Signed)
Dr. Maudie Mercury, admitting is at the bedside and will transport patient to the floor after he completes his assessment. Receiving RN made aware that the consent form for the blood has been signed and the blood is ready in the blood bank when they are ready.

## 2017-11-26 NOTE — ED Notes (Signed)
Pt updated on plan of care

## 2017-11-26 NOTE — Progress Notes (Signed)
ED CM reviewed CM consult. Patient is being admitted. Unit CM to follow for discharge needs. Venita Sheffield RN CCM

## 2017-11-26 NOTE — ED Notes (Signed)
Bed: EL95 Expected date:  Expected time:  Means of arrival:  Comments: Res B

## 2017-11-26 NOTE — ED Provider Notes (Signed)
[facility] DEPT Provider Note   CSN: [id number] Arrival date & time: 11/26/17  1620     History   Chief Complaint Chief Complaint  Patient presents with  . Shortness of Breath    HPI Robert Compton is a 72 y.o. male.   The history is provided by the patient.   Shortness of Breath   This is a recurrent problem. The problem occurs continuously.The current episode started more than 2 days ago. The problem has been gradually worsening. Associated symptoms include PND, orthopnea and leg swelling. Pertinent negatives include no fever, no sore throat, no ear pain, no neck pain, no cough, no sputum production, no wheezing, no chest pain, no syncope, no vomiting, no abdominal pain, no rash and no leg pain. It is unknown what precipitated the problem. Risk factors: Hx of lung cancer on chemotherapy.  He has tried nothing for the symptoms. The treatment provided no relief. He has had prior hospitalizations. Associated medical issues do not include heart failure or recent surgery. Associated medical issues comments: Lung cancer.    Past Medical History:  Diagnosis Date  . Arthritis   . Cancer ([place])   . Diabetes mellitus without complication ([place])   . Hypertension     Patient Active Problem List   Diagnosis Date Noted  . Tachycardia 11/26/2017  . Symptomatic anemia 11/26/2017  . Leg edema, right 11/26/2017  . Dehydration   . Pressure injury of skin 11/09/2017  . Hyperbilirubinemia   . Liver metastasis ([place])   . Neutropenic fever ([place])   . Pancytopenia ([place])   . Febrile neutropenia ([place]) 11/08/2017  . Severe sepsis ([place]) 11/08/2017  . Antineoplastic chemotherapy induced pancytopenia ([place]) 11/08/2017  . AKI (acute kidney injury) ([place]) 11/08/2017  . Encounter for antineoplastic chemotherapy   . Extensive stage primary small cell carcinoma of lung ([place]) 10/03/2017  . Liver masses   . Mass of upper lobe of right lung 09/28/2017  . Hyponatremia  09/27/2017  . Failure to thrive in adult 09/27/2017  . Metastatic cancer to liver ([place]) 09/27/2017  . DM (diabetes mellitus), type 2 ([place]) 09/27/2017  . Essential hypertension 09/27/2017    Past Surgical History:  Procedure Laterality Date  . HERNIA REPAIR    . JOINT REPLACEMENT Right    knee  . LIVER BIOPSY          Home Medications    Prior to Admission medications   Medication Sig Start Date End Date Taking? Authorizing Provider  CINNAMON PO Take 600 mg by mouth 2 (two) times daily.   Yes [provider]  fish oil-omega-3 fatty acids 1000 MG capsule Take 1 g by mouth 2 (two) times daily.   Yes [provider]  furosemide (LASIX) 20 MG tablet Take 20 mg by mouth daily. 11/22/17  Yes [provider]  insulin aspart (NOVOLOG) 100 UNIT/ML injection CBG < 70: implement hypoglycemia protocol CBG 70 - 120: 0 units CBG 121 - 150: 2 units CBG 151 - 200: 3 units CBG 201 - 250: 5 units CBG 251 - 300: 8 units CBG 301 - 350: 11 units CBG 351 - 400: 15 units CBG > 400: call MD 10/08/17  Yes Lavina Hamman, MD  loratadine (CLARITIN) 10 MG tablet Take 1 tablet (10 mg total) by mouth daily. 11/13/17  Yes Eugenie Filler, MD  metFORMIN (GLUCOPHAGE) 1000 MG tablet Take 1,000 mg by mouth 2 (two) times daily with a meal.   Yes [provider]  nicotine (NICODERM CQ - DOSED IN MG/24 HOURS) 21 mg/24hr patch Place 1 patch (21 mg total) onto the skin daily. 11/12/17  Yes Eugenie Filler, MD  omeprazole (PRILOSEC) 20 MG capsule Take 20 mg by mouth daily.   Yes [provider]  Red Yeast Rice 600 MG TABS Take 1 tablet by mouth 2 (two) times daily.    Yes [provider]  traMADol (ULTRAM) 50 MG tablet Take 1 tablet (50 mg total) by mouth every 6 (six) hours as needed for moderate pain. Patient taking differently: Take 50 mg by mouth every 4 (four) hours as needed for moderate pain.  10/08/17  Yes Lavina Hamman, MD  triamcinolone cream (KENALOG) 0.1 %  Apply 1 application topically daily as needed for wound care. 11/22/17  Yes [provider]  fluticasone (FLONASE) 50 MCG/ACT nasal spray Place 2 sprays into both nostrils daily as needed for allergies. Patient not taking: Reported on 11/26/2017 11/12/17   Eugenie Filler, MD  levofloxacin (LEVAQUIN) 750 MG tablet Take 1 tablet (750 mg total) by mouth daily at 3 pm. Patient not taking: Reported on 11/26/2017 11/13/17   Eugenie Filler, MD  polyethylene glycol [facility] / Floria Raveling) packet Take 17 g by mouth daily. Patient not taking: Reported on 11/18/2017 10/09/17   Lavina Hamman, MD    Family History Family History  Problem Relation Age of Onset  . Diabetes Mother   . Dementia Mother   . Colon cancer Father   . CAD Neg Hx     Social History Social History   Tobacco Use  . Smoking status: Former Smoker    Types: Cigarettes    Last attempt to quit: 09/20/2017    Years since quitting: 0.1  . Smokeless tobacco: Never Used  Substance Use Topics  . Alcohol use: No  . Drug use: No     Allergies   Amoxicillin-pot clavulanate and Nitrofurantoin   Review of Systems Review of Systems  Constitutional: Negative for chills and fever.  HENT: Negative for ear pain and sore throat.   Eyes: Negative for pain and visual disturbance.  Respiratory: Positive for shortness of breath. Negative for cough, sputum production and wheezing.   Cardiovascular: Positive for orthopnea, leg swelling and PND. Negative for chest pain, palpitations and syncope.  Gastrointestinal: Negative for abdominal pain and vomiting.  Genitourinary: Negative for dysuria and hematuria.  Musculoskeletal: Negative for arthralgias, back pain and neck pain.  Skin: Negative for color change and rash.  Neurological: Negative for seizures and syncope.  All other systems reviewed and are negative.    Physical Exam Updated Vital Signs  ED Triage Vitals  Enc Vitals Group     BP 11/26/17 1635 112/69     Pulse  Rate 11/26/17 1635 94     Resp 11/26/17 1635 17     Temp 11/26/17 1635 98 F (36.7 C)     Temp Source 11/26/17 1635 Oral     SpO2 11/26/17 1635 99 %     Weight 11/26/17 1637 (!) 308 lb (139.7 kg)     Height 11/26/17 1637 6\' 3"  (1.905 m)     Head Circumference --      Peak Flow --      Pain Score 11/26/17 1636 5     Pain Loc --      Pain Edu? --      Excl. in [place]? --     Physical Exam  Constitutional: He appears well-developed and well-nourished.  HENT:  Head: Normocephalic and atraumatic.  Eyes: Pupils are equal, round, and reactive to light. Conjunctivae and EOM are normal.  Scleral icterus  Neck: Normal range of motion. Neck supple.  Cardiovascular: Normal rate, regular rhythm, normal heart sounds and intact distal pulses.  No murmur heard. Pulmonary/Chest: Effort normal. No respiratory distress. He has decreased breath sounds. He has no wheezes. He has no rhonchi. He has rales.  Abdominal: Soft. He exhibits distension. There is no tenderness.  Musculoskeletal: Normal range of motion.       Right lower leg: He exhibits edema.       Left lower leg: He exhibits edema.  Neurological: He is alert.  Skin: Skin is warm and dry.  Chronic venous stasis changes to legs  Psychiatric: He has a normal mood and affect.  Nursing note and vitals reviewed.    ED Treatments / Results  Labs (all labs ordered are listed, but only abnormal results are displayed) Labs Reviewed  COMPREHENSIVE METABOLIC PANEL - Abnormal; Notable for the following components:      Result Value   Glucose, Bld 137 (*)    Calcium 8.1 (*)    Total Protein 5.5 (*)    Albumin 2.4 (*)    AST 47 (*)    Alkaline Phosphatase 139 (*)    Total Bilirubin 2.8 (*)    All other components within normal limits  CBC WITH DIFFERENTIAL/PLATELET - Abnormal; Notable for the following components:   WBC 2.4 (*)    RBC 2.00 (*)    Hemoglobin 6.6 (*)    HCT 19.3 (*)    RDW 17.7 (*)    Platelets 35 (*)    Neutro Abs 1.5 (*)     All other components within normal limits  BRAIN NATRIURETIC PEPTIDE - Abnormal; Notable for the following components:   B Natriuretic Peptide 116.9 (*)    All other components within normal limits  I-STAT CG4 LACTIC ACID, ED - Abnormal; Notable for the following components:   Lactic Acid, Venous 3.41 (*)    All other components within normal limits  I-STAT CG4 LACTIC ACID, ED - Abnormal; Notable for the following components:   Lactic Acid, Venous 2.32 (*)    All other components within normal limits  CULTURE, BLOOD (ROUTINE X 2)  CULTURE, BLOOD (ROUTINE X 2)  URINALYSIS, ROUTINE W REFLEX MICROSCOPIC  I-STAT TROPONIN, ED  PREPARE RBC (CROSSMATCH)  TYPE AND SCREEN    EKG None  Radiology Ct Angio Chest Pe W Or Wo Contrast  Result Date: 11/26/2017 CLINICAL DATA:  Shortness of breath. History of small cell lung cancer, currently on chemotherapy. EXAM: CT ANGIOGRAPHY CHEST WITH CONTRAST TECHNIQUE: Multidetector CT imaging of the chest was performed using the standard protocol during bolus administration of intravenous contrast. Multiplanar CT image reconstructions and MIPs were obtained to evaluate the vascular anatomy. CONTRAST:  164mL ISOVUE-370 IOPAMIDOL (ISOVUE-370) INJECTION 76% COMPARISON:  CT chest dated November 19, 2017. FINDINGS: Cardiovascular: Suboptimal opacification of the pulmonary arteries due to bolus timing. No central or lobar pulmonary embolism. The segmental pulmonary arteries are not well evaluated. Normal heart size. No pericardial effusion. Normal caliber thoracic aorta. Coronary, aortic arch, and branch vessel atherosclerotic vascular disease. Mediastinum/Nodes: Unchanged enlarged 1.2 cm and 2.7 cm right paratracheal lymph nodes. Unchanged 9 mm right periaortic lymph node. No hilar or axillary lymphadenopathy. The thyroid gland, trachea, and esophagus are unremarkable. Lungs/Pleura: Unchanged 8 mm irregular nodule within the right lung apex. Increased linear atelectasis  in the right  lung apex. One 6 mm nodule along the minor fissure is unchanged in size. The other previously seen nodule along the minor fissure is no longer identified. No new pulmonary nodule. Slight interval increase in size of small right pleural effusion with adjacent right lower lobe subsegmental atelectasis. Unchanged small left pleural effusion. No consolidation or pneumothorax. Upper Abdomen: Nodular liver contour suggestive of cirrhosis. Hypodense metastases within the posterior right hepatic lobe are not as well evaluated on today's study, but appear grossly unchanged. Small ascites, unchanged. Musculoskeletal: Unchanged subcentimeter lucent lesions in the T5 and T9 vertebral bodies. No acute osseous finding. Review of the MIP images confirms the above findings. IMPRESSION: 1. Suboptimal opacification of the pulmonary arteries. No central or lobar pulmonary embolism. 2. Slight interval increase in size of small right pleural effusion. Unchanged small left pleural effusion. 3. Unchanged right apical lung cancer. Stable mediastinal adenopathy and hepatic metastases. 4. Nodular liver contour, suggestive of cirrhosis. Unchanged small ascites. 5.  Aortic atherosclerosis (ICD10-I70.0). Electronically Signed   By: Titus Dubin M.D.   On: 11/26/2017 18:55   Dg Chest Port 1 View  Result Date: 11/26/2017 CLINICAL DATA:  Short of breath EXAM: PORTABLE CHEST 1 VIEW COMPARISON:  11/24/2017, 11/08/2017, CT chest 11/19/2017, PET-CT 10/21/2017 FINDINGS: Hazy bibasilar opacity. Stable cardiomediastinal silhouette with aortic atherosclerosis. No pneumothorax. CT demonstrated right apical lung nodule not well seen radiographically. IMPRESSION: Hazy bibasilar opacity, may reflect atelectasis or infiltrate. Electronically Signed   By: Donavan Foil M.D.   On: 11/26/2017 17:02    Procedures .Critical Care Performed by: Lennice Sites, DO Authorized by: Lennice Sites, DO   Critical care provider statement:     Critical care time (minutes):  30   Critical care was necessary to treat or prevent imminent or life-threatening deterioration of the following conditions: anemia, Hemoglobin 6.6, patient requiring blood transfusion    Critical care was time spent personally by me on the following activities:  Development of treatment plan with patient or surrogate, discussions with consultants, examination of patient, obtaining history from patient or surrogate, ordering and performing treatments and interventions, ordering and review of laboratory studies, ordering and review of radiographic studies, re-evaluation of patient's condition and review of old charts   (including critical care time)  Medications Ordered in ED Medications  0.9 %  sodium chloride infusion (has no administration in time range)  iopamidol (ISOVUE-370) 76 % injection (has no administration in time range)  furosemide (LASIX) tablet 20 mg (has no administration in time range)  nicotine (NICODERM CQ - dosed in mg/24 hours) patch 21 mg (has no administration in time range)  metFORMIN (GLUCOPHAGE) tablet 1,000 mg (has no administration in time range)  pantoprazole (PROTONIX) EC tablet 40 mg (has no administration in time range)  loratadine (CLARITIN) tablet 10 mg (has no administration in time range)  iopamidol (ISOVUE-370) 76 % injection 100 mL (100 mLs Intravenous Contrast Given 11/26/17 1814)    Initial Impression / Assessment and Plan / ED Course  I have reviewed the triage vital signs and the nursing notes.  Pertinent labs & imaging results that were available during my care of the patient were reviewed by me and considered in my medical decision making (see chart for details).     Letcher Schweikert Is a 72 year old male with history of lung cancer with metastasis to the liver currently on chemotherapy, diabetes, hypertension who presents to the ED with shortness of breath.  Patient with overall unremarkable vitals upon arrival.  No  fever.  Patient with increasing feeling of shortness of breath over the last several days.  Was seen in the ED over the weekend and had overall negative work-up.  Patient states increasing shortness of breath with walking, lying flat.  Patient also states increased swelling in the legs but had a negative DVT study recently.  Patient denies any chest pain, abdominal pain.  Patient is overall chronically ill-appearing.  Has jaundice which is baseline.  No abdominal tenderness on exam. Chronic venous stasis in the lower extremities with pitting edema bilaterally.  Patient has diminished breath sounds throughout.  Differential is wide including PE, anemia, pneumonia.  Patient had EKG performed that showed no ischemic changes.  Troponin within normal limits.  Doubt ACS.  Patient had lactic acid drawn in the first look process that was elevated to 3.4 however patient with no fever, no urinary symptoms, no cough, no sputum production and likely believe elevated lactic acid is secondary to liver disease.  Blood cultures however were drawn. Will hold antibiotics at this time as no obvious source of infection and believe that this lactic acidosis likely secondary to chronic illness and from liver disease.  Hemoglobin critical at 6.6.  Patient with no gross melena or hematochezia on exam and likely secondary to chemotherapy.  Patient was typed and screened and given 1 unit of packed red blood cells.  Hemodynamically stable.  Patient with otherwise liver enzymes and gallbladder enzymes at baseline.  Electrolytes within normal limits.  Chest x-ray showed no obvious pneumonia, pneumothorax, significant pleural effusions.  BNP mildly elevated.  Patient with PE study that showed no obvious blood clot.  Patient with mild increase in right-sided pleural effusion.  Overall, patient likely with symptoms secondary to anemia and patient to be admitted to medicine for symptomatic anemia.  No infectious findings at this time.  No concern  for sepsis.  Repeat lactic acid is also improved despite any treatment.  Patient taken over by medicine with urinalysis pending. Hemodynamically stable throughout my care.  Final Clinical Impressions(s) / ED Diagnoses   Final diagnoses:  Symptomatic anemia  SOB (shortness of breath)    ED Discharge Orders    None      Lennice Sites, DO 11/26/17 2037

## 2017-11-26 NOTE — ED Notes (Signed)
Date and time results received: 11/26/17 17:17  Test: Hgb  Critical Value: 6.6 g/dL   Name of Provider Notified: Dr. Marlana Latus and Donella Stade, RN.   Orders Received? Or Actions Taken?: Will continue to monitor and await for new orders.

## 2017-11-26 NOTE — ED Notes (Signed)
ED TO INPATIENT HANDOFF REPORT  Name/Age/Gender Robert Compton 72 y.o. male  Code Status Code Status History    Date Active Date Inactive Code Status Order ID Comments User Context   11/08/2017 2005 11/12/2017 1833 Full Code [id number]  [name], DO ED   09/27/2017 2346 10/08/2017 2258 Full Code [id number]  [name], MD Inpatient      Home/SNF/Other Home  Chief Complaint shortness of breath  Level of Care/Admitting Diagnosis ED Disposition    ED Disposition Condition [facility] [facility] Area: [facility] [[id number]]  Level of Care: Telemetry [5]  Admit to tele based on following criteria: Other see comments  Comments: tachycardia  Diagnosis: Symptomatic anemia [2130865]  Admitting Physician: [name] [[id number]]  Attending Physician: [name] [[id number]]  PT Class (Do Not Modify): Observation [104]  PT Acc Code (Do Not Modify): Observation [10022]       Medical History Past Medical History:  Diagnosis Date  . Arthritis   . Cancer ([place])   . Diabetes mellitus without complication ([place])   . Hypertension     Allergies Allergies  Allergen Reactions  . Amoxicillin-Pot Clavulanate Anaphylaxis    Has patient had a PCN reaction causing immediate rash, facial/tongue/throat swelling, SOB or lightheadedness with hypotension: Yes Has patient had a PCN reaction causing severe rash involving mucus membranes or skin necrosis: Yes Has patient had a PCN reaction that required hospitalization: Yes Has patient had a PCN reaction occurring within the last 10 years: Yes If all of the above answers are "NO", then may proceed with Cephalosporin use.   . Nitrofurantoin Anaphylaxis    IV Location/Drains/Wounds Patient Lines/Drains/Airways Status   Active Line/Drains/Airways    Name:   Placement date:   Placement time:   Site:   Days:   Peripheral IV 11/26/17 Right Antecubital   11/26/17    1800    Antecubital   less than 1   External Urinary Catheter    11/08/17    2300    -   18   Incision (Closed) 09/30/17 Abdomen Right   09/30/17    2043     57   Pressure Injury 11/08/17 Stage II -  Partial thickness loss of dermis presenting as a shallow open ulcer with a red, pink wound bed without slough. two small .5 cm healing stage 2 on each side inner buttock   11/08/17    2200     18   Wound / Incision (Open or Dehisced) 10/08/17 Other (Comment) Buttocks Medial split in skin inn the crease of the buttocks   10/08/17    1010    Buttocks   49          Labs/Imaging Results for orders placed or performed during the hospital encounter of 11/26/17 (from the past 48 hour(s))  Comprehensive metabolic panel     Status: Abnormal   Collection Time: 11/26/17  4:40 PM  Result Value Ref Range   Sodium 136 135 - 145 mmol/L   Potassium 4.4 3.5 - 5.1 mmol/L   Chloride 100 98 - 111 mmol/L   CO2 25 22 - 32 mmol/L   Glucose, Bld 137 (H) 70 - 99 mg/dL   BUN 14 8 - 23 mg/dL   Creatinine, Ser 0.94 0.61 - 1.24 mg/dL   Calcium 8.1 (L) 8.9 - 10.3 mg/dL   Total Protein 5.5 (L) 6.5 - 8.1 g/dL   Albumin 2.4 (L) 3.5 - 5.0 g/dL   AST 47 (  H) 15 - 41 U/L   ALT 33 0 - 44 U/L   Alkaline Phosphatase 139 (H) 38 - 126 U/L   Total Bilirubin 2.8 (H) 0.3 - 1.2 mg/dL   GFR calc non Af Amer >60 >60 mL/min   GFR calc Af Amer >60 >60 mL/min    Comment: (NOTE) The eGFR has been calculated using the CKD EPI equation. This calculation has not been validated in all clinical situations. eGFR's persistently <60 mL/min signify possible Chronic Kidney Disease.    Anion gap 11 5 - 15    Comment: Performed at [facility], [place] [address] [place], [place] [address]  CBC WITH DIFFERENTIAL     Status: Abnormal   Collection Time: 11/26/17  4:40 PM  Result Value Ref Range   WBC 2.4 (L) 4.0 - 10.5 K/uL   RBC 2.00 (L) 4.22 - 5.81 MIL/uL   Hemoglobin 6.6 (LL) 13.0 - 17.0 g/dL    Comment: REPEATED TO VERIFY CRITICAL RESULT CALLED TO, READ BACK BY AND VERIFIED  WITH: J.INMAN AT 6286 ON 11/26/17 BY N.THOMPSON    HCT 19.3 (L) 39.0 - 52.0 %   MCV 96.5 78.0 - 100.0 fL   MCH 33.0 26.0 - 34.0 pg   MCHC 34.2 30.0 - 36.0 g/dL   RDW 17.7 (H) 11.5 - 15.5 %   Platelets 35 (L) 150 - 400 K/uL    Comment: REPEATED TO VERIFY SPECIMEN CHECKED FOR CLOTS PLATELET COUNT CONFIRMED BY SMEAR    Neutrophils Relative % 63 %   Lymphocytes Relative 29 %   Monocytes Relative 7 %   Eosinophils Relative 0 %   Basophils Relative 1 %   Neutro Abs 1.5 (L) 1.7 - 7.7 K/uL   Lymphs Abs 0.7 0.7 - 4.0 K/uL   Monocytes Absolute 0.2 0.1 - 1.0 K/uL   Eosinophils Absolute 0.0 0.0 - 0.7 K/uL   Basophils Absolute 0.0 0.0 - 0.1 K/uL   RBC Morphology TEARDROP CELLS    WBC Morphology TOXIC GRANULATION     Comment: DOHLE BODIES Performed at [facility], [place] [address] [place], [place] [address]   Brain natriuretic peptide     Status: Abnormal   Collection Time: 11/26/17  4:40 PM  Result Value Ref Range   B Natriuretic Peptide 116.9 (H) 0.0 - 100.0 pg/mL    Comment: Performed at [facility], [place] [address] [place], [place] [address]  I-stat troponin, ED  (not at [facility] [place], [facility])     Status: None   Collection Time: 11/26/17  4:45 PM  Result Value Ref Range   Troponin i, poc 0.03 0.00 - 0.08 ng/mL   Comment 3            Comment: Due to the release kinetics of cTnI, a negative result within the first hours of the onset of symptoms does not rule out myocardial infarction with certainty. If myocardial infarction is still suspected, repeat the test at appropriate intervals.   I-Stat CG4 Lactic Acid, ED     Status: Abnormal   Collection Time: 11/26/17  4:47 PM  Result Value Ref Range   Lactic Acid, Venous 3.41 (HH) 0.5 - 1.9 mmol/L   Comment NOTIFIED PHYSICIAN   Prepare RBC     Status: None   Collection Time: 11/26/17  6:37 PM  Result Value Ref Range   Order Confirmation      ORDER PROCESSED BY BLOOD BANK Performed at [facility]  [address] [place], [place] [address]   Type and screen [place]     Status: None (Preliminary result)   Collection Time: 11/26/17  6:37 PM  Result Value Ref Range   ABO/RH(D) A POS    Antibody Screen PENDING    Sample Expiration      11/29/2017 Performed at [facility], [place] [address], [place], [place] [address]   I-Stat CG4 Lactic Acid, ED     Status: Abnormal   Collection Time: 11/26/17  6:42 PM  Result Value Ref Range   Lactic Acid, Venous 2.32 (HH) 0.5 - 1.9 mmol/L   Comment NOTIFIED PHYSICIAN    Ct Angio Chest Pe W Or Wo Contrast  Result Date: 11/26/2017 CLINICAL DATA:  Shortness of breath. History of small cell lung cancer, currently on chemotherapy. EXAM: CT ANGIOGRAPHY CHEST WITH CONTRAST TECHNIQUE: Multidetector CT imaging of the chest was performed using the standard protocol during bolus administration of intravenous contrast. Multiplanar CT image reconstructions and MIPs were obtained to evaluate the vascular anatomy. CONTRAST:  190m ISOVUE-370 IOPAMIDOL (ISOVUE-370) INJECTION 76% COMPARISON:  CT chest dated November 19, 2017. FINDINGS: Cardiovascular: Suboptimal opacification of the pulmonary arteries due to bolus timing. No central or lobar pulmonary embolism. The segmental pulmonary arteries are not well evaluated. Normal heart size. No pericardial effusion. Normal caliber thoracic aorta. Coronary, aortic arch, and branch vessel atherosclerotic vascular disease. Mediastinum/Nodes: Unchanged enlarged 1.2 cm and 2.7 cm right paratracheal lymph nodes. Unchanged 9 mm right periaortic lymph node. No hilar or axillary lymphadenopathy. The thyroid gland, trachea, and esophagus are unremarkable. Lungs/Pleura: Unchanged 8 mm irregular nodule within the right lung apex. Increased linear atelectasis in the right lung apex. One 6 mm nodule along the minor fissure is unchanged in size. The other previously seen nodule along the  minor fissure is no longer identified. No new pulmonary nodule. Slight interval increase in size of small right pleural effusion with adjacent right lower lobe subsegmental atelectasis. Unchanged small left pleural effusion. No consolidation or pneumothorax. Upper Abdomen: Nodular liver contour suggestive of cirrhosis. Hypodense metastases within the posterior right hepatic lobe are not as well evaluated on today's study, but appear grossly unchanged. Small ascites, unchanged. Musculoskeletal: Unchanged subcentimeter lucent lesions in the T5 and T9 vertebral bodies. No acute osseous finding. Review of the MIP images confirms the above findings. IMPRESSION: 1. Suboptimal opacification of the pulmonary arteries. No central or lobar pulmonary embolism. 2. Slight interval increase in size of small right pleural effusion. Unchanged small left pleural effusion. 3. Unchanged right apical lung cancer. Stable mediastinal adenopathy and hepatic metastases. 4. Nodular liver contour, suggestive of cirrhosis. Unchanged small ascites. 5.  Aortic atherosclerosis (ICD10-I70.0). Electronically Signed   By: WTitus DubinM.D.   On: 11/26/2017 18:55   Dg Chest Port 1 View  Result Date: 11/26/2017 CLINICAL DATA:  Short of breath EXAM: PORTABLE CHEST 1 VIEW COMPARISON:  11/24/2017, 11/08/2017, CT chest 11/19/2017, PET-CT 10/21/2017 FINDINGS: Hazy bibasilar opacity. Stable cardiomediastinal silhouette with aortic atherosclerosis. No pneumothorax. CT demonstrated right apical lung nodule not well seen radiographically. IMPRESSION: Hazy bibasilar opacity, may reflect atelectasis or infiltrate. Electronically Signed   By: KDonavan FoilM.D.   On: 11/26/2017 17:02    Pending Labs Unresulted Labs (From admission, onward)    Start     Ordered   11/26/17 1659  Blood culture (routine x 2)  BLOOD CULTURE X 2,   STAT     11/26/17 1658   11/26/17 1656  Urinalysis, Routine  w reflex microscopic  STAT,   R     11/26/17 1656           Vitals/Pain Today's Vitals   11/26/17 1855 11/26/17 1856 11/26/17 1857 11/26/17 1900  BP:    121/69  Pulse:      Resp: 17 20 15 17   Temp:      TempSrc:      SpO2: 100% 100% 100%   Weight:      Height:      PainSc:        Isolation Precautions No active isolations  Medications Medications  0.9 %  sodium chloride infusion (has no administration in time range)  iopamidol (ISOVUE-370) 76 % injection (has no administration in time range)  iopamidol (ISOVUE-370) 76 % injection 100 mL (100 mLs Intravenous Contrast Given 11/26/17 1814)    Mobility walks

## 2017-11-26 NOTE — ED Triage Notes (Signed)
Patient is from home and transported by [facility]. (He refused to be seen at [facility]). Patient is complaining of shortness of breath for the last hour. EMS reports his lung sounds are clear. Also, complains of left flank and back pain. Patients abd is distended but has live CA. Last chemo was last week. Left Arm BP: 130/70 and Right Arm BP: 90/50. Denies any nausea, vomiting, but had diarrhea earlier today.

## 2017-11-26 NOTE — H&P (Signed)
TRH H&P   Patient Demographics:    Robert Compton, is a 72 y.o. male  MRN: [id number]   DOB - 05-06-1945  Admit Date - 11/26/2017  Outpatient Primary MD for the patient is Avva, Steva Ready, MD  Referring MD/NP/PA:   Lennice Sites  Outpatient Specialists:    Curt Bears   Patient coming from:  home  Chief Complaint  Patient presents with  . Shortness of Breath      HPI:    Robert Compton  is a 72 y.o. 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    In Ed,  CTA chest IMPRESSION: 1. Suboptimal opacification of the pulmonary arteries. No central or lobar pulmonary embolism. 2. Slight interval increase in size of small right pleural effusion. Unchanged small left pleural effusion. 3. Unchanged right apical lung cancer. Stable mediastinal adenopathy and hepatic metastases. 4. Nodular liver contour, suggestive of cirrhosis. Unchanged small ascites. 5.  Aortic atherosclerosis (ICD10-I70.0).  Na 136, K 4.4, Bun 14, Creatinine 0.94 Alb 2.4  Ast 47, Alt 33, Alk phos 139, T. b ili 2.8  Pt will be admitted for dyspnea secondary to symptomatic anemia, and severe protein calorie malnutrition    Review of systems:    In addition to the HPI above, No Fever-chills, No Headache, No changes with Vision or hearing, No problems swallowing food or Liquids, No Chest pain, No Cough No Abdominal pain, No Nausea or Vommitting, Bowel movements are regular, No Blood in stool or Urine, No dysuria, No new skin rashes or bruises, No new joints pains-aches,  No new weakness, tingling, numbness in any extremity, No recent weight gain or loss, No polyuria,  polydypsia or polyphagia, No significant Mental Stressors.  A full 10 point Review of Systems was done, except as stated above, all other Review of Systems were negative.   With Past History of the following :    Past Medical History:  Diagnosis Date  . Arthritis   . Cancer ([place])   . Diabetes mellitus without complication ([place])   . Hypertension       Past Surgical History:  Procedure Laterality Date  . HERNIA REPAIR    . JOINT REPLACEMENT Right    knee      Social History:     Social History   Tobacco Use  . Smoking status: Former Smoker    Types: Cigarettes    Last attempt to quit: 09/20/2017    Years since quitting: 0.1  . Smokeless tobacco: Never Used  Substance Use Topics  . Alcohol use: No     Lives - at home  Mobility - walks by self   Family History :     Family History  Problem  Relation Age of Onset  . Diabetes Mother   . Colon cancer Father   . CAD Neg Hx        Home Medications:   Prior to Admission medications   Medication Sig Start Date End Date Taking? Authorizing Provider  fish oil-omega-3 fatty acids 1000 MG capsule Take 1 g by mouth 2 (two) times daily.    [provider]  fluticasone (FLONASE) 50 MCG/ACT nasal spray Place 2 sprays into both nostrils daily as needed for allergies. 11/12/17   Eugenie Filler, MD  insulin aspart (NOVOLOG) 100 UNIT/ML injection CBG < 70: implement hypoglycemia protocol CBG 70 - 120: 0 units CBG 121 - 150: 2 units CBG 151 - 200: 3 units CBG 201 - 250: 5 units CBG 251 - 300: 8 units CBG 301 - 350: 11 units CBG 351 - 400: 15 units CBG > 400: call MD 10/08/17   Lavina Hamman, MD  levofloxacin (LEVAQUIN) 750 MG tablet Take 1 tablet (750 mg total) by mouth daily at 3 pm. 11/13/17   Eugenie Filler, MD  loratadine (CLARITIN) 10 MG tablet Take 1 tablet (10 mg total) by mouth daily. 11/13/17   Eugenie Filler, MD  Melatonin 3 MG CAPS Take 3 mg by mouth at bedtime.     [provider]    metFORMIN (GLUCOPHAGE) 1000 MG tablet Take 1,000 mg by mouth 2 (two) times daily with a meal.    [provider]  nicotine (NICODERM CQ - DOSED IN MG/24 HOURS) 21 mg/24hr patch Place 1 patch (21 mg total) onto the skin daily. 11/12/17   Eugenie Filler, MD  omeprazole (PRILOSEC) 20 MG capsule Take 20 mg by mouth daily.    [provider]  ondansetron (ZOFRAN-ODT) 8 MG disintegrating tablet Take 8 mg by mouth every 6 (six) hours as needed for nausea or vomiting. for nausea 09/25/17   [provider]  polyethylene glycol (MIRALAX / GLYCOLAX) packet Take 17 g by mouth daily. Patient not taking: Reported on 11/18/2017 10/09/17   Lavina Hamman, MD  Red Yeast Rice 600 MG TABS Take 1 tablet by mouth 2 (two) times daily.     [provider]  traMADol (ULTRAM) 50 MG tablet Take 1 tablet (50 mg total) by mouth every 6 (six) hours as needed for moderate pain. Patient taking differently: Take 50 mg by mouth every 4 (four) hours as needed for moderate pain.  10/08/17   Lavina Hamman, MD     Allergies:     Allergies  Allergen Reactions  . Amoxicillin-Pot Clavulanate Anaphylaxis  . Nitrofurantoin Anaphylaxis     Physical Exam:   Vitals  Blood pressure 102/62, pulse (!) 104, temperature 98 F (36.7 C), temperature source Oral, resp. rate 19, height 6' 3"  (1.905 m), weight (!) 139.7 kg, SpO2 100 %.   1. General  lying in bed in NAD,    2. Normal affect and insight, Not Suicidal or Homicidal, Awake Alert, Oriented X 3.  3. No F.N deficits, ALL C.Nerves Intact, Strength 5/5 all 4 extremities, Sensation intact all 4 extremities, Plantars down going.  4. Ears and Eyes appear Normal, Conjunctivae pale, PERRLA. Moist Oral Mucosa.  5. Supple Neck, No JVD, No cervical lymphadenopathy appriciated, No Carotid Bruits.  6. Symmetrical Chest wall movement, Good air movement bilaterally, CTAB.  7. RRR, No Gallops, Rubs or Murmurs, No Parasternal Heave.  8. Positive  Bowel Sounds, Abdomen Soft, No tenderness, No organomegaly appriciated,No rebound -guarding or  rigidity.  9.  No Cyanosis, Normal Skin Turgor, No Skin Rash or Bruise.  10. Good muscle tone,  joints appear normal , no effusions, Normal ROM.  11. No Palpable Lymph Nodes in Neck or Axillae      Data Review:    CBC Recent Labs  Lab 11/24/17 1817 11/26/17 1640  WBC 12.1* 2.4*  HGB 7.5* 6.6*  HCT 22.6* 19.3*  PLT 75* 35*  MCV 99.6 96.5  MCH 33.0 33.0  MCHC 33.2 34.2  RDW 18.3* 17.7*  LYMPHSABS 0.8 0.7  MONOABS 0.0* 0.2  EOSABS 0.0 0.0  BASOSABS 0.0 0.0   ------------------------------------------------------------------------------------------------------------------  Chemistries  Recent Labs  Lab 11/24/17 1817 11/26/17 1640  NA 138 136  K 4.3 4.4  CL 104 100  CO2 25 25  GLUCOSE 151* 137*  BUN 14 14  CREATININE 0.79 0.94  CALCIUM 8.1* 8.1*  AST 41 47*  ALT 29 33  ALKPHOS 122 139*  BILITOT 2.9* 2.8*   ------------------------------------------------------------------------------------------------------------------ estimated creatinine clearance is 110.3 mL/min (by C-G formula based on SCr of 0.94 mg/dL). ------------------------------------------------------------------------------------------------------------------ No results for input(s): TSH, T4TOTAL, T3FREE, THYROIDAB in the last 72 hours.  Invalid input(s): FREET3  Coagulation profile No results for input(s): INR, PROTIME in the last 168 hours. ------------------------------------------------------------------------------------------------------------------- No results for input(s): DDIMER in the last 72 hours. -------------------------------------------------------------------------------------------------------------------  Cardiac Enzymes No results for input(s): CKMB, TROPONINI, MYOGLOBIN in the last 168 hours.  Invalid input(s):  CK ------------------------------------------------------------------------------------------------------------------    Component Value Date/Time   BNP 116.9 (H) 11/26/2017 1640     ---------------------------------------------------------------------------------------------------------------  Urinalysis    Component Value Date/Time   COLORURINE AMBER (A) 11/09/2017 0447   APPEARANCEUR HAZY (A) 11/09/2017 0447   LABSPEC 1.033 (H) 11/09/2017 0447   PHURINE 5.0 11/09/2017 [place] 11/09/2017 0447   HGBUR NEGATIVE 11/09/2017 0447   BILIRUBINUR SMALL (A) 11/09/2017 0447   KETONESUR NEGATIVE 11/09/2017 0447   PROTEINUR NEGATIVE 11/09/2017 0447   NITRITE NEGATIVE 11/09/2017 0447   LEUKOCYTESUR NEGATIVE 11/09/2017 0447    ----------------------------------------------------------------------------------------------------------------   Imaging Results:    Dg Chest Port 1 View  Result Date: 11/26/2017 CLINICAL DATA:  Short of breath EXAM: PORTABLE CHEST 1 VIEW COMPARISON:  11/24/2017, 11/08/2017, CT chest 11/19/2017, PET-CT 10/21/2017 FINDINGS: Hazy bibasilar opacity. Stable cardiomediastinal silhouette with aortic atherosclerosis. No pneumothorax. CT demonstrated right apical lung nodule not well seen radiographically. IMPRESSION: Hazy bibasilar opacity, may reflect atelectasis or infiltrate. Electronically Signed   By: Donavan Foil M.D.   On: 11/26/2017 17:02       Assessment & Plan:    Active Problems:   Extensive stage primary small cell carcinoma of lung (HCC)   Tachycardia   Symptomatic anemia    Dyspnea, fatigue likely due to symptomatic anemia Symptomatic anemia Transfuse 2 units prbc Check cbc in am  Tachycardiac likely due to symptomatic anemia CTA chest=> negative for PE Tele Trop I q6h x3 tsh Check cardiac echo if not resolving  Right lower ext edema, Left upper ext edema Check ultrasound r/o DVT  Metastatic small cell lung cancer to  liver/ bone Recommended that he go get service connection for agent orange at [facility] Please f/u with Curt Bears   Dm2 fsbs ac and qhs, ISS Continue metformin  Lower ext edema Cont lasix 64m po qday  DVT Prophylaxis Lovenox - SCDs  AM Labs Ordered, also please review Full Orders  Family Communication: Admission, patients condition and plan of care including tests being ordered have been discussed with the  patient  who indicate understanding and agree with the plan and Code Status.  Code Status  FULL  CODE  Likely DC to  home  Condition GUARDED   Consults called: none  Admission status:  observation  Time spent in minutes : 60   [name] M.D on 11/26/2017 at 6:48 PM  Between 7am to 7pm - Pager - [phone] . After 7pm go to [email or web address] - password [facility]  [facility] - Office  [phone]

## 2017-11-26 NOTE — ED Notes (Signed)
ED provider made aware patient has critical lactic value of 3.41

## 2017-11-26 NOTE — ED Notes (Signed)
Bed: TK24 Expected date:  Expected time:  Means of arrival:  Comments: shob

## 2017-11-26 NOTE — ED Notes (Signed)
ED provider made aware that patient has Lactic value of 2.32

## 2017-11-27 ENCOUNTER — Observation Stay (HOSPITAL_COMMUNITY): Payer: BLUE CROSS/BLUE SHIELD

## 2017-11-27 ENCOUNTER — Other Ambulatory Visit: Payer: Self-pay

## 2017-11-27 DIAGNOSIS — Z79891 Long term (current) use of opiate analgesic: Secondary | ICD-10-CM | POA: Diagnosis not present

## 2017-11-27 DIAGNOSIS — I110 Hypertensive heart disease with heart failure: Secondary | ICD-10-CM | POA: Diagnosis present

## 2017-11-27 DIAGNOSIS — M1990 Unspecified osteoarthritis, unspecified site: Secondary | ICD-10-CM | POA: Diagnosis present

## 2017-11-27 DIAGNOSIS — C3411 Malignant neoplasm of upper lobe, right bronchus or lung: Secondary | ICD-10-CM | POA: Diagnosis not present

## 2017-11-27 DIAGNOSIS — T451X5A Adverse effect of antineoplastic and immunosuppressive drugs, initial encounter: Secondary | ICD-10-CM | POA: Diagnosis present

## 2017-11-27 DIAGNOSIS — R000 Tachycardia, unspecified: Secondary | ICD-10-CM | POA: Diagnosis present

## 2017-11-27 DIAGNOSIS — D72829 Elevated white blood cell count, unspecified: Secondary | ICD-10-CM | POA: Diagnosis present

## 2017-11-27 DIAGNOSIS — E43 Unspecified severe protein-calorie malnutrition: Secondary | ICD-10-CM | POA: Diagnosis present

## 2017-11-27 DIAGNOSIS — M7989 Other specified soft tissue disorders: Secondary | ICD-10-CM | POA: Diagnosis not present

## 2017-11-27 DIAGNOSIS — Z833 Family history of diabetes mellitus: Secondary | ICD-10-CM | POA: Diagnosis not present

## 2017-11-27 DIAGNOSIS — Z794 Long term (current) use of insulin: Secondary | ICD-10-CM | POA: Diagnosis not present

## 2017-11-27 DIAGNOSIS — Z96651 Presence of right artificial knee joint: Secondary | ICD-10-CM | POA: Diagnosis not present

## 2017-11-27 DIAGNOSIS — I5033 Acute on chronic diastolic (congestive) heart failure: Secondary | ICD-10-CM

## 2017-11-27 DIAGNOSIS — I5032 Chronic diastolic (congestive) heart failure: Secondary | ICD-10-CM | POA: Diagnosis not present

## 2017-11-27 DIAGNOSIS — K219 Gastro-esophageal reflux disease without esophagitis: Secondary | ICD-10-CM | POA: Diagnosis present

## 2017-11-27 DIAGNOSIS — Z6841 Body Mass Index (BMI) 40.0 and over, adult: Secondary | ICD-10-CM | POA: Diagnosis not present

## 2017-11-27 DIAGNOSIS — Z87891 Personal history of nicotine dependence: Secondary | ICD-10-CM | POA: Diagnosis not present

## 2017-11-27 DIAGNOSIS — R509 Fever, unspecified: Secondary | ICD-10-CM | POA: Diagnosis present

## 2017-11-27 DIAGNOSIS — Z800 Family history of malignant neoplasm of digestive organs: Secondary | ICD-10-CM | POA: Diagnosis not present

## 2017-11-27 DIAGNOSIS — Z881 Allergy status to other antibiotic agents status: Secondary | ICD-10-CM | POA: Diagnosis not present

## 2017-11-27 DIAGNOSIS — R0602 Shortness of breath: Secondary | ICD-10-CM | POA: Diagnosis present

## 2017-11-27 DIAGNOSIS — C787 Secondary malignant neoplasm of liver and intrahepatic bile duct: Secondary | ICD-10-CM | POA: Diagnosis present

## 2017-11-27 DIAGNOSIS — D61810 Antineoplastic chemotherapy induced pancytopenia: Secondary | ICD-10-CM | POA: Diagnosis present

## 2017-11-27 DIAGNOSIS — Y92230 Patient room in hospital as the place of occurrence of the external cause: Secondary | ICD-10-CM | POA: Diagnosis present

## 2017-11-27 DIAGNOSIS — C3490 Malignant neoplasm of unspecified part of unspecified bronchus or lung: Secondary | ICD-10-CM | POA: Diagnosis present

## 2017-11-27 DIAGNOSIS — R339 Retention of urine, unspecified: Secondary | ICD-10-CM | POA: Diagnosis not present

## 2017-11-27 DIAGNOSIS — E119 Type 2 diabetes mellitus without complications: Secondary | ICD-10-CM | POA: Diagnosis present

## 2017-11-27 DIAGNOSIS — C7951 Secondary malignant neoplasm of bone: Secondary | ICD-10-CM | POA: Diagnosis present

## 2017-11-27 DIAGNOSIS — E876 Hypokalemia: Secondary | ICD-10-CM | POA: Diagnosis present

## 2017-11-27 DIAGNOSIS — Z888 Allergy status to other drugs, medicaments and biological substances status: Secondary | ICD-10-CM | POA: Diagnosis not present

## 2017-11-27 DIAGNOSIS — Z79890 Hormone replacement therapy (postmenopausal): Secondary | ICD-10-CM | POA: Diagnosis not present

## 2017-11-27 DIAGNOSIS — R140 Abdominal distension (gaseous): Secondary | ICD-10-CM | POA: Diagnosis not present

## 2017-11-27 DIAGNOSIS — Z79899 Other long term (current) drug therapy: Secondary | ICD-10-CM | POA: Diagnosis not present

## 2017-11-27 LAB — GLUCOSE, CAPILLARY
GLUCOSE-CAPILLARY: 115 mg/dL — AB (ref 70–99)
Glucose-Capillary: 117 mg/dL — ABNORMAL HIGH (ref 70–99)
Glucose-Capillary: 132 mg/dL — ABNORMAL HIGH (ref 70–99)
Glucose-Capillary: 161 mg/dL — ABNORMAL HIGH (ref 70–99)

## 2017-11-27 LAB — COMPREHENSIVE METABOLIC PANEL
ALBUMIN: 2.3 g/dL — AB (ref 3.5–5.0)
ALT: 32 U/L (ref 0–44)
AST: 39 U/L (ref 15–41)
Alkaline Phosphatase: 117 U/L (ref 38–126)
Anion gap: 10 (ref 5–15)
BILIRUBIN TOTAL: 3.4 mg/dL — AB (ref 0.3–1.2)
BUN: 12 mg/dL (ref 8–23)
CO2: 25 mmol/L (ref 22–32)
CREATININE: 0.66 mg/dL (ref 0.61–1.24)
Calcium: 7.9 mg/dL — ABNORMAL LOW (ref 8.9–10.3)
Chloride: 101 mmol/L (ref 98–111)
GFR calc Af Amer: >60 mL/min (ref >60)
GLUCOSE: 154 mg/dL — AB (ref 70–99)
POTASSIUM: 4 mmol/L (ref 3.5–5.1)
Sodium: 136 mmol/L (ref 135–145)
TOTAL PROTEIN: 5.2 g/dL — AB (ref 6.5–8.1)

## 2017-11-27 LAB — TROPONIN I: Troponin I: 0.03 ng/mL (ref <0.03)

## 2017-11-27 LAB — CBC
HEMATOCRIT: 22.7 % — AB (ref 39.0–52.0)
Hemoglobin: 7.7 g/dL — ABNORMAL LOW (ref 13.0–17.0)
MCH: 32.4 pg (ref 26.0–34.0)
MCHC: 33.9 g/dL (ref 30.0–36.0)
MCV: 95.4 fL (ref 78.0–100.0)
PLATELETS: 27 10*3/uL — AB (ref 150–400)
RBC: 2.38 MIL/uL — ABNORMAL LOW (ref 4.22–5.81)
RDW: 16.9 % — ABNORMAL HIGH (ref 11.5–15.5)
WBC: 2.1 10*3/uL — ABNORMAL LOW (ref 4.0–10.5)

## 2017-11-27 MED ORDER — FUROSEMIDE 10 MG/ML IJ SOLN
20.0000 mg | Freq: Two times a day (BID) | INTRAMUSCULAR | Status: DC
  Administered 2017-11-27 – 2017-11-28 (×3): 20 mg via INTRAVENOUS
  Filled 2017-11-27 (×3): qty 2

## 2017-11-27 NOTE — Progress Notes (Signed)
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  He states that he started having lower extremity and left upper extremity swelling on Sunday.  He presented to the emergency department.  In the emergency department, he has Dopplers were completed which were negative for DVT.  Patient was discharged home in stable condition.  He states that he returned due to continued swelling.  New events last 24 hours / Subjective: No acute events, swelling of his upper extremity has now resolved, although lower extremity edema persists. Denies SOB at rest although has exertional dyspnea.   Assessment & Plan:   Principal Problem:   Acute on chronic diastolic (congestive) heart failure (HCC) Active Problems:   Extensive stage primary small cell carcinoma of lung (HCC)   Tachycardia   Symptomatic anemia   Leg edema, right  Acute on chronic diastolic heart failure -Echocardiogram in June 2019 revealed EF 50 to 79%, grade 1 diastolic dysfunction -BNP mildly elevated 116.9 -Patient presents with bilateral lower extremity pitting edema with exertional dyspnea -Bilateral lower extremity venous Doppler 4 days ago was negative for DVT, cancel new orders today -Patient had previously been on p.o. Lasix, increased to IV Lasix 20mg  BID  -Strict I's and O's -Daily weight   Metastatic small cell lung cancer -Follows with [name]  Pancytopenia -Likely due to chemo -Transfused 2u pRBC 8/14  -Trend CBC, transfuse platelet < 10, Hgb < 7 -Monitor for bleeding   DM -SSI   Tobacco abuse -Nicotine patch    DVT prophylaxis: SCD Code Status: Full Family Communication: No family at bedside Disposition Plan: Pending improvement     Consultants:   None  Procedures:   None   Antimicrobials:  Anti-infectives (From admission, onward)   None       Objective: Vitals:   11/27/17 0345 11/27/17 0348 11/27/17 0500 11/27/17 0651  BP:  111/70  135/66  Pulse:  88  96  Resp:    20  Temp: 98.2 F (36.8 C) 98.2 F (36.8 C)  98.2 F (36.8 C)  TempSrc: Oral     SpO2:  100%  98%  Weight:   (!) 153.3 kg   Height:        Intake/Output Summary (Last 24 hours) at 11/27/2017 1236 Last data filed at 11/27/2017 0930 Gross per 24 hour  Intake 1078 ml  Output 750 ml  Net 328 ml   Filed Weights   11/26/17 1637 11/27/17 0500  Weight: (!) 139.7 kg (!) 153.3 kg    Examination:  General exam: Appears calm and comfortable  Respiratory system: Clear to auscultation. Respiratory effort normal. Cardiovascular system: S1 & S2 heard, RRR. No JVD, murmurs, rubs, gallops or clicks.  Gastrointestinal system: Abdomen is nondistended, soft and nontender. No organomegaly or masses felt. Normal bowel sounds heard. Central nervous system: Alert and oriented. No focal neurological deficits. Extremities: +Bilateral LE +1 pitting edema, no edema of upper extremity noted  Psychiatry: Judgement and insight appear normal. Mood & affect appropriate.   Data Reviewed: I have personally reviewed following labs and imaging studies  CBC: Recent Labs  Lab 11/24/17 1817 11/26/17 1640 11/27/17 0901  WBC 12.1* 2.4* 2.1*  NEUTROABS 11.3* 1.5*  --   HGB 7.5* 6.6* 7.7*  HCT 22.6* 19.3* 22.7*  MCV 99.6 96.5 95.4  PLT 75* 35* 27*   Basic Metabolic Panel: Recent Labs  Lab 11/24/17 1817 11/26/17 1640 11/27/17 0901  NA 138 136 136  K 4.3 4.4 4.0  CL 104 100 101  CO2 25 25 25   GLUCOSE 151* 137* 154*  BUN 14 14 12   CREATININE 0.79 0.94 0.66  CALCIUM 8.1* 8.1* 7.9*   GFR: Estimated Creatinine Clearance: 136.1 mL/min (by C-G formula based on SCr of 0.66 mg/dL). Liver Function Tests: Recent Labs  Lab 11/24/17 1817 11/26/17 1640  11/27/17 0901  AST 41 47* 39  ALT 29 33 32  ALKPHOS 122 139* 117  BILITOT 2.9* 2.8* 3.4*  PROT 5.5* 5.5* 5.2*  ALBUMIN 2.3* 2.4* 2.3*   No results for input(s): LIPASE, AMYLASE in the last 168 hours. No results for input(s): AMMONIA in the last 168 hours. Coagulation Profile: No results for input(s): INR, PROTIME in the last 168 hours. Cardiac Enzymes: Recent Labs  Lab 11/26/17 2114 11/27/17 0247 11/27/17 0901  TROPONINI 0.03* 0.03* <0.03   BNP (last 3 results) No results for input(s): PROBNP in the last 8760 hours. HbA1C: No results for input(s): HGBA1C in the last 72 hours. CBG: Recent Labs  Lab 11/26/17 2134 11/27/17 0738 11/27/17 1135  GLUCAP 120* 117* 132*   Lipid Profile: No results for input(s): CHOL, HDL, LDLCALC, TRIG, CHOLHDL, LDLDIRECT in the last 72 hours. Thyroid Function Tests: No results for input(s): TSH, T4TOTAL, FREET4, T3FREE, THYROIDAB in the last 72 hours. Anemia Panel: No results for input(s): VITAMINB12, FOLATE, FERRITIN, TIBC, IRON, RETICCTPCT in the last 72 hours. Sepsis Labs: Recent Labs  Lab 11/26/17 1647 11/26/17 1842  LATICACIDVEN 3.41* 2.32*    No results found for this or any previous visit (from the past 240 hour(s)).     Radiology Studies: Ct Angio Chest Pe W Or Wo Contrast  Result Date: 11/26/2017 CLINICAL DATA:  Shortness of breath. History of small cell lung cancer, currently on chemotherapy. EXAM: CT ANGIOGRAPHY CHEST WITH CONTRAST TECHNIQUE: Multidetector CT imaging of the chest was performed using the standard protocol during bolus administration of intravenous contrast. Multiplanar CT image reconstructions and MIPs were obtained to evaluate the vascular anatomy. CONTRAST:  132mL ISOVUE-370 IOPAMIDOL (ISOVUE-370) INJECTION 76% COMPARISON:  CT chest dated November 19, 2017. FINDINGS: Cardiovascular: Suboptimal opacification of the pulmonary arteries due to bolus timing. No central or lobar pulmonary embolism. The segmental  pulmonary arteries are not well evaluated. Normal heart size. No pericardial effusion. Normal caliber thoracic aorta. Coronary, aortic arch, and branch vessel atherosclerotic vascular disease. Mediastinum/Nodes: Unchanged enlarged 1.2 cm and 2.7 cm right paratracheal lymph nodes. Unchanged 9 mm right periaortic lymph node. No hilar or axillary lymphadenopathy. The thyroid gland, trachea, and esophagus are unremarkable. Lungs/Pleura: Unchanged 8 mm irregular nodule within the right lung apex. Increased linear atelectasis in the right lung apex. One 6 mm nodule along the minor fissure is unchanged in size. The other previously seen nodule along the minor fissure is no longer identified. No new pulmonary nodule. Slight interval increase in size of small right pleural effusion with adjacent right lower lobe subsegmental atelectasis. Unchanged small left pleural effusion. No consolidation or pneumothorax. Upper Abdomen: Nodular liver contour suggestive of cirrhosis. Hypodense metastases within the posterior right hepatic lobe are not as well evaluated on today's study, but appear grossly unchanged. Small ascites, unchanged. Musculoskeletal: Unchanged subcentimeter lucent lesions in the T5 and T9 vertebral bodies. No acute osseous finding. Review of the MIP  images confirms the above findings. IMPRESSION: 1. Suboptimal opacification of the pulmonary arteries. No central or lobar pulmonary embolism. 2. Slight interval increase in size of small right pleural effusion. Unchanged small left pleural effusion. 3. Unchanged right apical lung cancer. Stable mediastinal adenopathy and hepatic metastases. 4. Nodular liver contour, suggestive of cirrhosis. Unchanged small ascites. 5.  Aortic atherosclerosis (ICD10-I70.0). Electronically Signed   By: Titus Dubin M.D.   On: 11/26/2017 18:55   Dg Chest Port 1 View  Result Date: 11/26/2017 CLINICAL DATA:  Short of breath EXAM: PORTABLE CHEST 1 VIEW COMPARISON:  11/24/2017,  11/08/2017, CT chest 11/19/2017, PET-CT 10/21/2017 FINDINGS: Hazy bibasilar opacity. Stable cardiomediastinal silhouette with aortic atherosclerosis. No pneumothorax. CT demonstrated right apical lung nodule not well seen radiographically. IMPRESSION: Hazy bibasilar opacity, may reflect atelectasis or infiltrate. Electronically Signed   By: Donavan Foil M.D.   On: 11/26/2017 17:02      Scheduled Meds: . feeding supplement (ENSURE ENLIVE)  237 mL Oral BID BM  . feeding supplement (PRO-STAT SUGAR FREE 64)  30 mL Oral BID  . furosemide  20 mg Intravenous Q12H  . insulin aspart  0-5 Units Subcutaneous QHS  . insulin aspart  0-9 Units Subcutaneous TID WC  . loratadine  10 mg Oral Daily  . nicotine  21 mg Transdermal Daily  . pantoprazole  40 mg Oral Daily  . sodium chloride flush  3 mL Intravenous Q12H   Continuous Infusions: . sodium chloride       LOS: 0 days    Time spent: 40 minutes   Dessa Phi, DO [facility] [email or web address] Password [facility] 11/27/2017, 12:36 PM

## 2017-11-27 NOTE — Progress Notes (Signed)
Pt has arrived to 1509 from the ED , oriented to the room, placed on telemetry, condom cath applied at pts request. Rt foot has a large serous filled blister to the top of his foot he reports is very painful. Rt lower leg with redness ( purplish discoloration ) of  skin, Rt lateral calf has an area of weeping. Vasoline gauze applied to those 2 areas & wrapped with a kerlex gauze.

## 2017-11-27 NOTE — Progress Notes (Signed)
Initial Nutrition Assessment  DOCUMENTATION CODES:   Obesity unspecified  INTERVENTION:   -Continue Ensure Enlive po BID, each supplement provides 350 kcal and 20 grams of protein -Continue Prostat liquid protein PO 30 ml BID with meals, each supplement provides 100 kcal, 15 grams protein.  NUTRITION DIAGNOSIS:   Increased nutrient needs related to wound healing, cancer and cancer related treatments as evidenced by estimated needs.  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   Patient in room with no family at bedside. Pt states his appetite is fine today. Denies any changes in appetite recently. Pt consumed 100% of his breakfast (providing ~590 kcal, 12g protein). Pt states he sometimes drinks protein supplements at home but only when he feels like he needs it.  Pt was admitted in July 2019, at that time pt was having poor appetite and not eating well following chemotherapy. Weight was ~266 lb per weight records. Pt states he was weighing that much without swelling. After that admission, he states he "blew up". Pt with +70 lb since 7/16. Pt now on IV Lasix.  Pt willing to continue protein supplements and states his lunch is on its way.  Medications: IV Lasix every 12 hours Labs reviewed: CBGs: 117-132   NUTRITION - FOCUSED PHYSICAL EXAM:  Nutrition focused physical exam shows no sign of depletion of muscle mass or body fat. Patient with mild edema.  Diet Order:   Diet Order            Diet Carb Modified Fluid consistency: Thin; Room service appropriate? Yes  Diet effective now              EDUCATION NEEDS:    Education needs have been addressed  Skin:  Skin Assessment: Skin Integrity Issues: Skin Integrity Issues:: Stage II Stage II: buttocks  Last BM:  8/13  Height:   Ht Readings from Last 1 Encounters:  11/26/17 6\' 3"  (1.905 m)    Weight:   Wt Readings from Last 1 Encounters:  11/27/17 (!) 153.3 kg    Ideal Body Weight:  89.1 kg  BMI:  Body mass index is 42.25 kg/m.  Estimated Nutritional Needs:   Kcal:  2600-2800  Protein:  130-140g   Fluid:  2L/day  Clayton Bibles, MS, RD, LDN [place] Dietitian Pager: [phone] After Hours Pager: [phone]

## 2017-11-27 NOTE — Care Management Note (Signed)
Case Management Note  Patient Details  Name: Robert Compton MRN: [id number] Date of Birth: 1946-01-06  Subjective/Objective:                   72 y.o. 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    In Ed,  CTA chest IMPRESSION: 1. Suboptimal opacification of the pulmonary arteries. No central or lobar pulmonary embolism. 2. Slight interval increase in size of small right pleural effusion. Unchanged small left pleural effusion. 3. Unchanged right apical lung cancer. Stable mediastinal adenopathy and hepatic metastases. 4. Nodular liver contour, suggestive of cirrhosis. Unchanged small ascites. 5. Aortic atherosclerosis (ICD10-I70.0).  Na 136, K 4.4, Bun 14, Creatinine 0.94 Alb 2.4  Ast 47, Alt 33, Alk phos 139, T. b ili 2.8  Pt will be admitted for dyspnea secondary to symptomatic anemia, and severe protein calorie malnutrition  Action/Plan: Will follow for medical progression and cm needs/ may need home 02 and hhc will follow  Expected Discharge Date:  (unknown)               Expected Discharge Plan:     In-House Referral:     Discharge planning Services     Post Acute Care Choice:    Choice offered to:     DME Arranged:    DME Agency:     HH Arranged:    HH Agency:     Status of Service:     If discussed at [employer], dates discussed:    Additional Comments:  Leeroy Cha, RN 11/27/2017, 10:50 AM

## 2017-11-28 LAB — TYPE AND SCREEN
ABO/RH(D): A POS
Antibody Screen: NEGATIVE
UNIT DIVISION: 0
UNIT DIVISION: 0

## 2017-11-28 LAB — CBC
HEMATOCRIT: 22.1 % — AB (ref 39.0–52.0)
Hemoglobin: 7.5 g/dL — ABNORMAL LOW (ref 13.0–17.0)
MCH: 32.2 pg (ref 26.0–34.0)
MCHC: 33.9 g/dL (ref 30.0–36.0)
MCV: 94.8 fL (ref 78.0–100.0)
Platelets: 24 10*3/uL — CL (ref 150–400)
RBC: 2.33 MIL/uL — ABNORMAL LOW (ref 4.22–5.81)
RDW: 16.9 % — AB (ref 11.5–15.5)
WBC: 1.8 10*3/uL — ABNORMAL LOW (ref 4.0–10.5)

## 2017-11-28 LAB — BPAM RBC
Blood Product Expiration Date: 201909062359
Blood Product Expiration Date: 201909062359
ISSUE DATE / TIME: 201908140005
ISSUE DATE / TIME: 201908140321
Unit Type and Rh: 6200
Unit Type and Rh: 6200

## 2017-11-28 LAB — BASIC METABOLIC PANEL
Anion gap: 8 (ref 5–15)
BUN: 10 mg/dL (ref 8–23)
CO2: 30 mmol/L (ref 22–32)
Calcium: 8.2 mg/dL — ABNORMAL LOW (ref 8.9–10.3)
Chloride: 101 mmol/L (ref 98–111)
Creatinine, Ser: 0.74 mg/dL (ref 0.61–1.24)
GFR calc Af Amer: >60 mL/min (ref >60)
GLUCOSE: 157 mg/dL — AB (ref 70–99)
Potassium: 3.7 mmol/L (ref 3.5–5.1)
Sodium: 139 mmol/L (ref 135–145)

## 2017-11-28 LAB — GLUCOSE, CAPILLARY
Glucose-Capillary: 130 mg/dL — ABNORMAL HIGH (ref 70–99)
Glucose-Capillary: 125 mg/dL — ABNORMAL HIGH (ref 70–99)
Glucose-Capillary: 134 mg/dL — ABNORMAL HIGH (ref 70–99)

## 2017-11-28 MED ORDER — TRAMADOL HCL 50 MG PO TABS
50.0000 mg | ORAL_TABLET | Freq: Four times a day (QID) | ORAL | Status: DC | PRN
  Administered 2017-11-28 – 2017-12-01 (×5): 50 mg via ORAL
  Filled 2017-11-28 (×5): qty 1

## 2017-11-28 MED ORDER — FUROSEMIDE 10 MG/ML IJ SOLN
40.0000 mg | Freq: Two times a day (BID) | INTRAMUSCULAR | Status: DC
  Administered 2017-11-28 – 2017-12-02 (×8): 40 mg via INTRAVENOUS
  Filled 2017-11-28 (×8): qty 4

## 2017-11-28 MED ORDER — FLUTICASONE PROPIONATE 50 MCG/ACT NA SUSP
2.0000 | Freq: Every day | NASAL | Status: DC
  Administered 2017-11-28 – 2017-12-02 (×5): 2 via NASAL
  Filled 2017-11-28: qty 16

## 2017-11-28 MED ORDER — SALINE SPRAY 0.65 % NA SOLN
1.0000 | NASAL | Status: DC | PRN
  Filled 2017-11-28: qty 44

## 2017-11-28 NOTE — Care Management Note (Signed)
Case Management Note  Patient Details  Name: Damico Partin MRN: [id number] Date of Birth: April 11, 1946  Subjective/Objective:                  Acute on chronic diastolic (congestive) heart failure [facility]) Active Problems:   Extensive stage primary small cell carcinoma of lung (HCC)   Tachycardia   Symptomatic anemia   Leg edema, right Echocardiogram in June 2019 revealed EF 50 to 00%, grade 1 diastolic dysfunction -BNP mildly elevated 116.9 -Patient presents with bilateral lower extremity pitting edema with exertional dyspnea -Bilateral lower extremity venous Doppler 4 days ago was negative for DVT -Patient had previously been on p.o. Lasix, started on IV lasix 20mg  BID and has had 2.4L UOP last 24 hours. Continues to have bilateral pitting edema; increase to IV lasix 40mg  BID today  -Strict I's and O's; 2.4L UOP last 24 hours  -Daily weight; 153kg-->147kg  Action/Plan: Following for progression and cm needs  Expected Discharge Date:  (unknown)               Expected Discharge Plan:     In-House Referral:     Discharge planning Services     Post Acute Care Choice:    Choice offered to:     DME Arranged:    DME Agency:     HH Arranged:    HH Agency:     Status of Service:     If discussed at [employer], dates discussed:    Additional Comments:  Leeroy Cha, RN 11/28/2017, 12:22 PM

## 2017-11-28 NOTE — Evaluation (Signed)
Physical Therapy Evaluation Patient Details Name: Robert Compton MRN: [id number] DOB: 06-14-1945 Today's Date: 11/28/2017   History of Present Illness  72 y.o. male with medical history significant of hypertension diabetes type 2, right TKR and  new diagnosis of metastatic cancer with liver mets admitted due to cellulitis  of right leg.  Clinical Impression  The patient  Mobilized to sitting on the bed edge with assist. Patient has  A blister on the right foot with dressing in place and dressing on the lower leg. The patient had multiple complaints about  His care, wanting blister to be popped. Pt admitted with above diagnosis. Pt currently with functional limitations due to the deficits listed below (see PT Problem List).  Pt will benefit from skilled PT to increase their independence and safety with mobility to allow discharge to the venue listed below.     Follow Up Recommendations (TBD)    Equipment Recommendations  None recommended by PT    Recommendations for Other Services       Precautions / Restrictions Precautions Precaution Comments: blister on right dorsal foot      Mobility  Bed Mobility Overal bed mobility: Needs Assistance Bed Mobility: Supine to Sit;Sit to Supine     Supine to sit: Mod assist Sit to supine: Mod assist   General bed mobility comments: assist with legs , use of bed rails  Transfers Overall transfer level: Needs assistance   Transfers: Sit to/from Stand Sit to Stand: Mod assist         General transfer comment: partial stand for bed  pads to be straightened  Ambulation/Gait             General Gait Details: NT  Stairs            Wheelchair Mobility    Modified Rankin (Stroke Patients Only)       Balance                                             Pertinent Vitals/Pain Pain Assessment: Faces Faces Pain Scale: Hurts even more Pain Location: right leg and foot Pain Descriptors / Indicators:  Aching;Guarding;Pressure Pain Intervention(s): Limited activity within patient's tolerance;Repositioned    Home Living Family/patient expects to be discharged to:: Private residence Living Arrangements: Children Available Help at Discharge: Family;Available PRN/intermittently Type of Home: House Home Access: Stairs to enter Entrance Stairs-Rails: None Entrance Stairs-Number of Steps: 2   Home Equipment: Walker - 4 wheels      Prior Function Level of Independence: Independent with assistive device(s)               Hand Dominance        Extremity/Trunk Assessment   Upper Extremity Assessment Upper Extremity Assessment: Overall WFL for tasks assessed(able to pull self up in bed using head board.)    Lower Extremity Assessment Lower Extremity Assessment: Generalized weakness;RLE deficits/detail RLE Deficits / Details: dressings on lower leg  and foot    Cervical / Trunk Assessment Cervical / Trunk Assessment: Normal  Communication   Communication: No difficulties  Cognition Arousal/Alertness: Awake/alert Behavior During Therapy: WFL for tasks assessed/performed Overall Cognitive Status: Within Functional Limits for tasks assessed  General Comments      Exercises     Assessment/Plan    PT Assessment Patient needs continued PT services  PT Problem List Decreased strength;Decreased range of motion;Decreased activity tolerance;Decreased balance;Decreased mobility;Decreased knowledge of precautions;Decreased safety awareness;Decreased knowledge of use of DME;Pain       PT Treatment Interventions DME instruction;Therapeutic exercise;Gait training;Functional mobility training;Therapeutic activities;Patient/family education    PT Goals (Current goals can be found in the Care Plan section)  Acute Rehab PT Goals Patient Stated Goal: to bust the blister, go home and be  taken care of PT Goal Formulation: With  patient Time For Goal Achievement: 12/12/17 Potential to Achieve Goals: Fair    Frequency Min 3X/week   Barriers to discharge Decreased caregiver support      Co-evaluation               AM-PAC PT "6 Clicks" Daily Activity  Outcome Measure Difficulty turning over in bed (including adjusting bedclothes, sheets and blankets)?: Unable Difficulty moving from lying on back to sitting on the side of the bed? : Unable Difficulty sitting down on and standing up from a chair with arms (e.g., wheelchair, bedside commode, etc,.)?: Unable Help needed moving to and from a bed to chair (including a wheelchair)?: Total Help needed walking in hospital room?: Total Help needed climbing 3-5 steps with a railing? : Total 6 Click Score: 6    End of Session   Activity Tolerance: Patient limited by fatigue Patient left: in bed;with call bell/phone within reach;with nursing/sitter in room Nurse Communication: Mobility status PT Visit Diagnosis: Unsteadiness on feet (R26.81)    Time: 7482-7078 PT Time Calculation (min) (ACUTE ONLY): 37 min   Charges:   PT Evaluation $PT Eval Low Complexity: 1 Low PT Treatments $Therapeutic Activity: 8-22 mins        [place] [facility] [phone]   Claretha Cooper 11/28/2017, 5:59 PM

## 2017-11-28 NOTE — Progress Notes (Addendum)
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  He states that he started having lower extremity and left upper extremity swelling on Sunday.  He presented to the emergency department.  In the emergency department, he has Dopplers were completed which were negative for DVT.  Patient was discharged home in stable condition.  He states that he returned due to continued swelling.  New events last 24 hours / Subjective: No new events, states he started having some postnasal drainage. Swelling of upper extremity resolved, continues to have lower extremity swelling. No SOB.   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 Lasix, started on IV lasix 20mg  BID and has had 2.4L UOP last 24 hours. Continues to have bilateral pitting edema; increase to IV lasix 40mg  BID today  -Strict I's and O's; 2.4L UOP last 24 hours  -Daily weight; 153kg-->147kg   Metastatic small cell lung cancer -Follows with [name]  Pancytopenia -Likely due to chemo -Transfused 2u pRBC 8/14  -Trend CBC, transfuse platelet < 10, Hgb < 7 -Monitor for bleeding   DM -SSI   Tobacco abuse -Nicotine patch    GERD -PPI    DVT prophylaxis: SCD Code Status: Full Family Communication: No family at bedside Disposition Plan: Pending improvement in edema, PT OT eval ordered today    Consultants:   None  Procedures:   None   Antimicrobials:  Anti-infectives (From admission, onward)   None       Objective: Vitals:   11/27/17 0651 11/27/17 1346 11/27/17 2023 11/28/17 0337  BP: 135/66 117/73 116/71 130/75  Pulse: 96 96 99 (!) 102  Resp: 20  20 20   Temp: 98.2 F (36.8 C)  98.5 F (36.9 C) 97.7 F (36.5 C)  TempSrc:      SpO2: 98% 96% 99% 95%  Weight:    (!) 147.3 kg  Height:        Intake/Output Summary (Last 24 hours) at 11/28/2017 0952 Last data filed at 11/28/2017 0332 Gross per 24 hour  Intake 480 ml  Output 2425 ml  Net -1945 ml   Filed Weights   11/26/17 1637 11/27/17 0500 11/28/17 0337  Weight: (!) 139.7 kg (!) 153.3 kg (!) 147.3 kg    Examination: General exam: Appears calm and comfortable  Respiratory system: Clear to auscultation. Respiratory effort normal. Cardiovascular system: S1 & S2 heard, RRR. No JVD, murmurs, rubs, gallops or clicks. +bilateral pitting edema  Gastrointestinal system: Abdomen is nondistended, soft and nontender. No organomegaly or masses felt. Normal bowel sounds heard. Central nervous system: Alert and oriented. No focal neurological deficits. Extremities: Symmetric 5 x 5 power. Skin: +blister dorsal foot on right  Psychiatry: Judgement and insight appear normal. Mood & affect appropriate.  Data Reviewed: I have personally reviewed following labs and imaging studies  CBC: Recent Labs  Lab 11/24/17 1817 11/26/17 1640 11/27/17 0901 11/28/17 0619  WBC 12.1* 2.4* 2.1* 1.8*  NEUTROABS 11.3* 1.5*  --   --   HGB 7.5* 6.6* 7.7* 7.5*  HCT 22.6* 19.3* 22.7* 22.1*  MCV 99.6 96.5 95.4 94.8  PLT 75* 35* 27* 24*   Basic Metabolic Panel: Recent Labs  Lab 11/24/17 1817 11/26/17 1640 11/27/17 0901 11/28/17 0619  NA 138 136  136 139  K 4.3 4.4 4.0 3.7  CL 104 100 101 101  CO2 25 25 25 30   GLUCOSE 151* 137* 154* 157*  BUN 14 14 12 10   CREATININE 0.79 0.94 0.66 0.74  CALCIUM 8.1* 8.1* 7.9* 8.2*   GFR: Estimated Creatinine Clearance: 133.2 mL/min (by C-G formula based on SCr of 0.74 mg/dL). Liver Function Tests: Recent Labs  Lab 11/24/17 1817 11/26/17 1640 11/27/17 0901  AST 41 47* 39  ALT 29 33 32  ALKPHOS 122 139* 117  BILITOT 2.9* 2.8* 3.4*  PROT 5.5* 5.5* 5.2*  ALBUMIN 2.3* 2.4* 2.3*   No results for input(s): LIPASE, AMYLASE in the last 168 hours. No results for input(s): AMMONIA in the last 168 hours. Coagulation Profile: No results for input(s): INR, PROTIME in the last 168 hours. Cardiac Enzymes: Recent Labs  Lab 11/26/17 2114 11/27/17 0247 11/27/17 0901  TROPONINI 0.03* 0.03* <0.03   BNP (last 3 results) No results for input(s): PROBNP in the last 8760 hours. HbA1C: No results for input(s): HGBA1C in the last 72 hours. CBG: Recent Labs  Lab 11/27/17 0738 11/27/17 1135 11/27/17 1701 11/27/17 2024 11/28/17 0832  GLUCAP 117* 132* 115* 161* 130*   Lipid Profile: No results for input(s): CHOL, HDL, LDLCALC, TRIG, CHOLHDL, LDLDIRECT in the last 72 hours. Thyroid Function Tests: No results for input(s): TSH, T4TOTAL, FREET4, T3FREE, THYROIDAB in the last 72 hours. Anemia Panel: No results for input(s): VITAMINB12, FOLATE, FERRITIN, TIBC, IRON, RETICCTPCT in the last 72 hours. Sepsis Labs: Recent Labs  Lab 11/26/17 1647 11/26/17 1842  LATICACIDVEN 3.41* 2.32*    No results found for this or any previous visit (from the past 240 hour(s)).     Radiology Studies: Ct Angio Chest Pe W Or Wo Contrast  Result Date: 11/26/2017 CLINICAL DATA:  Shortness of breath. History of small cell lung cancer, currently on chemotherapy. EXAM: CT ANGIOGRAPHY CHEST WITH CONTRAST TECHNIQUE: Multidetector CT imaging of the chest was performed using the standard protocol during bolus  administration of intravenous contrast. Multiplanar CT image reconstructions and MIPs were obtained to evaluate the vascular anatomy. CONTRAST:  127mL ISOVUE-370 IOPAMIDOL (ISOVUE-370) INJECTION 76% COMPARISON:  CT chest dated November 19, 2017. FINDINGS: Cardiovascular: Suboptimal opacification of the pulmonary arteries due to bolus timing. No central or lobar pulmonary embolism. The segmental pulmonary arteries are not well evaluated. Normal heart size. No pericardial effusion. Normal caliber thoracic aorta. Coronary, aortic arch, and branch vessel atherosclerotic vascular disease. Mediastinum/Nodes: Unchanged enlarged 1.2 cm and 2.7 cm right paratracheal lymph nodes. Unchanged 9 mm right periaortic lymph node. No hilar or axillary lymphadenopathy. The thyroid gland, trachea, and esophagus are unremarkable. Lungs/Pleura: Unchanged 8 mm irregular nodule within the right lung apex. Increased linear atelectasis in the right lung apex. One 6 mm nodule along the minor fissure is unchanged in size. The other previously seen nodule along the minor fissure is no longer identified. No new pulmonary nodule. Slight interval increase in size of small right pleural effusion  with adjacent right lower lobe subsegmental atelectasis. Unchanged small left pleural effusion. No consolidation or pneumothorax. Upper Abdomen: Nodular liver contour suggestive of cirrhosis. Hypodense metastases within the posterior right hepatic lobe are not as well evaluated on today's study, but appear grossly unchanged. Small ascites, unchanged. Musculoskeletal: Unchanged subcentimeter lucent lesions in the T5 and T9 vertebral bodies. No acute osseous finding. Review of the MIP images confirms the above findings. IMPRESSION: 1. Suboptimal opacification of the pulmonary arteries. No central or lobar pulmonary embolism. 2. Slight interval increase in size of small right pleural effusion. Unchanged small left pleural effusion. 3. Unchanged right apical lung  cancer. Stable mediastinal adenopathy and hepatic metastases. 4. Nodular liver contour, suggestive of cirrhosis. Unchanged small ascites. 5.  Aortic atherosclerosis (ICD10-I70.0). Electronically Signed   By: Titus Dubin M.D.   On: 11/26/2017 18:55   Dg Chest Port 1 View  Result Date: 11/26/2017 CLINICAL DATA:  Short of breath EXAM: PORTABLE CHEST 1 VIEW COMPARISON:  11/24/2017, 11/08/2017, CT chest 11/19/2017, PET-CT 10/21/2017 FINDINGS: Hazy bibasilar opacity. Stable cardiomediastinal silhouette with aortic atherosclerosis. No pneumothorax. CT demonstrated right apical lung nodule not well seen radiographically. IMPRESSION: Hazy bibasilar opacity, may reflect atelectasis or infiltrate. Electronically Signed   By: Donavan Foil M.D.   On: 11/26/2017 17:02      Scheduled Meds: . feeding supplement (ENSURE ENLIVE)  237 mL Oral BID BM  . feeding supplement (PRO-STAT SUGAR FREE 64)  30 mL Oral BID  . fluticasone  2 spray Each Nare Daily  . furosemide  40 mg Intravenous BID  . insulin aspart  0-5 Units Subcutaneous QHS  . insulin aspart  0-9 Units Subcutaneous TID WC  . loratadine  10 mg Oral Daily  . nicotine  21 mg Transdermal Daily  . pantoprazole  40 mg Oral Daily  . sodium chloride flush  3 mL Intravenous Q12H   Continuous Infusions: . sodium chloride       LOS: 1 day    Time spent: 25 minutes   Dessa Phi, DO [facility] [email or web address] Password [facility] 11/28/2017, 9:52 AM

## 2017-11-29 ENCOUNTER — Other Ambulatory Visit: Payer: Self-pay | Admitting: Radiology

## 2017-11-29 LAB — CBC
HEMATOCRIT: 23.6 % — AB (ref 39.0–52.0)
Hemoglobin: 8 g/dL — ABNORMAL LOW (ref 13.0–17.0)
MCH: 32.7 pg (ref 26.0–34.0)
MCHC: 33.9 g/dL (ref 30.0–36.0)
MCV: 96.3 fL (ref 78.0–100.0)
Platelets: 20 10*3/uL — CL (ref 150–400)
RBC: 2.45 MIL/uL — ABNORMAL LOW (ref 4.22–5.81)
RDW: 16.9 % — ABNORMAL HIGH (ref 11.5–15.5)
WBC: 2.5 10*3/uL — ABNORMAL LOW (ref 4.0–10.5)

## 2017-11-29 LAB — GLUCOSE, CAPILLARY
GLUCOSE-CAPILLARY: 170 mg/dL — AB (ref 70–99)
GLUCOSE-CAPILLARY: 137 mg/dL — AB (ref 70–99)
GLUCOSE-CAPILLARY: 135 mg/dL — AB (ref 70–99)
Glucose-Capillary: 129 mg/dL — ABNORMAL HIGH (ref 70–99)
Glucose-Capillary: 124 mg/dL — ABNORMAL HIGH (ref 70–99)

## 2017-11-29 LAB — BASIC METABOLIC PANEL
ANION GAP: 10 (ref 5–15)
BUN: 9 mg/dL (ref 8–23)
CALCIUM: 8 mg/dL — AB (ref 8.9–10.3)
CO2: 28 mmol/L (ref 22–32)
Chloride: 97 mmol/L — ABNORMAL LOW (ref 98–111)
Creatinine, Ser: 0.68 mg/dL (ref 0.61–1.24)
GFR calc non Af Amer: >60 mL/min (ref >60)
Glucose, Bld: 129 mg/dL — ABNORMAL HIGH (ref 70–99)
POTASSIUM: 4.6 mmol/L (ref 3.5–5.1)
Sodium: 135 mmol/L (ref 135–145)

## 2017-11-29 NOTE — Consult Note (Signed)
[facility] Nurse wound consult note Reason for Consult: Consult requested for right leg.  Pt has cellulitis and developed blisters a few days ago, which have ruptured. Wound type: Right anterior foot with partial thickness wound where previous blister ruptured; 4X5X.1cm, red and moist, small amt yellow drainage, no odor Right calf with the same appearance; 2X1X.1cm Periwound: Generalized erythremia and edema appears to be receeding Dressing procedure/placement/frequency: Vaseline gauze to promote healing.  Discussed plan of care with patient. Please re-consult if further assistance is needed.  Thank-you,  Julien Girt MSN, [place], [place], [place], [place]

## 2017-11-29 NOTE — Care Management Note (Signed)
Case Management Note  Patient Details  Name: Robert Compton MRN: [id number] Date of Birth: 1945-06-19  Subjective/Objective:                  cellulits of the rt lower leg/  Action/Plan: Reviewed for cm needs and progression/no cm needs noted  Expected Discharge Date:  (unknown)               Expected Discharge Plan:     In-House Referral:     Discharge planning Services     Post Acute Care Choice:    Choice offered to:     DME Arranged:    DME Agency:     HH Arranged:    HH Agency:     Status of Service:     If discussed at [employer], dates discussed:    Additional Comments:  Leeroy Cha, RN 11/29/2017, 12:00 PM

## 2017-11-29 NOTE — Progress Notes (Signed)
Physical Therapy Treatment Patient Details Name: Robert Compton MRN: [id number] DOB: Feb 23, 1946 Today's Date: 11/29/2017    History of Present Illness 72 y.o. male with medical history significant of hypertension diabetes type 2, right TKR and  new diagnosis of metastatic cancer with liver mets admitted due to cellulitis  of right leg.    PT Comments    Pt only agreeable to sit EOB today.  Pt encouraged to mobilize OOB however he continued to adamantly decline.  Pt left sitting EOB per his preference to eat his lunch tray.  Follow Up Recommendations  SNF(however pt likely not agreeable, so HHPT)     Equipment Recommendations  None recommended by PT    Recommendations for Other Services       Precautions / Restrictions Precautions Precautions: Fall Precaution Comments: dressing right foot    Mobility  Bed Mobility Overal bed mobility: Needs Assistance Bed Mobility: Supine to Sit     Supine to sit: Min guard     General bed mobility comments: pt agreeable to sit EOB, pt required increased time however able to perform without physical assist  Transfers                 General transfer comment: pt adamantly declined attempts to stand and/or transfer to recliner  Ambulation/Gait                 Stairs             Wheelchair Mobility    Modified Rankin (Stroke Patients Only)       Balance                                            Cognition Arousal/Alertness: Awake/alert Behavior During Therapy: WFL for tasks assessed/performed Overall Cognitive Status: Within Functional Limits for tasks assessed                                        Exercises      General Comments        Pertinent Vitals/Pain Pain Assessment: 0-10 Pain Score: 10-Worst pain ever Pain Location: right leg and foot Pain Descriptors / Indicators: Stabbing;Discomfort Pain Intervention(s): Monitored during session;Limited activity  within patient's tolerance;Repositioned    Home Living                      Prior Function            PT Goals (current goals can now be found in the care plan section) Progress towards PT goals: Not progressing toward goals - comment(pt self limiting with mobility)    Frequency    Min 2X/week      PT Plan Current plan remains appropriate    Co-evaluation              AM-PAC PT "6 Clicks" Daily Activity  Outcome Measure  Difficulty turning over in bed (including adjusting bedclothes, sheets and blankets)?: A Little Difficulty moving from lying on back to sitting on the side of the bed? : A Little Difficulty sitting down on and standing up from a chair with arms (e.g., wheelchair, bedside commode, etc,.)?: Unable Help needed moving to and from a bed to chair (including a wheelchair)?: A Lot Help needed walking in hospital room?: Total  Help needed climbing 3-5 steps with a railing? : Total 6 Click Score: 11    End of Session   Activity Tolerance: Other (comment)(pt self limiting) Patient left: with call bell/phone within reach;in bed;with bed alarm set(sitting EOB with lunch) Nurse Communication: Mobility status PT Visit Diagnosis: Other abnormalities of gait and mobility (R26.89)     Time: 6237-6283 PT Time Calculation (min) (ACUTE ONLY): 18 min  Charges:  $Therapeutic Activity: 8-22 mins                     Carmelia Bake, PT, DPT 11/29/2017 Pager: [phone]  York Ram E 11/29/2017, 5:11 PM

## 2017-11-29 NOTE — Progress Notes (Signed)
OT Cancellation Note  Patient Details Name: Robert Compton MRN: [id number] DOB: 09/17/1945   Cancelled Treatment:    Reason Eval/Treat Not Completed: Pain limiting ability to participate;Fatigue/lethargy limiting ability to participate   Will check back over the weekend as schedule permits. Dariyah Garduno 11/29/2017, 2:07 PM  Lesle Chris, OTR/L [phone] 11/29/2017

## 2017-11-29 NOTE — Progress Notes (Signed)
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  He states that he started having lower extremity and left upper extremity swelling on Sunday.  He presented to the emergency department.  In the emergency department, he has Dopplers were completed which were negative for DVT.  Patient was discharged home in stable condition.  He states that he returned due to continued swelling.  New events last 24 hours / Subjective: No new complaints.  Denies any shortness of breath.  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  Pancytopenia -Likely due to chemo -Transfused 2u pRBC 8/14  -Trend CBC, transfuse platelet < 10, Hgb < 7 -Monitor for bleeding   DM -SSI   Tobacco abuse -Nicotine patch   GERD -PPI    DVT prophylaxis: SCD Code Status: Full Family Communication: No family at bedside Disposition Plan: Pending improvement in edema   Consultants:   None  Procedures:   None   Antimicrobials:  Anti-infectives (From admission, onward)     None       Objective: Vitals:   11/28/17 1430 11/28/17 1724 11/28/17 2039 11/29/17 0501  BP: 136/75 128/72 127/66 137/68  Pulse: 98 (!) 101 (!) 105 (!) 106  Resp: 14 16 18 17   Temp: 98.2 F (36.8 C) 99.3 F (37.4 C) 98.1 F (36.7 C) 98.5 F (36.9 C)  TempSrc: Oral Oral Oral Oral  SpO2: 98% 94% 97% 90%  Weight:    (!) 146.5 kg  Height:        Intake/Output Summary (Last 24 hours) at 11/29/2017 1220 Last data filed at 11/29/2017 1039 Gross per 24 hour  Intake 240 ml  Output 1500 ml  Net -1260 ml   Filed Weights   11/27/17 0500 11/28/17 0337 11/29/17 0501  Weight: (!) 153.3 kg (!) 147.3 kg (!) 146.5 kg    Examination: General exam: Appears calm and comfortable  Respiratory system: Clear to auscultation. Respiratory effort normal. Cardiovascular system: S1 & S2 heard, tachycardic, regular rhythm. No JVD, murmurs, rubs, gallops or clicks. +bilateral pitting edema R>L  Gastrointestinal system: Abdomen is nondistended, soft and nontender. No organomegaly or masses felt. Normal bowel sounds heard. Central nervous system: Alert and oriented. No focal neurological deficits. Extremities: Symmetric 5 x 5 power. Psychiatry: Judgement and insight appear normal. Mood & affect appropriate.    Data Reviewed: I have personally reviewed following labs and imaging studies  CBC: Recent Labs  Lab 11/24/17 1817 11/26/17 1640 11/27/17 0901 11/28/17 0619 11/29/17 0729  WBC 12.1* 2.4* 2.1* 1.8* 2.5*  NEUTROABS 11.3* 1.5*  --   --   --  HGB 7.5* 6.6* 7.7* 7.5* 8.0*  HCT 22.6* 19.3* 22.7* 22.1* 23.6*  MCV 99.6 96.5 95.4 94.8 96.3  PLT 75* 35* 27* 24* 20*   Basic Metabolic Panel: Recent Labs  Lab 11/24/17 1817 11/26/17 1640 11/27/17 0901 11/28/17 0619 11/29/17 0609  NA 138 136 136 139 135  K 4.3 4.4 4.0 3.7 4.6  CL 104 100 101 101 97*  CO2 25 25 25 30 28   GLUCOSE 151* 137* 154* 157* 129*  BUN 14 14 12 10 9   CREATININE 0.79 0.94 0.66 0.74 0.68  CALCIUM 8.1* 8.1* 7.9*  8.2* 8.0*   GFR: Estimated Creatinine Clearance: 132.8 mL/min (by C-G formula based on SCr of 0.68 mg/dL). Liver Function Tests: Recent Labs  Lab 11/24/17 1817 11/26/17 1640 11/27/17 0901  AST 41 47* 39  ALT 29 33 32  ALKPHOS 122 139* 117  BILITOT 2.9* 2.8* 3.4*  PROT 5.5* 5.5* 5.2*  ALBUMIN 2.3* 2.4* 2.3*   No results for input(s): LIPASE, AMYLASE in the last 168 hours. No results for input(s): AMMONIA in the last 168 hours. Coagulation Profile: No results for input(s): INR, PROTIME in the last 168 hours. Cardiac Enzymes: Recent Labs  Lab 11/26/17 2114 11/27/17 0247 11/27/17 0901  TROPONINI 0.03* 0.03* <0.03   BNP (last 3 results) No results for input(s): PROBNP in the last 8760 hours. HbA1C: No results for input(s): HGBA1C in the last 72 hours. CBG: Recent Labs  Lab 11/28/17 1217 11/28/17 1727 11/28/17 2043 11/29/17 0724 11/29/17 1120  GLUCAP 125* 134* 170* 129* 137*   Lipid Profile: No results for input(s): CHOL, HDL, LDLCALC, TRIG, CHOLHDL, LDLDIRECT in the last 72 hours. Thyroid Function Tests: No results for input(s): TSH, T4TOTAL, FREET4, T3FREE, THYROIDAB in the last 72 hours. Anemia Panel: No results for input(s): VITAMINB12, FOLATE, FERRITIN, TIBC, IRON, RETICCTPCT in the last 72 hours. Sepsis Labs: Recent Labs  Lab 11/26/17 1647 11/26/17 1842  LATICACIDVEN 3.41* 2.32*    Recent Results (from the past 240 hour(s))  Blood culture (routine x 2)     Status: None (Preliminary result)   Collection Time: 11/27/17  5:51 AM  Result Value Ref Range Status   Specimen Description   Final    BLOOD LEFT ANTECUBITAL Performed at [facility] [address], [place], [place] [address]    Special Requests   Final    BOTTLES DRAWN AEROBIC AND ANAEROBIC Blood Culture adequate volume Performed at [facility] [address] [place], [place] [address]    Culture   Final    NO GROWTH 2 DAYS Performed at [facility] [address], [place], [place] [address]    Report Status PENDING  Incomplete  Blood culture (routine x 2)     Status: None (Preliminary result)   Collection Time: 11/27/17  9:01 AM  Result Value Ref Range Status   Specimen Description   Final    BLOOD RIGHT ARM Performed at [facility] [address], [place], [place] [address]    Special Requests   Final    BOTTLES DRAWN AEROBIC ONLY Blood Culture results may not be optimal due to an inadequate volume of blood received in culture bottles Performed at [facility] [address], [place], [place] [address]    Culture   Final    NO GROWTH 2 DAYS Performed at [facility] [address] [place],  [address]    Report Status PENDING  Incomplete  Radiology Studies: No results found.    Scheduled Meds: . feeding supplement (ENSURE ENLIVE)  237 mL Oral BID BM  . feeding supplement (PRO-STAT SUGAR FREE 64)  30 mL Oral BID  . fluticasone  2 spray Each Nare Daily  . furosemide  40 mg Intravenous BID  . insulin aspart  0-5 Units Subcutaneous QHS  . insulin aspart  0-9 Units Subcutaneous TID WC  . loratadine  10 mg Oral Daily  . nicotine  21 mg Transdermal Daily  . pantoprazole  40 mg Oral Daily  . sodium chloride flush  3 mL Intravenous Q12H   Continuous Infusions: . sodium chloride       LOS: 2 days    Time spent: 25 minutes   Dessa Phi, DO [facility] [email or web address] Password [email or web address] 11/29/2017, 12:20 PM

## 2017-11-30 LAB — CBC
HCT: 25.6 % — ABNORMAL LOW (ref 39.0–52.0)
Hemoglobin: 8.5 g/dL — ABNORMAL LOW (ref 13.0–17.0)
MCH: 32.4 pg (ref 26.0–34.0)
MCHC: 33.2 g/dL (ref 30.0–36.0)
MCV: 97.7 fL (ref 78.0–100.0)
Platelets: 23 10*3/uL — CL (ref 150–400)
RBC: 2.62 MIL/uL — ABNORMAL LOW (ref 4.22–5.81)
RDW: 17.6 % — AB (ref 11.5–15.5)
WBC: 7.5 10*3/uL (ref 4.0–10.5)

## 2017-11-30 LAB — GLUCOSE, CAPILLARY
GLUCOSE-CAPILLARY: 174 mg/dL — AB (ref 70–99)
GLUCOSE-CAPILLARY: 152 mg/dL — AB (ref 70–99)
Glucose-Capillary: 124 mg/dL — ABNORMAL HIGH (ref 70–99)
Glucose-Capillary: 177 mg/dL — ABNORMAL HIGH (ref 70–99)

## 2017-11-30 LAB — BASIC METABOLIC PANEL
ANION GAP: 7 (ref 5–15)
BUN: 10 mg/dL (ref 8–23)
CALCIUM: 8.2 mg/dL — AB (ref 8.9–10.3)
CO2: 32 mmol/L (ref 22–32)
Chloride: 97 mmol/L — ABNORMAL LOW (ref 98–111)
Creatinine, Ser: 0.82 mg/dL (ref 0.61–1.24)
GLUCOSE: 124 mg/dL — AB (ref 70–99)
Potassium: 3.3 mmol/L — ABNORMAL LOW (ref 3.5–5.1)
Sodium: 136 mmol/L (ref 135–145)

## 2017-11-30 MED ORDER — POTASSIUM CHLORIDE CRYS ER 20 MEQ PO TBCR
40.0000 meq | EXTENDED_RELEASE_TABLET | Freq: Four times a day (QID) | ORAL | Status: AC
  Administered 2017-11-30 (×2): 40 meq via ORAL
  Filled 2017-11-30 (×2): qty 2

## 2017-11-30 NOTE — Evaluation (Addendum)
Occupational Therapy Evaluation Patient Details Name: Robert Compton MRN: [id number] DOB: 29-Oct-1945 Today's Date: 11/30/2017    History of Present Illness 72 y.o. male with medical history significant of hypertension diabetes type 2, right TKR and  new diagnosis of metastatic cancer with liver mets admitted due to cellulitis  of right leg.   Clinical Impression   Met with pt lying supine in bed, somewhat agreeable to OT this date. Presents very self limiting. No deficits in BUE strength noted, roughly 5/5. Encouraged OOB activity, pt adamantly declined, including EOB mobility due to lunch coming and sciatica in his back (states you'd have to call 911 to get me out of that chair). Continued education, pt still adamantly declining. Pt presents with pitting edema in BLEs, dressing on RLE with reported blister on dorsal R foot. Educated pt on edema management with elevation and movement. Repositioned pt LEs from dependent position to elevated and heels floating, encouraged pt to continue this practice- in understanding. Encouraged ankle exercise and leg lifts while in the bed, pt completed x10. Told pt to continue these practice each TV commercial. Pt with tub bench, BSC, walker, and hospital bed at baseline (and currently using). Pt needing continued OT services to address increased engagement in BADL. Pt would benefit from SNF, would liekly decline, so recommendations are currently HHOT.    Follow Up Recommendations  Home health OT;Other (comment)(pt likely to decline SNF placement)    Equipment Recommendations  None recommended by OT    Recommendations for Other Services       Precautions / Restrictions Precautions Precautions: Fall Precaution Comments: dressing right foot Restrictions Weight Bearing Restrictions: No      Mobility Bed Mobility               General bed mobility comments: pt adamently deferred OOB/EOB mobility this date   Transfers                       Balance                                           ADL either performed or assessed with clinical judgement   ADL Overall ADL's : Needs assistance/impaired Eating/Feeding: Set up;Sitting   Grooming: Set up;Sitting   Upper Body Bathing: Set up;Sitting   Lower Body Bathing: Minimal assistance;Sit to/from stand   Upper Body Dressing : Set up;Sitting   Lower Body Dressing: Minimal assistance;Moderate assistance;Sit to/from stand   Toilet Transfer: Minimal assistance;BSC             General ADL Comments: pt is currently self limiting in ability to perform ADLs     Vision Baseline Vision/History: Wears glasses Wears Glasses: Reading only Patient Visual Report: No change from baseline       Perception     Praxis      Pertinent Vitals/Pain Pain Assessment: Faces Faces Pain Scale: Hurts a little bit Pain Location: reports increase pain with movement Pain Intervention(s): Limited activity within patient's tolerance;Monitored during session;Repositioned     Hand Dominance     Extremity/Trunk Assessment Upper Extremity Assessment Upper Extremity Assessment: Overall WFL for tasks assessed(grossly 5/5)   Lower Extremity Assessment Lower Extremity Assessment: Generalized weakness;RLE deficits/detail;LLE deficits/detail RLE Deficits / Details: dressing 2/2 reported blister on dorsal foot LLE Deficits / Details: reports discoloration/hematoma from black widow bite  Communication Communication Communication: No difficulties   Cognition Arousal/Alertness: Awake/alert Behavior During Therapy: WFL for tasks assessed/performed Overall Cognitive Status: Within Functional Limits for tasks assessed                                     General Comments       Exercises     Shoulder Instructions      Home Living Family/patient expects to be discharged to:: Private residence Living Arrangements: Children Available Help at  Discharge: Family;Available PRN/intermittently Type of Home: House Home Access: Stairs to enter [employer] of Steps: 2 Entrance Stairs-Rails: None Home Layout: One level     Bathroom Shower/Tub: Teacher, early years/pre: Handicapped height     Home Equipment: Environmental consultant - 2 wheels;Tub bench;Hospital bed          Prior Functioning/Environment Level of Independence: Needs assistance    ADL's / Homemaking Assistance Needed: reports on helps pt with some ADLs and IADL tasks at baseline            OT Problem List: Pain;Decreased activity tolerance      OT Treatment/Interventions: Self-care/ADL training;DME and/or AE instruction;Energy conservation;Patient/family education    OT Goals(Current goals can be found in the care plan section) Acute Rehab OT Goals Patient Stated Goal: to go home as soon as possible, take care of blister OT Goal Formulation: With patient Time For Goal Achievement: 12/14/17 Potential to Achieve Goals: Fair  OT Frequency: Min 2X/week   Barriers to D/C:            Co-evaluation              AM-PAC PT "6 Clicks" Daily Activity     Outcome Measure Help from another person eating meals?: None Help from another person taking care of personal grooming?: None Help from another person toileting, which includes using toliet, bedpan, or urinal?: A Lot Help from another person bathing (including washing, rinsing, drying)?: A Little Help from another person to put on and taking off regular upper body clothing?: None Help from another person to put on and taking off regular lower body clothing?: A Little 6 Click Score: 20   End of Session    Activity Tolerance: Other (comment);Patient tolerated treatment well(self limiting) Patient left: in bed;with call bell/phone within reach;with bed alarm set  OT Visit Diagnosis: Other abnormalities of gait and mobility (R26.89)                Time: 3382-5053 OT Time Calculation (min): 10  min Charges:  OT General Charges $OT Visit: 1 Visit OT Evaluation $OT Eval Moderate Complexity: [place], MSOT, OTR/L  [place] 11/30/2017, 2:44 PM

## 2017-11-30 NOTE — Progress Notes (Signed)
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  He states that he started having lower extremity and left upper extremity swelling on Sunday.  He presented to the emergency department.  In the emergency department, he has Dopplers were completed which were negative for DVT.  Patient was discharged home in stable condition.  He states that he returned due to continued swelling.  New events last 24 hours / Subjective: No new issues overnight.  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 Julien Compton  Pancytopenia -Likely due to chemo -Transfused 2u pRBC 8/14  -Trend CBC, transfuse platelet < 10, Hgb < 7 -Monitor for bleeding   DM -SSI   Tobacco abuse -Nicotine patch   GERD -PPI   Hypokalemia -Replace, trend   DVT prophylaxis: SCD Code Status: Full Family Communication: No family at bedside Disposition Plan: Pending improvement in edema   Consultants:   None  Procedures:   None   Antimicrobials:  Anti-infectives (From admission,  onward)   None       Objective: Vitals:   11/29/17 1407 11/29/17 2123 11/30/17 0500 11/30/17 0516  BP: (!) 104/56 118/64  118/70  Pulse: (!) 102 100  (!) 103  Resp:  (!) 22  (!) 22  Temp: 98.8 F (37.1 C) 98.2 F (36.8 C)  98.3 F (36.8 C)  TempSrc: Oral     SpO2: 91% 91%  90%  Weight:   (!) 143.1 kg   Height:        Intake/Output Summary (Last 24 hours) at 11/30/2017 1139 Last data filed at 11/30/2017 1114 Gross per 24 hour  Intake 600 ml  Output 4040 ml  Net -3440 ml   Filed Weights   11/28/17 0337 11/29/17 0501 11/30/17 0500  Weight: (!) 147.3 kg (!) 146.5 kg (!) 143.1 kg    Examination: General exam: Appears calm and comfortable  Respiratory system: Clear to auscultation. Respiratory effort normal. Cardiovascular system: S1 & S2 heard, RRR. No JVD, murmurs, rubs, gallops or clicks. +Bilateral pitting edema R>L  Gastrointestinal system: Abdomen is nondistended, soft and nontender. No organomegaly or masses felt. Normal bowel sounds heard. Central nervous system: Alert and oriented. No focal neurological deficits. Extremities: Symmetric 5 x 5 power. Psychiatry: Judgement and insight appear normal. Mood & affect appropriate.    Data Reviewed: I have personally reviewed following labs and imaging studies  CBC: Recent Labs  Lab 11/24/17 1817 11/26/17 1640 11/27/17 0901 11/28/17 0619 11/29/17 0729 11/30/17 0535  WBC 12.1* 2.4* 2.1* 1.8* 2.5* 7.5  NEUTROABS 11.3* 1.5*  --   --   --   --  HGB 7.5* 6.6* 7.7* 7.5* 8.0* 8.5*  HCT 22.6* 19.3* 22.7* 22.1* 23.6* 25.6*  MCV 99.6 96.5 95.4 94.8 96.3 97.7  PLT 75* 35* 27* 24* 20* 23*   Basic Metabolic Panel: Recent Labs  Lab 11/26/17 1640 11/27/17 0901 11/28/17 0619 11/29/17 0609 11/30/17 0535  NA 136 136 139 135 136  K 4.4 4.0 3.7 4.6 3.3*  CL 100 101 101 97* 97*  CO2 25 25 30 28  32  GLUCOSE 137* 154* 157* 129* 124*  BUN 14 12 10 9 10   CREATININE 0.94 0.66 0.74 0.68 0.82  CALCIUM 8.1* 7.9* 8.2* 8.0*  8.2*   GFR: Estimated Creatinine Clearance: 127.9 mL/min (by C-G formula based on SCr of 0.82 mg/dL). Liver Function Tests: Recent Labs  Lab 11/24/17 1817 11/26/17 1640 11/27/17 0901  AST 41 47* 39  ALT 29 33 32  ALKPHOS 122 139* 117  BILITOT 2.9* 2.8* 3.4*  PROT 5.5* 5.5* 5.2*  ALBUMIN 2.3* 2.4* 2.3*   No results for input(s): LIPASE, AMYLASE in the last 168 hours. No results for input(s): AMMONIA in the last 168 hours. Coagulation Profile: No results for input(s): INR, PROTIME in the last 168 hours. Cardiac Enzymes: Recent Labs  Lab 11/26/17 2114 11/27/17 0247 11/27/17 0901  TROPONINI 0.03* 0.03* <0.03   BNP (last 3 results) No results for input(s): PROBNP in the last 8760 hours. HbA1C: No results for input(s): HGBA1C in the last 72 hours. CBG: Recent Labs  Lab 11/29/17 0724 11/29/17 1120 11/29/17 1651 11/29/17 2125 11/30/17 0745  GLUCAP 129* 137* 135* 124* 124*   Lipid Profile: No results for input(s): CHOL, HDL, LDLCALC, TRIG, CHOLHDL, LDLDIRECT in the last 72 hours. Thyroid Function Tests: No results for input(s): TSH, T4TOTAL, FREET4, T3FREE, THYROIDAB in the last 72 hours. Anemia Panel: No results for input(s): VITAMINB12, FOLATE, FERRITIN, TIBC, IRON, RETICCTPCT in the last 72 hours. Sepsis Labs: Recent Labs  Lab 11/26/17 1647 11/26/17 1842  LATICACIDVEN 3.41* 2.32*    Recent Results (from the past 240 hour(s))  Blood culture (routine x 2)     Status: None (Preliminary result)   Collection Time: 11/27/17  5:51 AM  Result Value Ref Range Status   Specimen Description   Final    BLOOD LEFT ANTECUBITAL Performed at [facility] [address], [place], [place] [address]    Special Requests   Final    BOTTLES DRAWN AEROBIC AND ANAEROBIC Blood Culture adequate volume Performed at [facility] [address], [place], [place] [address]    Culture   Final    NO GROWTH 2 DAYS Performed at [facility] [address] [place], [place] [address]    Report Status PENDING  Incomplete  Blood culture (routine x 2)     Status: None (Preliminary result)   Collection Time: 11/27/17  9:01 AM  Result Value Ref Range Status   Specimen Description   Final    BLOOD RIGHT ARM Performed at [facility] [address], [place], [place] [address]    Special Requests   Final    BOTTLES DRAWN AEROBIC ONLY Blood Culture results may not be optimal due to an inadequate volume of blood received in culture bottles Performed at [facility] [address], [place], [place] [address]    Culture   Final    NO GROWTH 2 DAYS Performed at [facility] [address], [place], [place] [address]    Report  Status PENDING  Incomplete       Radiology Studies: No results found.    Scheduled Meds: . feeding supplement (ENSURE ENLIVE)  237 mL Oral BID BM  . feeding supplement (PRO-STAT SUGAR FREE 64)  30 mL Oral BID  . fluticasone  2 spray Each Nare Daily  . furosemide  40 mg Intravenous BID  . insulin aspart  0-5 Units Subcutaneous QHS  . insulin aspart  0-9 Units Subcutaneous TID WC  . loratadine  10 mg Oral Daily  . nicotine  21 mg Transdermal Daily  . pantoprazole  40 mg Oral Daily  . potassium chloride  40 mEq Oral Q6H  . sodium chloride flush  3 mL Intravenous Q12H   Continuous Infusions: . sodium chloride       LOS: 3 days    Time spent: 25 minutes   Dessa Phi, DO [facility] [email or web address] Password [facility] 11/30/2017, 11:39 AM

## 2017-12-01 LAB — BASIC METABOLIC PANEL
ANION GAP: 9 (ref 5–15)
BUN: 12 mg/dL (ref 8–23)
CALCIUM: 7.9 mg/dL — AB (ref 8.9–10.3)
CO2: 31 mmol/L (ref 22–32)
CREATININE: 0.83 mg/dL (ref 0.61–1.24)
Chloride: 96 mmol/L — ABNORMAL LOW (ref 98–111)
GFR calc Af Amer: >60 mL/min (ref >60)
GLUCOSE: 136 mg/dL — AB (ref 70–99)
Potassium: 3.5 mmol/L (ref 3.5–5.1)
Sodium: 136 mmol/L (ref 135–145)

## 2017-12-01 LAB — CBC
HCT: 25.6 % — ABNORMAL LOW (ref 39.0–52.0)
Hemoglobin: 8.5 g/dL — ABNORMAL LOW (ref 13.0–17.0)
MCH: 32.4 pg (ref 26.0–34.0)
MCHC: 33.2 g/dL (ref 30.0–36.0)
MCV: 97.7 fL (ref 78.0–100.0)
PLATELETS: 33 10*3/uL — AB (ref 150–400)
RBC: 2.62 MIL/uL — ABNORMAL LOW (ref 4.22–5.81)
RDW: 18 % — AB (ref 11.5–15.5)
WBC: 16.4 10*3/uL — AB (ref 4.0–10.5)

## 2017-12-01 LAB — GLUCOSE, CAPILLARY
Glucose-Capillary: 120 mg/dL — ABNORMAL HIGH (ref 70–99)
Glucose-Capillary: 158 mg/dL — ABNORMAL HIGH (ref 70–99)
Glucose-Capillary: 170 mg/dL — ABNORMAL HIGH (ref 70–99)
Glucose-Capillary: 226 mg/dL — ABNORMAL HIGH (ref 70–99)

## 2017-12-01 MED ORDER — ALUM & MAG HYDROXIDE-SIMETH 200-200-20 MG/5ML PO SUSP
30.0000 mL | Freq: Four times a day (QID) | ORAL | Status: DC | PRN
  Administered 2017-12-01 (×2): 30 mL via ORAL
  Filled 2017-12-01 (×2): qty 30

## 2017-12-01 NOTE — Progress Notes (Addendum)
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  He states that he started having lower extremity and left upper extremity swelling on Sunday.  He presented to the emergency department.  In the emergency department, he has Dopplers were completed which were negative for DVT.  Patient was discharged home in stable condition.  He states that he returned due to continued swelling.  New events last 24 hours / Subjective: Doing fair, no new issues or complaints. Swelling improved.   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  Pancytopenia -Likely due to chemo -Transfused 2u pRBC 8/14  -Trend CBC, transfuse platelet < 10, Hgb < 7 -Monitor for bleeding  -?WBC 16.4 increase today, unclear etiology, no sign of infection, no fever. Repeat CBC in AM  DM -SSI   Tobacco abuse -Nicotine patch   GERD -PPI   Hypokalemia -Replace, trend   DVT prophylaxis: SCD Code Status: Full Family Communication: No family at bedside Disposition Plan:  Pending improvement in edema, home with home health services, hopeful discharge next 24-48 hours    Consultants:   None  Procedures:   None   Antimicrobials:  Anti-infectives (From admission, onward)   None       Objective: Vitals:   11/30/17 1512 11/30/17 1944 12/01/17 0403 12/01/17 0500  BP: 102/61 118/66 120/75   Pulse: 96 91 97   Resp: 18 19 17    Temp: 97.7 F (36.5 C) 98.3 F (36.8 C) 98.6 F (37 C)   TempSrc: Oral Oral Oral   SpO2: 92% 97% 94%   Weight:    (!) 140.1 kg  Height:        Intake/Output Summary (Last 24 hours) at 12/01/2017 0954 Last data filed at 12/01/2017 0859 Gross per 24 hour  Intake 600 ml  Output 3475 ml  Net -2875 ml   Filed Weights   11/29/17 0501 11/30/17 0500 12/01/17 0500  Weight: (!) 146.5 kg (!) 143.1 kg (!) 140.1 kg    Examination: General exam: Appears calm and comfortable  Respiratory system: Clear to auscultation. Respiratory effort normal. Cardiovascular system: S1 & S2 heard, RRR. No JVD, murmurs, rubs, gallops or clicks. +nonpitting edema LLE, +1 pitting edema RLE, overall edema improved since admission  Gastrointestinal system: Abdomen is nondistended, soft and nontender. No organomegaly or masses felt. Normal bowel sounds heard. Central nervous system: Alert and oriented. No focal neurological deficits. Extremities: Symmetric 5 x 5 power. Skin: No rashes, lesions or ulcers Psychiatry: Judgement and insight appear normal. Mood & affect appropriate.    Data Reviewed: I have  personally reviewed following labs and imaging studies  CBC: Recent Labs  Lab 11/24/17 1817 11/26/17 1640 11/27/17 0901 11/28/17 0619 11/29/17 0729 11/30/17 0535 12/01/17 0533  WBC 12.1* 2.4* 2.1* 1.8* 2.5* 7.5 16.4*  NEUTROABS 11.3* 1.5*  --   --   --   --   --   HGB 7.5* 6.6* 7.7* 7.5* 8.0* 8.5* 8.5*  HCT 22.6* 19.3* 22.7* 22.1* 23.6* 25.6* 25.6*  MCV 99.6 96.5 95.4 94.8 96.3 97.7 97.7  PLT 75* 35* 27* 24* 20* 23* 33*   Basic  Metabolic Panel: Recent Labs  Lab 11/27/17 0901 11/28/17 0619 11/29/17 0609 11/30/17 0535 12/01/17 0533  NA 136 139 135 136 136  K 4.0 3.7 4.6 3.3* 3.5  CL 101 101 97* 97* 96*  CO2 25 30 28  32 31  GLUCOSE 154* 157* 129* 124* 136*  BUN 12 10 9 10 12   CREATININE 0.66 0.74 0.68 0.82 0.83  CALCIUM 7.9* 8.2* 8.0* 8.2* 7.9*   GFR: Estimated Creatinine Clearance: 125 mL/min (by C-G formula based on SCr of 0.83 mg/dL). Liver Function Tests: Recent Labs  Lab 11/24/17 1817 11/26/17 1640 11/27/17 0901  AST 41 47* 39  ALT 29 33 32  ALKPHOS 122 139* 117  BILITOT 2.9* 2.8* 3.4*  PROT 5.5* 5.5* 5.2*  ALBUMIN 2.3* 2.4* 2.3*   No results for input(s): LIPASE, AMYLASE in the last 168 hours. No results for input(s): AMMONIA in the last 168 hours. Coagulation Profile: No results for input(s): INR, PROTIME in the last 168 hours. Cardiac Enzymes: Recent Labs  Lab 11/26/17 2114 11/27/17 0247 11/27/17 0901  TROPONINI 0.03* 0.03* <0.03   BNP (last 3 results) No results for input(s): PROBNP in the last 8760 hours. HbA1C: No results for input(s): HGBA1C in the last 72 hours. CBG: Recent Labs  Lab 11/30/17 0745 11/30/17 1208 11/30/17 1730 11/30/17 1948 12/01/17 0755  GLUCAP 124* 174* 177* 152* 120*   Lipid Profile: No results for input(s): CHOL, HDL, LDLCALC, TRIG, CHOLHDL, LDLDIRECT in the last 72 hours. Thyroid Function Tests: No results for input(s): TSH, T4TOTAL, FREET4, T3FREE, THYROIDAB in the last 72 hours. Anemia Panel: No results for input(s): VITAMINB12, FOLATE, FERRITIN, TIBC, IRON, RETICCTPCT in the last 72 hours. Sepsis Labs: Recent Labs  Lab 11/26/17 1647 11/26/17 1842  LATICACIDVEN 3.41* 2.32*    Recent Results (from the past 240 hour(s))  Blood culture (routine x 2)     Status: None (Preliminary result)   Collection Time: 11/27/17  5:51 AM  Result Value Ref Range Status   Specimen Description   Final    BLOOD LEFT ANTECUBITAL Performed at [facility] [address], [place], [place] [address]    Special Requests   Final    BOTTLES DRAWN AEROBIC AND ANAEROBIC Blood Culture adequate volume Performed at [facility] [address] [place], [place] [address]    Culture   Final    NO GROWTH 3 DAYS Performed at [facility], [place] [address], [place], [place] [address]    Report Status PENDING  Incomplete  Blood culture (routine x 2)     Status: None (Preliminary result)   Collection Time: 11/27/17  9:01 AM  Result Value Ref Range Status   Specimen Description   Final    BLOOD RIGHT ARM Performed at [facility] [address], [place], [place] [address]    Special Requests   Final    BOTTLES DRAWN AEROBIC ONLY Blood Culture  results may not be optimal due to an inadequate volume of blood received in culture bottles Performed at [facility], [place] [address] [place], [place] [address]    Culture   Final    NO GROWTH 3 DAYS Performed at [facility], [place] [address], [place], [place] [address]    Report Status PENDING  Incomplete       Radiology Studies: No results found.    Scheduled Meds: . feeding supplement (ENSURE ENLIVE)  237 mL Oral BID BM  . feeding supplement (PRO-STAT SUGAR FREE 64)  30 mL Oral BID  . fluticasone  2 spray Each Nare Daily  . furosemide  40 mg Intravenous BID  . insulin aspart  0-5 Units Subcutaneous QHS  . insulin aspart  0-9 Units Subcutaneous TID WC  . loratadine  10 mg Oral Daily  . nicotine  21 mg Transdermal Daily  . pantoprazole  40 mg Oral Daily  . sodium chloride flush  3 mL Intravenous Q12H   Continuous Infusions: . sodium chloride       LOS: 4 days    Time spent: 25 minutes   Dessa Phi, DO [facility] [email or web address] Password [facility] 12/01/2017, 9:54 AM

## 2017-12-01 NOTE — Progress Notes (Signed)
Alert MD via text that pt just had a 10 beat run of VTach. Pt sleeping.

## 2017-12-02 ENCOUNTER — Ambulatory Visit (HOSPITAL_COMMUNITY): Payer: BLUE CROSS/BLUE SHIELD

## 2017-12-02 ENCOUNTER — Telehealth: Payer: Self-pay | Admitting: Medical Oncology

## 2017-12-02 ENCOUNTER — Other Ambulatory Visit: Payer: BLUE CROSS/BLUE SHIELD

## 2017-12-02 ENCOUNTER — Inpatient Hospital Stay (HOSPITAL_COMMUNITY): Admission: RE | Admit: 2017-12-02 | Payer: BLUE CROSS/BLUE SHIELD | Source: Ambulatory Visit

## 2017-12-02 DIAGNOSIS — R0602 Shortness of breath: Secondary | ICD-10-CM | POA: Insufficient documentation

## 2017-12-02 DIAGNOSIS — I110 Hypertensive heart disease with heart failure: Secondary | ICD-10-CM | POA: Insufficient documentation

## 2017-12-02 DIAGNOSIS — Z96651 Presence of right artificial knee joint: Secondary | ICD-10-CM | POA: Insufficient documentation

## 2017-12-02 DIAGNOSIS — M7989 Other specified soft tissue disorders: Secondary | ICD-10-CM | POA: Insufficient documentation

## 2017-12-02 DIAGNOSIS — E119 Type 2 diabetes mellitus without complications: Secondary | ICD-10-CM | POA: Insufficient documentation

## 2017-12-02 DIAGNOSIS — R140 Abdominal distension (gaseous): Secondary | ICD-10-CM | POA: Insufficient documentation

## 2017-12-02 DIAGNOSIS — Z794 Long term (current) use of insulin: Secondary | ICD-10-CM | POA: Insufficient documentation

## 2017-12-02 DIAGNOSIS — R509 Fever, unspecified: Secondary | ICD-10-CM | POA: Insufficient documentation

## 2017-12-02 DIAGNOSIS — I5032 Chronic diastolic (congestive) heart failure: Secondary | ICD-10-CM | POA: Insufficient documentation

## 2017-12-02 DIAGNOSIS — R339 Retention of urine, unspecified: Secondary | ICD-10-CM | POA: Insufficient documentation

## 2017-12-02 DIAGNOSIS — Z87891 Personal history of nicotine dependence: Secondary | ICD-10-CM | POA: Insufficient documentation

## 2017-12-02 DIAGNOSIS — Z79899 Other long term (current) drug therapy: Secondary | ICD-10-CM | POA: Insufficient documentation

## 2017-12-02 DIAGNOSIS — C787 Secondary malignant neoplasm of liver and intrahepatic bile duct: Secondary | ICD-10-CM | POA: Insufficient documentation

## 2017-12-02 DIAGNOSIS — C3411 Malignant neoplasm of upper lobe, right bronchus or lung: Secondary | ICD-10-CM | POA: Insufficient documentation

## 2017-12-02 LAB — CBC
HEMATOCRIT: 25.6 % — AB (ref 39.0–52.0)
Hemoglobin: 8.6 g/dL — ABNORMAL LOW (ref 13.0–17.0)
MCH: 33.1 pg (ref 26.0–34.0)
MCHC: 33.6 g/dL (ref 30.0–36.0)
MCV: 98.5 fL (ref 78.0–100.0)
Platelets: 50 10*3/uL — ABNORMAL LOW (ref 150–400)
RBC: 2.6 MIL/uL — ABNORMAL LOW (ref 4.22–5.81)
RDW: 18.5 % — AB (ref 11.5–15.5)
WBC: 23.8 10*3/uL — ABNORMAL HIGH (ref 4.0–10.5)

## 2017-12-02 LAB — BASIC METABOLIC PANEL
Anion gap: 9 (ref 5–15)
BUN: 12 mg/dL (ref 8–23)
CALCIUM: 8 mg/dL — AB (ref 8.9–10.3)
CHLORIDE: 96 mmol/L — AB (ref 98–111)
CO2: 31 mmol/L (ref 22–32)
CREATININE: 0.84 mg/dL (ref 0.61–1.24)
GFR calc non Af Amer: >60 mL/min (ref >60)
GLUCOSE: 129 mg/dL — AB (ref 70–99)
Potassium: 3.4 mmol/L — ABNORMAL LOW (ref 3.5–5.1)
Sodium: 136 mmol/L (ref 135–145)

## 2017-12-02 LAB — GLUCOSE, CAPILLARY
GLUCOSE-CAPILLARY: 206 mg/dL — AB (ref 70–99)
Glucose-Capillary: 124 mg/dL — ABNORMAL HIGH (ref 70–99)

## 2017-12-02 LAB — CULTURE, BLOOD (ROUTINE X 2)
Culture: NO GROWTH
Culture: NO GROWTH
Special Requests: ADEQUATE

## 2017-12-02 MED ORDER — POTASSIUM CHLORIDE CRYS ER 20 MEQ PO TBCR
40.0000 meq | EXTENDED_RELEASE_TABLET | Freq: Once | ORAL | Status: AC
  Administered 2017-12-02: 40 meq via ORAL
  Filled 2017-12-02: qty 2

## 2017-12-02 MED ORDER — FUROSEMIDE 20 MG PO TABS: 40.0000 mg | ORAL_TABLET | Freq: Two times a day (BID) | ORAL | 0 refills | Status: DC

## 2017-12-02 MED ORDER — POTASSIUM CHLORIDE ER 20 MEQ PO TBCR: 20.0000 meq | EXTENDED_RELEASE_TABLET | Freq: Two times a day (BID) | ORAL | 0 refills | Status: DC

## 2017-12-02 NOTE — Discharge Summary (Addendum)
Physician Discharge Summary  Robert Compton PYP:[id number] DOB: 1945/04/28 DOA: 11/26/2017  PCP: [name], MD  Admit date: 11/26/2017 Discharge date: 12/02/2017  Admitted From: Home Disposition:  Home, patient declined SNF which was recommended   Recommendations for Outpatient Follow-up:  1. Follow up with PCP in 1 week 2. Follow up with [name] on 8/26 as scheduled  3. Please obtain BMP/CBC in 1 week  4. 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  He states that he started having lower extremity and left upper extremity swelling on Sunday.  He presented to the emergency department.  In the emergency department, he has Dopplers were completed which were negative for DVT.  Patient was discharged home in stable condition.  He states that he returned due to continued swelling. Echocardiogram from June 2019 did reveal grade 1 diastolic dysfunction. He was diuresed with IV lasix 40mg  BID which good diuresis. He had 42 lb weight loss and 9.8 L net output throughout hospitalization.   Discharge Diagnoses:  Principal Problem:   Acute on chronic diastolic (congestive) heart failure (HCC) Active Problems:   Extensive stage primary small cell carcinoma of lung (HCC)   Tachycardia   Symptomatic anemia   Leg edema, right   Acute on chronic diastolic heart failure -Echocardiogram in June 2019 revealed EF 50 to 24%, grade 1 diastolic dysfunction -BNP mildly elevated 116.9 -Patient presents with bilateral lower extremity pitting edema with exertional  dyspnea -Bilateral lower extremity venous Doppler 4 days prior to admission was negative for DVT -Treated with IV Lasix 40 mg twice daily --> transition to PO on discharge  -Strict I's and O's;  -9.8 L net output since admission -Daily weight; -42 lb since admission   Metastatic small cell lung cancer -Follows with Dr. Ernst Bowler with leukocytosis  -Likely due to chemo -Transfused 2u pRBC 8/14  -Trend CBC, transfuse platelet < 10, Hgb < 7 -Monitor for bleeding  -Patient received Udenyca on 8/9 which likely explains his leukocytosis. Follow up with [name] for repeat lab. No source of infection and no fevers   DM -SSI   Tobacco abuse -Nicotine patch   GERD -PPI   Hypokalemia -Replace, trend  Discharge Instructions  Discharge Instructions    (HEART FAILURE PATIENTS) Call MD:  Anytime you have any of the following symptoms: 1) 3 pound weight gain in 24 hours or 5 pounds in 1 week 2) shortness of breath, with or without a dry hacking cough 3) swelling in the hands, feet or stomach 4) if you have to sleep on extra pillows at night in order to breathe.   Complete by:  As directed    Call MD for:   Complete by:  As directed    Worsening lower leg swelling   Call MD for:  difficulty breathing, headache or visual disturbances   Complete by:  As directed    Call MD for:  extreme fatigue   Complete by:  As directed    Call MD for:  hives   Complete by:  As directed    Call MD for:  persistant dizziness or light-headedness   Complete by:  As directed    Call MD for:  persistant nausea and vomiting   Complete by:  As directed    Call MD for:  severe uncontrolled pain   Complete by:  As directed    Call MD for:  temperature >100.4   Complete by:  As directed    Diet - low sodium heart healthy   Complete by:  As directed    Discharge instructions   Complete by:  As directed    You were cared for by a hospitalist during your hospital stay. If you have any  questions about your discharge medications or the care you received while you were in the hospital after you are discharged, you can call the unit and ask to speak with the hospitalist on call if the hospitalist that took care of you is not available. Once you are discharged, your primary care physician will handle any further medical issues. Please note that NO REFILLS for any discharge medications will be authorized once you are discharged, as it is imperative that you return to your primary care physician (or establish a relationship with a primary care physician if you do not have one) for your aftercare needs so that they can reassess your need for medications and monitor your lab values.   Discharge wound care:   Complete by:  As directed    Keep wound on right lower leg clean and dry   Increase activity slowly   Complete by:  As directed      Allergies as of 12/02/2017      Reactions   Amoxicillin-pot Clavulanate Anaphylaxis   Has patient had a PCN reaction causing immediate rash, facial/tongue/throat swelling, SOB or lightheadedness with hypotension: Yes Has patient had a PCN reaction causing severe rash involving mucus membranes or skin necrosis: Yes Has patient had a PCN reaction that required hospitalization: Yes Has patient had a PCN reaction occurring within the last 10 years: Yes If all of the above answers are "NO", then may proceed with Cephalosporin use.   Nitrofurantoin Anaphylaxis      Medication List    STOP taking these medications   levofloxacin 750 MG tablet Commonly known as:  LEVAQUIN     TAKE these medications   CINNAMON PO Take 600 mg by mouth 2 (two) times daily.   fish oil-omega-3 fatty acids 1000 MG capsule Take 1 g by mouth 2 (two) times daily.   fluticasone 50 MCG/ACT nasal spray Commonly known as:  FLONASE Place 2 sprays into both nostrils daily as needed for allergies.   furosemide 20 MG tablet Commonly known as:  LASIX Take 2 tablets (40 mg  total) by mouth 2 (two) times daily. What changed:    how much to take  when to take this   insulin aspart 100 UNIT/ML injection Commonly known as:  novoLOG CBG < 70: implement hypoglycemia protocol CBG 70 - 120: 0 units CBG 121 - 150: 2 units CBG 151 - 200: 3 units CBG 201 - 250: 5 units CBG 251 - 300: 8 units CBG 301 - 350: 11 units CBG 351 - 400: 15 units CBG > 400: call MD   loratadine 10 MG tablet Commonly known as:  CLARITIN Take 1 tablet (10 mg total) by mouth daily.   metFORMIN 1000 MG tablet Commonly known as:  GLUCOPHAGE Take 1,000 mg by mouth 2 (two) times daily with a meal.   nicotine 21 mg/24hr patch Commonly  known as:  NICODERM CQ - dosed in mg/24 hours Place 1 patch (21 mg total) onto the skin daily.   omeprazole 20 MG capsule Commonly known as:  PRILOSEC Take 20 mg by mouth daily.   polyethylene glycol packet Commonly known as:  MIRALAX / GLYCOLAX Take 17 g by mouth daily.   Potassium Chloride ER 20 MEQ Tbcr Take 20 mEq by mouth 2 (two) times daily.   Red Yeast Rice 600 MG Tabs Take 1 tablet by mouth 2 (two) times daily.   traMADol 50 MG tablet Commonly known as:  ULTRAM Take 1 tablet (50 mg total) by mouth every 6 (six) hours as needed for moderate pain. What changed:  when to take this   triamcinolone cream 0.1 % Commonly known as:  KENALOG Apply 1 application topically daily as needed for wound care.            Discharge Care Instructions  (From admission, onward)         Start     Ordered   12/02/17 0000  Discharge wound care:    Comments:  Keep wound on right lower leg clean and dry   12/02/17 1032         Follow-up Information    Avva, Ravisankar, MD. Schedule an appointment as soon as possible for a visit in 1 week(s).   Specialty:  Internal Medicine Contact information: [place] [address] [phone]        Curt Bears, MD. Go on 12/09/2017.   Specialty:  Oncology Contact information: [address] [place] [address] [phone]          Allergies  Allergen Reactions  . Amoxicillin-Pot Clavulanate Anaphylaxis    Has patient had a PCN reaction causing immediate rash, facial/tongue/throat swelling, SOB or lightheadedness with hypotension: Yes Has patient had a PCN reaction causing severe rash involving mucus membranes or skin necrosis: Yes Has patient had a PCN reaction that required hospitalization: Yes Has patient had a PCN reaction occurring within the last 10 years: Yes If all of the above answers are "NO", then may proceed with Cephalosporin use.   . Nitrofurantoin Anaphylaxis    Consultations:  None   Procedures/Studies: Ct Angio Chest Pe W Or Wo Contrast  Result Date: 11/26/2017 CLINICAL DATA:  Shortness of breath. History of small cell lung cancer, currently on chemotherapy. EXAM: CT ANGIOGRAPHY CHEST WITH CONTRAST TECHNIQUE: Multidetector CT imaging of the chest was performed using the standard protocol during bolus administration of intravenous contrast. Multiplanar CT image reconstructions and MIPs were obtained to evaluate the vascular anatomy. CONTRAST:  171mL ISOVUE-370 IOPAMIDOL (ISOVUE-370) INJECTION 76% COMPARISON:  CT chest dated November 19, 2017. FINDINGS: Cardiovascular: Suboptimal opacification of the pulmonary arteries due to bolus timing. No central or lobar pulmonary embolism. The segmental pulmonary arteries are not well evaluated. Normal heart size. No pericardial effusion. Normal caliber thoracic aorta. Coronary, aortic arch, and branch vessel atherosclerotic vascular disease. Mediastinum/Nodes: Unchanged enlarged 1.2 cm and 2.7 cm right paratracheal lymph nodes. Unchanged 9 mm right periaortic lymph node. No hilar or axillary lymphadenopathy. The thyroid gland, trachea, and esophagus are unremarkable. Lungs/Pleura: Unchanged 8 mm irregular nodule within the right lung apex. Increased linear atelectasis in the right lung apex. One 6 mm  nodule along the minor fissure is unchanged in size. The other previously seen nodule along the minor fissure is no longer identified. No new pulmonary nodule. Slight interval increase in size of small right pleural effusion with adjacent  right lower lobe subsegmental atelectasis. Unchanged small left pleural effusion. No consolidation or pneumothorax. Upper Abdomen: Nodular liver contour suggestive of cirrhosis. Hypodense metastases within the posterior right hepatic lobe are not as well evaluated on today's study, but appear grossly unchanged. Small ascites, unchanged. Musculoskeletal: Unchanged subcentimeter lucent lesions in the T5 and T9 vertebral bodies. No acute osseous finding. Review of the MIP images confirms the above findings. IMPRESSION: 1. Suboptimal opacification of the pulmonary arteries. No central or lobar pulmonary embolism. 2. Slight interval increase in size of small right pleural effusion. Unchanged small left pleural effusion. 3. Unchanged right apical lung cancer. Stable mediastinal adenopathy and hepatic metastases. 4. Nodular liver contour, suggestive of cirrhosis. Unchanged small ascites. 5.  Aortic atherosclerosis (ICD10-I70.0). Electronically Signed   By: Titus Dubin M.D.   On: 11/26/2017 18:55   Dg Chest Port 1 View  Result Date: 11/26/2017 CLINICAL DATA:  Short of breath EXAM: PORTABLE CHEST 1 VIEW COMPARISON:  11/24/2017, 11/08/2017, CT chest 11/19/2017, PET-CT 10/21/2017 FINDINGS: Hazy bibasilar opacity. Stable cardiomediastinal silhouette with aortic atherosclerosis. No pneumothorax. CT demonstrated right apical lung nodule not well seen radiographically. IMPRESSION: Hazy bibasilar opacity, may reflect atelectasis or infiltrate. Electronically Signed   By: Donavan Foil M.D.   On: 11/26/2017 17:02    Discharge Exam: Vitals:   12/01/17 1934 12/02/17 0540  BP: 119/71 124/60  Pulse: (!) 101 99  Resp: 20 18  Temp: 98.8 F (37.1 C) 100.2 F (37.9 C)  SpO2: 91% 91%     General: Pt is alert, awake, not in acute distress Cardiovascular: RRR, S1/S2 +, no rubs, no gallops, +bilateral edema, much improved since admission  Respiratory: CTA bilaterally, no wheezing, no rhonchi, on room air  Abdominal: Soft, NT, ND, bowel sounds + Extremities: no cyanosis, bilateral LE skin changes consistent with chronic venous stasis     The results of significant diagnostics from this hospitalization (including imaging, microbiology, ancillary and laboratory) are listed below for reference.     Microbiology: Recent Results (from the past 240 hour(s))  Blood culture (routine x 2)     Status: None (Preliminary result)   Collection Time: 11/27/17  5:51 AM  Result Value Ref Range Status   Specimen Description   Final    BLOOD LEFT ANTECUBITAL Performed at [facility] [address] [place], [place] [address]    Special Requests   Final    BOTTLES DRAWN AEROBIC AND ANAEROBIC Blood Culture adequate volume Performed at [facility] [address], [place], [place] [address]    Culture   Final    NO GROWTH 4 DAYS Performed at [facility], [place] [address] [place], [place] [address]    Report Status PENDING  Incomplete  Blood culture (routine x 2)     Status: None (Preliminary result)   Collection Time: 11/27/17  9:01 AM  Result Value Ref Range Status   Specimen Description   Final    BLOOD RIGHT ARM Performed at [facility] [address] [place], [place] [address]    Special Requests   Final    BOTTLES DRAWN AEROBIC ONLY Blood Culture results may not be optimal due to an inadequate volume of blood received in culture bottles Performed at [facility] [address], [place], [place] [address]    Culture   Final    NO GROWTH 4 DAYS Performed at [facility], [place] [address] [place],  [address]  Report Status PENDING  Incomplete     Labs: BNP (last 3  results) Recent Labs    11/26/17 1640  BNP 388.8*   Basic Metabolic Panel: Recent Labs  Lab 11/28/17 0619 11/29/17 0609 11/30/17 0535 12/01/17 0533 12/02/17 0548  NA 139 135 136 136 136  K 3.7 4.6 3.3* 3.5 3.4*  CL 101 97* 97* 96* 96*  CO2 30 28 32 31 31  GLUCOSE 157* 129* 124* 136* 129*  BUN 10 9 10 12 12   CREATININE 0.74 0.68 0.82 0.83 0.84  CALCIUM 8.2* 8.0* 8.2* 7.9* 8.0*   Liver Function Tests: Recent Labs  Lab 11/26/17 1640 11/27/17 0901  AST 47* 39  ALT 33 32  ALKPHOS 139* 117  BILITOT 2.8* 3.4*  PROT 5.5* 5.2*  ALBUMIN 2.4* 2.3*   No results for input(s): LIPASE, AMYLASE in the last 168 hours. No results for input(s): AMMONIA in the last 168 hours. CBC: Recent Labs  Lab 11/26/17 1640  11/28/17 0619 11/29/17 0729 11/30/17 0535 12/01/17 0533 12/02/17 0548  WBC 2.4*   < > 1.8* 2.5* 7.5 16.4* 23.8*  NEUTROABS 1.5*  --   --   --   --   --   --   HGB 6.6*   < > 7.5* 8.0* 8.5* 8.5* 8.6*  HCT 19.3*   < > 22.1* 23.6* 25.6* 25.6* 25.6*  MCV 96.5   < > 94.8 96.3 97.7 97.7 98.5  PLT 35*   < > 24* 20* 23* 33* 50*   < > = values in this interval not displayed.   Cardiac Enzymes: Recent Labs  Lab 11/26/17 2114 11/27/17 0247 11/27/17 0901  TROPONINI 0.03* 0.03* <0.03   BNP: Invalid input(s): POCBNP CBG: Recent Labs  Lab 12/01/17 0755 12/01/17 1202 12/01/17 1651 12/01/17 2019 12/02/17 0733  GLUCAP 120* 158* 170* 226* 124*   D-Dimer No results for input(s): DDIMER in the last 72 hours. Hgb A1c No results for input(s): HGBA1C in the last 72 hours. Lipid Profile No results for input(s): CHOL, HDL, LDLCALC, TRIG, CHOLHDL, LDLDIRECT in the last 72 hours. Thyroid function studies No results for input(s): TSH, T4TOTAL, T3FREE, THYROIDAB in the last 72 hours.  Invalid input(s): FREET3 Anemia work up No results for input(s): VITAMINB12, FOLATE, FERRITIN, TIBC, IRON, RETICCTPCT in the last 72 hours. Urinalysis    Component Value Date/Time    COLORURINE AMBER (A) 11/09/2017 0447   APPEARANCEUR HAZY (A) 11/09/2017 0447   LABSPEC 1.033 (H) 11/09/2017 0447   PHURINE 5.0 11/09/2017 0447   GLUCOSEU NEGATIVE 11/09/2017 0447   HGBUR NEGATIVE 11/09/2017 0447   BILIRUBINUR SMALL (A) 11/09/2017 0447   KETONESUR NEGATIVE 11/09/2017 0447   PROTEINUR NEGATIVE 11/09/2017 0447   NITRITE NEGATIVE 11/09/2017 0447   LEUKOCYTESUR NEGATIVE 11/09/2017 0447   Sepsis Labs Invalid input(s): PROCALCITONIN,  WBC,  LACTICIDVEN Microbiology Recent Results (from the past 240 hour(s))  Blood culture (routine x 2)     Status: None (Preliminary result)   Collection Time: 11/27/17  5:51 AM  Result Value Ref Range Status   Specimen Description   Final    BLOOD LEFT ANTECUBITAL Performed at [facility], [place] [address], [place], [place] [address]    Special Requests   Final    BOTTLES DRAWN AEROBIC AND ANAEROBIC Blood Culture adequate volume Performed at [facility] [address], [place], [place] [address]    Culture   Final    NO GROWTH 4 DAYS Performed at [facility], [address], [place], [place] [address]    Report Status PENDING  Incomplete  Blood culture (routine x 2)     Status: None (Preliminary result)   Collection Time: 11/27/17  9:01 AM  Result Value Ref Range Status   Specimen Description   Final    BLOOD RIGHT ARM Performed at [facility] [address], [place], [place] [address]    Special Requests   Final    BOTTLES DRAWN AEROBIC ONLY Blood Culture results may not be optimal due to an inadequate volume of blood received in culture bottles Performed at [facility] [address], [place], [place] [address]    Culture   Final    NO GROWTH 4 DAYS Performed at [facility], [place] [address] [place], [place] [address]    Report Status PENDING  Incomplete     Patient was seen and examined on the day of discharge and was found to be  in stable condition. Time coordinating discharge: 35 minutes including assessment and coordination of care, as well as examination of the patient.   SIGNED:  Dessa Phi, DO [facility] Pager [phone]  If 7PM-7AM, please contact night-coverage [email or web address] Password [facility] 12/02/2017, 10:34 AM

## 2017-12-02 NOTE — Telephone Encounter (Signed)
Port a cath procedure cancelled today -pt in hospital and has elevated WBC. Order changed to 8/23.

## 2017-12-02 NOTE — Discharge Instructions (Signed)
Heart Failure Eating Plan Heart failure, also called congestive heart failure, occurs when your heart does not pump blood well enough to meet your body's needs for oxygen-rich blood. Heart failure is a long-term (chronic) condition. Living with heart failure can be challenging. However, following your health care provider's instructions about a healthy lifestyle and working with a diet and nutrition specialist (dietitian) to choose the right foods may help to improve your symptoms. What are tips for following this plan? General guidelines  Do not eat more than 2,300 mg of salt (sodium) a day. The amount of sodium that is recommended for you may be lower, depending on your condition.  Maintain a healthy body weight as directed. Ask your health care provider what a healthy weight is for you. ? Check your weight every day. ? Work with your health care provider and dietitian to make a plan that is right for you to lose weight or maintain your current weight.  Limit how much fluid you drink. Ask your health care provider or dietitian how much fluid you can have each day.  Limit or avoid alcohol as told by your health care provider or dietitian. Reading food labels  Check food labels for the amount of sodium per serving. Choose foods that have less than 140 mg (milligrams) of sodium in each serving.  Check food labels for the number of calories per serving. This is important if you need to limit your daily calorie intake to lose weight.  Check food labels for the serving size. If you eat more than one serving, you will be eating more sodium and calories than what is listed on the label.  Look for foods that are labeled as "sodium-free," "very low sodium," or "low sodium." ? Foods labeled as "reduced sodium" or "lightly salted" may still have more sodium than what is recommended for you. Cooking  Avoid adding salt when cooking. Ask your health care provider or dietitian before using salt  substitutes.  Season food with salt-free seasonings, spices, or herbs. Check the label of seasoning mixes to make sure they do not contain salt.  Cook with heart-healthy oils, such as olive, canola, soybean, or sunflower oil.  Do not fry foods. Cook foods using low-fat methods, such as baking, boiling, grilling, and broiling.  Limit unhealthy fats when cooking by: ? Removing the skin from poultry, such as chicken. ? Removing all visible fats from meats. ? Skimming the fat off from stews, soups, and gravies before serving them. Meal planning  Limit your intake of: ? Processed, canned, or pre-packaged foods. ? Foods that are high in trans fat, such as fried foods. ? Sweets, desserts, sugary drinks, and other foods with added sugar. ? Full-fat dairy products, such as whole milk.  Eat a balanced diet that includes: ? 4-5 servings of fruit each day and 4-5 servings of vegetables each day. At each meal, try to fill half of your plate with fruits and vegetables. ? Up to 6-8 servings of whole grains each day. ? Up to 2 servings of lean meat, poultry, or fish each day. One serving of meat is equal to 3 oz. This is about the same size as a deck of cards. ? 2 servings of low-fat dairy each day. ? Heart-healthy fats. Healthy fats called omega-3 fatty acids are found in foods such as flaxseed and cold-water fish like sardines, salmon, and mackerel.  Aim to eat 25-35 g (grams) of fiber a day. Foods that are high in fiber include apples,  broccoli, carrots, beans, peas, and whole grains.  Do not add salt or condiments that contain salt (such as soy sauce) to foods before eating.  When eating at a restaurant, ask that your food be prepared with less salt or no salt, if possible.  Try to eat 2 or more vegetarian meals each week.  Eat more home-cooked food and eat less restaurant, buffet, and fast food. Recommended foods The items listed may not be a complete list. Talk with your dietitian about  what dietary choices are best for you. Grains Bread with less than 80 mg of sodium per slice. Whole-wheat pasta, quinoa, and brown rice. Oats and oatmeal. Barley. [place]. Grits and cream of wheat. Whole-grain and whole-wheat cold cereal. Vegetables All fresh vegetables. Vegetables that are frozen without sauce or added salt. Low-sodium or sodium-free canned vegetables. Fruits All fresh, frozen, and canned fruits. Dried fruits, such as raisins, prunes, and cranberries. Meats and other protein foods Lean cuts of meat. Skinless chicken and [place]. Fish with high omega-3 fatty acids, such as salmon, sardines, and other cold-water fishes. Eggs. Dried beans, peas, and edamame. Unsalted nuts and nut butters. Dairy Low-fat or nonfat (skim) milk and dried milk. Rice milk, soy milk, and almond milk. Low-fat or nonfat yogurt. Small amounts of reduced-sodium block cheese. Low-sodium cottage cheese. Fats and oils Olive, canola, soybean, flaxseed, or sunflower oil. Avocado. Sweets and desserts Apple sauce. Granola bars. Sugar-free pudding and gelatin. Frozen fruit bars. Seasoning and other foods Fresh and dried herbs. Lemon or lime juice. Vinegar. Low-sodium ketchup. Salt-free marinades, salad dressings, sauces, and seasonings. Foods to avoid The items listed may not be a complete list. Talk with your dietitian about what dietary choices are best for you. Grains Bread with more than 80 mg of sodium per slice. Hot or cold cereal with more than 140 mg sodium per serving. Salted pretzels and crackers. Pre-packaged breadcrumbs. Bagels, croissants, and biscuits. Vegetables Canned vegetables. Frozen vegetables with sauce or seasonings. Creamed vegetables. [place] fries. Onion rings. Pickled vegetables and sauerkraut. Fruits Fruits that are dried with sodium-containing preservatives. Meats and other protein foods Ribs and chicken wings. Bacon, ham, pepperoni, bologna, salami, and packaged luncheon meats. Hot  dogs, bratwurst, and sausage. Canned meat. Smoked meat and fish. Salted nuts and seeds. Dairy Whole milk, half-and-half, and cream. Buttermilk. Processed cheese, cheese spreads, and cheese curds. Regular cottage cheese. Feta cheese. Shredded cheese. String cheese. Fats and oils Butter, lard, shortening, ghee, and bacon fat. Canned and packaged gravies. Seasoning and other foods Onion salt, garlic salt, table salt, and sea salt. Marinades. Regular salad dressings. Relishes, pickles, and olives. Meat flavorings and tenderizers, and bouillon cubes. Horseradish, ketchup, and mustard. Worcestershire sauce. Teriyaki sauce, soy sauce (including reduced sodium). Hot sauce and Tabasco sauce. Steak sauce, fish sauce, oyster sauce, and cocktail sauce. Taco seasonings. Barbecue sauce. Tartar sauce. Summary  A heart failure eating plan includes changes that limit your intake of sodium and unhealthy fat, and it may help you lose weight or maintain a healthy weight. Your health care provider may also recommend limiting how much fluid you drink.  Most people with heart failure should eat no more than 2,300 mg of salt (sodium) a day. The amount of sodium that is recommended for you may be lower, depending on your condition.  Contact your health care provider or dietitian before making any major changes to your diet. This information is not intended to replace advice given to you by your health care provider. Make sure you  discuss any questions you have with your health care provider. Document Released: 08/17/2016 Document Revised: 08/17/2016 Document Reviewed: 08/17/2016 Elsevier Interactive Patient Education  2018 [employer].

## 2017-12-03 ENCOUNTER — Emergency Department (HOSPITAL_COMMUNITY)
Admission: EM | Admit: 2017-12-03 | Discharge: 2017-12-03 | Disposition: A | Discharge status: Home / Self Care | Payer: BLUE CROSS/BLUE SHIELD | Attending: Emergency Medicine | Admitting: Emergency Medicine

## 2017-12-03 ENCOUNTER — Encounter (HOSPITAL_COMMUNITY): Payer: Self-pay | Admitting: Emergency Medicine

## 2017-12-03 ENCOUNTER — Emergency Department (HOSPITAL_COMMUNITY): Payer: BLUE CROSS/BLUE SHIELD

## 2017-12-03 ENCOUNTER — Other Ambulatory Visit: Payer: Self-pay

## 2017-12-03 DIAGNOSIS — M7989 Other specified soft tissue disorders: Secondary | ICD-10-CM

## 2017-12-03 DIAGNOSIS — R509 Fever, unspecified: Secondary | ICD-10-CM

## 2017-12-03 LAB — URINALYSIS, ROUTINE W REFLEX MICROSCOPIC
BILIRUBIN URINE: NEGATIVE
Glucose, UA: NEGATIVE mg/dL
Hgb urine dipstick: NEGATIVE
KETONES UR: NEGATIVE mg/dL
Leukocytes, UA: NEGATIVE
NITRITE: NEGATIVE
Protein, ur: NEGATIVE mg/dL
SPECIFIC GRAVITY, URINE: 1.01 (ref 1.005–1.030)
pH: 7 (ref 5.0–8.0)

## 2017-12-03 LAB — COMPREHENSIVE METABOLIC PANEL
ALK PHOS: 180 U/L — AB (ref 38–126)
ALT: 32 U/L (ref 0–44)
ANION GAP: 12 (ref 5–15)
AST: 45 U/L — ABNORMAL HIGH (ref 15–41)
Albumin: 2.6 g/dL — ABNORMAL LOW (ref 3.5–5.0)
BILIRUBIN TOTAL: 2.4 mg/dL — AB (ref 0.3–1.2)
BUN: 15 mg/dL (ref 8–23)
CALCIUM: 8.3 mg/dL — AB (ref 8.9–10.3)
CO2: 32 mmol/L (ref 22–32)
Chloride: 96 mmol/L — ABNORMAL LOW (ref 98–111)
Creatinine, Ser: 1.05 mg/dL (ref 0.61–1.24)
GFR calc Af Amer: >60 mL/min (ref >60)
GLUCOSE: 145 mg/dL — AB (ref 70–99)
POTASSIUM: 4 mmol/L (ref 3.5–5.1)
Sodium: 140 mmol/L (ref 135–145)
TOTAL PROTEIN: 5.9 g/dL — AB (ref 6.5–8.1)

## 2017-12-03 LAB — CBC WITH DIFFERENTIAL/PLATELET
BASOS ABS: 0 10*3/uL (ref 0.0–0.1)
Basophils Relative: 0 %
EOS ABS: 0 10*3/uL (ref 0.0–0.7)
Eosinophils Relative: 0 %
HCT: 27.3 % — ABNORMAL LOW (ref 39.0–52.0)
Hemoglobin: 9.1 g/dL — ABNORMAL LOW (ref 13.0–17.0)
LYMPHS ABS: 2.9 10*3/uL (ref 0.7–4.0)
LYMPHS PCT: 9 %
MCH: 32.9 pg (ref 26.0–34.0)
MCHC: 33.3 g/dL (ref 30.0–36.0)
MCV: 98.6 fL (ref 78.0–100.0)
Monocytes Absolute: 3.2 10*3/uL — ABNORMAL HIGH (ref 0.1–1.0)
Monocytes Relative: 10 %
NEUTROS ABS: 25.8 10*3/uL — AB (ref 1.7–7.7)
Neutrophils Relative %: 81 %
PLATELETS: 71 10*3/uL — AB (ref 150–400)
RBC: 2.77 MIL/uL — AB (ref 4.22–5.81)
RDW: 18.8 % — ABNORMAL HIGH (ref 11.5–15.5)
WBC: 31.9 10*3/uL — AB (ref 4.0–10.5)

## 2017-12-03 LAB — I-STAT CG4 LACTIC ACID, ED: Lactic Acid, Venous: 1.98 mmol/L — ABNORMAL HIGH (ref 0.5–1.9)

## 2017-12-03 MED ORDER — FUROSEMIDE 10 MG/ML IJ SOLN
40.0000 mg | Freq: Once | INTRAMUSCULAR | Status: AC
  Administered 2017-12-03: 40 mg via INTRAVENOUS
  Filled 2017-12-03: qty 4

## 2017-12-03 NOTE — Discharge Instructions (Addendum)
You were seen today for low-grade fever and continued leg swelling.  Continue your Lasix at home.  Monitor for worsening fevers.  Anything greater than 101, call your oncologist.  They will follow your blood cultures.  Otherwise her work-up is largely reassuring.

## 2017-12-03 NOTE — ED Provider Notes (Signed)
[facility] DEPT Provider Note   CSN: [id number] Arrival date & time: 12/02/17  2350     History   Chief Complaint Chief Complaint  Patient presents with  . Fever  . Cancer  . Urinary Retention    HPI Robert Compton is a 72 y.o. male.  HPI  This is a 72 year old male with a history of metastatic lung cancer, diabetes, hypertension who presents with shortness of breath and urinary retention.  Patient reports that he was discharged from the hospital yesterday morning.  He states "they let me go too soon."  He reports that he had a fever prior to discharge.  He reports persistent fevers to 100.2 at home.  He states that he has not had any urinary output today.  He was admitted to the hospital for diastolic heart failure and was diuresed aggressively.  I have reviewed the chart and it appears he lost 42 pounds during hospitalization.  Patient reports that he left with a discharge weight of 267 but that his weight at home was 293.  He feels like he is retaining more fluid throughout the day.  He also reports shortness of breath the last hour.  No chest pain.  He reports chronic right-sided abdominal pain and back pain which is unchanged from prior.  I reviewed the patient's chart.  He was admitted on August 13 and was discharged on August 19.  He had a 42 pound weight loss and a 9.8 L diuresis.  This was thought to be secondary to diastolic heart failure.  He is placed on Lasix twice daily.  Past Medical History:  Diagnosis Date  . Arthritis   . Cancer ([place])   . Diabetes mellitus without complication ([place])   . Hypertension     Patient Active Problem List   Diagnosis Date Noted  . Acute on chronic diastolic (congestive) heart failure ([place]) 11/27/2017  . Tachycardia 11/26/2017  . Symptomatic anemia 11/26/2017  . Leg edema, right 11/26/2017  . Dehydration   . Pressure injury of skin 11/09/2017  . Hyperbilirubinemia   . Liver metastasis ([place])   .  Neutropenic fever ([place])   . Pancytopenia ([place])   . Febrile neutropenia ([place]) 11/08/2017  . Severe sepsis ([place]) 11/08/2017  . Antineoplastic chemotherapy induced pancytopenia ([place]) 11/08/2017  . AKI (acute kidney injury) ([place]) 11/08/2017  . Encounter for antineoplastic chemotherapy   . Extensive stage primary small cell carcinoma of lung ([place]) 10/03/2017  . Liver masses   . Mass of upper lobe of right lung 09/28/2017  . Hyponatremia 09/27/2017  . Failure to thrive in adult 09/27/2017  . Metastatic cancer to liver ([place]) 09/27/2017  . DM (diabetes mellitus), type 2 ([place]) 09/27/2017  . Essential hypertension 09/27/2017    Past Surgical History:  Procedure Laterality Date  . HERNIA REPAIR    . JOINT REPLACEMENT Right    knee  . LIVER BIOPSY          Home Medications    Prior to Admission medications   Medication Sig Start Date End Date Taking? Authorizing Provider  CINNAMON PO Take 600 mg by mouth 2 (two) times daily.   Yes [provider]  fish oil-omega-3 fatty acids 1000 MG capsule Take 1 g by mouth 2 (two) times daily.   Yes [provider]  fluticasone (FLONASE) 50 MCG/ACT nasal spray Place 2 sprays into both nostrils daily as needed for allergies. 11/12/17  Yes Eugenie Filler, MD  furosemide (LASIX) 20 MG tablet  Take 2 tablets (40 mg total) by mouth 2 (two) times daily. 12/02/17  Yes Dessa Phi, DO  insulin aspart (NOVOLOG) 100 UNIT/ML injection CBG < 70: implement hypoglycemia protocol CBG 70 - 120: 0 units CBG 121 - 150: 2 units CBG 151 - 200: 3 units CBG 201 - 250: 5 units CBG 251 - 300: 8 units CBG 301 - 350: 11 units CBG 351 - 400: 15 units CBG > 400: call MD 10/08/17  Yes Lavina Hamman, MD  loratadine (CLARITIN) 10 MG tablet Take 1 tablet (10 mg total) by mouth daily. 11/13/17  Yes Eugenie Filler, MD  metFORMIN (GLUCOPHAGE) 1000 MG tablet Take 1,000 mg by mouth 2 (two) times daily with a meal.   Yes [provider]  nicotine (NICODERM  CQ - DOSED IN MG/24 HOURS) 21 mg/24hr patch Place 1 patch (21 mg total) onto the skin daily. 11/12/17  Yes Eugenie Filler, MD  omeprazole (PRILOSEC) 20 MG capsule Take 20 mg by mouth daily.   Yes [provider]  Potassium Chloride ER 20 MEQ TBCR Take 20 mEq by mouth 2 (two) times daily. 12/02/17  Yes Dessa Phi, DO  Red Yeast Rice 600 MG TABS Take 1 tablet by mouth 2 (two) times daily.    Yes [provider]  traMADol (ULTRAM) 50 MG tablet Take 1 tablet (50 mg total) by mouth every 6 (six) hours as needed for moderate pain. Patient taking differently: Take 50 mg by mouth every 4 (four) hours as needed for moderate pain.  10/08/17  Yes Lavina Hamman, MD  polyethylene glycol [facility] / GLYCOLAX) packet Take 17 g by mouth daily. Patient not taking: Reported on 11/18/2017 10/09/17   Lavina Hamman, MD    Family History Family History  Problem Relation Age of Onset  . Diabetes Mother   . Dementia Mother   . Colon cancer Father   . CAD Neg Hx     Social History Social History   Tobacco Use  . Smoking status: Former Smoker    Types: Cigarettes    Last attempt to quit: 09/20/2017    Years since quitting: 0.2  . Smokeless tobacco: Never Used  Substance Use Topics  . Alcohol use: No  . Drug use: No     Allergies   Amoxicillin-pot clavulanate and Nitrofurantoin   Review of Systems Review of Systems  Constitutional: Positive for chills and fever.  Respiratory: Positive for shortness of breath.   Cardiovascular: Positive for leg swelling. Negative for chest pain.  Gastrointestinal: Positive for abdominal pain. Negative for nausea and vomiting.  Genitourinary: Positive for difficulty urinating.  Musculoskeletal: Positive for back pain.  All other systems reviewed and are negative.    Physical Exam Updated Vital Signs BP 134/61   Pulse (!) 109   Temp 99 F (37.2 C) (Oral)   Resp 20   Ht 6\' 3"  (1.905 m)   Wt 130.1 kg   SpO2 92%   BMI 35.86 kg/m    Physical Exam  Constitutional: He is oriented to person, place, and time.  Chronically ill-appearing, obese, jaundiced  HENT:  Head: Normocephalic and atraumatic.  Eyes: Pupils are equal, round, and reactive to light.  Icterus noted  Neck: Neck supple.  Cardiovascular: Normal rate, regular rhythm and normal heart sounds.  No murmur heard. Pulmonary/Chest: Effort normal and breath sounds normal. No respiratory distress. He has no wheezes.  Abdominal: Soft. Bowel sounds are normal. He exhibits distension. There is tenderness. There is  no rebound.  Left mid abdominal tenderness to palpation, no rebound or guarding  Musculoskeletal: He exhibits edema.  Neurological: He is alert and oriented to person, place, and time.  Skin: Skin is warm and dry.  Chronic venous stasis changes bilateral lower extremities, right wound on the dorsum of the foot  Psychiatric: He has a normal mood and affect.  Nursing note and vitals reviewed.    ED Treatments / Results  Labs (all labs ordered are listed, but only abnormal results are displayed) Labs Reviewed  COMPREHENSIVE METABOLIC PANEL - Abnormal; Notable for the following components:      Result Value   Chloride 96 (*)    Glucose, Bld 145 (*)    Calcium 8.3 (*)    Total Protein 5.9 (*)    Albumin 2.6 (*)    AST 45 (*)    Alkaline Phosphatase 180 (*)    Total Bilirubin 2.4 (*)    All other components within normal limits  CBC WITH DIFFERENTIAL/PLATELET - Abnormal; Notable for the following components:   WBC 31.9 (*)    RBC 2.77 (*)    Hemoglobin 9.1 (*)    HCT 27.3 (*)    RDW 18.8 (*)    Platelets 71 (*)    Neutro Abs 25.8 (*)    Monocytes Absolute 3.2 (*)    All other components within normal limits  I-STAT CG4 LACTIC ACID, ED - Abnormal; Notable for the following components:   Lactic Acid, Venous 1.98 (*)    All other components within normal limits  CULTURE, BLOOD (ROUTINE X 2)  CULTURE, BLOOD (ROUTINE X 2)  URINALYSIS, ROUTINE  W REFLEX MICROSCOPIC  I-STAT CG4 LACTIC ACID, ED  I-STAT CG4 LACTIC ACID, ED    EKG None  Radiology Dg Chest 2 View  Result Date: 12/03/2017 CLINICAL DATA:  Possible infection.  Fever. EXAM: CHEST - 2 VIEW COMPARISON:  Radiograph and CT 11/26/2017 FINDINGS: Unchanged heart size and mediastinal contours. Small pleural effusions which are similar to prior exam allowing for differences in technique. No pulmonary edema. Right apical nodule on CT not well seen radiographically. No new airspace disease. No pneumothorax. IMPRESSION: Unchanged pleural effusions over the past week. No new abnormality. Right apical nodule not well seen radiographically. Electronically Signed   By: Jeb Levering M.D.   On: 12/03/2017 02:19    Procedures Procedures (including critical care time)  Medications Ordered in ED Medications  furosemide (LASIX) injection 40 mg (40 mg Intravenous Given 12/03/17 0416)     Initial Impression / Assessment and Plan / ED Course  I have reviewed the triage vital signs and the nursing notes.  Pertinent labs & imaging results that were available during my care of the patient were reviewed by me and considered in my medical decision making (see chart for details).  Clinical Course as of Dec 03 549  Tue Dec 03, 2017  0547 Spoke with Dr. Jana Hakim, on-call for oncology guarding upward trending leukocytosis.  Agrees that this is likely related to injection of you Unyca (Neulasta).  This would not likely cause fevers.  However, T-max here 100.  Blood cultures are pending and no obvious source of infection at this time.  I have discussed the work-up with the patient and his family.  Continue diuresis at home.  Patient has had good urinary output after receiving 40 mg of IV Lasix here.  His weight appears downtrending.  Recorded weight at discharge 140.1 kg.  Weight today 130.1 kg.  Follow-up with  primary physician and oncologist.   [CH]    Clinical Course User Index [CH] Heinz Eckert,  Barbette Hair, MD    Patient presents with complaints of continued fluid retention, shortness of breath, low-grade fevers.  He is chronically ill-appearing but nontoxic.  T-max 100.  Heart rate 100-105.  He does appear somewhat volume overloaded.  Work-up initiated.  I have reviewed his chart.  Weight reported at discharge 140.1 kg.  Weight today 130.1 kg.  Does not appear to have gained significant weight but is actually losing weight.  He has had good urinary output after IV Lasix.  Lab work reviewed.  Does have a significant leukocytosis but this is likely related to medication given on 8/9.  Lactate is normal.  No indication of sepsis.  Chest x-ray shows no evidence of acute pneumonia.  Unchanged pleural effusions.  Additionally, urinalysis without evidence of infection.  No infectious source noted.  See clinical course above.  Feel patient is stable for discharge home.  He was able to ambulate and maintain his pulse ox 96 to 98%.  After history, exam, and medical workup I feel the patient has been appropriately medically screened and is safe for discharge home. Pertinent diagnoses were discussed with the patient. Patient was given return precautions.   Final Clinical Impressions(s) / ED Diagnoses   Final diagnoses:  Leg swelling  Low grade fever    ED Discharge Orders    None       Merryl Hacker, MD 12/03/17 [phone]

## 2017-12-03 NOTE — ED Triage Notes (Signed)
Patient was just discharged home 01/02/18 in the morning. Patient states that since then he is retaining fluid, he feels weak, and he has a fever. Patient states he has colon cancer.

## 2017-12-03 NOTE — ED Notes (Signed)
Pt was upset d/t several stick attempts.  IV team was able to start second IV access.  After IV team left the room, pt states "I asked her to send me the ultrasound man, they sent me the one from [place] instead."

## 2017-12-03 NOTE — ED Notes (Signed)
Patient bladder scanner is 49 the first time and 29 the second time.

## 2017-12-03 NOTE — ED Notes (Signed)
Patient refused to be stuck again.

## 2017-12-08 LAB — CULTURE, BLOOD (ROUTINE X 2)
CULTURE: NO GROWTH
Culture: NO GROWTH
Special Requests: ADEQUATE

## 2017-12-09 ENCOUNTER — Other Ambulatory Visit: Payer: Self-pay | Admitting: Medical Oncology

## 2017-12-09 ENCOUNTER — Inpatient Hospital Stay (HOSPITAL_BASED_OUTPATIENT_CLINIC_OR_DEPARTMENT_OTHER): Payer: BLUE CROSS/BLUE SHIELD | Admitting: Internal Medicine

## 2017-12-09 ENCOUNTER — Inpatient Hospital Stay: Payer: BLUE CROSS/BLUE SHIELD

## 2017-12-09 ENCOUNTER — Encounter: Payer: Self-pay | Admitting: Internal Medicine

## 2017-12-09 ENCOUNTER — Telehealth: Payer: Self-pay | Admitting: Internal Medicine

## 2017-12-09 VITALS — BP 118/62 | HR 93 | Temp 97.7°F | Resp 14 | Ht 75.0 in | Wt 283.3 lb

## 2017-12-09 DIAGNOSIS — C787 Secondary malignant neoplasm of liver and intrahepatic bile duct: Secondary | ICD-10-CM | POA: Diagnosis not present

## 2017-12-09 DIAGNOSIS — D61818 Other pancytopenia: Secondary | ICD-10-CM | POA: Diagnosis not present

## 2017-12-09 DIAGNOSIS — C3490 Malignant neoplasm of unspecified part of unspecified bronchus or lung: Secondary | ICD-10-CM

## 2017-12-09 DIAGNOSIS — Z5189 Encounter for other specified aftercare: Secondary | ICD-10-CM | POA: Diagnosis not present

## 2017-12-09 DIAGNOSIS — Z5111 Encounter for antineoplastic chemotherapy: Secondary | ICD-10-CM

## 2017-12-09 DIAGNOSIS — D649 Anemia, unspecified: Secondary | ICD-10-CM

## 2017-12-09 DIAGNOSIS — C3411 Malignant neoplasm of upper lobe, right bronchus or lung: Secondary | ICD-10-CM

## 2017-12-09 DIAGNOSIS — I10 Essential (primary) hypertension: Secondary | ICD-10-CM

## 2017-12-09 LAB — CMP (CANCER CENTER ONLY)
ALBUMIN: 2.5 g/dL — AB (ref 3.5–5.0)
ALT: 30 U/L (ref 0–44)
ANION GAP: 11 (ref 5–15)
AST: 44 U/L — ABNORMAL HIGH (ref 15–41)
Alkaline Phosphatase: 172 U/L — ABNORMAL HIGH (ref 38–126)
BUN: 13 mg/dL (ref 8–23)
CO2: 29 mmol/L (ref 22–32)
Calcium: 8.1 mg/dL — ABNORMAL LOW (ref 8.9–10.3)
Chloride: 98 mmol/L (ref 98–111)
Creatinine: 1.07 mg/dL (ref 0.61–1.24)
GFR, Est AFR Am: >60 mL/min (ref >60)
GFR, Estimated: >60 mL/min (ref >60)
GLUCOSE: 155 mg/dL — AB (ref 70–99)
POTASSIUM: 3.5 mmol/L (ref 3.5–5.1)
SODIUM: 138 mmol/L (ref 135–145)
Total Bilirubin: 2.4 mg/dL — ABNORMAL HIGH (ref 0.3–1.2)
Total Protein: 6.2 g/dL — ABNORMAL LOW (ref 6.5–8.1)

## 2017-12-09 LAB — CBC WITH DIFFERENTIAL (CANCER CENTER ONLY)
BASOS ABS: 0.1 10*3/uL (ref 0.0–0.1)
Basophils Relative: 1 %
Eosinophils Absolute: 0.1 10*3/uL (ref 0.0–0.5)
Eosinophils Relative: 1 %
HEMATOCRIT: 27.1 % — AB (ref 38.4–49.9)
HEMOGLOBIN: 9.2 g/dL — AB (ref 13.0–17.1)
LYMPHS PCT: 11 %
Lymphs Abs: 1 10*3/uL (ref 0.9–3.3)
MCH: 33.4 pg (ref 27.2–33.4)
MCHC: 33.9 g/dL (ref 32.0–36.0)
MCV: 98.7 fL — AB (ref 79.3–98.0)
MONO ABS: 1.1 10*3/uL — AB (ref 0.1–0.9)
MONOS PCT: 11 %
NEUTROS ABS: 7.5 10*3/uL — AB (ref 1.5–6.5)
Neutrophils Relative %: 76 %
Platelet Count: 129 10*3/uL — ABNORMAL LOW (ref 140–400)
RBC: 2.74 MIL/uL — ABNORMAL LOW (ref 4.20–5.82)
RDW: 20.2 % — AB (ref 11.0–14.6)
WBC Count: 9.7 10*3/uL (ref 4.0–10.3)

## 2017-12-09 MED ORDER — SODIUM CHLORIDE 0.9 % IV SOLN
Freq: Once | INTRAVENOUS | Status: AC
  Administered 2017-12-09: 12:00:00 via INTRAVENOUS
  Filled 2017-12-09: qty 5

## 2017-12-09 MED ORDER — SODIUM CHLORIDE 0.9 % IV SOLN
750.0000 mg | Freq: Once | INTRAVENOUS | Status: DC
  Filled 2017-12-09: qty 75

## 2017-12-09 MED ORDER — SODIUM CHLORIDE 0.9 % IV SOLN
Freq: Once | INTRAVENOUS | Status: AC
  Administered 2017-12-09: 12:00:00 via INTRAVENOUS
  Filled 2017-12-09: qty 250

## 2017-12-09 MED ORDER — PALONOSETRON HCL INJECTION 0.25 MG/5ML
0.2500 mg | Freq: Once | INTRAVENOUS | Status: AC
  Administered 2017-12-09: 0.25 mg via INTRAVENOUS

## 2017-12-09 MED ORDER — PALONOSETRON HCL INJECTION 0.25 MG/5ML
INTRAVENOUS | Status: AC
  Filled 2017-12-09: qty 5

## 2017-12-09 MED ORDER — SODIUM CHLORIDE 0.9 % IV SOLN
90.0000 mg/m2 | Freq: Once | INTRAVENOUS | Status: AC
  Administered 2017-12-09: 250 mg via INTRAVENOUS
  Filled 2017-12-09: qty 12.5

## 2017-12-09 MED ORDER — SODIUM CHLORIDE 0.9 % IV SOLN
600.0000 mg | Freq: Once | INTRAVENOUS | Status: AC
  Administered 2017-12-09: 600 mg via INTRAVENOUS
  Filled 2017-12-09: qty 60

## 2017-12-09 NOTE — Telephone Encounter (Signed)
appts already scheduled per 8/26 los.

## 2017-12-09 NOTE — Progress Notes (Signed)
@  1:15pm  Per MD, Decrease Carboplatin to AUC 4, Dose = 600mg .  B. Corey Skains, PharmD, BCPS, BCOP

## 2017-12-09 NOTE — Progress Notes (Signed)
[place] [phone]   [phone]  OFFICE PROGRESS NOTE  [name], MD [place] [address]  DIAGNOSIS: Extensive stage small cell lung cancer presented with right upper lobe lung mass in addition to few other pulmonary nodules and significant mediastinal lymphadenopathy and liver metastases diagnosed in June 2019.   PRIOR THERAPY: None  CURRENT THERAPY: Systemic chemotherapy with carboplatin for AUC of 4 on day 1 and etoposide 100 mg/M2 on days 1, 2 and 3 with Neulasta support every 3 weeks.  Status post 3 cycles.  INTERVAL HISTORY: Robert Compton 72 y.o. male returns to the clinic today for follow-up visit accompanied by 3 of his children.  The patient is feeling fine today with no specific complaints.  He continues to tolerate his treatment fairly well except for fatigue and aching pain after the Neulasta injection.  His treatment was also complicated with pancytopenia and he requires PRBCs transfusion.  He denied having any chest pain but continues to have shortness of breath with exertion with no cough or hemoptysis.  He has no nausea, vomiting, diarrhea or constipation.  He has no fever or chills.  He had repeat CT scan of the chest performed recently and he is here for evaluation and discussion of his discuss results.  MEDICAL HISTORY: Past Medical History:  Diagnosis Date  . Arthritis   . Cancer ([facility])   . Diabetes mellitus without complication ([facility])   . Hypertension     ALLERGIES:  is allergic to amoxicillin-pot clavulanate and nitrofurantoin.  MEDICATIONS:  Current Outpatient Medications  Medication Sig Dispense Refill  . CINNAMON PO Take 600 mg by mouth 2 (two) times daily.    . fish oil-omega-3 fatty acids 1000 MG capsule Take 1 g by mouth 2 (two) times daily.    . fluticasone (FLONASE) 50 MCG/ACT nasal spray Place 2 sprays into both nostrils daily as needed for allergies. 16 g 0  . furosemide (LASIX)  20 MG tablet Take 2 tablets (40 mg total) by mouth 2 (two) times daily. 60 tablet 0  . insulin aspart (NOVOLOG) 100 UNIT/ML injection CBG < 70: implement hypoglycemia protocol CBG 70 - 120: 0 units CBG 121 - 150: 2 units CBG 151 - 200: 3 units CBG 201 - 250: 5 units CBG 251 - 300: 8 units CBG 301 - 350: 11 units CBG 351 - 400: 15 units CBG > 400: call MD 10 mL 11  . loratadine (CLARITIN) 10 MG tablet Take 1 tablet (10 mg total) by mouth daily. 30 tablet 0  . metFORMIN (GLUCOPHAGE) 1000 MG tablet Take 1,000 mg by mouth 2 (two) times daily with a meal.    . nicotine (NICODERM CQ - DOSED IN MG/24 HOURS) 21 mg/24hr patch Place 1 patch (21 mg total) onto the skin daily. 28 patch 0  . omeprazole (PRILOSEC) 20 MG capsule Take 20 mg by mouth daily.    . Potassium Chloride ER 20 MEQ TBCR Take 20 mEq by mouth 2 (two) times daily. 60 tablet 0  . Red Yeast Rice 600 MG TABS Take 1 tablet by mouth 2 (two) times daily.     . polyethylene glycol (MIRALAX / GLYCOLAX) packet Take 17 g by mouth daily. (Patient not taking: Reported on 11/18/2017) 14 each 0  . traMADol (ULTRAM) 50 MG tablet Take 1 tablet (50 mg total) by mouth every 6 (six) hours as needed for moderate pain. (Patient not taking: Reported on 12/09/2017) 20 tablet 0  No current facility-administered medications for this visit.     SURGICAL HISTORY:  Past Surgical History:  Procedure Laterality Date  . HERNIA REPAIR    . JOINT REPLACEMENT Right    knee  . LIVER BIOPSY      REVIEW OF SYSTEMS:  Constitutional: positive for fatigue Eyes: negative Ears, nose, mouth, throat, and face: negative Respiratory: positive for dyspnea on exertion Cardiovascular: negative Gastrointestinal: negative Genitourinary:negative Integument/breast: negative Hematologic/lymphatic: negative Musculoskeletal:positive for muscle weakness Neurological: negative Behavioral/Psych: negative Endocrine: negative Allergic/Immunologic: negative   PHYSICAL EXAMINATION:  General appearance: alert, cooperative, fatigued and no distress Head: Normocephalic, without obvious abnormality, atraumatic Neck: no adenopathy, no JVD, supple, symmetrical, trachea midline and thyroid not enlarged, symmetric, no tenderness/mass/nodules Lymph nodes: Cervical, supraclavicular, and axillary nodes normal. Resp: clear to auscultation bilaterally Back: symmetric, no curvature. ROM normal. No CVA tenderness. Cardio: regular rate and rhythm, S1, S2 normal, no murmur, click, rub or gallop GI: soft, non-tender; bowel sounds normal; no masses,  no organomegaly Extremities: edema 1+ Neurologic: Alert and oriented X 3, normal strength and tone. Normal symmetric reflexes. Normal coordination and gait  ECOG PERFORMANCE STATUS: 1 - Symptomatic but completely ambulatory  Blood pressure 118/62, pulse 93, temperature 97.7 F (36.5 C), temperature source Oral, resp. rate 14, height 6\' 3"  (1.905 m), weight 283 lb 4.8 oz (128.5 kg), SpO2 99 %.  LABORATORY DATA: Lab Results  Component Value Date   WBC 9.7 12/09/2017   HGB 9.2 (L) 12/09/2017   HCT 27.1 (L) 12/09/2017   MCV 98.7 (H) 12/09/2017   PLT 129 (L) 12/09/2017      Chemistry      Component Value Date/Time   NA 140 12/03/2017 0243   K 4.0 12/03/2017 0243   CL 96 (L) 12/03/2017 0243   CO2 32 12/03/2017 0243   BUN 15 12/03/2017 0243   CREATININE 1.05 12/03/2017 0243   CREATININE 0.82 11/18/2017 1043      Component Value Date/Time   CALCIUM 8.3 (L) 12/03/2017 0243   ALKPHOS 180 (H) 12/03/2017 0243   AST 45 (H) 12/03/2017 0243   AST 34 11/18/2017 1043   ALT 32 12/03/2017 0243   ALT 28 11/18/2017 1043   BILITOT 2.4 (H) 12/03/2017 0243   BILITOT 3.6 (HH) 11/18/2017 1043       RADIOGRAPHIC STUDIES: Dg Chest 2 View  Result Date: 12/03/2017 CLINICAL DATA:  Possible infection.  Fever. EXAM: CHEST - 2 VIEW COMPARISON:  Radiograph and CT 11/26/2017 FINDINGS: Unchanged heart size and mediastinal contours. Small pleural  effusions which are similar to prior exam allowing for differences in technique. No pulmonary edema. Right apical nodule on CT not well seen radiographically. No new airspace disease. No pneumothorax. IMPRESSION: Unchanged pleural effusions over the past week. No new abnormality. Right apical nodule not well seen radiographically. Electronically Signed   By: Jeb Levering M.D.   On: 12/03/2017 02:19   Dg Chest 2 View  Result Date: 11/24/2017 CLINICAL DATA:  Leg swelling and blisters EXAM: CHEST - 2 VIEW COMPARISON:  November 08, 2017 FINDINGS: The heart size and mediastinal contours are within normal limits. There is no focal infiltrate or pulmonary edema. There are small bilateral posterior pleural effusions. The visualized skeletal structures are stable. IMPRESSION: Small bilateral posterior pleural effusions.  No focal pneumonia. Electronically Signed   By: Abelardo Diesel M.D.   On: 11/24/2017 17:30   Ct Chest W Contrast  Result Date: 11/19/2017 CLINICAL DATA:  Small cell lung cancer with right upper lobe mass,  pulmonary nodules, mediastinal adenopathy and liver metastases diagnosed June 2019. EXAM: CT CHEST WITH CONTRAST TECHNIQUE: Multidetector CT imaging of the chest was performed during intravenous contrast administration. CONTRAST:  50mL OMNIPAQUE IOHEXOL 300 MG/ML  SOLN COMPARISON:  CT 09/23/2016 and PET-CT 10/21/2017 FINDINGS: Cardiovascular: Heart size is normal. Calcified plaque over the left main and 3 vessel coronary arteries. Calcified plaque over the thoracic aorta. Remaining vascular structures are unremarkable. Mediastinum/Nodes: 1.2 cm high pretracheal lymph node unchanged. 2.7 cm right paratracheal lymph node slightly smaller compared to June 2019 and stable compared to the recent CT from 10/21/2017. 8 mm right periaortic lymph node slightly smaller. No significant hilar adenopathy. Subcentimeter right pericardiophrenic lymph node unchanged. Remaining mediastinal structures are unremarkable.  Lungs/Pleura: Lungs are adequately inflated and demonstrate 8 mm (previously 1.1 cm) irregular nodule over the right apex which measures approximately 3 cm craniocaudal dimension which is unchanged. Slight decrease in size of 2 subcentimeter nodules adjacent the minor fissure measuring 5-6 mm each. Stable peripheral subcentimeter nodule over the right middle lobe. No new nodules identified. New small bilateral pleural effusions with associated bibasilar atelectasis. Minimal linear density over the left mainstem bronchus. Airways are otherwise unremarkable. Upper Abdomen: Several liver hypodensities most prominent over the posterior right lobe without significant change compatible with known liver metastases. Stable cyst over the upper pole left kidney. Mild ascites which is new. Mild subcutaneous edema. Musculoskeletal: Degenerative change of the spine. Subcentimeter lucency along the left posterior aspect of an upper thoracic vertebral body as well as subcentimeter lucency over a lower thoracic vertebral body unchanged and may represent small hemangiomas versus metastatic disease. IMPRESSION: Known lung cancer over the right apex with stable 3 cm craniocaudal dimension and slight decreased transverse dimension of 8 mm (previously 1.1 cm). Slight interval decrease in size of 2 subcentimeter nodules adjacent the minor fissure. Stable subcentimeter peripheral nodule right middle lobe. Stable mediastinal adenopathy compared to 10/21/2017. No hilar adenopathy. New small bilateral pleural effusions with associated bibasilar atelectasis. Stable known liver metastases worse over the posterior right lobe. New mild ascites. Two subcentimeter lucencies as described over and upper and lower thoracic vertebral body unchanged and which may be small hemangiomas, although metastatic disease is possible. Stable left renal cyst. Left main and 3 vessel atherosclerotic coronary artery disease. Aortic Atherosclerosis (ICD10-I70.0).  Electronically Signed   By: Marin Olp M.D.   On: 11/19/2017 17:51   Ct Angio Chest Pe W Or Wo Contrast  Result Date: 11/26/2017 CLINICAL DATA:  Shortness of breath. History of small cell lung cancer, currently on chemotherapy. EXAM: CT ANGIOGRAPHY CHEST WITH CONTRAST TECHNIQUE: Multidetector CT imaging of the chest was performed using the standard protocol during bolus administration of intravenous contrast. Multiplanar CT image reconstructions and MIPs were obtained to evaluate the vascular anatomy. CONTRAST:  128mL ISOVUE-370 IOPAMIDOL (ISOVUE-370) INJECTION 76% COMPARISON:  CT chest dated November 19, 2017. FINDINGS: Cardiovascular: Suboptimal opacification of the pulmonary arteries due to bolus timing. No central or lobar pulmonary embolism. The segmental pulmonary arteries are not well evaluated. Normal heart size. No pericardial effusion. Normal caliber thoracic aorta. Coronary, aortic arch, and branch vessel atherosclerotic vascular disease. Mediastinum/Nodes: Unchanged enlarged 1.2 cm and 2.7 cm right paratracheal lymph nodes. Unchanged 9 mm right periaortic lymph node. No hilar or axillary lymphadenopathy. The thyroid gland, trachea, and esophagus are unremarkable. Lungs/Pleura: Unchanged 8 mm irregular nodule within the right lung apex. Increased linear atelectasis in the right lung apex. One 6 mm nodule along the minor fissure is unchanged in  size. The other previously seen nodule along the minor fissure is no longer identified. No new pulmonary nodule. Slight interval increase in size of small right pleural effusion with adjacent right lower lobe subsegmental atelectasis. Unchanged small left pleural effusion. No consolidation or pneumothorax. Upper Abdomen: Nodular liver contour suggestive of cirrhosis. Hypodense metastases within the posterior right hepatic lobe are not as well evaluated on today's study, but appear grossly unchanged. Small ascites, unchanged. Musculoskeletal: Unchanged  subcentimeter lucent lesions in the T5 and T9 vertebral bodies. No acute osseous finding. Review of the MIP images confirms the above findings. IMPRESSION: 1. Suboptimal opacification of the pulmonary arteries. No central or lobar pulmonary embolism. 2. Slight interval increase in size of small right pleural effusion. Unchanged small left pleural effusion. 3. Unchanged right apical lung cancer. Stable mediastinal adenopathy and hepatic metastases. 4. Nodular liver contour, suggestive of cirrhosis. Unchanged small ascites. 5.  Aortic atherosclerosis (ICD10-I70.0). Electronically Signed   By: Titus Dubin M.D.   On: 11/26/2017 18:55   Dg Chest Port 1 View  Result Date: 11/26/2017 CLINICAL DATA:  Short of breath EXAM: PORTABLE CHEST 1 VIEW COMPARISON:  11/24/2017, 11/08/2017, CT chest 11/19/2017, PET-CT 10/21/2017 FINDINGS: Hazy bibasilar opacity. Stable cardiomediastinal silhouette with aortic atherosclerosis. No pneumothorax. CT demonstrated right apical lung nodule not well seen radiographically. IMPRESSION: Hazy bibasilar opacity, may reflect atelectasis or infiltrate. Electronically Signed   By: Donavan Foil M.D.   On: 11/26/2017 17:02    ASSESSMENT AND PLAN: This is a very pleasant 72 years old white male diagnosed with extensive stage small cell lung cancer presented with right upper lobe lung mass in addition to pulmonary nodules, mediastinal lymphadenopathy and extensive liver metastasis in June 2019. The patient is currently undergoing systemic chemotherapy with carboplatin and etoposide status post 3 cycles. The patient continues to tolerate this treatment well with no concerning complaints except for the aching pain after the Neulasta injection. His recent CT scan of the chest showed no concerning findings for disease progression. I discussed the scan results with the patient and his family. I recommended for him to continue his current systemic chemotherapy with carboplatin and etoposide  and he will proceed with cycle #4 today. The patient will come back for follow-up visit in 3 weeks for reevaluation before starting cycle #5. We will arrange for the patient to have Port-A-Cath placed for IV access for his chemotherapy. He was advised to call immediately if he has any concerning symptoms in the interval. The patient voices understanding of current disease status and treatment options and is in agreement with the current care plan. All questions were answered. The patient knows to call the clinic with any problems, questions or concerns. We can certainly see the patient much sooner if necessary.  I spent 15 minutes counseling the patient face to face. The total time spent in the appointment was 25 minutes.  Disclaimer: This note was dictated with voice recognition software. Similar sounding words can inadvertently be transcribed and may not be corrected upon review.

## 2017-12-09 NOTE — Patient Instructions (Addendum)
[facility] Discharge Instructions for Patients Receiving Chemotherapy  Today you received the following chemotherapy agents: Carboplatin and Etoposide.  To help prevent nausea and vomiting after your treatment, we encourage you to take your nausea medication as directed   If you develop nausea and vomiting that is not controlled by your nausea medication, call the clinic.   BELOW ARE SYMPTOMS THAT SHOULD BE REPORTED IMMEDIATELY:  *FEVER GREATER THAN 100.5 F  *CHILLS WITH OR WITHOUT FEVER  NAUSEA AND VOMITING THAT IS NOT CONTROLLED WITH YOUR NAUSEA MEDICATION  *UNUSUAL SHORTNESS OF BREATH  *UNUSUAL BRUISING OR BLEEDING  TENDERNESS IN MOUTH AND THROAT WITH OR WITHOUT PRESENCE OF ULCERS  *URINARY PROBLEMS  *BOWEL PROBLEMS  UNUSUAL RASH Items with * indicate a potential emergency and should be followed up as soon as possible.  Feel free to call the clinic should you have any questions or concerns. The clinic phone number is [phone].  Please show the [facility] at check-in to the Emergency Department and triage nurse.

## 2017-12-09 NOTE — Progress Notes (Signed)
12/09/17 @ 1200  Contacted Dr Julien Nordmann about Carboplatin dose for today 750 mg.  Diane RN spoke with Dr Julien Nordmann and he confirmed to give Carboplatin at 750 mg today.  Scr is 1.07 today. New dose is AUC 5  T.O. Dr Barnet Pall RN/Lorrie Gargan Ronnald Ramp, PharmD

## 2017-12-10 ENCOUNTER — Inpatient Hospital Stay: Payer: BLUE CROSS/BLUE SHIELD

## 2017-12-10 VITALS — BP 124/70 | HR 93 | Temp 98.0°F | Resp 16

## 2017-12-10 DIAGNOSIS — Z5111 Encounter for antineoplastic chemotherapy: Secondary | ICD-10-CM | POA: Diagnosis not present

## 2017-12-10 DIAGNOSIS — C3490 Malignant neoplasm of unspecified part of unspecified bronchus or lung: Secondary | ICD-10-CM

## 2017-12-10 MED ORDER — DEXAMETHASONE SODIUM PHOSPHATE 10 MG/ML IJ SOLN
INTRAMUSCULAR | Status: AC
  Filled 2017-12-10: qty 1

## 2017-12-10 MED ORDER — SODIUM CHLORIDE 0.9 % IV SOLN
Freq: Once | INTRAVENOUS | Status: AC
  Administered 2017-12-10: 09:00:00 via INTRAVENOUS
  Filled 2017-12-10: qty 250

## 2017-12-10 MED ORDER — SODIUM CHLORIDE 0.9 % IV SOLN
90.0000 mg/m2 | Freq: Once | INTRAVENOUS | Status: AC
  Administered 2017-12-10: 250 mg via INTRAVENOUS
  Filled 2017-12-10: qty 12.5

## 2017-12-10 MED ORDER — DEXAMETHASONE SODIUM PHOSPHATE 10 MG/ML IJ SOLN
10.0000 mg | Freq: Once | INTRAMUSCULAR | Status: AC
  Administered 2017-12-10: 10 mg via INTRAVENOUS

## 2017-12-10 NOTE — Patient Instructions (Signed)
[facility] Discharge Instructions for Patients Receiving Chemotherapy  Today you received the following chemotherapy agent: Etoposide.  To help prevent nausea and vomiting after your treatment, we encourage you to take your nausea medication as directed   If you develop nausea and vomiting that is not controlled by your nausea medication, call the clinic.   BELOW ARE SYMPTOMS THAT SHOULD BE REPORTED IMMEDIATELY:  *FEVER GREATER THAN 100.5 F  *CHILLS WITH OR WITHOUT FEVER  NAUSEA AND VOMITING THAT IS NOT CONTROLLED WITH YOUR NAUSEA MEDICATION  *UNUSUAL SHORTNESS OF BREATH  *UNUSUAL BRUISING OR BLEEDING  TENDERNESS IN MOUTH AND THROAT WITH OR WITHOUT PRESENCE OF ULCERS  *URINARY PROBLEMS  *BOWEL PROBLEMS  UNUSUAL RASH Items with * indicate a potential emergency and should be followed up as soon as possible.  Feel free to call the clinic should you have any questions or concerns. The clinic phone number is [phone].  Please show the [facility] at check-in to the Emergency Department and triage nurse.

## 2017-12-11 ENCOUNTER — Telehealth: Payer: Self-pay | Admitting: Medical Oncology

## 2017-12-11 ENCOUNTER — Inpatient Hospital Stay: Payer: BLUE CROSS/BLUE SHIELD

## 2017-12-11 VITALS — BP 117/66 | HR 85 | Temp 98.2°F | Resp 16

## 2017-12-11 DIAGNOSIS — C3490 Malignant neoplasm of unspecified part of unspecified bronchus or lung: Secondary | ICD-10-CM

## 2017-12-11 DIAGNOSIS — Z5111 Encounter for antineoplastic chemotherapy: Secondary | ICD-10-CM | POA: Diagnosis not present

## 2017-12-11 MED ORDER — DEXAMETHASONE SODIUM PHOSPHATE 10 MG/ML IJ SOLN
INTRAMUSCULAR | Status: AC
  Filled 2017-12-11: qty 1

## 2017-12-11 MED ORDER — SODIUM CHLORIDE 0.9 % IV SOLN
90.0000 mg/m2 | Freq: Once | INTRAVENOUS | Status: AC
  Administered 2017-12-11: 250 mg via INTRAVENOUS
  Filled 2017-12-11: qty 12.5

## 2017-12-11 MED ORDER — DEXAMETHASONE SODIUM PHOSPHATE 10 MG/ML IJ SOLN
10.0000 mg | Freq: Once | INTRAMUSCULAR | Status: AC
  Administered 2017-12-11: 10 mg via INTRAVENOUS

## 2017-12-11 MED ORDER — SODIUM CHLORIDE 0.9 % IV SOLN
Freq: Once | INTRAVENOUS | Status: AC
  Administered 2017-12-11: 09:00:00 via INTRAVENOUS
  Filled 2017-12-11: qty 250

## 2017-12-11 NOTE — Telephone Encounter (Signed)
Asking if he can take prilosec with his plavix.Left message that it is okay to take together.

## 2017-12-11 NOTE — Patient Instructions (Signed)
[facility] Discharge Instructions for Patients Receiving Chemotherapy  Today you received the following chemotherapy agent: Etoposide.  To help prevent nausea and vomiting after your treatment, we encourage you to take your nausea medication as directed   If you develop nausea and vomiting that is not controlled by your nausea medication, call the clinic.   BELOW ARE SYMPTOMS THAT SHOULD BE REPORTED IMMEDIATELY:  *FEVER GREATER THAN 100.5 F  *CHILLS WITH OR WITHOUT FEVER  NAUSEA AND VOMITING THAT IS NOT CONTROLLED WITH YOUR NAUSEA MEDICATION  *UNUSUAL SHORTNESS OF BREATH  *UNUSUAL BRUISING OR BLEEDING  TENDERNESS IN MOUTH AND THROAT WITH OR WITHOUT PRESENCE OF ULCERS  *URINARY PROBLEMS  *BOWEL PROBLEMS  UNUSUAL RASH Items with * indicate a potential emergency and should be followed up as soon as possible.  Feel free to call the clinic should you have any questions or concerns. The clinic phone number is [phone].  Please show the [facility] at check-in to the Emergency Department and triage nurse.

## 2017-12-13 ENCOUNTER — Emergency Department (HOSPITAL_COMMUNITY): Payer: BLUE CROSS/BLUE SHIELD

## 2017-12-13 ENCOUNTER — Other Ambulatory Visit: Payer: Self-pay

## 2017-12-13 ENCOUNTER — Telehealth: Payer: Self-pay | Admitting: *Deleted

## 2017-12-13 ENCOUNTER — Inpatient Hospital Stay (HOSPITAL_COMMUNITY)
Admission: EM | Admit: 2017-12-13 | Discharge: 2017-12-24 | DRG: 435 | Disposition: A | Discharge status: Home Health | Payer: BLUE CROSS/BLUE SHIELD | Attending: Family Medicine | Admitting: Family Medicine

## 2017-12-13 ENCOUNTER — Encounter (HOSPITAL_COMMUNITY): Payer: Self-pay | Admitting: *Deleted

## 2017-12-13 ENCOUNTER — Inpatient Hospital Stay: Payer: BLUE CROSS/BLUE SHIELD

## 2017-12-13 VITALS — BP 124/64 | HR 100 | Temp 98.3°F | Resp 18

## 2017-12-13 DIAGNOSIS — M1990 Unspecified osteoarthritis, unspecified site: Secondary | ICD-10-CM | POA: Diagnosis present

## 2017-12-13 DIAGNOSIS — Z888 Allergy status to other drugs, medicaments and biological substances status: Secondary | ICD-10-CM

## 2017-12-13 DIAGNOSIS — R5081 Fever presenting with conditions classified elsewhere: Secondary | ICD-10-CM | POA: Diagnosis present

## 2017-12-13 DIAGNOSIS — R609 Edema, unspecified: Secondary | ICD-10-CM | POA: Diagnosis present

## 2017-12-13 DIAGNOSIS — T458X5A Adverse effect of other primarily systemic and hematological agents, initial encounter: Secondary | ICD-10-CM | POA: Diagnosis present

## 2017-12-13 DIAGNOSIS — R188 Other ascites: Secondary | ICD-10-CM

## 2017-12-13 DIAGNOSIS — C3490 Malignant neoplasm of unspecified part of unspecified bronchus or lung: Secondary | ICD-10-CM

## 2017-12-13 DIAGNOSIS — T451X5A Adverse effect of antineoplastic and immunosuppressive drugs, initial encounter: Secondary | ICD-10-CM | POA: Diagnosis present

## 2017-12-13 DIAGNOSIS — Z800 Family history of malignant neoplasm of digestive organs: Secondary | ICD-10-CM

## 2017-12-13 DIAGNOSIS — E785 Hyperlipidemia, unspecified: Secondary | ICD-10-CM | POA: Diagnosis present

## 2017-12-13 DIAGNOSIS — I10 Essential (primary) hypertension: Secondary | ICD-10-CM | POA: Diagnosis present

## 2017-12-13 DIAGNOSIS — E119 Type 2 diabetes mellitus without complications: Secondary | ICD-10-CM

## 2017-12-13 DIAGNOSIS — K652 Spontaneous bacterial peritonitis: Secondary | ICD-10-CM

## 2017-12-13 DIAGNOSIS — C3411 Malignant neoplasm of upper lobe, right bronchus or lung: Secondary | ICD-10-CM | POA: Diagnosis present

## 2017-12-13 DIAGNOSIS — Z96651 Presence of right artificial knee joint: Secondary | ICD-10-CM | POA: Diagnosis present

## 2017-12-13 DIAGNOSIS — C787 Secondary malignant neoplasm of liver and intrahepatic bile duct: Secondary | ICD-10-CM | POA: Diagnosis not present

## 2017-12-13 DIAGNOSIS — K219 Gastro-esophageal reflux disease without esophagitis: Secondary | ICD-10-CM | POA: Diagnosis present

## 2017-12-13 DIAGNOSIS — D72829 Elevated white blood cell count, unspecified: Secondary | ICD-10-CM | POA: Diagnosis present

## 2017-12-13 DIAGNOSIS — Z7951 Long term (current) use of inhaled steroids: Secondary | ICD-10-CM

## 2017-12-13 DIAGNOSIS — E8770 Fluid overload, unspecified: Secondary | ICD-10-CM

## 2017-12-13 DIAGNOSIS — Z87891 Personal history of nicotine dependence: Secondary | ICD-10-CM

## 2017-12-13 DIAGNOSIS — I471 Supraventricular tachycardia: Secondary | ICD-10-CM | POA: Diagnosis not present

## 2017-12-13 DIAGNOSIS — D649 Anemia, unspecified: Secondary | ICD-10-CM | POA: Diagnosis not present

## 2017-12-13 DIAGNOSIS — C7951 Secondary malignant neoplasm of bone: Secondary | ICD-10-CM | POA: Diagnosis present

## 2017-12-13 DIAGNOSIS — R945 Abnormal results of liver function studies: Secondary | ICD-10-CM | POA: Diagnosis not present

## 2017-12-13 DIAGNOSIS — E43 Unspecified severe protein-calorie malnutrition: Secondary | ICD-10-CM | POA: Diagnosis not present

## 2017-12-13 DIAGNOSIS — R140 Abdominal distension (gaseous): Secondary | ICD-10-CM | POA: Diagnosis not present

## 2017-12-13 DIAGNOSIS — D61810 Antineoplastic chemotherapy induced pancytopenia: Secondary | ICD-10-CM | POA: Diagnosis present

## 2017-12-13 DIAGNOSIS — Z794 Long term (current) use of insulin: Secondary | ICD-10-CM

## 2017-12-13 DIAGNOSIS — Z881 Allergy status to other antibiotic agents status: Secondary | ICD-10-CM

## 2017-12-13 DIAGNOSIS — Z833 Family history of diabetes mellitus: Secondary | ICD-10-CM

## 2017-12-13 DIAGNOSIS — Z6833 Body mass index (BMI) 33.0-33.9, adult: Secondary | ICD-10-CM

## 2017-12-13 DIAGNOSIS — E440 Moderate protein-calorie malnutrition: Secondary | ICD-10-CM

## 2017-12-13 DIAGNOSIS — Z5111 Encounter for antineoplastic chemotherapy: Secondary | ICD-10-CM | POA: Diagnosis not present

## 2017-12-13 DIAGNOSIS — Z87892 Personal history of anaphylaxis: Secondary | ICD-10-CM

## 2017-12-13 LAB — CBC WITH DIFFERENTIAL/PLATELET
Basophils Absolute: 0 10*3/uL (ref 0.0–0.1)
Basophils Relative: 0 %
EOS ABS: 0 10*3/uL (ref 0.0–0.7)
EOS PCT: 0 %
HCT: 25.4 % — ABNORMAL LOW (ref 39.0–52.0)
HEMOGLOBIN: 8.5 g/dL — AB (ref 13.0–17.0)
LYMPHS ABS: 0.9 10*3/uL (ref 0.7–4.0)
Lymphocytes Relative: 4 %
MCH: 33.5 pg (ref 26.0–34.0)
MCHC: 33.5 g/dL (ref 30.0–36.0)
MCV: 100 fL (ref 78.0–100.0)
MONO ABS: 0 10*3/uL — AB (ref 0.1–1.0)
MONOS PCT: 0 %
Neutro Abs: 22.8 10*3/uL — ABNORMAL HIGH (ref 1.7–7.7)
Neutrophils Relative %: 96 %
PLATELETS: 82 10*3/uL — AB (ref 150–400)
RBC: 2.54 MIL/uL — ABNORMAL LOW (ref 4.22–5.81)
RDW: 18.6 % — AB (ref 11.5–15.5)
WBC: 23.8 10*3/uL — ABNORMAL HIGH (ref 4.0–10.5)

## 2017-12-13 LAB — URINALYSIS, ROUTINE W REFLEX MICROSCOPIC
Bilirubin Urine: NEGATIVE
GLUCOSE, UA: NEGATIVE mg/dL
HGB URINE DIPSTICK: NEGATIVE
KETONES UR: NEGATIVE mg/dL
LEUKOCYTES UA: NEGATIVE
Nitrite: NEGATIVE
PH: 5 (ref 5.0–8.0)
Protein, ur: NEGATIVE mg/dL
Specific Gravity, Urine: 1.008 (ref 1.005–1.030)

## 2017-12-13 LAB — COMPREHENSIVE METABOLIC PANEL
ALT: 33 U/L (ref 0–44)
AST: 51 U/L — AB (ref 15–41)
Albumin: 2.6 g/dL — ABNORMAL LOW (ref 3.5–5.0)
Alkaline Phosphatase: 120 U/L (ref 38–126)
Anion gap: 13 (ref 5–15)
BUN: 21 mg/dL (ref 8–23)
CHLORIDE: 98 mmol/L (ref 98–111)
CO2: 26 mmol/L (ref 22–32)
CREATININE: 0.85 mg/dL (ref 0.61–1.24)
Calcium: 8.1 mg/dL — ABNORMAL LOW (ref 8.9–10.3)
GFR calc Af Amer: >60 mL/min (ref >60)
GFR calc non Af Amer: >60 mL/min (ref >60)
GLUCOSE: 152 mg/dL — AB (ref 70–99)
Potassium: 4 mmol/L (ref 3.5–5.1)
SODIUM: 137 mmol/L (ref 135–145)
Total Bilirubin: 1.8 mg/dL — ABNORMAL HIGH (ref 0.3–1.2)
Total Protein: 6 g/dL — ABNORMAL LOW (ref 6.5–8.1)

## 2017-12-13 LAB — I-STAT TROPONIN, ED: TROPONIN I, POC: 0.01 ng/mL (ref 0.00–0.08)

## 2017-12-13 LAB — BRAIN NATRIURETIC PEPTIDE: B NATRIURETIC PEPTIDE 5: 63.1 pg/mL (ref 0.0–100.0)

## 2017-12-13 LAB — PROTIME-INR
INR: 1.07
Prothrombin Time: 13.8 seconds (ref 11.4–15.2)

## 2017-12-13 LAB — LIPASE, BLOOD: LIPASE: 50 U/L (ref 11–51)

## 2017-12-13 LAB — TROPONIN I: Troponin I: <0.03 ng/mL (ref <0.03)

## 2017-12-13 MED ORDER — PEGFILGRASTIM-CBQV 6 MG/0.6ML ~~LOC~~ SOSY
6.0000 mg | PREFILLED_SYRINGE | Freq: Once | SUBCUTANEOUS | Status: AC
  Administered 2017-12-13: 6 mg via SUBCUTANEOUS

## 2017-12-13 MED ORDER — PEGFILGRASTIM-CBQV 6 MG/0.6ML ~~LOC~~ SOSY
PREFILLED_SYRINGE | SUBCUTANEOUS | Status: AC
  Filled 2017-12-13: qty 0.6

## 2017-12-13 MED ORDER — FUROSEMIDE 10 MG/ML IJ SOLN
60.0000 mg | Freq: Once | INTRAMUSCULAR | Status: DC
  Filled 2017-12-13: qty 8

## 2017-12-13 NOTE — H&P (Signed)
TRH H&P   Patient Demographics:    Robert Compton, is a 72 y.o. male  MRN: [id number]   DOB - 1945/07/17  Admit Date - 12/13/2017  Outpatient Primary MD for the patient is Avva, Steva Ready, MD  Referring MD/NP/PA:   Lennice Sites  Outpatient Specialists:    [facility]  Patient coming from: home  Chief Complaint  Patient presents with  . Weight Gain      HPI:    Robert Compton  is a 72 y.o. male, w metastatic small cell lung cancer to liver and cervical spine , severe protein calorie malnutrition, h/o pancytopenia. Presents with 10 lbs of weight gain and increase in lower ext edema.   In ED,  CXR IMPRESSION: Interstitial opacities may represent mild edema.   Na 137, K 4.0, Bun 21, Creatinine 0.85 Alb 2.6,  Ast 51, Alt 33, Alk phos 120, T. Bili 1.8  Wbc 23.8, Hgb 8.5, Plt 82 INR 1.07 Lipase 50  Trop <0.03  EKG nsr at 98, nl axis, early R progression, no st-t changes c/w ischemia  Pt will be admitted for edema secondary to severe protein calorie malnutrition    Review of systems:    In addition to the HPI above,  No Fever-chills, No Headache, No changes with Vision or hearing, No problems swallowing food or Liquids, No Chest pain, Cough or Shortness of Breath, No Abdominal pain, No Nausea or Vommitting, Bowel movements are regular, No Blood in stool or Urine, No dysuria, No new skin rashes or bruises, No new joints pains-aches,  No new weakness, tingling, numbness in any extremity, No recent weight gain or loss, No polyuria, polydypsia or polyphagia, No significant Mental Stressors.  A full 10 point Review of Systems was done, except as stated above, all other Review of Systems were negative.   With Past History of the following :    Past Medical History:  Diagnosis Date  . Arthritis   . Cancer ([place])   . Diabetes mellitus without  complication ([place])   . Hypertension       Past Surgical History:  Procedure Laterality Date  . HERNIA REPAIR    . JOINT REPLACEMENT Right    knee  . LIVER BIOPSY        Social History:     Social History   Tobacco Use  . Smoking status: Former Smoker    Types: Cigarettes    Last attempt to quit: 09/20/2017    Years since quitting: 0.2  . Smokeless tobacco: Never Used  Substance Use Topics  . Alcohol use: No     Lives - at home  Mobility - walks by self   Family History :     Family History  Problem Relation Age of Onset  . Diabetes Mother   . Dementia Mother   . Colon cancer Father   . CAD Neg Hx  Home Medications:   Prior to Admission medications   Medication Sig Start Date End Date Taking? Authorizing Provider  CINNAMON PO Take 600 mg by mouth 2 (two) times daily.   Yes [provider]  fish oil-omega-3 fatty acids 1000 MG capsule Take 1 g by mouth 2 (two) times daily.   Yes [provider]  furosemide (LASIX) 20 MG tablet Take 2 tablets (40 mg total) by mouth 2 (two) times daily. 12/02/17  Yes Dessa Phi, DO  loratadine (CLARITIN) 10 MG tablet Take 1 tablet (10 mg total) by mouth daily. 11/13/17  Yes Eugenie Filler, MD  metFORMIN (GLUCOPHAGE) 1000 MG tablet Take 1,000 mg by mouth 2 (two) times daily with a meal.   Yes [provider]  nicotine (NICODERM CQ - DOSED IN MG/24 HOURS) 21 mg/24hr patch Place 1 patch (21 mg total) onto the skin daily. 11/12/17  Yes Eugenie Filler, MD  omeprazole (PRILOSEC) 20 MG capsule Take 20 mg by mouth daily.   Yes [provider]  Potassium Chloride ER 20 MEQ TBCR Take 20 mEq by mouth 2 (two) times daily. 12/02/17  Yes Dessa Phi, DO  Red Yeast Rice 600 MG TABS Take 1 tablet by mouth 2 (two) times daily.    Yes [provider]  fluticasone (FLONASE) 50 MCG/ACT nasal spray Place 2 sprays into both nostrils daily as needed for allergies. 11/12/17   Eugenie Filler,  MD  insulin aspart (NOVOLOG) 100 UNIT/ML injection CBG < 70: implement hypoglycemia protocol CBG 70 - 120: 0 units CBG 121 - 150: 2 units CBG 151 - 200: 3 units CBG 201 - 250: 5 units CBG 251 - 300: 8 units CBG 301 - 350: 11 units CBG 351 - 400: 15 units CBG > 400: call MD 10/08/17   Lavina Hamman, MD  polyethylene glycol (MIRALAX / Floria Raveling) packet Take 17 g by mouth daily. 10/09/17   Lavina Hamman, MD  traMADol (ULTRAM) 50 MG tablet Take 1 tablet (50 mg total) by mouth every 6 (six) hours as needed for moderate pain. Patient not taking: Reported on 12/09/2017 10/08/17   Lavina Hamman, MD     Allergies:     Allergies  Allergen Reactions  . Amoxicillin-Pot Clavulanate Anaphylaxis    Has patient had a PCN reaction causing immediate rash, facial/tongue/throat swelling, SOB or lightheadedness with hypotension: Yes Has patient had a PCN reaction causing severe rash involving mucus membranes or skin necrosis: Yes Has patient had a PCN reaction that required hospitalization: Yes Has patient had a PCN reaction occurring within the last 10 years: Yes If all of the above answers are "NO", then may proceed with Cephalosporin use.   . Nitrofurantoin Anaphylaxis     Physical Exam:   Vitals  Blood pressure 115/68, pulse 97, temperature 98.7 F (37.1 C), temperature source Oral, resp. rate 16, SpO2 100 %.   1. General  lying in bed in NAD,   2. Normal affect and insight, Not Suicidal or Homicidal, Awake Alert, Oriented X 3.  3. No F.N deficits, ALL C.Nerves Intact, Strength 5/5 all 4 extremities, Sensation intact all 4 extremities, Plantars down going.  4. Ears and Eyes appear Normal, Conjunctivae clear, PERRLA. Moist Oral Mucosa.  5. Supple Neck, No JVD, No cervical lymphadenopathy appriciated, No Carotid Bruits.  6. Symmetrical Chest wall movement, Good air movement bilaterally, CTAB.  7. RRR, No Gallops, Rubs or Murmurs, No Parasternal Heave.  8. Positive Bowel Sounds, Abdomen  Soft, No  tenderness, No organomegaly appriciated,No rebound -guarding or rigidity.  9.  No Cyanosis, Normal Skin Turgor, No Skin Rash or Bruise.  10. Good muscle tone,  joints appear normal , no effusions, Normal ROM.  11. No Palpable Lymph Nodes in Neck or Axillae    Data Review:    CBC Recent Labs  Lab 12/09/17 1002 12/13/17 2211  WBC 9.7 23.8*  HGB 9.2* 8.5*  HCT 27.1* 25.4*  PLT 129* 82*  MCV 98.7* 100.0  MCH 33.4 33.5  MCHC 33.9 33.5  RDW 20.2* 18.6*  LYMPHSABS 1.0 0.9  MONOABS 1.1* 0.0*  EOSABS 0.1 0.0  BASOSABS 0.1 0.0   ------------------------------------------------------------------------------------------------------------------  Chemistries  Recent Labs  Lab 12/09/17 1002 12/13/17 2211  NA 138 137  K 3.5 4.0  CL 98 98  CO2 29 26  GLUCOSE 155* 152*  BUN 13 21  CREATININE 1.07 0.85  CALCIUM 8.1* 8.1*  AST 44* 51*  ALT 30 33  ALKPHOS 172* 120  BILITOT 2.4* 1.8*   ------------------------------------------------------------------------------------------------------------------ estimated creatinine clearance is 116.8 mL/min (by C-G formula based on SCr of 0.85 mg/dL). ------------------------------------------------------------------------------------------------------------------ No results for input(s): TSH, T4TOTAL, T3FREE, THYROIDAB in the last 72 hours.  Invalid input(s): FREET3  Coagulation profile Recent Labs  Lab 12/13/17 2211  INR 1.07   ------------------------------------------------------------------------------------------------------------------- No results for input(s): DDIMER in the last 72 hours. -------------------------------------------------------------------------------------------------------------------  Cardiac Enzymes Recent Labs  Lab 12/13/17 2211  TROPONINI <0.03   ------------------------------------------------------------------------------------------------------------------    Component Value Date/Time     BNP 63.1 12/13/2017 2211     ---------------------------------------------------------------------------------------------------------------  Urinalysis    Component Value Date/Time   COLORURINE YELLOW 12/13/2017 2222   APPEARANCEUR CLEAR 12/13/2017 2222   LABSPEC 1.008 12/13/2017 2222   PHURINE 5.0 12/13/2017 2222   GLUCOSEU NEGATIVE 12/13/2017 2222   HGBUR NEGATIVE 12/13/2017 2222   BILIRUBINUR NEGATIVE 12/13/2017 2222   KETONESUR NEGATIVE 12/13/2017 2222   PROTEINUR NEGATIVE 12/13/2017 2222   NITRITE NEGATIVE 12/13/2017 2222   LEUKOCYTESUR NEGATIVE 12/13/2017 2222    ----------------------------------------------------------------------------------------------------------------   Imaging Results:    Dg Chest Port 1 View  Result Date: 12/13/2017 CLINICAL DATA:  Patient with 10 pound weight gain. Shortness of breath. EXAM: PORTABLE CHEST 1 VIEW COMPARISON:  Chest radiograph 12/03/2017 FINDINGS: Stable cardiac and mediastinal contours. Bilateral interstitial pulmonary opacities. Low lung volumes. No definite pleural effusion or pneumothorax. IMPRESSION: Interstitial opacities may represent mild edema. Electronically Signed   By: Lovey Newcomer M.D.   On: 12/13/2017 20:50       Assessment & Plan:    Principal Problem:   Edema Active Problems:   DM (diabetes mellitus), type 2 (HCC)   Anemia   Severe protein-calorie malnutrition (HCC)   Abnormal liver function   Leukocytosis   Edema His oral lasix was not working due to severe protein  Calorie malnutrition Trial Lasix 81m iv bid Try Torsemide or Bumetanide when converting to oral diuretic  Severe protein calorie malnutrition prostat 30 mL po bid  Abnormal liver function Due to metastatic disease to liver Check cmp in am  Leukocytosis probably due to neulasta Check cbc in am  Anemia/ h/o pancytopenia Check cbc in am  Dm2 Hold off on Metformin since increase diuresis could affect creatinine and Metformin is  renally excreted FSBS ac and qhs, ISS  Gerd Cont PPI  Hyperlipidemia STOP Red Yeast Rice due to abnormal liver function     DVT Prophylaxis  Lovenox - SCDs   AM Labs Ordered, also please review Full Orders  Family  Communication: Admission, patients condition and plan of care including tests being ordered have been discussed with the patient  who indicate understanding and agree with the plan and Code Status.  Code Status  FULL CODE  Likely DC to  Home  Condition GUARDED    Consults called:  none  Admission status: observation, pt will require admission to hospital for iv diuretic. Since oral was not successful.  Pt will likely need less than 2 nite stay however if not improving rapidly enough might need longer inpatient stay.  Time spent in minutes : 70   [name] M.D on 12/13/2017 at 11:49 PM  Between 7am to 7pm - Pager - [phone] . After 7pm go to [email or web address] - password [facility]  [facility] - Office  [phone]

## 2017-12-13 NOTE — Telephone Encounter (Signed)
Pt daughter [name] called states pt has had approx 10-12lbs weight gain since Monday 8/23.  Pt currently on Chemotherapy Carboplatin and Etoposide 8/26,27,28, received neulasta today 8/30 at [facility].  Pt is having increased shortness of breath, unable to button pants due to abdominal swelling, Lower extremities also have mild swelling.  Pt currently on 40mg  Lasix BID, pt advised daughter he has not had to urinate as often. MD out of office, reviewed with Sandi Mealy, PA who instructed to be seen in ED as he may need paracentesis to pull fluid off abdomen.  Discussed with [name] importance of getting pt evaluated as pt is retaining fluid and breathing could become worse. [name] verbalized she will take Pt to ED for further evaluation.

## 2017-12-13 NOTE — ED Provider Notes (Addendum)
[facility] DEPT Provider Note   CSN: [id number] Arrival date & time: 12/13/17  2006     History   Chief Complaint Chief Complaint  Patient presents with  . Weight Gain    HPI Robert Compton is a 72 y.o. male.  Patient with history of primary lung cancer with metastasis to the liver, heart failure who presents the ED with weight gain.  Patient states that he is gained about 14 pounds over the last several days.  Has increased shortness of breath with ambulation.  Patient admitted to the hospital for volume overload recently and had his Lasix dose changed to 40 mg twice a day.  Patient states that he is making good urine but continues to have swelling in his legs and throughout his body.  Patient currently on chemotherapy.  Denies any fever, chills.  The history is provided by the patient.  Illness  This is a chronic problem. The current episode started more than 2 days ago. The problem occurs daily. The problem has been gradually worsening. Associated symptoms include shortness of breath. Pertinent negatives include no chest pain, no abdominal pain and no headaches. The symptoms are aggravated by walking. The symptoms are relieved by medications. He has tried nothing for the symptoms. The treatment provided no relief.    Past Medical History:  Diagnosis Date  . Arthritis   . Cancer ([place])   . Diabetes mellitus without complication ([place])   . Hypertension     Patient Active Problem List   Diagnosis Date Noted  . SBP (spontaneous bacterial peritonitis) ([place])   . Abdominal distention   . Hypervolemia   . Edema 12/13/2017  . Severe protein-calorie malnutrition ([place]) 12/13/2017  . Abnormal liver function 12/13/2017  . Leukocytosis 12/13/2017  . Acute on chronic diastolic (congestive) heart failure ([place]) 11/27/2017  . Tachycardia 11/26/2017  . Anemia 11/26/2017  . Leg edema, right 11/26/2017  . Dehydration   . Pressure injury of skin 11/09/2017    . Hyperbilirubinemia   . Liver metastasis ([place])   . Neutropenic fever ([place])   . Pancytopenia ([place])   . Febrile neutropenia ([place]) 11/08/2017  . Severe sepsis ([place]) 11/08/2017  . Antineoplastic chemotherapy induced pancytopenia ([place]) 11/08/2017  . AKI (acute kidney injury) ([place]) 11/08/2017  . Encounter for antineoplastic chemotherapy   . 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 Liver masses   . Mass of upper lobe of right lung 09/28/2017  . Hyponatremia 09/27/2017  . Failure to thrive in adult 09/27/2017  . Metastatic cancer to liver ([place]) 09/27/2017  . DM (diabetes mellitus), type 2 ([place]) 09/27/2017  . Essential hypertension 09/27/2017    Past Surgical History:  Procedure Laterality Date  . HERNIA REPAIR    . JOINT REPLACEMENT Right    knee  . LIVER BIOPSY          Home Medications    Prior to Admission medications   Medication Sig Start Date End Date Taking? Authorizing Provider  CINNAMON PO Take 600 mg by mouth 2 (two) times daily.   Yes [provider]  fish oil-omega-3 fatty acids 1000 MG capsule Take 1 g by mouth 2 (two) times daily.   Yes [provider]  furosemide (LASIX) 20 MG tablet Take 2 tablets (40 mg total) by mouth 2 (two) times daily. 12/02/17  Yes Dessa Phi, DO  loratadine (CLARITIN) 10 MG tablet Take 1 tablet (10 mg total) by mouth daily. 11/13/17  Yes [place] Silos,  Malachy Moan, MD  metFORMIN (GLUCOPHAGE) 1000 MG tablet Take 1,000 mg by mouth 2 (two) times daily with a meal.   Yes [provider]  nicotine (NICODERM CQ - DOSED IN MG/24 HOURS) 21 mg/24hr patch Place 1 patch (21 mg total) onto the skin daily. 11/12/17  Yes Eugenie Filler, MD  omeprazole (PRILOSEC) 20 MG capsule Take 20 mg by mouth daily.   Yes [provider]  Potassium Chloride ER 20 MEQ TBCR Take 20 mEq by mouth 2 (two) times daily. 12/02/17  Yes Dessa Phi, DO  Red Yeast Rice 600 MG TABS Take 1 tablet by mouth 2 (two)  times daily.    Yes [provider]  fluticasone (FLONASE) 50 MCG/ACT nasal spray Place 2 sprays into both nostrils daily as needed for allergies. 11/12/17   Eugenie Filler, MD  insulin aspart (NOVOLOG) 100 UNIT/ML injection CBG < 70: implement hypoglycemia protocol CBG 70 - 120: 0 units CBG 121 - 150: 2 units CBG 151 - 200: 3 units CBG 201 - 250: 5 units CBG 251 - 300: 8 units CBG 301 - 350: 11 units CBG 351 - 400: 15 units CBG > 400: call MD 10/08/17   Lavina Hamman, MD  polyethylene glycol (MIRALAX / Floria Raveling) packet Take 17 g by mouth daily. 10/09/17   Lavina Hamman, MD  traMADol (ULTRAM) 50 MG tablet Take 1 tablet (50 mg total) by mouth every 6 (six) hours as needed for moderate pain. Patient not taking: Reported on 12/09/2017 10/08/17   Lavina Hamman, MD    Family History Family History  Problem Relation Age of Onset  . Diabetes Mother   . Dementia Mother   . Colon cancer Father   . CAD Neg Hx     Social History Social History   Tobacco Use  . Smoking status: Former Smoker    Types: Cigarettes    Last attempt to quit: 09/20/2017    Years since quitting: 0.2  . Smokeless tobacco: Never Used  Substance Use Topics  . Alcohol use: No  . Drug use: No     Allergies   Amoxicillin-pot clavulanate and Nitrofurantoin   Review of Systems Review of Systems  Constitutional: Positive for fatigue. Negative for chills and fever.  HENT: Negative for ear pain and sore throat.   Eyes: Negative for pain and visual disturbance.  Respiratory: Positive for shortness of breath. Negative for cough.   Cardiovascular: Positive for leg swelling. Negative for chest pain and palpitations.  Gastrointestinal: Negative for abdominal pain and vomiting.  Genitourinary: Negative for dysuria and hematuria.  Musculoskeletal: Negative for arthralgias and back pain.  Skin: Negative for color change and rash.  Neurological: Negative for seizures, syncope and headaches.  All other systems  reviewed and are negative.    Physical Exam Updated Vital Signs  ED Triage Vitals  Enc Vitals Group     BP 12/13/17 2011 128/71     Pulse Rate 12/13/17 2011 98     Resp 12/13/17 2011 20     Temp 12/13/17 2011 98.3 F (36.8 C)     Temp Source 12/13/17 2011 Oral     SpO2 12/13/17 2011 100 %     Weight --      Height --      Head Circumference --      Peak Flow --      Pain Score 12/13/17 2013 3     Pain Loc --      Pain  Edu? --      Excl. in [place]? --     Physical Exam  Constitutional: He is oriented to person, place, and time. He appears ill.  HENT:  Head: Normocephalic and atraumatic.  Mouth/Throat: No oropharyngeal exudate.  Eyes: Pupils are equal, round, and reactive to light. Conjunctivae and EOM are normal.  Neck: Normal range of motion. Neck supple.  Cardiovascular: Normal rate, regular rhythm, normal heart sounds and intact distal pulses.  No murmur heard. Pulmonary/Chest: No respiratory distress. He has no wheezes. He has rales.  Abdominal: Soft. Bowel sounds are normal. He exhibits distension. There is no tenderness.  Musculoskeletal: He exhibits edema (3+ pitting edema b/l).  Neurological: He is alert and oriented to person, place, and time.  Skin: Skin is warm and dry. Capillary refill takes less than 2 seconds.  Psychiatric: He has a normal mood and affect.  Nursing note and vitals reviewed.    ED Treatments / Results  Labs (all labs ordered are listed, but only abnormal results are displayed) Labs Reviewed  COMPREHENSIVE METABOLIC PANEL - Abnormal; Notable for the following components:      Result Value   Glucose, Bld 152 (*)    Calcium 8.1 (*)    Total Protein 6.0 (*)    Albumin 2.6 (*)    AST 51 (*)    Total Bilirubin 1.8 (*)    All other components within normal limits  CBC WITH DIFFERENTIAL/PLATELET - Abnormal; Notable for the following components:   WBC 23.8 (*)    RBC 2.54 (*)    Hemoglobin 8.5 (*)    HCT 25.4 (*)    RDW 18.6 (*)     Platelets 82 (*)    Neutro Abs 22.8 (*)    Monocytes Absolute 0.0 (*)    All other components within normal limits  COMPREHENSIVE METABOLIC PANEL - Abnormal; Notable for the following components:   Glucose, Bld 170 (*)    Calcium 8.2 (*)    Total Protein 5.9 (*)    Albumin 2.6 (*)    AST 46 (*)    Total Bilirubin 2.6 (*)    All other components within normal limits  CBC - Abnormal; Notable for the following components:   WBC 29.5 (*)    RBC 2.54 (*)    Hemoglobin 8.6 (*)    HCT 25.6 (*)    MCV 100.8 (*)    RDW 18.8 (*)    Platelets 68 (*)    All other components within normal limits  GLUCOSE, CAPILLARY - Abnormal; Notable for the following components:   Glucose-Capillary 130 (*)    All other components within normal limits  GLUCOSE, CAPILLARY - Abnormal; Notable for the following components:   Glucose-Capillary 150 (*)    All other components within normal limits  GLUCOSE, CAPILLARY - Abnormal; Notable for the following components:   Glucose-Capillary 198 (*)    All other components within normal limits  GLUCOSE, CAPILLARY - Abnormal; Notable for the following components:   Glucose-Capillary 136 (*)    All other components within normal limits  COMPREHENSIVE METABOLIC PANEL - Abnormal; Notable for the following components:   Chloride 97 (*)    Glucose, Bld 187 (*)    Calcium 8.1 (*)    Total Protein 5.7 (*)    Albumin 2.6 (*)    AST 50 (*)    Total Bilirubin 2.1 (*)    All other components within normal limits  CBC - Abnormal; Notable for the following components:  WBC 14.5 (*)    RBC 2.29 (*)    Hemoglobin 7.6 (*)    HCT 23.1 (*)    MCV 100.9 (*)    RDW 18.4 (*)    Platelets 40 (*)    All other components within normal limits  GLUCOSE, CAPILLARY - Abnormal; Notable for the following components:   Glucose-Capillary 185 (*)    All other components within normal limits  GLUCOSE, CAPILLARY - Abnormal; Notable for the following components:   Glucose-Capillary 153  (*)    All other components within normal limits  GLUCOSE, CAPILLARY - Abnormal; Notable for the following components:   Glucose-Capillary 213 (*)    All other components within normal limits  GLUCOSE, CAPILLARY - Abnormal; Notable for the following components:   Glucose-Capillary 137 (*)    All other components within normal limits  BASIC METABOLIC PANEL - Abnormal; Notable for the following components:   Glucose, Bld 166 (*)    Calcium 8.0 (*)    All other components within normal limits  CBC - Abnormal; Notable for the following components:   RBC 2.04 (*)    Hemoglobin 7.0 (*)    HCT 20.6 (*)    MCV 101.0 (*)    MCH 34.3 (*)    RDW 18.0 (*)    Platelets 23 (*)    All other components within normal limits  GLUCOSE, CAPILLARY - Abnormal; Notable for the following components:   Glucose-Capillary 175 (*)    All other components within normal limits  BODY FLUID CELL COUNT WITH DIFFERENTIAL - Abnormal; Notable for the following components:   Appearance, Fluid CLEAR (*)    Neutrophil Count, Fluid 70 (*)    Monocyte-Macrophage-Serous Fluid 21 (*)    All other components within normal limits  GLUCOSE, CAPILLARY - Abnormal; Notable for the following components:   Glucose-Capillary 161 (*)    All other components within normal limits  GLUCOSE, CAPILLARY - Abnormal; Notable for the following components:   Glucose-Capillary 214 (*)    All other components within normal limits  GLUCOSE, CAPILLARY - Abnormal; Notable for the following components:   Glucose-Capillary 166 (*)    All other components within normal limits  COMPREHENSIVE METABOLIC PANEL - Abnormal; Notable for the following components:   Chloride 96 (*)    Glucose, Bld 252 (*)    Calcium 8.4 (*)    Total Protein 6.1 (*)    Albumin 2.7 (*)    AST 46 (*)    Total Bilirubin 3.1 (*)    All other components within normal limits  CBC WITH DIFFERENTIAL/PLATELET - Abnormal; Notable for the following components:   WBC 1.6 (*)      RBC 2.40 (*)    Hemoglobin 7.8 (*)    HCT 23.3 (*)    RDW 18.8 (*)    Platelets 13 (*)    Neutro Abs 1.0 (*)    Lymphs Abs 0.6 (*)    Monocytes Absolute 0.0 (*)    All other components within normal limits  GLUCOSE, CAPILLARY - Abnormal; Notable for the following components:   Glucose-Capillary 146 (*)    All other components within normal limits  GLUCOSE, CAPILLARY - Abnormal; Notable for the following components:   Glucose-Capillary 172 (*)    All other components within normal limits  GRAM STAIN  CULTURE, BODY FLUID-BOTTLE  BRAIN NATRIURETIC PEPTIDE  TROPONIN I  URINALYSIS, ROUTINE W REFLEX MICROSCOPIC  PROTIME-INR  LIPASE, BLOOD  I-STAT TROPONIN, ED  TYPE AND SCREEN  PREPARE RBC (CROSSMATCH)  CYTOLOGY - NON PAP    EKG EKG Interpretation  Date/Time:  Friday December 13 2017 21:14:30 EDT Ventricular Rate:  98 PR Interval:    QRS Duration: 99 QT Interval:  353 QTC Calculation: 451 R Axis:   -27 Text Interpretation:  Sinus rhythm Borderline left axis deviation Low voltage, precordial leads Confirmed by Pattricia Boss [phone]) on 12/14/2017 5:30:29 PM   Radiology US Paracentesis  Result Date: 12/16/2017 INDICATION: Abdominal distention. Newly diagnosed ascites. Request diagnostic and therapeutic paracentesis. EXAM: ULTRASOUND GUIDED LEFT LOWER QUADRANT PARACENTESIS MEDICATIONS: None. COMPLICATIONS: None immediate. PROCEDURE: Informed written consent was obtained from the patient after a discussion of the risks, benefits and alternatives to treatment. A timeout was performed prior to the initiation of the procedure. Initial ultrasound scanning demonstrates a moderate amount of ascites within the left lower abdominal quadrant. The left lower abdomen was prepped and draped in the usual sterile fashion. 1% lidocaine with epinephrine was used for local anesthesia. Following this, a 19 gauge, 10-cm, Yueh catheter was introduced. An ultrasound image was saved for documentation  purposes. The paracentesis was performed. The catheter was removed and a dressing was applied. The patient tolerated the procedure well without immediate post procedural complication. FINDINGS: A total of approximately 2.4 L of clear yellow fluid was removed. Samples were sent to the laboratory as requested by the clinical team. IMPRESSION: Successful ultrasound-guided paracentesis yielding 2.4 liters of peritoneal fluid. Read by: Ascencion Dike PA-C Electronically Signed   By: Jerilynn Mages.  Shick M.D.   On: 12/16/2017 12:16    Procedures Procedures (including critical care time)   Angiocath insertion Performed by: Lennice Sites  Consent: Verbal consent obtained. Risks and benefits: risks, benefits and alternatives were discussed Time out: Immediately prior to procedure a "time out" was called to verify the correct patient, procedure, equipment, support staff and site/side marked as required.  Preparation: Patient was prepped and draped in the usual sterile fashion.  Vein Location: right forearm  Ultrasound Guided  Gauge: 20  Normal blood return and flush without difficulty Patient tolerance: Patient tolerated the procedure well with no immediate complications.    Medications Ordered in ED Medications  nicotine (NICODERM CQ - dosed in mg/24 hours) patch 21 mg (21 mg Transdermal Patch Applied 12/17/17 1057)  pantoprazole (PROTONIX) EC tablet 40 mg (40 mg Oral Given 12/17/17 1058)  polyethylene glycol (MIRALAX / GLYCOLAX) packet 17 g (17 g Oral Given 12/17/17 1057)  potassium chloride SA (K-DUR,KLOR-CON) CR tablet 20 mEq (20 mEq Oral Given 12/17/17 1058)  loratadine (CLARITIN) tablet 10 mg (10 mg Oral Given 12/17/17 1058)  fluticasone (FLONASE) 50 MCG/ACT nasal spray 2 spray (2 sprays Each Nare Given 12/15/17 0008)  feeding supplement (PRO-STAT SUGAR FREE 64) liquid 30 mL (30 mLs Oral Given 12/17/17 1057)  sodium chloride flush (NS) 0.9 % injection 3 mL (3 mLs Intravenous Not Given 12/16/17 2047)  sodium  chloride flush (NS) 0.9 % injection 3 mL (has no administration in time range)  0.9 %  sodium chloride infusion (has no administration in time range)  furosemide (LASIX) injection 40 mg (40 mg Intravenous Given 12/17/17 0853)  insulin aspart (novoLOG) injection 0-9 Units (2 Units Subcutaneous Given 12/17/17 0858)  insulin aspart (novoLOG) injection 0-5 Units (0 Units Subcutaneous Not Given 12/16/17 2231)  loratadine (CLARITIN) tablet 10 mg (10 mg Oral Given 12/15/17 0008)  sodium chloride (OCEAN) 0.65 % nasal spray 1 spray (1 spray Each Nare Given 12/15/17 0115)  traMADol (ULTRAM) tablet 50 mg (50  mg Oral Given 12/17/17 1058)  lidocaine (XYLOCAINE) 1 % (with pres) injection (has no administration in time range)  ciprofloxacin (CIPRO) IVPB 400 mg ( Intravenous Stopped 12/16/17 2147)  hydrocortisone (ANUSOL-HC) suppository 25 mg (has no administration in time range)  furosemide (LASIX) injection 40 mg (40 mg Intravenous Given 12/14/17 0143)  lactulose (CHRONULAC) 10 GM/15ML solution 10 g (10 g Oral Given 12/15/17 1124)  albumin human 25 % solution 25 g (25 g Intravenous New Bag/Given 12/15/17 1803)  0.9 %  sodium chloride infusion (Manually program via Guardrails IV Fluids) ( Intravenous New Bag/Given 12/16/17 1659)     Initial Impression / Assessment and Plan / ED Course  I have reviewed the triage vital signs and the nursing notes.  Pertinent labs & imaging results that were available during my care of the patient were reviewed by me and considered in my medical decision making (see chart for details).     Robert Compton is a 72 year old male with history of heart failure, lung cancer with liver metastasis who presents to the ED with weight gain.  Patient with normal vitals.  No fever.  Patient states that he gained about 14 pounds over the last several days.  Patient recently admitted for volume overload and had his Lasix increased to 40 mg twice a day.  Patient denies any abdominal pain, chest pain.  Patient  has shortness of breath mostly with exertion.  He states that his legs and his stomach feel full.  Patient has increased swelling on both of his legs and over the last 4 days has had weight increase despite increased Lasix.  Patient denies any fever, chills.  Patient appears volume overloaded on exam.  Patient with abdominal distention but no tenderness, pitting edema bilaterally, rales on exam.  Concern for volume overload likely multifactorial given lung and liver disease and heart failure.  EKG shows no acute findings.  Lab work obtained to evaluate for volume overload including BNP, chest x-ray. Patient likely will need admission for diuresis.  Patient with no sign of urinary tract infection.  Chest x-ray shows no signs of pneumonia, pneumothorax and has some mild edema.  Troponin within normal limits.  BNP within normal limits.  Patient with no significant electrolyte abnormality or kidney injury.  Patient with leukocytosis but otherwise unremarkable CBC.  Leukocytosis likely secondary to infusion therapy today.  Patient with no fever.  No source of infection on exam.  Upon chart review patient has had leukocytosis unexplained before in the past but has been contributed to his chemotherapy infusions.    Patient appears to be symptomatic from volume overload likely multifactorial.  Suspect that patient with liver metastasis causing volume issues at this time.  Patient with low albumin.  Had discussion with hospital about admission as he appears to be failing Lasix treatment.  He recommends changing medication over to torsemide and spironolactone and after discussion with patient he would like admission for this medication change.  Therefore, patient admitted for observation and further diuresis.  Final Clinical Impressions(s) / ED Diagnoses   Final diagnoses:  Hypervolemia, unspecified hypervolemia type    ED Discharge Orders    None       Lennice Sites, DO 12/14/17 0002    Lennice Sites,  DO 12/17/17 1105

## 2017-12-13 NOTE — Patient Instructions (Signed)
Pegfilgrastim injection What is this medicine? PEGFILGRASTIM (PEG fil gra stim) is a long-acting granulocyte colony-stimulating factor that stimulates the growth of neutrophils, a type of white blood cell important in the body's fight against infection. It is used to reduce the incidence of fever and infection in patients with certain types of cancer who are receiving chemotherapy that affects the bone marrow, and to increase survival after being exposed to high doses of radiation. This medicine may be used for other purposes; ask your health care provider or pharmacist if you have questions. COMMON BRAND NAME(S): Neulasta What should I tell my health care provider before I take this medicine? They need to know if you have any of these conditions: -kidney disease -latex allergy -ongoing radiation therapy -sickle cell disease -skin reactions to acrylic adhesives (On-Body Injector only) -an unusual or allergic reaction to pegfilgrastim, filgrastim, other medicines, foods, dyes, or preservatives -pregnant or trying to get pregnant -breast-feeding How should I use this medicine? This medicine is for injection under the skin. If you get this medicine at home, you will be taught how to prepare and give the pre-filled syringe or how to use the On-body Injector. Refer to the patient Instructions for Use for detailed instructions. Use exactly as directed. Tell your healthcare provider immediately if you suspect that the On-body Injector may not have performed as intended or if you suspect the use of the On-body Injector resulted in a missed or partial dose. It is important that you put your used needles and syringes in a special sharps container. Do not put them in a trash can. If you do not have a sharps container, call your pharmacist or healthcare provider to get one. Talk to your pediatrician regarding the use of this medicine in children. While this drug may be prescribed for selected conditions,  precautions do apply. Overdosage: If you think you have taken too much of this medicine contact a poison control center or emergency room at once. NOTE: This medicine is only for you. Do not share this medicine with others. What if I miss a dose? It is important not to miss your dose. Call your doctor or health care professional if you miss your dose. If you miss a dose due to an On-body Injector failure or leakage, a new dose should be administered as soon as possible using a single prefilled syringe for manual use. What may interact with this medicine? Interactions have not been studied. Give your health care provider a list of all the medicines, herbs, non-prescription drugs, or dietary supplements you use. Also tell them if you smoke, drink alcohol, or use illegal drugs. Some items may interact with your medicine. This list may not describe all possible interactions. Give your health care provider a list of all the medicines, herbs, non-prescription drugs, or dietary supplements you use. Also tell them if you smoke, drink alcohol, or use illegal drugs. Some items may interact with your medicine. What should I watch for while using this medicine? You may need blood work done while you are taking this medicine. If you are going to need a MRI, CT scan, or other procedure, tell your doctor that you are using this medicine (On-Body Injector only). What side effects may I notice from receiving this medicine? Side effects that you should report to your doctor or health care professional as soon as possible: -allergic reactions like skin rash, itching or hives, swelling of the face, lips, or tongue -dizziness -fever -pain, redness, or irritation at site   where injected -pinpoint red spots on the skin -red or dark-brown urine -shortness of breath or breathing problems -stomach or side pain, or pain at the shoulder -swelling -tiredness -trouble passing urine or change in the amount of urine Side  effects that usually do not require medical attention (report to your doctor or health care professional if they continue or are bothersome): -bone pain -muscle pain This list may not describe all possible side effects. Call your doctor for medical advice about side effects. You may report side effects to FDA at 1-800-FDA-1088. Where should I keep my medicine? Keep out of the reach of children. Store pre-filled syringes in a refrigerator between 2 and 8 degrees C (36 and 46 degrees F). Do not freeze. Keep in carton to protect from light. Throw away this medicine if it is left out of the refrigerator for more than 48 hours. Throw away any unused medicine after the expiration date. NOTE: This sheet is a summary. It may not cover all possible information. If you have questions about this medicine, talk to your doctor, pharmacist, or health care provider.  2018 Elsevier/Gold Standard (2016-03-29 12:58:03)  

## 2017-12-13 NOTE — ED Triage Notes (Signed)
Pt reports he has had over a 10 lbs weight gain this week. He is a cancer pt, rec'd chemo this week and  injection today. No change in breathing. Has been taking Lasix as prescribed.

## 2017-12-14 ENCOUNTER — Observation Stay (HOSPITAL_COMMUNITY): Payer: BLUE CROSS/BLUE SHIELD

## 2017-12-14 DIAGNOSIS — E8770 Fluid overload, unspecified: Secondary | ICD-10-CM | POA: Diagnosis not present

## 2017-12-14 DIAGNOSIS — E43 Unspecified severe protein-calorie malnutrition: Secondary | ICD-10-CM | POA: Diagnosis not present

## 2017-12-14 DIAGNOSIS — R945 Abnormal results of liver function studies: Secondary | ICD-10-CM | POA: Diagnosis not present

## 2017-12-14 LAB — COMPREHENSIVE METABOLIC PANEL
ALK PHOS: 119 U/L (ref 38–126)
ALT: 33 U/L (ref 0–44)
AST: 46 U/L — ABNORMAL HIGH (ref 15–41)
Albumin: 2.6 g/dL — ABNORMAL LOW (ref 3.5–5.0)
Anion gap: 11 (ref 5–15)
BILIRUBIN TOTAL: 2.6 mg/dL — AB (ref 0.3–1.2)
BUN: 22 mg/dL (ref 8–23)
CALCIUM: 8.2 mg/dL — AB (ref 8.9–10.3)
CO2: 30 mmol/L (ref 22–32)
Chloride: 98 mmol/L (ref 98–111)
Creatinine, Ser: 0.83 mg/dL (ref 0.61–1.24)
Glucose, Bld: 170 mg/dL — ABNORMAL HIGH (ref 70–99)
Potassium: 3.7 mmol/L (ref 3.5–5.1)
Sodium: 139 mmol/L (ref 135–145)
Total Protein: 5.9 g/dL — ABNORMAL LOW (ref 6.5–8.1)

## 2017-12-14 LAB — GLUCOSE, CAPILLARY
GLUCOSE-CAPILLARY: 130 mg/dL — AB (ref 70–99)
GLUCOSE-CAPILLARY: 185 mg/dL — AB (ref 70–99)
Glucose-Capillary: 150 mg/dL — ABNORMAL HIGH (ref 70–99)
Glucose-Capillary: 198 mg/dL — ABNORMAL HIGH (ref 70–99)
Glucose-Capillary: 136 mg/dL — ABNORMAL HIGH (ref 70–99)

## 2017-12-14 LAB — CBC
HCT: 25.6 % — ABNORMAL LOW (ref 39.0–52.0)
Hemoglobin: 8.6 g/dL — ABNORMAL LOW (ref 13.0–17.0)
MCH: 33.9 pg (ref 26.0–34.0)
MCHC: 33.6 g/dL (ref 30.0–36.0)
MCV: 100.8 fL — ABNORMAL HIGH (ref 78.0–100.0)
PLATELETS: 68 10*3/uL — AB (ref 150–400)
RBC: 2.54 MIL/uL — AB (ref 4.22–5.81)
RDW: 18.8 % — ABNORMAL HIGH (ref 11.5–15.5)
WBC: 29.5 10*3/uL — AB (ref 4.0–10.5)

## 2017-12-14 MED ORDER — INSULIN ASPART 100 UNIT/ML ~~LOC~~ SOLN
0.0000 [IU] | Freq: Every day | SUBCUTANEOUS | Status: DC
  Administered 2017-12-20: 2 [IU] via SUBCUTANEOUS
  Administered 2017-12-21: 3 [IU] via SUBCUTANEOUS
  Administered 2017-12-22: 2 [IU] via SUBCUTANEOUS

## 2017-12-14 MED ORDER — POLYETHYLENE GLYCOL 3350 17 G PO PACK
17.0000 g | PACK | Freq: Every day | ORAL | Status: DC
  Administered 2017-12-15 – 2017-12-20 (×5): 17 g via ORAL
  Filled 2017-12-14 (×10): qty 1

## 2017-12-14 MED ORDER — PANTOPRAZOLE SODIUM 40 MG PO TBEC
40.0000 mg | DELAYED_RELEASE_TABLET | Freq: Every day | ORAL | Status: DC
  Administered 2017-12-14 – 2017-12-24 (×11): 40 mg via ORAL
  Filled 2017-12-14 (×11): qty 1

## 2017-12-14 MED ORDER — NICOTINE 21 MG/24HR TD PT24
21.0000 mg | MEDICATED_PATCH | Freq: Every day | TRANSDERMAL | Status: DC
  Administered 2017-12-14 – 2017-12-24 (×11): 21 mg via TRANSDERMAL
  Filled 2017-12-14 (×11): qty 1

## 2017-12-14 MED ORDER — SODIUM CHLORIDE 0.9% FLUSH: 3.0000 mL | INTRAVENOUS | Status: DC | PRN

## 2017-12-14 MED ORDER — LORATADINE 10 MG PO TABS
10.0000 mg | ORAL_TABLET | Freq: Every day | ORAL | Status: DC | PRN
  Administered 2017-12-15: 10 mg via ORAL
  Filled 2017-12-14: qty 1

## 2017-12-14 MED ORDER — FLUTICASONE PROPIONATE 50 MCG/ACT NA SUSP
2.0000 | Freq: Every day | NASAL | Status: DC | PRN
  Administered 2017-12-15: 2 via NASAL
  Filled 2017-12-14: qty 16

## 2017-12-14 MED ORDER — SODIUM CHLORIDE 0.9% FLUSH
3.0000 mL | Freq: Two times a day (BID) | INTRAVENOUS | Status: DC
  Administered 2017-12-14 – 2017-12-24 (×17): 3 mL via INTRAVENOUS

## 2017-12-14 MED ORDER — LORATADINE 10 MG PO TABS
10.0000 mg | ORAL_TABLET | Freq: Every day | ORAL | Status: DC
  Administered 2017-12-14 – 2017-12-24 (×11): 10 mg via ORAL
  Filled 2017-12-14 (×11): qty 1

## 2017-12-14 MED ORDER — FUROSEMIDE 10 MG/ML IJ SOLN
40.0000 mg | Freq: Once | INTRAMUSCULAR | Status: AC
  Administered 2017-12-14: 40 mg via INTRAVENOUS
  Filled 2017-12-14: qty 4

## 2017-12-14 MED ORDER — SODIUM CHLORIDE 0.9 % IV SOLN
250.0000 mL | INTRAVENOUS | Status: DC | PRN
  Administered 2017-12-17 – 2017-12-20 (×2): 250 mL via INTRAVENOUS
  Administered 2017-12-22: 22:00:00 via INTRAVENOUS

## 2017-12-14 MED ORDER — SALINE SPRAY 0.65 % NA SOLN
1.0000 | NASAL | Status: DC | PRN
  Administered 2017-12-15: 1 via NASAL
  Filled 2017-12-14: qty 44

## 2017-12-14 MED ORDER — POTASSIUM CHLORIDE CRYS ER 20 MEQ PO TBCR
20.0000 meq | EXTENDED_RELEASE_TABLET | Freq: Two times a day (BID) | ORAL | Status: DC
  Administered 2017-12-14 – 2017-12-24 (×21): 20 meq via ORAL
  Filled 2017-12-14 (×21): qty 1

## 2017-12-14 MED ORDER — INSULIN ASPART 100 UNIT/ML ~~LOC~~ SOLN
0.0000 [IU] | Freq: Three times a day (TID) | SUBCUTANEOUS | Status: DC
  Administered 2017-12-14: 1 [IU] via SUBCUTANEOUS
  Administered 2017-12-14: 2 [IU] via SUBCUTANEOUS
  Administered 2017-12-15: 1 [IU] via SUBCUTANEOUS
  Administered 2017-12-15: 3 [IU] via SUBCUTANEOUS
  Administered 2017-12-16: 2 [IU] via SUBCUTANEOUS
  Administered 2017-12-16: 3 [IU] via SUBCUTANEOUS
  Administered 2017-12-16 – 2017-12-17 (×2): 2 [IU] via SUBCUTANEOUS
  Administered 2017-12-17: 3 [IU] via SUBCUTANEOUS
  Administered 2017-12-17 – 2017-12-18 (×2): 2 [IU] via SUBCUTANEOUS
  Administered 2017-12-18: 5 [IU] via SUBCUTANEOUS
  Administered 2017-12-18 – 2017-12-19 (×2): 2 [IU] via SUBCUTANEOUS
  Administered 2017-12-19: 3 [IU] via SUBCUTANEOUS
  Administered 2017-12-19 – 2017-12-20 (×4): 2 [IU] via SUBCUTANEOUS
  Administered 2017-12-21: 7 [IU] via SUBCUTANEOUS
  Administered 2017-12-21: 3 [IU] via SUBCUTANEOUS
  Administered 2017-12-21 – 2017-12-22 (×2): 2 [IU] via SUBCUTANEOUS
  Administered 2017-12-22: 3 [IU] via SUBCUTANEOUS
  Administered 2017-12-22 – 2017-12-23 (×3): 2 [IU] via SUBCUTANEOUS
  Administered 2017-12-23: 3 [IU] via SUBCUTANEOUS
  Administered 2017-12-24 (×2): 2 [IU] via SUBCUTANEOUS

## 2017-12-14 MED ORDER — PRO-STAT SUGAR FREE PO LIQD
30.0000 mL | Freq: Two times a day (BID) | ORAL | Status: DC
  Administered 2017-12-14 – 2017-12-20 (×7): 30 mL via ORAL
  Filled 2017-12-14 (×16): qty 30

## 2017-12-14 MED ORDER — FUROSEMIDE 10 MG/ML IJ SOLN
40.0000 mg | Freq: Two times a day (BID) | INTRAMUSCULAR | Status: AC
  Administered 2017-12-14 – 2017-12-17 (×8): 40 mg via INTRAVENOUS
  Filled 2017-12-14 (×8): qty 4

## 2017-12-14 MED ORDER — ENOXAPARIN SODIUM 60 MG/0.6ML ~~LOC~~ SOLN
60.0000 mg | Freq: Every day | SUBCUTANEOUS | Status: DC
  Administered 2017-12-14 (×2): 60 mg via SUBCUTANEOUS
  Filled 2017-12-14 (×2): qty 0.6

## 2017-12-14 NOTE — Progress Notes (Signed)
1655 time correct

## 2017-12-14 NOTE — ED Notes (Signed)
ED TO INPATIENT HANDOFF REPORT  Name/Age/Gender Robert Compton 72 y.o. male  Code Status    Code Status Orders  (From admission, onward)         Start     Ordered   12/14/17 0005  Full code  Continuous     12/14/17 0005        Code Status History    Date Active Date Inactive Code Status Order ID Comments User Context   11/26/2017 2055 12/02/2017 1558 Full Code [id number]  [name], MD Inpatient   11/08/2017 2005 11/12/2017 1833 Full Code [id number]  [name], DO ED   09/27/2017 2346 10/08/2017 2258 Full Code [id number]  [name], MD Inpatient      Home/SNF/Other Home  Chief Complaint Oncology Patient/Retaining fluid in abd.    Level of Care/Admitting Diagnosis ED Disposition    ED Disposition Condition Comment   Admit  Hospital Area: [facility] [[id number]]  Level of Care: Telemetry [5]  Admit to tele based on following criteria: Monitor QTC interval  Diagnosis: Edema [782.3.ICD-9-CM]  Admitting Physician: [name] [[id number]]  Attending Physician: [name] [[id number]]  PT Class (Do Not Modify): Observation [104]  PT Acc Code (Do Not Modify): Observation [10022]       Medical History Past Medical History:  Diagnosis Date  . Arthritis   . Cancer ([place])   . Diabetes mellitus without complication ([place])   . Hypertension     Allergies Allergies  Allergen Reactions  . Amoxicillin-Pot Clavulanate Anaphylaxis    Has patient had a PCN reaction causing immediate rash, facial/tongue/throat swelling, SOB or lightheadedness with hypotension: Yes Has patient had a PCN reaction causing severe rash involving mucus membranes or skin necrosis: Yes Has patient had a PCN reaction that required hospitalization: Yes Has patient had a PCN reaction occurring within the last 10 years: Yes If all of the above answers are "NO", then may proceed with Cephalosporin use.   . Nitrofurantoin Anaphylaxis    IV Location/Drains/Wounds Patient  Lines/Drains/Airways Status   Active Line/Drains/Airways    Name:   Placement date:   Placement time:   Site:   Days:   Peripheral IV 12/13/17 Left;Anterior Forearm   12/13/17    2147    Forearm   1   Peripheral IV 12/13/17 Right Forearm   12/13/17    2219    Forearm   1   Incision (Closed) 09/30/17 Abdomen Right   09/30/17    2043     75   Pressure Injury 11/08/17 Stage II -  Partial thickness loss of dermis presenting as a shallow open ulcer with a red, pink wound bed without slough. two small .5 cm healing stage 2 on each side inner buttock   11/08/17    2200     36   Wound / Incision (Open or Dehisced) 10/08/17 Other (Comment) Buttocks Medial split in skin inn the crease of the buttocks   10/08/17    1010    Buttocks   67          Labs/Imaging Results for orders placed or performed during the hospital encounter of 12/13/17 (from the past 48 hour(s))  Comprehensive metabolic panel     Status: Abnormal   Collection Time: 12/13/17 10:11 PM  Result Value Ref Range   Sodium 137 135 - 145 mmol/L   Potassium 4.0 3.5 - 5.1 mmol/L   Chloride 98 98 - 111 mmol/L   CO2 26 22 - 32 mmol/L  Glucose, Bld 152 (H) 70 - 99 mg/dL   BUN 21 8 - 23 mg/dL   Creatinine, Ser 0.85 0.61 - 1.24 mg/dL   Calcium 8.1 (L) 8.9 - 10.3 mg/dL   Total Protein 6.0 (L) 6.5 - 8.1 g/dL   Albumin 2.6 (L) 3.5 - 5.0 g/dL   AST 51 (H) 15 - 41 U/L   ALT 33 0 - 44 U/L   Alkaline Phosphatase 120 38 - 126 U/L   Total Bilirubin 1.8 (H) 0.3 - 1.2 mg/dL   GFR calc non Af Amer >60 >60 mL/min   GFR calc Af Amer >60 >60 mL/min    Comment: (NOTE) The eGFR has been calculated using the CKD EPI equation. This calculation has not been validated in all clinical situations. eGFR's persistently <60 mL/min signify possible Chronic Kidney Disease.    Anion gap 13 5 - 15    Comment: Performed at [facility], [place] [address] [place], [place] [address]  CBC WITH DIFFERENTIAL     Status: Abnormal   Collection Time:  12/13/17 10:11 PM  Result Value Ref Range   WBC 23.8 (H) 4.0 - 10.5 K/uL   RBC 2.54 (L) 4.22 - 5.81 MIL/uL   Hemoglobin 8.5 (L) 13.0 - 17.0 g/dL   HCT 25.4 (L) 39.0 - 52.0 %   MCV 100.0 78.0 - 100.0 fL   MCH 33.5 26.0 - 34.0 pg   MCHC 33.5 30.0 - 36.0 g/dL   RDW 18.6 (H) 11.5 - 15.5 %   Platelets 82 (L) 150 - 400 K/uL    Comment: SPECIMEN CHECKED FOR CLOTS REPEATED TO VERIFY PLATELET COUNT CONFIRMED BY SMEAR    Neutrophils Relative % 96 %   Neutro Abs 22.8 (H) 1.7 - 7.7 K/uL   Lymphocytes Relative 4 %   Lymphs Abs 0.9 0.7 - 4.0 K/uL   Monocytes Relative 0 %   Monocytes Absolute 0.0 (L) 0.1 - 1.0 K/uL   Eosinophils Relative 0 %   Eosinophils Absolute 0.0 0.0 - 0.7 K/uL   Basophils Relative 0 %   Basophils Absolute 0.0 0.0 - 0.1 K/uL    Comment: Performed at [facility], [place] [address], [place], [place] [address]  Brain natriuretic peptide     Status: None   Collection Time: 12/13/17 10:11 PM  Result Value Ref Range   B Natriuretic Peptide 63.1 0.0 - 100.0 pg/mL    Comment: Performed at [facility], [place] [address] [place], [place] [address]  Troponin I     Status: None   Collection Time: 12/13/17 10:11 PM  Result Value Ref Range   Troponin I <0.03 <0.03 ng/mL    Comment: Performed at [facility], [place] [address], [place], [place] [address]  Protime-INR     Status: None   Collection Time: 12/13/17 10:11 PM  Result Value Ref Range   Prothrombin Time 13.8 11.4 - 15.2 seconds   INR 1.07     Comment: Performed at [facility], [place] [address] [place], [place] [address]  Lipase, blood     Status: None   Collection Time: 12/13/17 10:11 PM  Result Value Ref Range   Lipase 50 11 - 51 U/L    Comment: Performed at [facility], [place] [address], [place], [place] [address]  I-stat troponin, ED     Status: None   Collection Time: 12/13/17 10:19 PM  Result Value Ref Range   Troponin i,  poc  0.01 0.00 - 0.08 ng/mL   Comment 3            Comment: Due to the release kinetics of cTnI, a negative result within the first hours of the onset of symptoms does not rule out myocardial infarction with certainty. If myocardial infarction is still suspected, repeat the test at appropriate intervals.   Urinalysis, Routine w reflex microscopic     Status: None   Collection Time: 12/13/17 10:22 PM  Result Value Ref Range   Color, Urine YELLOW YELLOW   APPearance CLEAR CLEAR   Specific Gravity, Urine 1.008 1.005 - 1.030   pH 5.0 5.0 - 8.0   Glucose, UA NEGATIVE NEGATIVE mg/dL   Hgb urine dipstick NEGATIVE NEGATIVE   Bilirubin Urine NEGATIVE NEGATIVE   Ketones, ur NEGATIVE NEGATIVE mg/dL   Protein, ur NEGATIVE NEGATIVE mg/dL   Nitrite NEGATIVE NEGATIVE   Leukocytes, UA NEGATIVE NEGATIVE    Comment: Performed at [place] [address] [place], [place] [address]   Dg Chest Port 1 View  Result Date: 12/13/2017 CLINICAL DATA:  Patient with 10 pound weight gain. Shortness of breath. EXAM: PORTABLE CHEST 1 VIEW COMPARISON:  Chest radiograph 12/03/2017 FINDINGS: Stable cardiac and mediastinal contours. Bilateral interstitial pulmonary opacities. Low lung volumes. No definite pleural effusion or pneumothorax. IMPRESSION: Interstitial opacities may represent mild edema. Electronically Signed   By: Lovey Newcomer M.D.   On: 12/13/2017 20:50    Pending Labs Unresulted Labs (From admission, onward)    Start     Ordered   12/21/17 0500  Creatinine, serum  (enoxaparin (LOVENOX)    CrCl >/= 30 ml/min)  Weekly,   R    Comments:  while on enoxaparin therapy    12/14/17 0005   12/14/17 0500  Comprehensive metabolic panel  Tomorrow morning,   R     12/14/17 0005   12/14/17 0500  CBC  Tomorrow morning,   R     12/14/17 0005          Vitals/Pain Today's Vitals   12/13/17 2011 12/13/17 2013 12/13/17 2128 12/13/17 2130  BP: 128/71   115/68  Pulse: 98   97  Resp: 20    16  Temp: 98.3 F (36.8 C)   98.7 F (37.1 C)  TempSrc: Oral   Oral  SpO2: 100%   100%  PainSc:  _0 Isolation Precautions No active isolations  Medications Medications  enoxaparin (LOVENOX) injection 40 mg (has no administration in time range)  feeding supplement (PRO-STAT SUGAR FREE 64) liquid 30 mL (has no administration in time range)  sodium chloride flush (NS) 0.9 % injection 3 mL (has no administration in time range)  sodium chloride flush (NS) 0.9 % injection 3 mL (has no administration in time range)  0.9 %  sodium chloride infusion (has no administration in time range)  furosemide (LASIX) injection 40 mg (has no administration in time range)  insulin aspart (novoLOG) injection 0-9 Units (has no administration in time range)  insulin aspart (novoLOG) injection 0-5 Units (has no administration in time range)  furosemide (LASIX) injection 40 mg (has no administration in time range)    Mobility walks with person assist

## 2017-12-14 NOTE — Progress Notes (Signed)
Lovenox per Pharmacy for DVT Prophylaxis    Pharmacy has been consulted from dosing enoxaparin (lovenox) in this patient for DVT prophylaxis.  The pharmacist has reviewed pertinent labs (Hgb _8.5__; PLT__82_), patient weight (_128__kg) and renal function (CrCl__>90_mL/min) and decided that enoxaparin __mg SQ Q24Hrs is appropriate for this patient.  The pharmacy department will sign off at this time.  Please reconsult pharmacy if status changes or for further issues.  Thank you  Cyndia Diver PharmD, BCPS  12/14/2017, 1:11 AM

## 2017-12-14 NOTE — Progress Notes (Signed)
Time error: actually 1325

## 2017-12-14 NOTE — Progress Notes (Addendum)
Report given by Rica Mote, RN. Awaiting pt arrival.

## 2017-12-14 NOTE — Progress Notes (Signed)
PROGRESS NOTE    Robert Compton  TKZ:[id number] DOB: August 09, 1945 DOA: 12/13/2017 PCP: [name], MD    Brief Narrative:  72 y.o. male, w metastatic small cell lung cancer to liver and cervical spine , severe protein calorie malnutrition, h/o pancytopenia. Presents with 10 lbs of weight gain and increase in lower ext edema. Patient was admitted for concerns of heart failure  Assessment & Plan:   Principal Problem:   Edema Active Problems:   DM (diabetes mellitus), type 2 (HCC)   Anemia   Severe protein-calorie malnutrition (HCC)   Abnormal liver function   Leukocytosis  Edema -Recent 2d echo reviewed with findings of normal LVEF -Currently on 40mg  IV BID lasix with improvement noted thus far -Wt on admission: 135.6kg -Dry wt 128kg -Continue to monitor I/o and daily weights  Severe protein calorie malnutrition -Will continue with prostat 30 mL po bid  Abnormal liver function -likely secondary to metastatic disease -LFT's somewhat improved today -Repeat CMP in AM -Abd [place] reviewed. Small to mod ascites noted  Leukocytosis probably due to neulasta -Stable at present -Will repeat CBC in AM  Anemia/ h/o pancytopenia -Hgb remains stable -Patient hemodynamically stable  Dm2 -metformin currently on hold -Will continue on SSI coverage as tolerated -Glucose trends reviewed. Stable thus far  Gerd -Cont PPI -Stable at present  Hyperlipidemia -Had stopped Red Yeast Rice due to abnormal liver function at presentation -Repeat LFT's in AM per above  DVT prophylaxis: Lovenox subQ Code Status: Full Family Communication: Pt in room, family not at bedside Disposition Plan: Uncertain at this time  Consultants:     Procedures:     Antimicrobials: Anti-infectives (From admission, onward)   None       Subjective: Reports feeling somewhat better today  Objective: Vitals:   12/13/17 2130 12/14/17 0107 12/14/17 0117 12/14/17 0425  BP: 115/68 133/78   119/66  Pulse: 97 (!) 102  (!) 114  Resp: 16 15 20 18   Temp: 98.7 F (37.1 C) 98.9 F (37.2 C)  98.8 F (37.1 C)  TempSrc: Oral Oral Oral Oral  SpO2: 100% 97%  95%  Weight:   135.7 kg   Height:   6\' 3"  (1.905 m)     Intake/Output Summary (Last 24 hours) at 12/14/2017 1328 Last data filed at 12/14/2017 1300 Gross per 24 hour  Intake 363 ml  Output 3375 ml  Net -3012 ml   Filed Weights   12/14/17 0117  Weight: 135.7 kg    Examination:  General exam: Appears calm and comfortable  Respiratory system: Clear to auscultation. Respiratory effort normal. Cardiovascular system: S1 & S2 heard, RRR Gastrointestinal system: obese, distended, pos BS Central nervous system: Alert and oriented. No focal neurological deficits. Extremities: Symmetric 5 x 5 power, 2-3+ B LE edema Skin: No rashes, lesions Psychiatry: Judgement and insight appear normal. Mood & affect appropriate.   Data Reviewed: I have personally reviewed following labs and imaging studies  CBC: Recent Labs  Lab 12/09/17 1002 12/13/17 2211 12/14/17 0354  WBC 9.7 23.8* 29.5*  NEUTROABS 7.5* 22.8*  --   HGB 9.2* 8.5* 8.6*  HCT 27.1* 25.4* 25.6*  MCV 98.7* 100.0 100.8*  PLT 129* 82* 68*   Basic Metabolic Panel: Recent Labs  Lab 12/09/17 1002 12/13/17 2211 12/14/17 0354  NA 138 137 139  K 3.5 4.0 3.7  CL 98 98 98  CO2 29 26 30   GLUCOSE 155* 152* 170*  BUN 13 21 22   CREATININE 1.07 0.85 0.83  CALCIUM 8.1*  8.1* 8.2*   GFR: Estimated Creatinine Clearance: 123 mL/min (by C-G formula based on SCr of 0.83 mg/dL). Liver Function Tests: Recent Labs  Lab 12/09/17 1002 12/13/17 2211 12/14/17 0354  AST 44* 51* 46*  ALT 30 33 33  ALKPHOS 172* 120 119  BILITOT 2.4* 1.8* 2.6*  PROT 6.2* 6.0* 5.9*  ALBUMIN 2.5* 2.6* 2.6*   Recent Labs  Lab 12/13/17 2211  LIPASE 50   No results for input(s): AMMONIA in the last 168 hours. Coagulation Profile: Recent Labs  Lab 12/13/17 2211  INR 1.07   Cardiac  Enzymes: Recent Labs  Lab 12/13/17 2211  TROPONINI <0.03   BNP (last 3 results) No results for input(s): PROBNP in the last 8760 hours. HbA1C: No results for input(s): HGBA1C in the last 72 hours. CBG: Recent Labs  Lab 12/14/17 0124 12/14/17 0816 12/14/17 1221  GLUCAP 130* 150* 198*   Lipid Profile: No results for input(s): CHOL, HDL, LDLCALC, TRIG, CHOLHDL, LDLDIRECT in the last 72 hours. Thyroid Function Tests: No results for input(s): TSH, T4TOTAL, FREET4, T3FREE, THYROIDAB in the last 72 hours. Anemia Panel: No results for input(s): VITAMINB12, FOLATE, FERRITIN, TIBC, IRON, RETICCTPCT in the last 72 hours. Sepsis Labs: No results for input(s): PROCALCITON, LATICACIDVEN in the last 168 hours.  No results found for this or any previous visit (from the past 240 hour(s)).   Radiology Studies: US Abdomen Complete  Result Date: 12/14/2017 CLINICAL DATA:  72 year old male with abdominal distention. EXAM: ABDOMEN ULTRASOUND COMPLETE COMPARISON:  CT of the abdomen pelvis dated 11/08/2017 FINDINGS: Evaluation is limited due to patient's body habitus, portable technique, and inability of the patient to cooperate with the exam. Gallbladder: No gallstones or wall thickening visualized. No sonographic Murphy sign noted by sonographer. Common bile duct: Diameter: 4 mm Liver: The liver is heterogeneous and echogenic, likely related to fatty infiltration. There is irregularity of the hepatic contour which may represent cirrhosis or pseudo cirrhosis. The known metastatic lesions are poorly visualized on this ultrasound. Portal vein is patent on color Doppler imaging with normal direction of blood flow towards the liver. IVC: No abnormality visualized. Pancreas: Not visualized and obscured by bowel gas. Spleen: Size and appearance within normal limits. Right Kidney: Length: 11 cm. Normal echogenicity. No hydronephrosis or shadowing stone. Left Kidney: Length: 12 cm. Suboptimally visualized. No  hydronephrosis or large shadowing stone. Abdominal aorta: Not well visualized. Other findings: There is small to moderate ascites. IMPRESSION: 1. Heterogeneous liver with findings of cirrhosis or pseudo cirrhosis. Known hepatic metastatic disease are suboptimally seen on this ultrasound. 2. Ascites. Electronically Signed   By: Anner Crete M.D.   On: 12/14/2017 03:48   Dg Chest Port 1 View  Result Date: 12/13/2017 CLINICAL DATA:  Patient with 10 pound weight gain. Shortness of breath. EXAM: PORTABLE CHEST 1 VIEW COMPARISON:  Chest radiograph 12/03/2017 FINDINGS: Stable cardiac and mediastinal contours. Bilateral interstitial pulmonary opacities. Low lung volumes. No definite pleural effusion or pneumothorax. IMPRESSION: Interstitial opacities may represent mild edema. Electronically Signed   By: Lovey Newcomer M.D.   On: 12/13/2017 20:50    Scheduled Meds: . enoxaparin (LOVENOX) injection  60 mg Subcutaneous QHS  . feeding supplement (PRO-STAT SUGAR FREE 64)  30 mL Oral BID  . furosemide  40 mg Intravenous Q12H  . insulin aspart  0-5 Units Subcutaneous QHS  . insulin aspart  0-9 Units Subcutaneous TID WC  . loratadine  10 mg Oral Daily  . nicotine  21 mg Transdermal Daily  .  pantoprazole  40 mg Oral Daily  . polyethylene glycol  17 g Oral Daily  . potassium chloride SA  20 mEq Oral BID  . sodium chloride flush  3 mL Intravenous Q12H   Continuous Infusions: . sodium chloride       LOS: 0 days   Marylu Lund, MD [facility] Pager On Amion  If 7PM-7AM, please contact night-coverage 12/14/2017, 1:28 PM

## 2017-12-14 NOTE — Progress Notes (Signed)
Pt admitted to 1429. Pt oriented to room, staff, rounding, call bell, fall precautions and phone. See doc flow sheet for VS

## 2017-12-15 DIAGNOSIS — M1990 Unspecified osteoarthritis, unspecified site: Secondary | ICD-10-CM | POA: Diagnosis present

## 2017-12-15 DIAGNOSIS — E119 Type 2 diabetes mellitus without complications: Secondary | ICD-10-CM | POA: Diagnosis present

## 2017-12-15 DIAGNOSIS — Z87891 Personal history of nicotine dependence: Secondary | ICD-10-CM | POA: Diagnosis not present

## 2017-12-15 DIAGNOSIS — Z833 Family history of diabetes mellitus: Secondary | ICD-10-CM | POA: Diagnosis not present

## 2017-12-15 DIAGNOSIS — R945 Abnormal results of liver function studies: Secondary | ICD-10-CM | POA: Diagnosis not present

## 2017-12-15 DIAGNOSIS — Z96651 Presence of right artificial knee joint: Secondary | ICD-10-CM | POA: Diagnosis present

## 2017-12-15 DIAGNOSIS — R5081 Fever presenting with conditions classified elsewhere: Secondary | ICD-10-CM | POA: Diagnosis present

## 2017-12-15 DIAGNOSIS — E43 Unspecified severe protein-calorie malnutrition: Secondary | ICD-10-CM | POA: Diagnosis present

## 2017-12-15 DIAGNOSIS — Z6833 Body mass index (BMI) 33.0-33.9, adult: Secondary | ICD-10-CM | POA: Diagnosis not present

## 2017-12-15 DIAGNOSIS — R188 Other ascites: Secondary | ICD-10-CM | POA: Diagnosis present

## 2017-12-15 DIAGNOSIS — Z7951 Long term (current) use of inhaled steroids: Secondary | ICD-10-CM | POA: Diagnosis not present

## 2017-12-15 DIAGNOSIS — Z794 Long term (current) use of insulin: Secondary | ICD-10-CM | POA: Diagnosis not present

## 2017-12-15 DIAGNOSIS — E785 Hyperlipidemia, unspecified: Secondary | ICD-10-CM | POA: Diagnosis present

## 2017-12-15 DIAGNOSIS — C787 Secondary malignant neoplasm of liver and intrahepatic bile duct: Secondary | ICD-10-CM | POA: Diagnosis present

## 2017-12-15 DIAGNOSIS — K652 Spontaneous bacterial peritonitis: Secondary | ICD-10-CM | POA: Diagnosis not present

## 2017-12-15 DIAGNOSIS — C7951 Secondary malignant neoplasm of bone: Secondary | ICD-10-CM | POA: Diagnosis present

## 2017-12-15 DIAGNOSIS — Z800 Family history of malignant neoplasm of digestive organs: Secondary | ICD-10-CM | POA: Diagnosis not present

## 2017-12-15 DIAGNOSIS — D61818 Other pancytopenia: Secondary | ICD-10-CM | POA: Diagnosis not present

## 2017-12-15 DIAGNOSIS — T458X5A Adverse effect of other primarily systemic and hematological agents, initial encounter: Secondary | ICD-10-CM | POA: Diagnosis present

## 2017-12-15 DIAGNOSIS — K219 Gastro-esophageal reflux disease without esophagitis: Secondary | ICD-10-CM | POA: Diagnosis present

## 2017-12-15 DIAGNOSIS — C3411 Malignant neoplasm of upper lobe, right bronchus or lung: Secondary | ICD-10-CM | POA: Diagnosis present

## 2017-12-15 DIAGNOSIS — Z881 Allergy status to other antibiotic agents status: Secondary | ICD-10-CM | POA: Diagnosis not present

## 2017-12-15 DIAGNOSIS — I471 Supraventricular tachycardia: Secondary | ICD-10-CM | POA: Diagnosis not present

## 2017-12-15 DIAGNOSIS — E8770 Fluid overload, unspecified: Secondary | ICD-10-CM | POA: Diagnosis present

## 2017-12-15 DIAGNOSIS — R140 Abdominal distension (gaseous): Secondary | ICD-10-CM | POA: Diagnosis present

## 2017-12-15 DIAGNOSIS — Z888 Allergy status to other drugs, medicaments and biological substances status: Secondary | ICD-10-CM | POA: Diagnosis not present

## 2017-12-15 DIAGNOSIS — D61810 Antineoplastic chemotherapy induced pancytopenia: Secondary | ICD-10-CM | POA: Diagnosis present

## 2017-12-15 DIAGNOSIS — Z87892 Personal history of anaphylaxis: Secondary | ICD-10-CM | POA: Diagnosis not present

## 2017-12-15 LAB — COMPREHENSIVE METABOLIC PANEL
ALK PHOS: 124 U/L (ref 38–126)
ALT: 33 U/L (ref 0–44)
ANION GAP: 12 (ref 5–15)
AST: 50 U/L — ABNORMAL HIGH (ref 15–41)
Albumin: 2.6 g/dL — ABNORMAL LOW (ref 3.5–5.0)
BILIRUBIN TOTAL: 2.1 mg/dL — AB (ref 0.3–1.2)
BUN: 20 mg/dL (ref 8–23)
CALCIUM: 8.1 mg/dL — AB (ref 8.9–10.3)
CO2: 30 mmol/L (ref 22–32)
Chloride: 97 mmol/L — ABNORMAL LOW (ref 98–111)
Creatinine, Ser: 0.76 mg/dL (ref 0.61–1.24)
GFR calc non Af Amer: >60 mL/min (ref >60)
Glucose, Bld: 187 mg/dL — ABNORMAL HIGH (ref 70–99)
Potassium: 3.8 mmol/L (ref 3.5–5.1)
Sodium: 139 mmol/L (ref 135–145)
TOTAL PROTEIN: 5.7 g/dL — AB (ref 6.5–8.1)

## 2017-12-15 LAB — GLUCOSE, CAPILLARY
GLUCOSE-CAPILLARY: 153 mg/dL — AB (ref 70–99)
GLUCOSE-CAPILLARY: 213 mg/dL — AB (ref 70–99)
GLUCOSE-CAPILLARY: 175 mg/dL — AB (ref 70–99)
Glucose-Capillary: 137 mg/dL — ABNORMAL HIGH (ref 70–99)

## 2017-12-15 LAB — CBC
HCT: 23.1 % — ABNORMAL LOW (ref 39.0–52.0)
HEMOGLOBIN: 7.6 g/dL — AB (ref 13.0–17.0)
MCH: 33.2 pg (ref 26.0–34.0)
MCHC: 32.9 g/dL (ref 30.0–36.0)
MCV: 100.9 fL — ABNORMAL HIGH (ref 78.0–100.0)
Platelets: 40 10*3/uL — ABNORMAL LOW (ref 150–400)
RBC: 2.29 MIL/uL — AB (ref 4.22–5.81)
RDW: 18.4 % — ABNORMAL HIGH (ref 11.5–15.5)
WBC: 14.5 10*3/uL — ABNORMAL HIGH (ref 4.0–10.5)

## 2017-12-15 MED ORDER — LACTULOSE 10 GM/15ML PO SOLN
10.0000 g | ORAL | Status: AC
  Administered 2017-12-15: 10 g via ORAL
  Filled 2017-12-15: qty 30

## 2017-12-15 MED ORDER — ALBUMIN HUMAN 25 % IV SOLN
25.0000 g | Freq: Once | INTRAVENOUS | Status: AC
  Administered 2017-12-15: 25 g via INTRAVENOUS
  Filled 2017-12-15: qty 100

## 2017-12-15 NOTE — Evaluation (Signed)
Physical Therapy Evaluation Patient Details Name: Robert Compton MRN: [id number] DOB: [birth year] Today's Date: 12/15/2017   History of Present Illness  72 y.o. male with medical history significant of hypertension diabetes type 2, right TKR and active on chemo for metastatic cancer with liver mets admitted for fluid retention in abdomen  Clinical Impression  Pt admitted as above and with functional mobility limitations 2* generalized weakness, limited endurance and ambulatory balance deficits.  Pt plans dc to home with family assist and reports is currently receiving HHPT.    Follow Up Recommendations Home health PT    Equipment Recommendations  None recommended by PT    Recommendations for Other Services       Precautions / Restrictions Precautions Precautions: Fall Restrictions Weight Bearing Restrictions: No      Mobility  Bed Mobility Overal bed mobility: Needs Assistance Bed Mobility: Sit to Supine       Sit to supine: Min guard   General bed mobility comments: Difficulty with LEs into bed but did unassisted  Transfers Overall transfer level: Needs assistance Equipment used: Rolling walker (2 wheeled) Transfers: Sit to/from Stand Sit to Stand: Min guard         General transfer comment: steady assist from elevated surface  Ambulation/Gait Ambulation/Gait assistance: Min guard Gait Distance (Feet): 180 Feet Assistive device: Rolling walker (2 wheeled) Gait Pattern/deviations: Step-through pattern;Decreased step length - right;Decreased step length - left;Shuffle;Wide base of support Gait velocity: mod pace   General Gait Details: min cues for position from [employer]            Wheelchair Mobility    Modified Rankin (Stroke Patients Only)       Balance Overall balance assessment: Needs assistance Sitting-balance support: No upper extremity supported;Feet supported Sitting balance-Leahy Scale: Good     Standing balance support: No upper  extremity supported Standing balance-Leahy Scale: Fair                               Pertinent Vitals/Pain      Home Living Family/patient expects to be discharged to:: Private residence Living Arrangements: Children Available Help at Discharge: Family;Available PRN/intermittently Type of Home: House Home Access: Stairs to enter Entrance Stairs-Rails: None Entrance Stairs-Number of Steps: 2 Home Layout: One level Home Equipment: Walker - 2 wheels;Tub bench;Hospital bed Additional Comments: Pt sleeps in recliner at home    Prior Function Level of [place]: Needs assistance   Gait / Transfers Assistance Needed: household ambulator with RW  ADL's / Homemaking Assistance Needed: reports son helps pt with some ADLs and IADL tasks at baseline  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, newspaper   Dominant Hand: Right    Extremity/Trunk Assessment   Upper Extremity Assessment Upper Extremity Assessment: Generalized weakness    Lower Extremity Assessment Lower Extremity Assessment: Generalized weakness    Cervical / Trunk Assessment Cervical / Trunk Assessment: Normal  Communication   Communication: No difficulties  Cognition Arousal/Alertness: Awake/alert Behavior During Therapy: WFL for tasks assessed/performed Overall Cognitive Status: Within Functional Limits for tasks assessed                                        General Comments      Exercises     Assessment/Plan    PT Assessment Patient needs continued PT services  PT Problem List Decreased strength;Decreased activity tolerance;Decreased mobility;Decreased balance;Decreased knowledge of use of DME;Obesity;Pain       PT Treatment Interventions DME instruction;Therapeutic exercise;Gait training;Functional mobility training;Therapeutic activities;Patient/family education    PT Goals (Current goals can be found in the Care Plan section)  Acute Rehab PT  Goals Patient Stated Goal: to go home as soon as possible PT Goal Formulation: With patient Time For Goal Achievement: 12/12/17 Potential to Achieve Goals: Fair    Frequency Min 2X/week   Barriers to discharge        Co-evaluation               AM-PAC PT "6 Clicks" Daily Activity  Outcome Measure Difficulty turning over in bed (including adjusting bedclothes, sheets and blankets)?: A Little Difficulty moving from lying on back to sitting on the side of the bed? : A Little Difficulty sitting down on and standing up from a chair with arms (e.g., wheelchair, bedside commode, etc,.)?: A Little Help needed moving to and from a bed to chair (including a wheelchair)?: A Little Help needed walking in hospital room?: A Little Help needed climbing 3-5 steps with a railing? : A Little 6 Click Score: 18    End of Session   Activity Tolerance: Patient tolerated treatment well Patient left: with call bell/phone within reach;in bed;with bed alarm set Nurse Communication: Mobility status PT Visit Diagnosis: Other abnormalities of gait and mobility (R26.89)    Time: 1431-1453 PT Time Calculation (min) (ACUTE ONLY): 22 min   Charges:   PT Evaluation $PT Eval Low Complexity: 1 Low          Pg [phone]   Oluwadarasimi Favor 12/15/2017, 3:27 PM

## 2017-12-15 NOTE — Progress Notes (Signed)
PROGRESS NOTE    Robert Compton  ZOX:[id number] DOB: December 16, 1945 DOA: 12/13/2017 PCP: [name], MD    Brief Narrative:  72 y.o. male, w metastatic small cell lung cancer to liver and cervical spine , severe protein calorie malnutrition, h/o pancytopenia. Presents with 10 lbs of weight gain and increase in lower ext edema. Patient was admitted for concerns of heart failure  Assessment & Plan:   Principal Problem:   Edema Active Problems:   DM (diabetes mellitus), type 2 (HCC)   Anemia   Severe protein-calorie malnutrition (HCC)   Abnormal liver function   Leukocytosis  Edema -Recent 2d echo reviewed with findings of normal LVEF -Presently remains on 40mg  IV BID lasix with improvement noted thus far -Wt on admission: 135.6kg -Dry wt 128kg -Current weight: 130kg -Patient with symptomatic ascites. Will continue BID lasix IV per above -Have requested US guided paracentesis  Severe protein calorie malnutrition -Continue patient on prostat 30 mL po bid  Abnormal liver function -likely secondary to metastatic disease -LFT's overall stable -Will recheck CMP in AM -Abd [place] reviewed. Small to mod ascites noted. Patient is symptomatic with abd pain. Will request US guided paracentesis  Leukocytosis probably due to neulasta -Remains hemodynamically  -Will repeat CBC in AM  Anemia/ h/o pancytopenia -Hgb remains stable -Patient hemodynamically stable  Dm2 -metformin currently on hold -Will continue on SSI coverage as tolerated -Glucose trends reviewed. Stable thus far  Gerd -Cont PPI -Remained stable at present  Hyperlipidemia -Had stopped Red Yeast Rice due to abnormal liver function at presentation -Recheck LFT in AM  DVT prophylaxis: Lovenox subQ Code Status: Full Family Communication: Pt in room, family not at bedside Disposition Plan: Uncertain at this time  Consultants:     Procedures:     Antimicrobials: Anti-infectives (From admission,  onward)   None      Subjective: Reports feeling somewhat better today  Objective: Vitals:   12/14/17 1330 12/14/17 2043 12/15/17 0508 12/15/17 1454  BP: 107/62 125/73 120/71 126/78  Pulse: 98 (!) 103 100 (!) 104  Resp: (!) 24 20 18 12   Temp: 98.3 F (36.8 C) 98.2 F (36.8 C) 98.2 F (36.8 C) 98.4 F (36.9 C)  TempSrc: Oral  Oral   SpO2: 94% 96% 93% 96%  Weight:   130.5 kg   Height:        Intake/Output Summary (Last 24 hours) at 12/15/2017 1629 Last data filed at 12/15/2017 0737 Gross per 24 hour  Intake -  Output 2100 ml  Net -2100 ml   Filed Weights   12/14/17 0117 12/15/17 0508  Weight: 135.7 kg 130.5 kg    Examination: General exam: Conversant, in no acute distress Respiratory system: normal chest rise, clear, no audible wheezing Cardiovascular system: regular rhythm, s1-s2 Gastrointestinal system: Nondistended, nontender, pos BS Central nervous system: No seizures, no tremors Extremities: No cyanosis, no joint deformities Skin: No rashes, no pallor Psychiatry: Affect normal // no auditory hallucinations   Data Reviewed: I have personally reviewed following labs and imaging studies  CBC: Recent Labs  Lab 12/09/17 1002 12/13/17 2211 12/14/17 0354 12/15/17 0527  WBC 9.7 23.8* 29.5* 14.5*  NEUTROABS 7.5* 22.8*  --   --   HGB 9.2* 8.5* 8.6* 7.6*  HCT 27.1* 25.4* 25.6* 23.1*  MCV 98.7* 100.0 100.8* 100.9*  PLT 129* 82* 68* 40*   Basic Metabolic Panel: Recent Labs  Lab 12/09/17 1002 12/13/17 2211 12/14/17 0354 12/15/17 0527  NA 138 137 139 139  K 3.5 4.0 3.7  3.8  CL 98 98 98 97*  CO2 29 26 30 30   GLUCOSE 155* 152* 170* 187*  BUN 13 21 22 20   CREATININE 1.07 0.85 0.83 0.76  CALCIUM 8.1* 8.1* 8.2* 8.1*   GFR: Estimated Creatinine Clearance: 125.1 mL/min (by C-G formula based on SCr of 0.76 mg/dL). Liver Function Tests: Recent Labs  Lab 12/09/17 1002 12/13/17 2211 12/14/17 0354 12/15/17 0527  AST 44* 51* 46* 50*  ALT 30 33 33 33    ALKPHOS 172* 120 119 124  BILITOT 2.4* 1.8* 2.6* 2.1*  PROT 6.2* 6.0* 5.9* 5.7*  ALBUMIN 2.5* 2.6* 2.6* 2.6*   Recent Labs  Lab 12/13/17 2211  LIPASE 50   No results for input(s): AMMONIA in the last 168 hours. Coagulation Profile: Recent Labs  Lab 12/13/17 2211  INR 1.07   Cardiac Enzymes: Recent Labs  Lab 12/13/17 2211  TROPONINI <0.03   BNP (last 3 results) No results for input(s): PROBNP in the last 8760 hours. HbA1C: No results for input(s): HGBA1C in the last 72 hours. CBG: Recent Labs  Lab 12/14/17 1221 12/14/17 1627 12/14/17 2044 12/15/17 0734 12/15/17 1204  GLUCAP 198* 136* 185* 153* 213*   Lipid Profile: No results for input(s): CHOL, HDL, LDLCALC, TRIG, CHOLHDL, LDLDIRECT in the last 72 hours. Thyroid Function Tests: No results for input(s): TSH, T4TOTAL, FREET4, T3FREE, THYROIDAB in the last 72 hours. Anemia Panel: No results for input(s): VITAMINB12, FOLATE, FERRITIN, TIBC, IRON, RETICCTPCT in the last 72 hours. Sepsis Labs: No results for input(s): PROCALCITON, LATICACIDVEN in the last 168 hours.  No results found for this or any previous visit (from the past 240 hour(s)).   Radiology Studies: US Abdomen Complete  Result Date: 12/14/2017 CLINICAL DATA:  72 year old male with abdominal distention. EXAM: ABDOMEN ULTRASOUND COMPLETE COMPARISON:  CT of the abdomen pelvis dated 11/08/2017 FINDINGS: Evaluation is limited due to patient's body habitus, portable technique, and inability of the patient to cooperate with the exam. Gallbladder: No gallstones or wall thickening visualized. No sonographic Murphy sign noted by sonographer. Common bile duct: Diameter: 4 mm Liver: The liver is heterogeneous and echogenic, likely related to fatty infiltration. There is irregularity of the hepatic contour which may represent cirrhosis or pseudo cirrhosis. The known metastatic lesions are poorly visualized on this ultrasound. Portal vein is patent on color Doppler  imaging with normal direction of blood flow towards the liver. IVC: No abnormality visualized. Pancreas: Not visualized and obscured by bowel gas. Spleen: Size and appearance within normal limits. Right Kidney: Length: 11 cm. Normal echogenicity. No hydronephrosis or shadowing stone. Left Kidney: Length: 12 cm. Suboptimally visualized. No hydronephrosis or large shadowing stone. Abdominal aorta: Not well visualized. Other findings: There is small to moderate ascites. IMPRESSION: 1. Heterogeneous liver with findings of cirrhosis or pseudo cirrhosis. Known hepatic metastatic disease are suboptimally seen on this ultrasound. 2. Ascites. Electronically Signed   By: Anner Crete M.D.   On: 12/14/2017 03:48   Dg Chest Port 1 View  Result Date: 12/13/2017 CLINICAL DATA:  Patient with 10 pound weight gain. Shortness of breath. EXAM: PORTABLE CHEST 1 VIEW COMPARISON:  Chest radiograph 12/03/2017 FINDINGS: Stable cardiac and mediastinal contours. Bilateral interstitial pulmonary opacities. Low lung volumes. No definite pleural effusion or pneumothorax. IMPRESSION: Interstitial opacities may represent mild edema. Electronically Signed   By: Lovey Newcomer M.D.   On: 12/13/2017 20:50    Scheduled Meds: . feeding supplement (PRO-STAT SUGAR FREE 64)  30 mL Oral BID  . furosemide  40  mg Intravenous Q12H  . insulin aspart  0-5 Units Subcutaneous QHS  . insulin aspart  0-9 Units Subcutaneous TID WC  . loratadine  10 mg Oral Daily  . nicotine  21 mg Transdermal Daily  . pantoprazole  40 mg Oral Daily  . polyethylene glycol  17 g Oral Daily  . potassium chloride SA  20 mEq Oral BID  . sodium chloride flush  3 mL Intravenous Q12H   Continuous Infusions: . sodium chloride    . albumin human       LOS: 0 days   Marylu Lund, MD [facility] Pager On Amion  If 7PM-7AM, please contact night-coverage 12/15/2017, 4:29 PM

## 2017-12-16 ENCOUNTER — Inpatient Hospital Stay (HOSPITAL_COMMUNITY): Payer: BLUE CROSS/BLUE SHIELD

## 2017-12-16 DIAGNOSIS — K652 Spontaneous bacterial peritonitis: Secondary | ICD-10-CM

## 2017-12-16 DIAGNOSIS — R140 Abdominal distension (gaseous): Secondary | ICD-10-CM

## 2017-12-16 DIAGNOSIS — E8770 Fluid overload, unspecified: Secondary | ICD-10-CM

## 2017-12-16 LAB — BASIC METABOLIC PANEL
Anion gap: 9 (ref 5–15)
BUN: 18 mg/dL (ref 8–23)
CALCIUM: 8 mg/dL — AB (ref 8.9–10.3)
CO2: 29 mmol/L (ref 22–32)
Chloride: 98 mmol/L (ref 98–111)
Creatinine, Ser: 0.77 mg/dL (ref 0.61–1.24)
GLUCOSE: 166 mg/dL — AB (ref 70–99)
Potassium: 4 mmol/L (ref 3.5–5.1)
Sodium: 136 mmol/L (ref 135–145)

## 2017-12-16 LAB — BODY FLUID CELL COUNT WITH DIFFERENTIAL
Lymphs, Fluid: 9 %
MONOCYTE-MACROPHAGE-SEROUS FLUID: 21 % — AB (ref 50–90)
Neutrophil Count, Fluid: 70 % — ABNORMAL HIGH (ref 0–25)
Total Nucleated Cell Count, Fluid: 506 cu mm (ref 0–1000)

## 2017-12-16 LAB — CBC
HCT: 20.6 % — ABNORMAL LOW (ref 39.0–52.0)
Hemoglobin: 7 g/dL — ABNORMAL LOW (ref 13.0–17.0)
MCH: 34.3 pg — ABNORMAL HIGH (ref 26.0–34.0)
MCHC: 34 g/dL (ref 30.0–36.0)
MCV: 101 fL — ABNORMAL HIGH (ref 78.0–100.0)
PLATELETS: 23 10*3/uL — AB (ref 150–400)
RBC: 2.04 MIL/uL — ABNORMAL LOW (ref 4.22–5.81)
RDW: 18 % — AB (ref 11.5–15.5)
WBC: 5.5 10*3/uL (ref 4.0–10.5)

## 2017-12-16 LAB — PREPARE RBC (CROSSMATCH)

## 2017-12-16 LAB — GLUCOSE, CAPILLARY
GLUCOSE-CAPILLARY: 161 mg/dL — AB (ref 70–99)
Glucose-Capillary: 214 mg/dL — ABNORMAL HIGH (ref 70–99)
Glucose-Capillary: 166 mg/dL — ABNORMAL HIGH (ref 70–99)
Glucose-Capillary: 146 mg/dL — ABNORMAL HIGH (ref 70–99)

## 2017-12-16 LAB — GRAM STAIN

## 2017-12-16 MED ORDER — CIPROFLOXACIN IN D5W 400 MG/200ML IV SOLN
400.0000 mg | Freq: Two times a day (BID) | INTRAVENOUS | Status: DC
  Administered 2017-12-16 – 2017-12-23 (×15): 400 mg via INTRAVENOUS
  Filled 2017-12-16 (×16): qty 200

## 2017-12-16 MED ORDER — SODIUM CHLORIDE 0.9% IV SOLUTION
Freq: Once | INTRAVENOUS | Status: AC
  Administered 2017-12-16: 17:00:00 via INTRAVENOUS

## 2017-12-16 MED ORDER — TRAMADOL HCL 50 MG PO TABS
50.0000 mg | ORAL_TABLET | Freq: Four times a day (QID) | ORAL | Status: DC | PRN
  Administered 2017-12-16 – 2017-12-23 (×13): 50 mg via ORAL
  Filled 2017-12-16 (×13): qty 1

## 2017-12-16 MED ORDER — LIDOCAINE HCL 1 % IJ SOLN
INTRAMUSCULAR | Status: AC
  Filled 2017-12-16: qty 20

## 2017-12-16 NOTE — Progress Notes (Signed)
CRITICAL VALUE ALERT  Critical Value: Platelet 23  Date & Time Notied:  12/16/17 0510  Provider Notified: Silas [place]  Orders Received/Actions taken: No new orders

## 2017-12-16 NOTE — Progress Notes (Signed)
PROGRESS NOTE    Robert Compton  ATF:[id number] DOB: 1945-06-14 DOA: 12/13/2017 PCP: [name], MD    Brief Narrative:  72 y.o. male, w metastatic small cell lung cancer to liver and cervical spine , severe protein calorie malnutrition, h/o pancytopenia. Presents with 10 lbs of weight gain and increase in lower ext edema. Patient was admitted for concerns of heart failure  Assessment & Plan:   Principal Problem:   Edema Active Problems:   DM (diabetes mellitus), type 2 (HCC)   Anemia   Severe protein-calorie malnutrition (HCC)   Abnormal liver function   Leukocytosis  Edema -Recent 2d echo reviewed with findings of normal LVEF -Presently remains on 40mg  IV BID lasix with improvement noted thus far -Wt on admission: 135.6kg -Dry wt 128kg -Current weight: 130kg -Patient with symptomatic ascites. Will continue BID lasix IV per above -Have requested US guided paracentesis  Severe protein calorie malnutrition -Continue patient on prostat 30 mL po bid  Abnormal liver function -likely secondary to metastatic disease -LFT's overall stable -Will recheck CMP in AM -Abd [place] reviewed. Small to mod ascites noted. Patient is symptomatic with abd pain. Patient now s/p US guided paracentesis yielding over 2L of fluid -Fluid analysis worrisome for SBP, see below  Leukocytosis probably due to neulasta, now neutropenia -Remains hemodynamically  -WBC trending down rapidly, now concerns for developing neutropenia and in the setting of SBP, possible febrile neutropenia, see below -Repeat CBC in AM  Anemia/ h/o pancytopenia -Hgb is trending down -hgb 7.0 today. Will transfuse 1 unit PRBC -Patient hemodynamically stable -Repeat CBC in AM  Dm2 -metformin currently on hold -Will continue on SSI coverage as tolerated -Glucose trends reviewed. Remains stable thus far  Gerd -Cont PPI -Remained stable currently  Hyperlipidemia -Had stopped Red Yeast Rice due to abnormal  liver function at presentation -Recheck LFT in AM  SBP, new finding -Fluid analysis reviewed. 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 Discussed with pharmacy. Will start empiric ciprofloxacin -Repeat bmet and CMP in AM  DVT prophylaxis: Lovenox subQ Code Status: Full Family Communication: Pt in room, family not at bedside Disposition Plan: Uncertain at this time  Consultants:     Procedures:     Antimicrobials: Anti-infectives (From admission, onward)   Start     Dose/Rate Route Frequency Ordered Stop   12/16/17 1430  ciprofloxacin (CIPRO) IVPB 400 mg     400 mg 200 mL/hr over 60 Minutes Intravenous 2 times daily 12/16/17 1407        Subjective: Without complaints this AM  Objective: Vitals:   12/16/17 1126 12/16/17 1142 12/16/17 1146 12/16/17 1314  BP: 119/72 120/69 119/68 125/71  Pulse:    92  Resp:    16  Temp:    98.4 F (36.9 C)  TempSrc:    Oral  SpO2:    97%  Weight:      Height:        Intake/Output Summary (Last 24 hours) at 12/16/2017 1418 Last data filed at 12/16/2017 1257 Gross per 24 hour  Intake 240 ml  Output 3150 ml  Net -2910 ml   Filed Weights   12/14/17 0117 12/15/17 0508 12/16/17 0457  Weight: 135.7 kg 130.5 kg 128.2 kg    Examination: General exam: Conversant, in no acute distress Respiratory system: normal chest rise, clear,  no audible wheezing Cardiovascular system: regular rhythm, s1-s2 Gastrointestinal system: Nondistended, nontender, pos BS Central nervous system: No seizures, no tremors Extremities: No cyanosis, no joint deformities Skin: No rashes, no pallor Psychiatry: Affect normal // no auditory hallucinations   Data Reviewed: I have personally reviewed following labs and imaging studies  CBC: Recent Labs    Lab 10-Jan-2018 2211 12/14/17 0354 12/15/17 0527 12/16/17 0432  WBC 23.8* 29.5* 14.5* 5.5  NEUTROABS 22.8*  --   --   --   HGB 8.5* 8.6* 7.6* 7.0*  HCT 25.4* 25.6* 23.1* 20.6*  MCV 100.0 100.8* 100.9* 101.0*  PLT 82* 68* 40* 23*   Basic Metabolic Panel: Recent Labs  Lab 01/10/2018 2211 12/14/17 0354 12/15/17 0527 12/16/17 0432  NA 137 139 139 136  K 4.0 3.7 3.8 4.0  CL 98 98 97* 98  CO2 26 30 30 29   GLUCOSE 152* 170* 187* 166*  BUN 21 22 20 18   CREATININE 0.85 0.83 0.76 0.77  CALCIUM 8.1* 8.2* 8.1* 8.0*   GFR: Estimated Creatinine Clearance: 124 mL/min (by C-G formula based on SCr of 0.77 mg/dL). Liver Function Tests: Recent Labs  Lab 01/10/2018 2211 12/14/17 0354 12/15/17 0527  AST 51* 46* 50*  ALT 33 33 33  ALKPHOS 120 119 124  BILITOT 1.8* 2.6* 2.1*  PROT 6.0* 5.9* 5.7*  ALBUMIN 2.6* 2.6* 2.6*   Recent Labs  Lab 2018-01-10 2211  LIPASE 50   No results for input(s): AMMONIA in the last 168 hours. Coagulation Profile: Recent Labs  Lab 01-10-18 2211  INR 1.07   Cardiac Enzymes: Recent Labs  Lab 01-10-2018 2211  TROPONINI <0.03   BNP (last 3 results) No results for input(s): PROBNP in the last 8760 hours. HbA1C: No results for input(s): HGBA1C in the last 72 hours. CBG: Recent Labs  Lab 12/15/17 1204 12/15/17 1714 12/15/17 2054 12/16/17 0752 12/16/17 1213  GLUCAP 213* 137* 175* 161* 214*   Lipid Profile: No results for input(s): CHOL, HDL, LDLCALC, TRIG, CHOLHDL, LDLDIRECT in the last 72 hours. Thyroid Function Tests: No results for input(s): TSH, T4TOTAL, FREET4, T3FREE, THYROIDAB in the last 72 hours. Anemia Panel: No results for input(s): VITAMINB12, FOLATE, FERRITIN, TIBC, IRON, RETICCTPCT in the last 72 hours. Sepsis Labs: No results for input(s): PROCALCITON, LATICACIDVEN in the last 168 hours.  No results found for this or any previous visit (from the past 240 hour(s)).   Radiology Studies: US Paracentesis  Result Date:  12/16/2017 INDICATION: Abdominal distention. Newly diagnosed ascites. Request diagnostic and therapeutic paracentesis. EXAM: ULTRASOUND GUIDED LEFT LOWER QUADRANT PARACENTESIS MEDICATIONS: None. COMPLICATIONS: None immediate. PROCEDURE: Informed written consent was obtained from the patient after a discussion of the risks, benefits and alternatives to treatment. A timeout was performed prior to the initiation of the procedure. Initial ultrasound scanning demonstrates a moderate amount of ascites within the left lower abdominal quadrant. The left lower abdomen was prepped and draped in the usual sterile fashion. 1% lidocaine with epinephrine was used for local anesthesia. Following this, a 19 gauge, 10-cm, Yueh catheter was introduced. An ultrasound image was saved for documentation purposes. The paracentesis was performed. The catheter was removed and a dressing was applied. The patient tolerated the procedure well without immediate post procedural complication. FINDINGS: A total of approximately 2.4 L of clear yellow fluid was removed. Samples were sent to the laboratory as requested by the clinical team. IMPRESSION: Successful ultrasound-guided paracentesis yielding 2.4 liters of peritoneal fluid. Read by: Ascencion Dike PA-C Electronically Signed  By: Eugenie Filler M.D.   On: 12/16/2017 12:16    Scheduled Meds: . sodium chloride   Intravenous Once  . feeding supplement (PRO-STAT SUGAR FREE 64)  30 mL Oral BID  . furosemide  40 mg Intravenous Q12H  . insulin aspart  0-5 Units Subcutaneous QHS  . insulin aspart  0-9 Units Subcutaneous TID WC  . lidocaine      . loratadine  10 mg Oral Daily  . nicotine  21 mg Transdermal Daily  . pantoprazole  40 mg Oral Daily  . polyethylene glycol  17 g Oral Daily  . potassium chloride SA  20 mEq Oral BID  . sodium chloride flush  3 mL Intravenous Q12H   Continuous Infusions: . sodium chloride    . ciprofloxacin       LOS: 1 day   Marylu Lund, MD [facility] Pager On Amion  If 7PM-7AM, please contact night-coverage 12/16/2017, 2:18 PM

## 2017-12-17 ENCOUNTER — Inpatient Hospital Stay: Payer: BLUE CROSS/BLUE SHIELD | Attending: Internal Medicine

## 2017-12-17 ENCOUNTER — Telehealth: Payer: Self-pay | Admitting: Medical Oncology

## 2017-12-17 DIAGNOSIS — M1990 Unspecified osteoarthritis, unspecified site: Secondary | ICD-10-CM | POA: Insufficient documentation

## 2017-12-17 DIAGNOSIS — R5383 Other fatigue: Secondary | ICD-10-CM | POA: Insufficient documentation

## 2017-12-17 DIAGNOSIS — I10 Essential (primary) hypertension: Secondary | ICD-10-CM | POA: Insufficient documentation

## 2017-12-17 DIAGNOSIS — R590 Localized enlarged lymph nodes: Secondary | ICD-10-CM | POA: Insufficient documentation

## 2017-12-17 DIAGNOSIS — Z79899 Other long term (current) drug therapy: Secondary | ICD-10-CM | POA: Insufficient documentation

## 2017-12-17 DIAGNOSIS — R5381 Other malaise: Secondary | ICD-10-CM | POA: Insufficient documentation

## 2017-12-17 DIAGNOSIS — Z794 Long term (current) use of insulin: Secondary | ICD-10-CM | POA: Insufficient documentation

## 2017-12-17 DIAGNOSIS — M6281 Muscle weakness (generalized): Secondary | ICD-10-CM | POA: Insufficient documentation

## 2017-12-17 DIAGNOSIS — Z5111 Encounter for antineoplastic chemotherapy: Secondary | ICD-10-CM | POA: Insufficient documentation

## 2017-12-17 DIAGNOSIS — Z9221 Personal history of antineoplastic chemotherapy: Secondary | ICD-10-CM | POA: Insufficient documentation

## 2017-12-17 DIAGNOSIS — C3411 Malignant neoplasm of upper lobe, right bronchus or lung: Secondary | ICD-10-CM | POA: Insufficient documentation

## 2017-12-17 DIAGNOSIS — J90 Pleural effusion, not elsewhere classified: Secondary | ICD-10-CM | POA: Insufficient documentation

## 2017-12-17 DIAGNOSIS — C787 Secondary malignant neoplasm of liver and intrahepatic bile duct: Secondary | ICD-10-CM | POA: Insufficient documentation

## 2017-12-17 DIAGNOSIS — E119 Type 2 diabetes mellitus without complications: Secondary | ICD-10-CM | POA: Insufficient documentation

## 2017-12-17 LAB — GLUCOSE, CAPILLARY
GLUCOSE-CAPILLARY: 220 mg/dL — AB (ref 70–99)
GLUCOSE-CAPILLARY: 176 mg/dL — AB (ref 70–99)
Glucose-Capillary: 172 mg/dL — ABNORMAL HIGH (ref 70–99)
Glucose-Capillary: 194 mg/dL — ABNORMAL HIGH (ref 70–99)

## 2017-12-17 LAB — CBC WITH DIFFERENTIAL/PLATELET
BASOS ABS: 0 10*3/uL (ref 0.0–0.1)
BLASTS: 0 %
Band Neutrophils: 0 %
Basophils Absolute: 0 10*3/uL (ref 0.0–0.1)
Basophils Relative: 2 %
Basophils Relative: 1 %
EOS PCT: 1 %
Eosinophils Absolute: 0 10*3/uL (ref 0.0–0.7)
Eosinophils Absolute: 0 10*3/uL (ref 0.0–0.7)
Eosinophils Relative: 1 %
HEMATOCRIT: 23.3 % — AB (ref 39.0–52.0)
HEMATOCRIT: 21 % — AB (ref 39.0–52.0)
HEMOGLOBIN: 7.3 g/dL — AB (ref 13.0–17.0)
Hemoglobin: 7.8 g/dL — ABNORMAL LOW (ref 13.0–17.0)
LYMPHS ABS: 0.6 10*3/uL — AB (ref 0.7–4.0)
LYMPHS PCT: 39 %
LYMPHS PCT: 42 %
Lymphs Abs: 0.6 10*3/uL — ABNORMAL LOW (ref 0.7–4.0)
MCH: 32.5 pg (ref 26.0–34.0)
MCH: 32.4 pg (ref 26.0–34.0)
MCHC: 33.5 g/dL (ref 30.0–36.0)
MCHC: 34.8 g/dL (ref 30.0–36.0)
MCV: 97.1 fL (ref 78.0–100.0)
MCV: 93.3 fL (ref 78.0–100.0)
MONO ABS: 0 10*3/uL — AB (ref 0.1–1.0)
MONOS PCT: 2 %
Metamyelocytes Relative: 0 %
Monocytes Absolute: 0 10*3/uL — ABNORMAL LOW (ref 0.1–1.0)
Monocytes Relative: 1 %
Myelocytes: 0 %
NEUTROS ABS: 0.8 10*3/uL — AB (ref 1.7–7.7)
NEUTROS PCT: 57 %
NRBC: 0 /100{WBCs}
Neutro Abs: 1 10*3/uL — ABNORMAL LOW (ref 1.7–7.7)
Neutrophils Relative %: 54 %
OTHER: 0 %
PLATELETS: 13 10*3/uL — AB (ref 150–400)
Platelets: 9 10*3/uL — CL (ref 150–400)
Promyelocytes Relative: 0 %
RBC: 2.4 MIL/uL — ABNORMAL LOW (ref 4.22–5.81)
RBC: 2.25 MIL/uL — ABNORMAL LOW (ref 4.22–5.81)
RDW: 18.8 % — AB (ref 11.5–15.5)
RDW: 17.8 % — ABNORMAL HIGH (ref 11.5–15.5)
WBC: 1.6 10*3/uL — AB (ref 4.0–10.5)
WBC: 1.4 10*3/uL — AB (ref 4.0–10.5)

## 2017-12-17 LAB — COMPREHENSIVE METABOLIC PANEL
ALT: 37 U/L (ref 0–44)
ANION GAP: 10 (ref 5–15)
AST: 46 U/L — ABNORMAL HIGH (ref 15–41)
Albumin: 2.7 g/dL — ABNORMAL LOW (ref 3.5–5.0)
Alkaline Phosphatase: 123 U/L (ref 38–126)
BUN: 21 mg/dL (ref 8–23)
CO2: 29 mmol/L (ref 22–32)
CREATININE: 0.84 mg/dL (ref 0.61–1.24)
Calcium: 8.4 mg/dL — ABNORMAL LOW (ref 8.9–10.3)
Chloride: 96 mmol/L — ABNORMAL LOW (ref 98–111)
Glucose, Bld: 252 mg/dL — ABNORMAL HIGH (ref 70–99)
Potassium: 4.2 mmol/L (ref 3.5–5.1)
SODIUM: 135 mmol/L (ref 135–145)
TOTAL PROTEIN: 6.1 g/dL — AB (ref 6.5–8.1)
Total Bilirubin: 3.1 mg/dL — ABNORMAL HIGH (ref 0.3–1.2)

## 2017-12-17 MED ORDER — MORPHINE SULFATE (PF) 2 MG/ML IV SOLN
1.0000 mg | INTRAVENOUS | Status: DC | PRN
  Administered 2017-12-19 – 2017-12-23 (×4): 1 mg via INTRAVENOUS
  Filled 2017-12-17 (×4): qty 1

## 2017-12-17 MED ORDER — FUROSEMIDE 40 MG PO TABS
40.0000 mg | ORAL_TABLET | Freq: Two times a day (BID) | ORAL | Status: DC
  Administered 2017-12-18 – 2017-12-24 (×13): 40 mg via ORAL
  Filled 2017-12-17 (×14): qty 1

## 2017-12-17 MED ORDER — SODIUM CHLORIDE 0.9% IV SOLUTION
Freq: Once | INTRAVENOUS | Status: AC
  Administered 2017-12-18: 03:00:00 via INTRAVENOUS

## 2017-12-17 MED ORDER — HYDROCORTISONE ACETATE 25 MG RE SUPP
25.0000 mg | Freq: Two times a day (BID) | RECTAL | Status: DC | PRN
  Filled 2017-12-17 (×3): qty 1

## 2017-12-17 NOTE — Telephone Encounter (Signed)
Port placement cancelled due to pt in hospital and neutropenic. I extended order out to September 9

## 2017-12-17 NOTE — Progress Notes (Signed)
PT Cancellation Note  Patient Details Name: Robert Compton MRN: [id number] DOB: 1945/11/04   Cancelled Treatment:      Attempted to see am and pm.  Not having a good day.  "I feel like someone beat me with a baseball bat".  Pt stated he feels tired and "not up for it today".  Will attempt next day or so as schedule permits.    Rica Koyanagi  PTA [facility]  Acute  Rehab Pager      [phone]

## 2017-12-17 NOTE — Progress Notes (Signed)
PROGRESS NOTE    Robert Compton  ZOX:[id number] DOB: Nov 22, 1945 DOA: 12/13/2017 PCP: [name], MD    Brief Narrative:  72 y.o. male, w metastatic small cell lung cancer to liver and cervical spine , severe protein calorie malnutrition, h/o pancytopenia. Presents with 10 lbs of weight gain and increase in lower ext edema. Patient was admitted for concerns of heart failure  Assessment & Plan:   Principal Problem:   Edema Active Problems:   DM (diabetes mellitus), type 2 (HCC)   Anemia   Severe protein-calorie malnutrition (HCC)   Abnormal liver function   Leukocytosis   SBP (spontaneous bacterial peritonitis) (HCC)   Abdominal distention   Hypervolemia  Edema -Recent 2d echo reviewed with findings of normal LVEF -Presently remains on 40mg  IV BID lasix with improvement noted thus far -Wt on admission: 135.6kg -Dry wt 128kg -Current weight: 128.4kg -Patient with symptomatic ascites s/p paracentesis, see below -LE still edematous. Will continue IV lasix for now -Repeat bmet in AM -Continues to tolerate diuresis thus far  Severe protein calorie malnutrition -Continue patient on prostat 30 mL po bid -Stable at presnt  Abnormal liver function -likely secondary to metastatic disease -LFT's reviewed and presently stable -Repeat CMP in AM -Recent abd [place] reviewed. Small to mod ascites noted. Patient noted to be symptomatic with abd pain. Patient now s/p US guided paracentesis yielding over 2L of fluid -Fluid analysis worrisome for SBP, see below  Leukocytosis probably due to neulasta, now pancytopenia -Remains hemodynamically  -WBC trending down rapidly, now concerns for developing neutropenia in the next day of so -Calculated ANC of over 900 today -Repeat CBC in AM  Anemia/ h/o pancytopenia -Hgb trended down to 7.0, given one unit PRBC -Patient hemodynamically stable -Recheck CBC in AM  Dm2 -metformin presently remains on hold -Will continue on SSI coverage  as tolerated -Glucose trends were reviewed. Stable  Gerd -Cont PPI -Stable at present  Hyperlipidemia -Had stopped Red Yeast Rice due to abnormal liver function at presentation -Repeat LFT in AM  SBP, new finding -Fluid analysis reviewed. Findings worrisome for SBP -Patient is becoming more pancytopenic in setting of recent chemo -Presently not neutropenic, see above -fluid culture pending today -Patient has hx of anaphylaxis to beta lactam. Discussed with pharmacy. Continued on empiric ciprofloxacin -Recheck CMP in AM  DVT prophylaxis: Lovenox subQ Code Status: Full Family Communication: Pt in room, family not at bedside Disposition Plan: Uncertain at this time  Consultants:     Procedures:     Antimicrobials: Anti-infectives (From admission, onward)   Start     Dose/Rate Route Frequency Ordered Stop   12/16/17 1430  ciprofloxacin (CIPRO) IVPB 400 mg     400 mg 200 mL/hr over 60 Minutes Intravenous 2 times daily 12/16/17 1407        Subjective: Complains of lower abd discomfort this AM  Objective: Vitals:   12/16/17 1702 12/16/17 1727 12/16/17 2045 12/17/17 0530  BP:  131/81 114/71 112/68  Pulse:  92 99 91  Resp:  17 18 16   Temp:  98.5 F (36.9 C) 98.4 F (36.9 C) 98.3 F (36.8 C)  TempSrc:  Oral Oral Oral  SpO2: 92% 96%  94%  Weight:    128.4 kg  Height:        Intake/Output Summary (Last 24 hours) at 12/17/2017 1105 Last data filed at 12/17/2017 0820 Gross per 24 hour  Intake 1645.07 ml  Output 2375 ml  Net -729.93 ml   Filed Weights   12/15/17  8032 12/16/17 0457 12/17/17 0530  Weight: 130.5 kg 128.2 kg 128.4 kg    Examination: General exam: Awake, laying in bed, in nad Respiratory system: Normal respiratory effort, no wheezing Cardiovascular system: regular rate, s1, s2 Gastrointestinal system: Soft, mildly nondistended, positive BS Central nervous system: CN2-12 grossly intact, strength intact Extremities: Perfused, no clubbing Skin:  Normal skin turgor, no notable skin lesions seen Psychiatry: Mood normal // no visual hallucinations   Data Reviewed: I have personally reviewed following labs and imaging studies  CBC: Recent Labs  Lab 12/13/17 2211 12/14/17 0354 12/15/17 0527 12/16/17 0432 12/17/17 0425  WBC 23.8* 29.5* 14.5* 5.5 1.6*  NEUTROABS 22.8*  --   --   --  1.0*  HGB 8.5* 8.6* 7.6* 7.0* 7.8*  HCT 25.4* 25.6* 23.1* 20.6* 23.3*  MCV 100.0 100.8* 100.9* 101.0* 97.1  PLT 82* 68* 40* 23* 13*   Basic Metabolic Panel: Recent Labs  Lab 12/13/17 2211 12/14/17 0354 12/15/17 0527 12/16/17 0432 12/17/17 0425  NA 137 139 139 136 135  K 4.0 3.7 3.8 4.0 4.2  CL 98 98 97* 98 96*  CO2 26 30 30 29 29   GLUCOSE 152* 170* 187* 166* 252*  BUN 21 22 20 18 21   CREATININE 0.85 0.83 0.76 0.77 0.84  CALCIUM 8.1* 8.2* 8.1* 8.0* 8.4*   GFR: Estimated Creatinine Clearance: 118.2 mL/min (by C-G formula based on SCr of 0.84 mg/dL). Liver Function Tests: Recent Labs  Lab 12/13/17 2211 12/14/17 0354 12/15/17 0527 12/17/17 0425  AST 51* 46* 50* 46*  ALT 33 33 33 37  ALKPHOS 120 119 124 123  BILITOT 1.8* 2.6* 2.1* 3.1*  PROT 6.0* 5.9* 5.7* 6.1*  ALBUMIN 2.6* 2.6* 2.6* 2.7*   Recent Labs  Lab 12/13/17 2211  LIPASE 50   No results for input(s): AMMONIA in the last 168 hours. Coagulation Profile: Recent Labs  Lab 12/13/17 2211  INR 1.07   Cardiac Enzymes: Recent Labs  Lab 12/13/17 2211  TROPONINI <0.03   BNP (last 3 results) No results for input(s): PROBNP in the last 8760 hours. HbA1C: No results for input(s): HGBA1C in the last 72 hours. CBG: Recent Labs  Lab 12/16/17 0752 12/16/17 1213 12/16/17 1635 12/16/17 2226 12/17/17 0829  GLUCAP 161* 214* 166* 146* 172*   Lipid Profile: No results for input(s): CHOL, HDL, LDLCALC, TRIG, CHOLHDL, LDLDIRECT in the last 72 hours. Thyroid Function Tests: No results for input(s): TSH, T4TOTAL, FREET4, T3FREE, THYROIDAB in the last 72 hours. Anemia  Panel: No results for input(s): VITAMINB12, FOLATE, FERRITIN, TIBC, IRON, RETICCTPCT in the last 72 hours. Sepsis Labs: No results for input(s): PROCALCITON, LATICACIDVEN in the last 168 hours.  Recent Results (from the past 240 hour(s))  Gram stain     Status: None   Collection Time: 12/16/17 11:43 AM  Result Value Ref Range Status   Specimen Description PERITONEAL  Final   Special Requests NONE  Final   Gram Stain   Final    WBC PRESENT,BOTH PMN AND MONONUCLEAR NO ORGANISMS SEEN CYTOSPIN SMEAR Performed at [facility], [address] [place], [place] [address]    Report Status 12/16/2017 FINAL  Final     Radiology Studies: US Paracentesis  Result Date: 12/16/2017 INDICATION: Abdominal distention. Newly diagnosed ascites. Request diagnostic and therapeutic paracentesis. EXAM: ULTRASOUND GUIDED LEFT LOWER QUADRANT PARACENTESIS MEDICATIONS: None. COMPLICATIONS: None immediate. PROCEDURE: Informed written consent was obtained from the patient after a discussion of the risks, benefits and alternatives to treatment. A timeout  was performed prior to the initiation of the procedure. Initial ultrasound scanning demonstrates a moderate amount of ascites within the left lower abdominal quadrant. The left lower abdomen was prepped and draped in the usual sterile fashion. 1% lidocaine with epinephrine was used for local anesthesia. Following this, a 19 gauge, 10-cm, Yueh catheter was introduced. An ultrasound image was saved for documentation purposes. The paracentesis was performed. The catheter was removed and a dressing was applied. The patient tolerated the procedure well without immediate post procedural complication. FINDINGS: A total of approximately 2.4 L of clear yellow fluid was removed. Samples were sent to the laboratory as requested by the clinical team. IMPRESSION: Successful ultrasound-guided paracentesis yielding 2.4 liters of peritoneal fluid. Read by: Ascencion Dike PA-C  Electronically Signed   By: Jerilynn Mages.  Shick M.D.   On: 12/16/2017 12:16    Scheduled Meds: . feeding supplement (PRO-STAT SUGAR FREE 64)  30 mL Oral BID  . furosemide  40 mg Intravenous Q12H  . insulin aspart  0-5 Units Subcutaneous QHS  . insulin aspart  0-9 Units Subcutaneous TID WC  . loratadine  10 mg Oral Daily  . nicotine  21 mg Transdermal Daily  . pantoprazole  40 mg Oral Daily  . polyethylene glycol  17 g Oral Daily  . potassium chloride SA  20 mEq Oral BID  . sodium chloride flush  3 mL Intravenous Q12H   Continuous Infusions: . sodium chloride    . ciprofloxacin Stopped (12/16/17 2147)     LOS: 2 days   Marylu Lund, MD [facility] Pager On Amion  If 7PM-7AM, please contact night-coverage 12/17/2017, 11:05 AM

## 2017-12-17 NOTE — Progress Notes (Signed)
CRITICAL VALUE ALERT  Critical Value:  WBC 1.4, PLT 9  Date & Time Notied:  12/17/17 1852  Provider Notified: [facility]  Orders Received/Actions taken:

## 2017-12-18 ENCOUNTER — Other Ambulatory Visit (HOSPITAL_COMMUNITY): Payer: BLUE CROSS/BLUE SHIELD

## 2017-12-18 ENCOUNTER — Ambulatory Visit (HOSPITAL_COMMUNITY): Payer: BLUE CROSS/BLUE SHIELD

## 2017-12-18 DIAGNOSIS — T451X5A Adverse effect of antineoplastic and immunosuppressive drugs, initial encounter: Secondary | ICD-10-CM

## 2017-12-18 DIAGNOSIS — D61810 Antineoplastic chemotherapy induced pancytopenia: Secondary | ICD-10-CM

## 2017-12-18 LAB — DIC (DISSEMINATED INTRAVASCULAR COAGULATION)PANEL
D-Dimer, Quant: 9.63 ug/mL-FEU — ABNORMAL HIGH (ref 0.00–0.50)
Fibrinogen: 421 mg/dL (ref 210–475)
INR: 1.15
Platelets: 25 10*3/uL — CL (ref 150–400)
Prothrombin Time: 14.6 seconds (ref 11.4–15.2)
Smear Review: NONE SEEN

## 2017-12-18 LAB — CBC WITH DIFFERENTIAL/PLATELET
Basophils Absolute: 0 10*3/uL (ref 0.0–0.1)
Basophils Relative: 1 %
EOS PCT: 0 %
Eosinophils Absolute: 0 10*3/uL (ref 0.0–0.7)
HCT: 20.6 % — ABNORMAL LOW (ref 39.0–52.0)
Hemoglobin: 7 g/dL — ABNORMAL LOW (ref 13.0–17.0)
Lymphocytes Relative: 66 %
Lymphs Abs: 0.6 10*3/uL — ABNORMAL LOW (ref 0.7–4.0)
MCH: 33.2 pg (ref 26.0–34.0)
MCHC: 34 g/dL (ref 30.0–36.0)
MCV: 97.6 fL (ref 78.0–100.0)
MONO ABS: 0 10*3/uL — AB (ref 0.1–1.0)
Monocytes Relative: 2 %
NEUTROS ABS: 0.2 10*3/uL — AB (ref 1.7–7.7)
NEUTROS PCT: 31 %
Platelets: 17 10*3/uL — CL (ref 150–400)
RBC: 2.11 MIL/uL — ABNORMAL LOW (ref 4.22–5.81)
RDW: 17.6 % — ABNORMAL HIGH (ref 11.5–15.5)
WBC: 0.8 10*3/uL — CL (ref 4.0–10.5)

## 2017-12-18 LAB — PREPARE RBC (CROSSMATCH)

## 2017-12-18 LAB — GLUCOSE, CAPILLARY
GLUCOSE-CAPILLARY: 160 mg/dL — AB (ref 70–99)
GLUCOSE-CAPILLARY: 160 mg/dL — AB (ref 70–99)
Glucose-Capillary: 257 mg/dL — ABNORMAL HIGH (ref 70–99)
Glucose-Capillary: 155 mg/dL — ABNORMAL HIGH (ref 70–99)

## 2017-12-18 LAB — BASIC METABOLIC PANEL
ANION GAP: 8 (ref 5–15)
BUN: 20 mg/dL (ref 8–23)
CALCIUM: 8.3 mg/dL — AB (ref 8.9–10.3)
CO2: 31 mmol/L (ref 22–32)
Chloride: 95 mmol/L — ABNORMAL LOW (ref 98–111)
Creatinine, Ser: 0.82 mg/dL (ref 0.61–1.24)
GLUCOSE: 157 mg/dL — AB (ref 70–99)
Potassium: 4 mmol/L (ref 3.5–5.1)
SODIUM: 134 mmol/L — AB (ref 135–145)

## 2017-12-18 LAB — DIC (DISSEMINATED INTRAVASCULAR COAGULATION) PANEL: APTT: 33 s (ref 24–36)

## 2017-12-18 MED ORDER — SODIUM CHLORIDE 0.9% IV SOLUTION: Freq: Once | INTRAVENOUS | Status: DC

## 2017-12-18 NOTE — Progress Notes (Signed)
PT Cancellation Note  Patient Details Name: Robert Compton MRN: [id number] DOB: 07/02/1945   Cancelled Treatment:     Unable to tolerate.  Pt feeling "bad" also receiving Platelets and blood.  Will continue to monitor for mobility.    Rica Koyanagi  PTA [facility]  Acute  Rehab Pager      [phone]

## 2017-12-18 NOTE — Plan of Care (Signed)
Pt eating well. Pt diuresing well.

## 2017-12-18 NOTE — Progress Notes (Signed)
PROGRESS NOTE  Robert Compton QHU:[id number] DOB: [birth year] DOA: 12/13/2017 PCP: [name], MD  HPI/Recap of past 24 hours: 72 y.o.male with hx of metastatic small cell lung cancer to liver and cervical spine on chemotherapy, severe protein calorie malnutrition, h/o pancytopenia, presents with 10 lbs of weight gain and increase in lower ext edema. Patient was admitted for further management.  Today, pt denies any new complaints. Denies any recent bleeding, fever/chills, chest pain, SOB, abdominal pain, N/V/D.  Assessment/Plan: Principal Problem:   Edema Active Problems:   DM (diabetes mellitus), type 2 (HCC)   Anemia   Severe protein-calorie malnutrition (HCC)   Abnormal liver function   Leukocytosis   SBP (spontaneous bacterial peritonitis) (HCC)   Abdominal distention   Hypervolemia  Anasarca likely 2/2 severe protein calorie malnutrition unlikely acute diastolic HF BLE, weight gain on admission, BNP 63.1 CXR showed interstitial opacities likely mild edema  Recent 2d echo with EF of 50-55%, Grade 1 DD Continue PO lasix Continue patient on prostat 30 mL po bid  Possible SBP with ascites Afebrile, with leukopenia (neutropenic) S/P US guided paracentesis yielding over 2L of fluid Fluid analysis worrisome for SBP Fluid culture NGTD Patient has hx of anaphylaxis to beta lactam. Discussed with pharmacy. Continued on empiric ciprofloxacin Monitor closely  Abnormal liver function/ascites Likely secondary to metastatic disease Recent abd [place] reviewed. Small to mod ascites noted Daily CMP  Pancytopenia likely due to recent chemotherapy/malignancy  Leukopenia (neutropenic), thrombocytopenia, normocytic anemia DIC panel negative S/P plt transfusion on 12/17/17, s/p 1U of PRBC on 12/16/17, will transfuse 1U of PRBC today  Spoke to Dr Julien Nordmann (oncology), who stated pancytopenia is expected given recent chemotherapy use. Expects it to improve over time without any intervention.  Transfuse PLT if <20,000 and PRBC if hgb <8. Will see patient Transfuse PLT, PRBC prn Neutropenic precautions  DM type 2 Last A1c 09/2017 was 7.2 SSI, accuchecks Hold home metformin  GERD Cont PPI  Hyperlipidemia Had stopped Red Yeast Rice due to abnormal liver function at presentation  Metastatic SCLC to liver and cervical spine Follows with Oncology Dr Julien Nordmann Dr Julien Nordmann notified of admission  Tobacco abuse Advised to quit Nicotine patch ordered     Code Status: Full  Family Communication: None at bedside  Disposition Plan: To be determined   Consultants:  Oncology   Procedures:  None   Antimicrobials:  Ciprofloxacin  DVT prophylaxis: SCDs   Objective: Vitals:   12/18/17 0500 12/18/17 0541 12/18/17 1351 12/18/17 1356  BP:  (!) 113/55  105/62  Pulse:  (!) 101  91  Resp:  16  18  Temp:  98.6 F (37 C)  98.6 F (37 C)  TempSrc:  Oral  Oral  SpO2:  94% 90% 90%  Weight: 120.7 kg     Height:        Intake/Output Summary (Last 24 hours) at 12/18/2017 1458 Last data filed at 12/18/2017 1300 Gross per 24 hour  Intake 1245.63 ml  Output 3300 ml  Net -2054.37 ml   Filed Weights   12/16/17 0457 12/17/17 0530 12/18/17 0500  Weight: 128.2 kg 128.4 kg 120.7 kg    Exam:   General: NAD, chronically ill appearing  Cardiovascular: S1, S2 present  Respiratory: CTAB  Abdomen: Soft, distended, NT, BS present  Musculoskeletal: Trace pedal edema bilaterally  Skin: Chronic venous skin changes  Psychiatry: Normal mood   Data Reviewed: CBC: Recent Labs  Lab 12/13/17 2211  12/15/17 0527 12/16/17 0432 12/17/17 0425 12/17/17 1542 12/18/17  9390 12/18/17 0824  WBC 23.8*   < > 14.5* 5.5 1.6* 1.4* 0.8*  --   NEUTROABS 22.8*  --   --   --  1.0* 0.8* 0.2*  --   HGB 8.5*   < > 7.6* 7.0* 7.8* 7.3* 7.0*  --   HCT 25.4*   < > 23.1* 20.6* 23.3* 21.0* 20.6*  --   MCV 100.0   < > 100.9* 101.0* 97.1 93.3 97.6  --   PLT 82*   < > 40* 23* 13* 9* 17* 25*     < > = values in this interval not displayed.   Basic Metabolic Panel: Recent Labs  Lab 12/14/17 0354 12/15/17 0527 12/16/17 0432 12/17/17 0425 12/18/17 0427  NA 139 139 136 135 134*  K 3.7 3.8 4.0 4.2 4.0  CL 98 97* 98 96* 95*  CO2 30 30 29 29 31   GLUCOSE 170* 187* 166* 252* 157*  BUN 22 20 18 21 20   CREATININE 0.83 0.76 0.77 0.84 0.82  CALCIUM 8.2* 8.1* 8.0* 8.4* 8.3*   GFR: Estimated Creatinine Clearance: 117.4 mL/min (by C-G formula based on SCr of 0.82 mg/dL). Liver Function Tests: Recent Labs  Lab 12/13/17 2211 12/14/17 0354 12/15/17 0527 12/17/17 0425  AST 51* 46* 50* 46*  ALT 33 33 33 37  ALKPHOS 120 119 124 123  BILITOT 1.8* 2.6* 2.1* 3.1*  PROT 6.0* 5.9* 5.7* 6.1*  ALBUMIN 2.6* 2.6* 2.6* 2.7*   Recent Labs  Lab 12/13/17 2211  LIPASE 50   No results for input(s): AMMONIA in the last 168 hours. Coagulation Profile: Recent Labs  Lab 12/13/17 2211 12/18/17 0824  INR 1.07 1.15   Cardiac Enzymes: Recent Labs  Lab 12/13/17 2211  TROPONINI <0.03   BNP (last 3 results) No results for input(s): PROBNP in the last 8760 hours. HbA1C: No results for input(s): HGBA1C in the last 72 hours. CBG: Recent Labs  Lab 12/17/17 1314 12/17/17 1714 12/17/17 2116 12/18/17 0754 12/18/17 1205  GLUCAP 220* 194* 176* 160* 257*   Lipid Profile: No results for input(s): CHOL, HDL, LDLCALC, TRIG, CHOLHDL, LDLDIRECT in the last 72 hours. Thyroid Function Tests: No results for input(s): TSH, T4TOTAL, FREET4, T3FREE, THYROIDAB in the last 72 hours. Anemia Panel: No results for input(s): VITAMINB12, FOLATE, FERRITIN, TIBC, IRON, RETICCTPCT in the last 72 hours. Urine analysis:    Component Value Date/Time   COLORURINE YELLOW 12/13/2017 2222   APPEARANCEUR CLEAR 12/13/2017 2222   LABSPEC 1.008 12/13/2017 2222   PHURINE 5.0 12/13/2017 2222   GLUCOSEU NEGATIVE 12/13/2017 2222   HGBUR NEGATIVE 12/13/2017 2222   BILIRUBINUR NEGATIVE 12/13/2017 2222   KETONESUR  NEGATIVE 12/13/2017 2222   PROTEINUR NEGATIVE 12/13/2017 2222   NITRITE NEGATIVE 12/13/2017 2222   LEUKOCYTESUR NEGATIVE 12/13/2017 2222   Sepsis Labs: @LABRCNTIP (procalcitonin:4,lacticidven:4)  ) Recent Results (from the past 240 hour(s))  Culture, body fluid-bottle     Status: None (Preliminary result)   Collection Time: 12/16/17 11:43 AM  Result Value Ref Range Status   Specimen Description PERITONEAL  Final   Special Requests NONE  Final   Culture   Final    NO GROWTH 2 DAYS Performed at [facility], [place] [address], [place], [place] [address]    Report Status PENDING  Incomplete  Gram stain     Status: None   Collection Time: 12/16/17 11:43 AM  Result Value Ref Range Status   Specimen Description PERITONEAL  Final   Special Requests NONE  Final  Gram Stain   Final    WBC PRESENT,BOTH PMN AND MONONUCLEAR NO ORGANISMS SEEN CYTOSPIN SMEAR Performed at [facility], [place] [address], [place], [place] [address]    Report Status 12/16/2017 FINAL  Final      Studies: No results found.  Scheduled Meds: . sodium chloride   Intravenous Once  . feeding supplement (PRO-STAT SUGAR FREE 64)  30 mL Oral BID  . furosemide  40 mg Oral BID  . insulin aspart  0-5 Units Subcutaneous QHS  . insulin aspart  0-9 Units Subcutaneous TID WC  . loratadine  10 mg Oral Daily  . nicotine  21 mg Transdermal Daily  . pantoprazole  40 mg Oral Daily  . polyethylene glycol  17 g Oral Daily  . potassium chloride SA  20 mEq Oral BID  . sodium chloride flush  3 mL Intravenous Q12H    Continuous Infusions: . sodium chloride 250 mL (12/17/17 2219)  . ciprofloxacin 400 mg (12/18/17 1044)     LOS: 3 days     Alma Friendly, MD [facility]  If 7PM-7AM, please contact night-coverage [email or web address] Password [email or web address] 12/18/2017, 2:58 PM

## 2017-12-19 LAB — GLUCOSE, CAPILLARY
GLUCOSE-CAPILLARY: 181 mg/dL — AB (ref 70–99)
GLUCOSE-CAPILLARY: 220 mg/dL — AB (ref 70–99)
Glucose-Capillary: 158 mg/dL — ABNORMAL HIGH (ref 70–99)

## 2017-12-19 LAB — BASIC METABOLIC PANEL
Anion gap: 8 (ref 5–15)
BUN: 21 mg/dL (ref 8–23)
CO2: 30 mmol/L (ref 22–32)
CREATININE: 0.81 mg/dL (ref 0.61–1.24)
Calcium: 8.3 mg/dL — ABNORMAL LOW (ref 8.9–10.3)
Chloride: 95 mmol/L — ABNORMAL LOW (ref 98–111)
GFR calc Af Amer: >60 mL/min (ref >60)
GLUCOSE: 177 mg/dL — AB (ref 70–99)
POTASSIUM: 4 mmol/L (ref 3.5–5.1)
Sodium: 133 mmol/L — ABNORMAL LOW (ref 135–145)

## 2017-12-19 LAB — CBC WITH DIFFERENTIAL/PLATELET
BASOS PCT: 4 %
Basophils Absolute: 0 10*3/uL (ref 0.0–0.1)
EOS PCT: 2 %
Eosinophils Absolute: 0 10*3/uL (ref 0.0–0.7)
HCT: 22.8 % — ABNORMAL LOW (ref 39.0–52.0)
Hemoglobin: 7.9 g/dL — ABNORMAL LOW (ref 13.0–17.0)
LYMPHS ABS: 0.6 10*3/uL — AB (ref 0.7–4.0)
Lymphocytes Relative: 83 %
MCH: 32.9 pg (ref 26.0–34.0)
MCHC: 34.6 g/dL (ref 30.0–36.0)
MCV: 95 fL (ref 78.0–100.0)
MONO ABS: 0 10*3/uL — AB (ref 0.1–1.0)
Monocytes Relative: 4 %
NEUTROS ABS: 0 10*3/uL — AB (ref 1.7–7.7)
Neutrophils Relative %: 7 %
PLATELETS: 10 10*3/uL — AB (ref 150–400)
RBC: 2.4 MIL/uL — ABNORMAL LOW (ref 4.22–5.81)
RDW: 16.9 % — AB (ref 11.5–15.5)
WBC: 0.6 10*3/uL — CL (ref 4.0–10.5)

## 2017-12-19 LAB — BPAM PLATELET PHERESIS
BLOOD PRODUCT EXPIRATION DATE: 201909042359
ISSUE DATE / TIME: 201909040024
UNIT TYPE AND RH: 6200

## 2017-12-19 LAB — PREPARE PLATELET PHERESIS: Unit division: 0

## 2017-12-19 MED ORDER — SODIUM CHLORIDE 0.9% IV SOLUTION
Freq: Once | INTRAVENOUS | Status: AC
  Administered 2017-12-19: 12:00:00 via INTRAVENOUS

## 2017-12-19 NOTE — Progress Notes (Signed)
PROGRESS NOTE  Robert Compton YPP:[id number] DOB: [birth year] DOA: 12/13/2017 PCP: [name], MD  HPI/Recap of past 24 hours: 72 y.o.male with hx of metastatic small cell lung cancer to liver and cervical spine on chemotherapy, severe protein calorie malnutrition, h/o pancytopenia, presents with 10 lbs of weight gain and increase in lower ext edema. Patient was admitted for further management.  Today, pt continues to deny any new complaints. Denies any bleeding, fever/chills, chest pain, SOB, abdominal pain, N/V/D.  Assessment/Plan: Principal Problem:   Edema Active Problems:   DM (diabetes mellitus), type 2 (HCC)   Anemia   Severe protein-calorie malnutrition (HCC)   Abnormal liver function   Leukocytosis   SBP (spontaneous bacterial peritonitis) (HCC)   Abdominal distention   Hypervolemia  Anasarca likely 2/2 severe protein calorie malnutrition unlikely acute diastolic HF BLE, weight gain on admission, BNP 63.1 CXR showed interstitial opacities likely mild edema  Recent 2d echo with EF of 50-55%, Grade 1 DD Continue PO lasix Continue patient on prostat 30 mL po bid Nutrition on board  Possible SBP with ascites Afebrile, with leukopenia (neutropenic) S/P US guided paracentesis yielding over 2L of fluid Fluid analysis worrisome for SBP Fluid culture NGTD Patient has hx of anaphylaxis to beta lactam. Discussed with pharmacy. Continued on empiric ciprofloxacin Monitor closely  Abnormal liver function/ascites Likely secondary to metastatic disease Recent abd [place] reviewed. Small to mod ascites noted Daily CMP  Pancytopenia likely due to recent chemotherapy/malignancy  Leukopenia (neutropenic), thrombocytopenia, normocytic anemia DIC panel negative S/P plt transfusion on 12/17/17, s/p 1U of PRBC on 12/16/17 and 12/18/17 Will transfuse another 1U of PLT today Spoke to Dr Julien Nordmann (oncology) on 12/18/17, who stated pancytopenia is expected given recent chemotherapy use.  Expects it to improve over time without any intervention Transfuse PLT if <20,000 and PRBC if hgb <8. Will see patient Transfuse PLT, PRBC prn Neutropenic precautions  DM type 2 Last A1c 09/2017 was 7.2 SSI, accuchecks Hold home metformin  GERD Cont PPI  Hyperlipidemia Had stopped Red Yeast Rice due to abnormal liver function at presentation  Metastatic SCLC to liver and cervical spine Follows with Oncology Dr Julien Nordmann Dr Julien Nordmann notified of admission, stated he will see patient  Tobacco abuse Advised to quit Nicotine patch ordered     Code Status: Full  Family Communication: None at bedside  Disposition Plan: To be determined   Consultants:  Oncology   Procedures:  None   Antimicrobials:  Ciprofloxacin  DVT prophylaxis: SCDs   Objective: Vitals:   12/19/17 0915 12/19/17 1114 12/19/17 1147 12/19/17 1418  BP: 130/72 117/71 122/70 112/75  Pulse: 91 91 87 90  Resp: 20 20 18 16   Temp: 98.9 F (37.2 C) 99.1 F (37.3 C) 98.4 F (36.9 C) 99.1 F (37.3 C)  TempSrc: Oral Oral Oral Oral  SpO2: 99% 97% 93% 97%  Weight:      Height:        Intake/Output Summary (Last 24 hours) at 12/19/2017 1515 Last data filed at 12/19/2017 1328 Gross per 24 hour  Intake 1167 ml  Output 1850 ml  Net -683 ml   Filed Weights   12/17/17 0530 12/18/17 0500 12/19/17 0421  Weight: 128.4 kg 120.7 kg 121.4 kg    Exam:   General: NAD, chronically ill appearing  Cardiovascular: S1, S2 present  Respiratory: CTAB  Abdomen: Soft, distended, NT, BS present  Musculoskeletal: No pedal edema bilaterally  Skin: BLE chronic venous skin changes  Psychiatry: Normal mood   Data Reviewed:  CBC: Recent Labs  Lab 12/13/17 2211  12/16/17 0432 12/17/17 0425 12/17/17 1542 12/18/17 0427 12/18/17 0824 12/19/17 0413  WBC 23.8*   < > 5.5 1.6* 1.4* 0.8*  --  0.6*  NEUTROABS 22.8*  --   --  1.0* 0.8* 0.2*  --  0.0*  HGB 8.5*   < > 7.0* 7.8* 7.3* 7.0*  --  7.9*  HCT 25.4*    < > 20.6* 23.3* 21.0* 20.6*  --  22.8*  MCV 100.0   < > 101.0* 97.1 93.3 97.6  --  95.0  PLT 82*   < > 23* 13* 9* 17* 25* 10*   < > = values in this interval not displayed.   Basic Metabolic Panel: Recent Labs  Lab 12/15/17 0527 12/16/17 0432 12/17/17 0425 12/18/17 0427 12/19/17 0413  NA 139 136 135 134* 133*  K 3.8 4.0 4.2 4.0 4.0  CL 97* 98 96* 95* 95*  CO2 30 29 29 31 30   GLUCOSE 187* 166* 252* 157* 177*  BUN 20 18 21 20 21   CREATININE 0.76 0.77 0.84 0.82 0.81  CALCIUM 8.1* 8.0* 8.4* 8.3* 8.3*   GFR: Estimated Creatinine Clearance: 119.2 mL/min (by C-G formula based on SCr of 0.81 mg/dL). Liver Function Tests: Recent Labs  Lab 12/13/17 2211 12/14/17 0354 12/15/17 0527 12/17/17 0425  AST 51* 46* 50* 46*  ALT 33 33 33 37  ALKPHOS 120 119 124 123  BILITOT 1.8* 2.6* 2.1* 3.1*  PROT 6.0* 5.9* 5.7* 6.1*  ALBUMIN 2.6* 2.6* 2.6* 2.7*   Recent Labs  Lab 12/13/17 2211  LIPASE 50   No results for input(s): AMMONIA in the last 168 hours. Coagulation Profile: Recent Labs  Lab 12/13/17 2211 12/18/17 0824  INR 1.07 1.15   Cardiac Enzymes: Recent Labs  Lab 12/13/17 2211  TROPONINI <0.03   BNP (last 3 results) No results for input(s): PROBNP in the last 8760 hours. HbA1C: No results for input(s): HGBA1C in the last 72 hours. CBG: Recent Labs  Lab 12/18/17 0754 12/18/17 1205 12/18/17 1623 12/18/17 2115 12/19/17 0739  GLUCAP 160* 257* 155* 160* 158*   Lipid Profile: No results for input(s): CHOL, HDL, LDLCALC, TRIG, CHOLHDL, LDLDIRECT in the last 72 hours. Thyroid Function Tests: No results for input(s): TSH, T4TOTAL, FREET4, T3FREE, THYROIDAB in the last 72 hours. Anemia Panel: No results for input(s): VITAMINB12, FOLATE, FERRITIN, TIBC, IRON, RETICCTPCT in the last 72 hours. Urine analysis:    Component Value Date/Time   COLORURINE YELLOW 12/13/2017 2222   APPEARANCEUR CLEAR 12/13/2017 2222   LABSPEC 1.008 12/13/2017 2222   PHURINE 5.0 12/13/2017  2222   GLUCOSEU NEGATIVE 12/13/2017 2222   HGBUR NEGATIVE 12/13/2017 2222   BILIRUBINUR NEGATIVE 12/13/2017 2222   KETONESUR NEGATIVE 12/13/2017 2222   PROTEINUR NEGATIVE 12/13/2017 2222   NITRITE NEGATIVE 12/13/2017 2222   LEUKOCYTESUR NEGATIVE 12/13/2017 2222   Sepsis Labs: @LABRCNTIP (procalcitonin:4,lacticidven:4)  ) Recent Results (from the past 240 hour(s))  Culture, body fluid-bottle     Status: None (Preliminary result)   Collection Time: 12/16/17 11:43 AM  Result Value Ref Range Status   Specimen Description PERITONEAL  Final   Special Requests NONE  Final   Culture   Final    NO GROWTH 3 DAYS Performed at [place] [address], [place], [place] [address]    Report Status PENDING  Incomplete  Gram stain     Status: None   Collection Time: 12/16/17 11:43 AM  Result Value Ref Range Status  Specimen Description PERITONEAL  Final   Special Requests NONE  Final   Gram Stain   Final    WBC PRESENT,BOTH PMN AND MONONUCLEAR NO ORGANISMS SEEN CYTOSPIN SMEAR Performed at [facility], [address], [place], [place] [address]    Report Status 12/16/2017 FINAL  Final      Studies: No results found.  Scheduled Meds: . sodium chloride   Intravenous Once  . feeding supplement (PRO-STAT SUGAR FREE 64)  30 mL Oral BID  . furosemide  40 mg Oral BID  . insulin aspart  0-5 Units Subcutaneous QHS  . insulin aspart  0-9 Units Subcutaneous TID WC  . loratadine  10 mg Oral Daily  . nicotine  21 mg Transdermal Daily  . pantoprazole  40 mg Oral Daily  . polyethylene glycol  17 g Oral Daily  . potassium chloride SA  20 mEq Oral BID  . sodium chloride flush  3 mL Intravenous Q12H    Continuous Infusions: . sodium chloride 250 mL (12/17/17 2219)  . ciprofloxacin 400 mg (12/19/17 1154)     LOS: 4 days     Alma Friendly, MD [facility]  If 7PM-7AM, please contact night-coverage [email or web address] Password [email or web address] 12/19/2017, 3:15 PM

## 2017-12-19 NOTE — Progress Notes (Signed)
PT Cancellation Note  Patient Details Name: Robert Compton MRN: [id number] DOB: 1945-09-14   Cancelled Treatment:     pt feeling "bad" last several days.  No energy.  Weakness.  Fatigue    "most comfortable in bed".  Instructed on importance of mobility however pt cont to decline stating above.    Rica Koyanagi  PTA [facility]  Acute  Rehab Pager      [phone]

## 2017-12-19 NOTE — Progress Notes (Signed)
Initial Nutrition Assessment  DOCUMENTATION CODES:   Obesity unspecified, Non-severe (moderate) malnutrition in context of chronic illness  INTERVENTION:    30 ml Prostat BID, each supplement provides 100 kcals and 15 grams protein.   Liberalize diet  NUTRITION DIAGNOSIS:   Moderate Malnutrition related to chronic illness, cancer and cancer related treatments as evidenced by energy intake < 75% for > or equal to 1 month, mild muscle depletion, edema.  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  Presents this admission with complaints of 10 lbs weight gain and increase in lower extremity edema. Admitted for anasarca likely secondary to severe protein calorie malnutrition.    Pt endorses having a decrease in PO intake for the last several months due to taste change from chemotherapy. States he typically eats two meals a day that consist of: B- grits, sausage biscuit, L- beef stew, soups, sandwich. He does not use protein supplementation at home as he doesn't enjoy the taste. He describes his taste changes as "everything I eat has no taste." Pt does try to eat foods high in protein like fish or chicken, but is inconsistent with intake. Pt consumed cheerios with 2 boiled eggs this morning without complication. Denies any issues with swallowing or nausea. Discussed the importance of protein intake for preservation of lean body mass. Encouraged pt to find supplementation he enjoys while his intake is poor. He is willing to try Prostat this stay and requests for diet to be liberalized so he has more options. RD finds this to be appropriate. Will speak with MD.   Pt endorses a UBW of 310-315 lb the last time  being at that weight "months ago." Weight records reflect a gradual decrease. Suspect pt has lost a significant amount of weight but unable to quantify given fluid accumulation. Nutrition-Focused physical exam completed.   Medications reviewed and include: 40 mg lasix BID, 20 mEq KCl BID Labs reviewed: Na 133 (L)   NUTRITION - FOCUSED PHYSICAL EXAM:    Most Recent Value  Orbital Region  No depletion  Upper Arm Region  Mild depletion  Thoracic and Lumbar Region  Unable to assess  Buccal Region  No depletion  Temple Region  Mild depletion  Clavicle Bone Region  Mild depletion  Clavicle and Acromion Bone Region  No depletion  Scapular Bone Region  Unable to assess  Dorsal Hand  No depletion  Patellar Region  No depletion  Anterior Thigh Region  No depletion  Posterior Calf Region  No depletion  Edema (RD Assessment)  Moderate     Diet Order:   Diet Order            Diet heart healthy/carb modified Room service appropriate? Yes; Fluid consistency: Thin  Diet effective now              EDUCATION NEEDS:   Education needs have been addressed  Skin:  Skin Assessment: Reviewed RN Assessment  Last BM:  12/17/17  Height:   Ht Readings from Last 1 Encounters:  12/14/17 6\' 3"  (1.905 m)    Weight:   Wt Readings from Last 1 Encounters:  12/19/17 121.4 kg    Ideal Body Weight:  89.1 kg  BMI:  Body mass index is 33.46 kg/m.  Estimated Nutritional Needs:   Kcal:  2500-2700 kcal  Protein:  125-140 grams  Fluid:  >/= 2 L/day  Mariana Single RD, LDN Clinical Nutrition Pager # - [phone]

## 2017-12-20 ENCOUNTER — Telehealth: Payer: Self-pay | Admitting: Medical Oncology

## 2017-12-20 DIAGNOSIS — E440 Moderate protein-calorie malnutrition: Secondary | ICD-10-CM

## 2017-12-20 LAB — BPAM RBC
BLOOD PRODUCT EXPIRATION DATE: 201909242359
Blood Product Expiration Date: 201909292359
Blood Product Expiration Date: 201909302359
ISSUE DATE / TIME: 201909021707
ISSUE DATE / TIME: 201909041542
UNIT TYPE AND RH: 6200
Unit Type and Rh: 6200
Unit Type and Rh: 6200

## 2017-12-20 LAB — TYPE AND SCREEN
ABO/RH(D): A POS
Antibody Screen: NEGATIVE
UNIT DIVISION: 0
UNIT DIVISION: 0
Unit division: 0

## 2017-12-20 LAB — CBC WITH DIFFERENTIAL/PLATELET
HCT: 22 % — ABNORMAL LOW (ref 39.0–52.0)
Hemoglobin: 7.7 g/dL — ABNORMAL LOW (ref 13.0–17.0)
MCH: 33 pg (ref 26.0–34.0)
MCHC: 35 g/dL (ref 30.0–36.0)
MCV: 94.4 fL (ref 78.0–100.0)
PLATELETS: 14 10*3/uL — AB (ref 150–400)
RBC: 2.33 MIL/uL — AB (ref 4.22–5.81)
RDW: 16.2 % — ABNORMAL HIGH (ref 11.5–15.5)
WBC: 0.5 10*3/uL — CL (ref 4.0–10.5)

## 2017-12-20 LAB — GLUCOSE, CAPILLARY
GLUCOSE-CAPILLARY: 208 mg/dL — AB (ref 70–99)
GLUCOSE-CAPILLARY: 158 mg/dL — AB (ref 70–99)
GLUCOSE-CAPILLARY: 158 mg/dL — AB (ref 70–99)
GLUCOSE-CAPILLARY: 159 mg/dL — AB (ref 70–99)
Glucose-Capillary: 199 mg/dL — ABNORMAL HIGH (ref 70–99)

## 2017-12-20 LAB — PREPARE PLATELET PHERESIS: UNIT DIVISION: 0

## 2017-12-20 LAB — BPAM PLATELET PHERESIS
Blood Product Expiration Date: 201909072359
ISSUE DATE / TIME: 201909051122
Unit Type and Rh: 6200

## 2017-12-20 MED ORDER — POLYVINYL ALCOHOL 1.4 % OP SOLN
1.0000 [drp] | OPHTHALMIC | Status: DC | PRN
  Administered 2017-12-21 (×2): 1 [drp] via OPHTHALMIC
  Filled 2017-12-20: qty 15

## 2017-12-20 MED ORDER — SODIUM CHLORIDE 0.9% IV SOLUTION
Freq: Once | INTRAVENOUS | Status: AC
  Administered 2017-12-20: 09:00:00 via INTRAVENOUS

## 2017-12-20 MED ORDER — TBO-FILGRASTIM 480 MCG/0.8ML ~~LOC~~ SOSY
480.0000 ug | PREFILLED_SYRINGE | Freq: Every day | SUBCUTANEOUS | Status: AC
  Administered 2017-12-20 – 2017-12-21 (×2): 480 ug via SUBCUTANEOUS
  Filled 2017-12-20 (×2): qty 0.8

## 2017-12-20 NOTE — Plan of Care (Signed)
Pt needs additional blood products.

## 2017-12-20 NOTE — Telephone Encounter (Signed)
Pt still in hospital-Update requested by daughter-given to her .

## 2017-12-20 NOTE — Progress Notes (Signed)
Physical Therapy Treatment Patient Details Name: Robert Compton MRN: [id number] DOB: 12-24-1945 Today's Date: 12/20/2017    History of Present Illness 72 y.o. male with medical history significant of hypertension diabetes type 2, right TKR and active on chemo for metastatic cancer with liver mets admitted for fluid retention in abdomen    PT Comments    Pt with low lab values (HCT, platelets) today, limiting treatment to light seated and supine exercises. Pt tolerated session well, and states that "it feels good to do something". Pt with some dizziness upon sitting on the EOB and complaining of a headache today, BP and HR were 113/70 and 97 bpm, respectively. Goals updated at this time. Will continue to follow acutely and progress mobility as able.   Follow Up Recommendations  Home health PT     Equipment Recommendations  None recommended by PT    Recommendations for Other Services       Precautions / Restrictions Precautions Precautions: Fall Restrictions Weight Bearing Restrictions: No    Mobility  Bed Mobility Overal bed mobility: Needs Assistance Bed Mobility: Supine to Sit;Sit to Supine     Supine to sit: Min guard Sit to supine: Min assist   General bed mobility comments: Bed mobility and light AROM during this session given blood values, particularly HCT and platelets. Pt with min guard for safety supine to sit, min assist for LE management for sit to supine.   Transfers Overall transfer level: (did not perform transfer, given low blood values. Not indicated at this time. )                  Ambulation/Gait                 Stairs             Wheelchair Mobility    Modified Rankin (Stroke Patients Only)       Balance Overall balance assessment: Needs assistance Sitting-balance support: No upper extremity supported;Feet unsupported Sitting balance-Leahy Scale: Good     Standing balance support: (not assessed this session, given  labs)                                Cognition Arousal/Alertness: Awake/alert Behavior During Therapy: WFL for tasks assessed/performed Overall Cognitive Status: Within Functional Limits for tasks assessed                                        Exercises General Exercises - Lower Extremity Long Arc Quad: AROM;Both;10 reps;Seated(all exercises within pt's tolerance, to avoid fatigue and to keep session to light ROM level) Heel Slides: AROM;Both;10 reps;Supine Hip ABduction/ADduction: AROM;Both;10 reps;Supine Hip Flexion/Marching: AROM;Both;10 reps;Supine    General Comments        Pertinent Vitals/Pain Pain Assessment: Faces Faces Pain Scale: Hurts a little bit Pain Location: pain in abdominal region, with mobility  Pain Descriptors / Indicators: Sore;Discomfort Pain Intervention(s): Limited activity within patient's tolerance;Monitored during session;Repositioned    Home Living                      Prior Function            PT Goals (current goals can now be found in the care plan section) Acute Rehab PT Goals PT Goal Formulation: With patient Time For Goal Achievement: 12/27/17 Potential to  Achieve Goals: Fair Progress towards PT goals: Progressing toward goals    Frequency    Min 3X/week      PT Plan Current plan remains appropriate    Co-evaluation              AM-PAC PT "6 Clicks" Daily Activity  Outcome Measure  Difficulty turning over in bed (including adjusting bedclothes, sheets and blankets)?: A Little Difficulty moving from lying on back to sitting on the side of the bed? : Unable Difficulty sitting down on and standing up from a chair with arms (e.g., wheelchair, bedside commode, etc,.)?: A Little Help needed moving to and from a bed to chair (including a wheelchair)?: A Little Help needed walking in hospital room?: A Little Help needed climbing 3-5 steps with a railing? : A Little 6 Click Score:  16    End of Session   Activity Tolerance: Patient tolerated treatment well;Treatment limited secondary to medical complications (Comment)(PT limited session to AROM and bed mobility only given lab values (HCT and platelets)) Patient left: with call bell/phone within reach;in bed;with bed alarm set Nurse Communication: Mobility status PT Visit Diagnosis: Muscle weakness (generalized) (M62.81)     Time: 6553-7482 PT Time Calculation (min) (ACUTE ONLY): 28 min  Charges:  $Therapeutic Exercise: 8-22 mins $Therapeutic Activity: 8-22 mins                     Keylie Beavers Conception Chancy, PT, DPT  Pager # [phone]    Jayleena Stille D Samwise Eckardt 12/20/2017, 12:58 PM

## 2017-12-20 NOTE — Care Management Note (Signed)
Case Management Note  Patient Details  Name: Robert Compton MRN: [id number] Date of Birth: [birth year]  Subjective/Objective:                    Action/Plan:Plan to discharge home with [facility]   Expected Discharge Date:                  Expected Discharge Plan:  [place]  In-House Referral:     Discharge planning Services  CM Consult  Post Acute Care Choice:    Choice offered to:     DME Arranged:    DME Agency:     HH Arranged:  RN, PT, OT HH Agency:  [facility]  Status of Service:  In process, will continue to follow  If discussed at Long Length of Stay Meetings, dates discussed:    Additional CommentsPurcell Mouton, RN 12/20/2017, 9:37 AM

## 2017-12-20 NOTE — Progress Notes (Addendum)
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 Patient was admitted for further management.  Today, pt continues to deny any new complaints. Denies any bleeding, fever/chills, worsening abdominal pain, N/V/D.  Assessment/Plan: Principal Problem:   Edema Active Problems:   DM (diabetes mellitus), type 2 (HCC)   Anemia   Severe protein-calorie malnutrition (HCC)   Abnormal liver function   Leukocytosis   SBP (spontaneous bacterial peritonitis) (HCC)   Abdominal distention   Hypervolemia   Malnutrition of moderate degree  Anasarca likely 2/2 severe protein calorie malnutrition unlikely acute diastolic HF BLE, weight gain on admission, BNP 63.1 CXR showed interstitial opacities likely mild edema  Recent 2d echo with EF of 50-55%, Grade 1 DD Continue PO lasix Continue patient on prostat 30 mL po bid Nutrition on board  Possible SBP with ascites Afebrile, with leukopenia (neutropenic) S/P US guided paracentesis yielding over 2L of fluid Fluid analysis worrisome for SBP Fluid culture NGTD Patient has hx of anaphylaxis to beta lactam. Discussed with pharmacy. Continued on empiric ciprofloxacin for about 7 days total Monitor closely  Abnormal liver function/ascites Likely secondary to metastatic disease Recent abd [place] reviewed. Small to mod ascites noted Daily CMP  Pancytopenia likely due to recent chemotherapy/malignancy  Leukopenia (neutropenic), thrombocytopenia, normocytic anemia DIC panel negative S/P plt transfusion on 12/17/17 and 12/20/17, s/p 1U of PRBC on 12/16/17 and 12/18/17 Spoke to Dr Julien Nordmann (oncology) on 12/18/17, who stated pancytopenia is expected given recent  chemotherapy use. Expects it to improve over time without any intervention Transfuse PLT if <20,000 and PRBC if hgb <8.  Spoke to Dr Julien Nordmann on 12/20/17 and recommended Granix 480 mcg X 2 doses. If ANC is >500 may be safe to discharge Transfuse PLT, PRBC prn Neutropenic precautions  DM type 2 Last A1c 09/2017 was 7.2 SSI, accuchecks Hold home metformin  GERD Cont PPI  Hyperlipidemia Had stopped Red Yeast Rice due to abnormal liver function at presentation  Metastatic SCLC to liver and cervical spine Follows with Oncology Dr Julien Nordmann Dr Julien Nordmann notified of admission  Tobacco abuse Advised to quit Nicotine patch ordered     Code Status: Full  Family Communication: None at bedside  Disposition Plan: To be determined   Consultants:  Oncology  Procedures:  None   Antimicrobials:  Ciprofloxacin  DVT prophylaxis: SCDs   Objective: Vitals:   12/19/17 1147 12/19/17 1418 12/19/17 2046 12/20/17 0611  BP: 122/70 112/75 121/67 121/64  Pulse: 87 90 88 89  Resp: 18 16 20 16   Temp: 98.4 F (36.9 C) 99.1 F (37.3 C) 98.6 F (37 C) 99.7 F (37.6 C)  TempSrc: Oral Oral Oral Oral  SpO2: 93% 97% 97% 93%  Weight:    122.6 kg  Height:        Intake/Output Summary (Last 24 hours) at 12/20/2017 1609 Last data filed at 12/20/2017 1000 Gross per 24 hour  Intake 1156.23 ml  Output 1475 ml  Net -318.77 ml   Filed Weights   12/18/17 0500 12/19/17 0421 12/20/17 0611  Weight: 120.7 kg 121.4 kg 122.6 kg    Exam:   General: NAD, chronically ill appearing  Cardiovascular: S1, S2 present  Respiratory: CTAB  Abdomen: Soft, distended, NT, BS present  Musculoskeletal: No pedal edema bilaterally  Skin: BLE chronic venous skin changes  Psychiatry: Normal mood   Data Reviewed: CBC: Recent Labs  Lab 12/13/17 2211  12/17/17 0425 12/17/17 1542 12/18/17 0427 12/18/17 0824 12/19/17 0413 12/20/17 0403  WBC 23.8*   < > 1.6* 1.4* 0.8*  --  0.6* 0.5*  NEUTROABS  22.8*  --  1.0* 0.8* 0.2*  --  0.0*  --   HGB 8.5*   < > 7.8* 7.3* 7.0*  --  7.9* 7.7*  HCT 25.4*   < > 23.3* 21.0* 20.6*  --  22.8* 22.0*  MCV 100.0   < > 97.1 93.3 97.6  --  95.0 94.4  PLT 82*   < > 13* 9* 17* 25* 10* 14*   < > = values in this interval not displayed.   Basic Metabolic Panel: Recent Labs  Lab 12/15/17 0527 12/16/17 0432 12/17/17 0425 12/18/17 0427 12/19/17 0413  NA 139 136 135 134* 133*  K 3.8 4.0 4.2 4.0 4.0  CL 97* 98 96* 95* 95*  CO2 30 29 29 31 30   GLUCOSE 187* 166* 252* 157* 177*  BUN 20 18 21 20 21   CREATININE 0.76 0.77 0.84 0.82 0.81  CALCIUM 8.1* 8.0* 8.4* 8.3* 8.3*   GFR: Estimated Creatinine Clearance: 119.7 mL/min (by C-G formula based on SCr of 0.81 mg/dL). Liver Function Tests: Recent Labs  Lab 12/13/17 2211 12/14/17 0354 12/15/17 0527 12/17/17 0425  AST 51* 46* 50* 46*  ALT 33 33 33 37  ALKPHOS 120 119 124 123  BILITOT 1.8* 2.6* 2.1* 3.1*  PROT 6.0* 5.9* 5.7* 6.1*  ALBUMIN 2.6* 2.6* 2.6* 2.7*   Recent Labs  Lab 12/13/17 2211  LIPASE 50   No results for input(s): AMMONIA in the last 168 hours. Coagulation Profile: Recent Labs  Lab 12/13/17 2211 12/18/17 0824  INR 1.07 1.15   Cardiac Enzymes: Recent Labs  Lab 12/13/17 2211  TROPONINI <0.03   BNP (last 3 results) No results for input(s): PROBNP in the last 8760 hours. HbA1C: No results for input(s): HGBA1C in the last 72 hours. CBG: Recent Labs  Lab 12/19/17 1149 12/19/17 1655 12/19/17 2041 12/20/17 0805 12/20/17 1306  GLUCAP 181* 220* 208* 158* 199*   Lipid Profile: No results for input(s): CHOL, HDL, LDLCALC, TRIG, CHOLHDL, LDLDIRECT in the last 72 hours. Thyroid Function Tests: No results for input(s): TSH, T4TOTAL, FREET4, T3FREE, THYROIDAB in the last 72 hours. Anemia Panel: No results for input(s): VITAMINB12, FOLATE, FERRITIN, TIBC, IRON, RETICCTPCT in the last 72 hours. Urine analysis:    Component Value Date/Time   COLORURINE YELLOW 12/13/2017  2222   APPEARANCEUR CLEAR 12/13/2017 2222   LABSPEC 1.008 12/13/2017 2222   PHURINE 5.0 12/13/2017 2222   GLUCOSEU NEGATIVE 12/13/2017 2222   HGBUR NEGATIVE 12/13/2017 2222   BILIRUBINUR NEGATIVE 12/13/2017 2222   KETONESUR NEGATIVE 12/13/2017 2222   PROTEINUR NEGATIVE 12/13/2017 2222   NITRITE NEGATIVE 12/13/2017 2222   LEUKOCYTESUR NEGATIVE 12/13/2017 2222   Sepsis Labs: @LABRCNTIP (procalcitonin:4,lacticidven:4)  ) Recent Results (from the past 240 hour(s))  Culture, body fluid-bottle     Status: None (Preliminary result)   Collection Time: 12/16/17 11:43 AM  Result Value Ref Range Status   Specimen Description PERITONEAL  Final   Special Requests NONE  Final   Culture   Final    NO GROWTH 4 DAYS Performed at [facility] [address] [place], [place] [address]    Report Status PENDING  Incomplete  Gram  stain     Status: None   Collection Time: 12/16/17 11:43 AM  Result Value Ref Range Status   Specimen Description PERITONEAL  Final   Special Requests NONE  Final   Gram Stain   Final    WBC PRESENT,BOTH PMN AND MONONUCLEAR NO ORGANISMS SEEN CYTOSPIN SMEAR Performed at [facility], [address], [place], [place] [address]    Report Status 12/16/2017 FINAL  Final      Studies: No results found.  Scheduled Meds: . sodium chloride   Intravenous Once  . feeding supplement (PRO-STAT SUGAR FREE 64)  30 mL Oral BID  . furosemide  40 mg Oral BID  . insulin aspart  0-5 Units Subcutaneous QHS  . insulin aspart  0-9 Units Subcutaneous TID WC  . loratadine  10 mg Oral Daily  . nicotine  21 mg Transdermal Daily  . pantoprazole  40 mg Oral Daily  . polyethylene glycol  17 g Oral Daily  . potassium chloride SA  20 mEq Oral BID  . sodium chloride flush  3 mL Intravenous Q12H    Continuous Infusions: . sodium chloride 250 mL (12/17/17 2219)  . ciprofloxacin 400 mg (12/20/17 0911)     LOS: 5 days     Alma Friendly, MD [facility]  If 7PM-7AM, please contact night-coverage [email or web address] Password [email or web address] 12/20/2017, 4:09 PM

## 2017-12-21 LAB — CBC WITH DIFFERENTIAL/PLATELET
Basophils Absolute: 0 10*3/uL (ref 0.0–0.1)
Basophils Relative: 2 %
EOS PCT: 2 %
Eosinophils Absolute: 0 10*3/uL (ref 0.0–0.7)
HCT: 20.9 % — ABNORMAL LOW (ref 39.0–52.0)
Hemoglobin: 7.4 g/dL — ABNORMAL LOW (ref 13.0–17.0)
Lymphocytes Relative: 77 %
Lymphs Abs: 0.5 10*3/uL — ABNORMAL LOW (ref 0.7–4.0)
MCH: 33.3 pg (ref 26.0–34.0)
MCHC: 35.4 g/dL (ref 30.0–36.0)
MCV: 94.1 fL (ref 78.0–100.0)
MONO ABS: 0 10*3/uL — AB (ref 0.1–1.0)
MONOS PCT: 5 %
NEUTROS ABS: 0.1 10*3/uL — AB (ref 1.7–7.7)
NEUTROS PCT: 14 %
PLATELETS: 19 10*3/uL — AB (ref 150–400)
RBC: 2.22 MIL/uL — ABNORMAL LOW (ref 4.22–5.81)
RDW: 15.9 % — AB (ref 11.5–15.5)
WBC: 0.6 10*3/uL — AB (ref 4.0–10.5)

## 2017-12-21 LAB — GLUCOSE, CAPILLARY
GLUCOSE-CAPILLARY: 307 mg/dL — AB (ref 70–99)
GLUCOSE-CAPILLARY: 238 mg/dL — AB (ref 70–99)
GLUCOSE-CAPILLARY: 255 mg/dL — AB (ref 70–99)
Glucose-Capillary: 170 mg/dL — ABNORMAL HIGH (ref 70–99)

## 2017-12-21 LAB — COMPREHENSIVE METABOLIC PANEL
ALBUMIN: 2.6 g/dL — AB (ref 3.5–5.0)
ALK PHOS: 118 U/L (ref 38–126)
ALT: 35 U/L (ref 0–44)
AST: 38 U/L (ref 15–41)
Anion gap: 10 (ref 5–15)
BUN: 21 mg/dL (ref 8–23)
CALCIUM: 8.5 mg/dL — AB (ref 8.9–10.3)
CHLORIDE: 96 mmol/L — AB (ref 98–111)
CO2: 28 mmol/L (ref 22–32)
CREATININE: 0.78 mg/dL (ref 0.61–1.24)
GFR calc Af Amer: >60 mL/min (ref >60)
GFR calc non Af Amer: >60 mL/min (ref >60)
GLUCOSE: 165 mg/dL — AB (ref 70–99)
Potassium: 3.7 mmol/L (ref 3.5–5.1)
SODIUM: 134 mmol/L — AB (ref 135–145)
Total Bilirubin: 2.5 mg/dL — ABNORMAL HIGH (ref 0.3–1.2)
Total Protein: 6.2 g/dL — ABNORMAL LOW (ref 6.5–8.1)

## 2017-12-21 LAB — CULTURE, BODY FLUID W GRAM STAIN -BOTTLE: Culture: NO GROWTH

## 2017-12-21 LAB — PREPARE RBC (CROSSMATCH)

## 2017-12-21 MED ORDER — ONDANSETRON HCL 4 MG PO TABS
4.0000 mg | ORAL_TABLET | Freq: Three times a day (TID) | ORAL | Status: DC | PRN
  Administered 2017-12-21 – 2017-12-23 (×2): 4 mg via ORAL
  Filled 2017-12-21 (×2): qty 1

## 2017-12-21 MED ORDER — POTASSIUM CHLORIDE 10 MEQ/100ML IV SOLN
10.0000 meq | INTRAVENOUS | Status: AC
  Administered 2017-12-21 (×2): 10 meq via INTRAVENOUS
  Filled 2017-12-21: qty 100

## 2017-12-21 MED ORDER — SODIUM CHLORIDE 0.9% IV SOLUTION
Freq: Once | INTRAVENOUS | Status: AC
  Administered 2017-12-21: 17:00:00 via INTRAVENOUS

## 2017-12-21 MED ORDER — CARVEDILOL 3.125 MG PO TABS
3.1250 mg | ORAL_TABLET | Freq: Two times a day (BID) | ORAL | Status: DC
  Administered 2017-12-21 – 2017-12-24 (×6): 3.125 mg via ORAL
  Filled 2017-12-21 (×7): qty 1

## 2017-12-21 MED ORDER — MAGNESIUM SULFATE 2 GM/50ML IV SOLN
2.0000 g | Freq: Once | INTRAVENOUS | Status: AC
  Administered 2017-12-21: 2 g via INTRAVENOUS
  Filled 2017-12-21: qty 50

## 2017-12-21 MED ORDER — ONDANSETRON HCL 4 MG/2ML IJ SOLN: 4.0000 mg | Freq: Four times a day (QID) | INTRAMUSCULAR | Status: DC | PRN

## 2017-12-21 NOTE — Progress Notes (Signed)
Patient had non-sustained SVT in the 140, patient denies any distress, vital-125/85, HR-96. On call   Bodenheimer-NP notified, no new order. Will continue to monitor patient.

## 2017-12-21 NOTE — Progress Notes (Signed)
PROGRESS NOTE    Robert Compton  YQM:[id number] DOB: Oct 03, 1945 DOA: 12/13/2017 PCP: [name], MD    Brief Narrative:Robert Compton  is a 72 y.o. 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    In Ed,  CTA chest IMPRESSION: 1. Suboptimal opacification of the pulmonary arteries. No central or lobar pulmonary embolism. 2. Slight interval increase in size of small right pleural effusion. Unchanged small left pleural effusion. 3. Unchanged right apical lung cancer. Stable mediastinal adenopathy and hepatic metastases. 4. Nodular liver contour, suggestive of cirrhosis. Unchanged small ascites. 5. Aortic atherosclerosis (ICD10-I70.0).  Na 136, K 4.4, Bun 14, Creatinine 0.94 Alb 2.4  Ast 47, Alt 33, Alk phos 139, T. b ili 2.8  Pt will be admitted for dyspnea secondary to symptomatic anemia, and severe protein calorie malnutrition  Assessment & Plan:   Principal Problem:   Edema Active Problems:   DM (diabetes mellitus), type 2 (HCC)   Anemia   Severe protein-calorie malnutrition (HCC)   Abnormal liver function   Leukocytosis   SBP (spontaneous bacterial peritonitis) (HCC)   Abdominal distention   Hypervolemia   Malnutrition of moderate degree  Anasarca likely 2/2 severe protein calorie malnutrition unlikely acute diastolic HF BLE, weight gain on admission, BNP 63.1 CXR showed interstitial opacities likely mild edema  Recent 2d echo with EF of 50-55%, Grade 1 DD Continue PO lasix Continue patient on prostat 30 mL po bid Nutrition on board  Possible SBP with ascites Afebrile, with leukopenia (neutropenic) S/P US guided paracentesis yielding over 2L of fluid Fluid analysis worrisome for SBP Fluid culture NGTD Patient has hx of  anaphylaxis to beta lactam. Discussed with pharmacy.Continued onempiric ciprofloxacin for about 7 days total Monitor closely  Abnormal liver function/ascites Likely secondary to metastatic disease Recent abd [place] reviewed. Small to mod ascites noted Daily CMP Pancytopenia likely due to recent chemotherapy/malignancy  Leukopenia (neutropenic), thrombocytopenia, normocytic anemia DIC panel negative S/P plt transfusion on 12/17/17 and 12/20/17, s/p 1U of PRBC on 12/16/17 and 12/18/17.WILL transfuse platelets again today down to 19. Transfuse prbc to keep hb above 8 today.  Spoke to Dr Julien Nordmann on 12/20/17 and recommended Granix 480 mcg X 2 doses. If ANC is >500 may be safe to discharge  DM type 2 Last A1c 09/2017 was 7.2 SSI, accuchecks Hold home metformin  GERD Cont PPI  Hyperlipidemia Had stopped Red Yeast Rice due to abnormal liver function at presentation  Metastatic SCLC to liver and cervical spine Follows with Oncology Dr Julien Nordmann Dr Julien Nordmann notified of admission  Tobacco abuse Advised to quit Nicotine patch ordered  GERD Cont PPI  NSVT TODAY-replete k.check mag.heart rate high 90s.will start low dose beta blocker.  DVT prophylaxis:scd Code Status:full Family Communication:none Disposition Plan: tbd  Consultants: none  Procedures:paracentesis Antimicrobials cipro Subjective: Resting in bed complains of abdominal distention.  He denies any chest pain.  Complains of shortness of breath and dyspnea on exertion.   Objective: Vitals:   12/20/17 1800 12/20/17 1957 12/20/17 2123 12/21/17 0545  BP: 125/65 124/68 129/75 135/62  Pulse: 95 90 94 (!) 101  Resp: 17 16 20 20   Temp: 98.4 F (36.9 C) 98.5 F (36.9 C) 99.5 F (37.5 C) 98.6 F (37 C)  TempSrc: Oral Oral Oral Oral  SpO2: 98% 98% 96% 98%  Weight:  122.1 kg  Height:        Intake/Output Summary (Last 24 hours) at 12/21/2017 1121 Last data filed at 12/21/2017 0915 Gross per 24 hour  Intake 1457.83 ml    Output 1950 ml  Net -492.17 ml   Filed Weights   12/19/17 0421 12/20/17 0611 12/21/17 0545  Weight: 121.4 kg 122.6 kg 122.1 kg    Examination multiple superficial bruises noted on skin  General exam: Appears calm and comfortable  Respiratory system: Clear to auscultation. Respiratory effort normal. Cardiovascular system: S1 & S2 heard, RRR. No JVD, murmurs, rubs, gallops or clicks. No pedal edema. Gastrointestinal system: Abdomen is DISTENDED soft and nontender. No organomegaly or masses felt. Normal bowel sounds heard. Central nervous system: Alert and oriented. No focal neurological deficits. Extremities: Symmetric 5 x 5 power. Skin: No rashes, lesions or ulcers Psychiatry: Judgement and insight appear normal. Mood & affect appropriate.     Data Reviewed: I have personally reviewed following labs and imaging studies  CBC: Recent Labs  Lab 12/17/17 0425 12/17/17 1542 12/18/17 0427 12/18/17 0824 12/19/17 0413 12/20/17 0403 12/21/17 0457  WBC 1.6* 1.4* 0.8*  --  0.6* 0.5* 0.6*  NEUTROABS 1.0* 0.8* 0.2*  --  0.0*  --  0.1*  HGB 7.8* 7.3* 7.0*  --  7.9* 7.7* 7.4*  HCT 23.3* 21.0* 20.6*  --  22.8* 22.0* 20.9*  MCV 97.1 93.3 97.6  --  95.0 94.4 94.1  PLT 13* 9* 17* 25* 10* 14* 19*   Basic Metabolic Panel: Recent Labs  Lab 12/16/17 0432 12/17/17 0425 12/18/17 0427 12/19/17 0413 12/21/17 0643  NA 136 135 134* 133* 134*  K 4.0 4.2 4.0 4.0 3.7  CL 98 96* 95* 95* 96*  CO2 29 29 31 30 28   GLUCOSE 166* 252* 157* 177* 165*  BUN 18 21 20 21 21   CREATININE 0.77 0.84 0.82 0.81 0.78  CALCIUM 8.0* 8.4* 8.3* 8.3* 8.5*   GFR: Estimated Creatinine Clearance: 120.9 mL/min (by C-G formula based on SCr of 0.78 mg/dL). Liver Function Tests: Recent Labs  Lab 12/15/17 0527 12/17/17 0425 12/21/17 0643  AST 50* 46* 38  ALT 33 37 35  ALKPHOS 124 123 118  BILITOT 2.1* 3.1* 2.5*  PROT 5.7* 6.1* 6.2*  ALBUMIN 2.6* 2.7* 2.6*   No results for input(s): LIPASE, AMYLASE in the  last 168 hours. No results for input(s): AMMONIA in the last 168 hours. Coagulation Profile: Recent Labs  Lab 12/18/17 0824  INR 1.15   Cardiac Enzymes: No results for input(s): CKTOTAL, CKMB, CKMBINDEX, TROPONINI in the last 168 hours. BNP (last 3 results) No results for input(s): PROBNP in the last 8760 hours. HbA1C: No results for input(s): HGBA1C in the last 72 hours. CBG: Recent Labs  Lab 12/20/17 0805 12/20/17 1306 12/20/17 1742 12/20/17 2114 12/21/17 0705  GLUCAP 158* 199* 158* 159* 170*   Lipid Profile: No results for input(s): CHOL, HDL, LDLCALC, TRIG, CHOLHDL, LDLDIRECT in the last 72 hours. Thyroid Function Tests: No results for input(s): TSH, T4TOTAL, FREET4, T3FREE, THYROIDAB in the last 72 hours. Anemia Panel: No results for input(s): VITAMINB12, FOLATE, FERRITIN, TIBC, IRON, RETICCTPCT in the last 72 hours. Sepsis Labs: No results for input(s): PROCALCITON, LATICACIDVEN in the last 168 hours.  Recent Results (from the past 240 hour(s))  Culture, body fluid-bottle     Status: None   Collection Time: 12/16/17 11:43 AM  Result Value Ref Range Status   Specimen Description PERITONEAL  Final   Special Requests NONE  Final   Culture   Final    NO GROWTH 5 DAYS Performed at [facility], [place] [address] [place], [place] [address]    Report Status 12/21/2017 FINAL  Final  Gram stain     Status: None   Collection Time: 12/16/17 11:43 AM  Result Value Ref Range Status   Specimen Description PERITONEAL  Final   Special Requests NONE  Final   Gram Stain   Final    WBC PRESENT,BOTH PMN AND MONONUCLEAR NO ORGANISMS SEEN CYTOSPIN SMEAR Performed at [facility], [place] [address] [place], [place] [address]    Report Status 12/16/2017 FINAL  Final         Radiology Studies: No results found.      Scheduled Meds: . feeding supplement (PRO-STAT SUGAR FREE 64)  30 mL Oral BID  . furosemide  40 mg Oral BID  . insulin aspart  0-5 Units  Subcutaneous QHS  . insulin aspart  0-9 Units Subcutaneous TID WC  . loratadine  10 mg Oral Daily  . nicotine  21 mg Transdermal Daily  . pantoprazole  40 mg Oral Daily  . polyethylene glycol  17 g Oral Daily  . potassium chloride SA  20 mEq Oral BID  . sodium chloride flush  3 mL Intravenous Q12H  . Tbo-filgastrim (GRANIX) SQ  480 mcg Subcutaneous q1800   Continuous Infusions: . sodium chloride Stopped (12/20/17 2257)  . ciprofloxacin 400 mg (12/21/17 0915)     LOS: 6 days     Georgette Shell, MD [facility]  If 7PM-7AM, please contact night-coverage [email or web address] Password [email or web address] 12/21/2017, 11:21 AM

## 2017-12-22 LAB — CBC WITH DIFFERENTIAL/PLATELET
BASOS ABS: 0 10*3/uL (ref 0.0–0.1)
Basophils Relative: 1 %
Eosinophils Absolute: 0.1 10*3/uL (ref 0.0–0.7)
Eosinophils Relative: 5 %
HCT: 23.2 % — ABNORMAL LOW (ref 39.0–52.0)
HEMOGLOBIN: 8.1 g/dL — AB (ref 13.0–17.0)
LYMPHS ABS: 0.5 10*3/uL — AB (ref 0.7–4.0)
Lymphocytes Relative: 59 %
MCH: 32.8 pg (ref 26.0–34.0)
MCHC: 34.9 g/dL (ref 30.0–36.0)
MCV: 93.9 fL (ref 78.0–100.0)
Monocytes Absolute: 0.1 10*3/uL (ref 0.1–1.0)
Monocytes Relative: 8 %
NEUTROS PCT: 27 %
Neutro Abs: 0.3 10*3/uL — ABNORMAL LOW (ref 1.7–7.7)
Platelets: 20 10*3/uL — CL (ref 150–400)
RBC: 2.47 MIL/uL — AB (ref 4.22–5.81)
RDW: 15.4 % (ref 11.5–15.5)
WBC: 1 10*3/uL — AB (ref 4.0–10.5)

## 2017-12-22 LAB — GLUCOSE, CAPILLARY
GLUCOSE-CAPILLARY: 204 mg/dL — AB (ref 70–99)
Glucose-Capillary: 160 mg/dL — ABNORMAL HIGH (ref 70–99)
Glucose-Capillary: 195 mg/dL — ABNORMAL HIGH (ref 70–99)
Glucose-Capillary: 227 mg/dL — ABNORMAL HIGH (ref 70–99)

## 2017-12-22 LAB — BPAM PLATELET PHERESIS
Blood Product Expiration Date: 201909072359
Blood Product Expiration Date: 201909082359
ISSUE DATE / TIME: 201909061551
ISSUE DATE / TIME: 201909071620
UNIT TYPE AND RH: 5100
Unit Type and Rh: 5100

## 2017-12-22 LAB — COMPREHENSIVE METABOLIC PANEL
ALBUMIN: 2.5 g/dL — AB (ref 3.5–5.0)
ALT: 34 U/L (ref 0–44)
AST: 33 U/L (ref 15–41)
Alkaline Phosphatase: 109 U/L (ref 38–126)
Anion gap: 8 (ref 5–15)
BUN: 15 mg/dL (ref 8–23)
CO2: 29 mmol/L (ref 22–32)
Calcium: 8.2 mg/dL — ABNORMAL LOW (ref 8.9–10.3)
Chloride: 97 mmol/L — ABNORMAL LOW (ref 98–111)
Creatinine, Ser: 0.68 mg/dL (ref 0.61–1.24)
GFR calc Af Amer: >60 mL/min (ref >60)
GFR calc non Af Amer: >60 mL/min (ref >60)
Glucose, Bld: 209 mg/dL — ABNORMAL HIGH (ref 70–99)
POTASSIUM: 3.6 mmol/L (ref 3.5–5.1)
SODIUM: 134 mmol/L — AB (ref 135–145)
Total Bilirubin: 2.5 mg/dL — ABNORMAL HIGH (ref 0.3–1.2)
Total Protein: 5.8 g/dL — ABNORMAL LOW (ref 6.5–8.1)

## 2017-12-22 LAB — TYPE AND SCREEN
ABO/RH(D): A POS
Antibody Screen: NEGATIVE
UNIT DIVISION: 0

## 2017-12-22 LAB — BPAM RBC
BLOOD PRODUCT EXPIRATION DATE: 201910012359
ISSUE DATE / TIME: 201909071753
Unit Type and Rh: 6200

## 2017-12-22 LAB — PREPARE PLATELET PHERESIS
UNIT DIVISION: 0
Unit division: 0

## 2017-12-22 LAB — MAGNESIUM: Magnesium: 1.5 mg/dL — ABNORMAL LOW (ref 1.7–2.4)

## 2017-12-22 MED ORDER — MAGNESIUM SULFATE 2 GM/50ML IV SOLN
2.0000 g | Freq: Once | INTRAVENOUS | Status: AC
  Administered 2017-12-22: 2 g via INTRAVENOUS
  Filled 2017-12-22: qty 50

## 2017-12-22 MED ORDER — POTASSIUM CHLORIDE CRYS ER 20 MEQ PO TBCR
40.0000 meq | EXTENDED_RELEASE_TABLET | Freq: Once | ORAL | Status: AC
  Administered 2017-12-22: 40 meq via ORAL
  Filled 2017-12-22: qty 2

## 2017-12-22 NOTE — Progress Notes (Signed)
Assumed care from prior RN, agree with assessment.  Patient without complaint.  Will continue to monitor.

## 2017-12-22 NOTE — Progress Notes (Addendum)
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  In Ed,  CTA chest IMPRESSION: 1. Suboptimal opacification of the pulmonary arteries. No central or lobar pulmonary embolism. 2. Slight interval increase in size of small right pleural effusion. Unchanged small left pleural effusion. 3. Unchanged right apical lung cancer. Stable mediastinal adenopathy and hepatic metastases. 4. Nodular liver contour, suggestive of cirrhosis. Unchanged small ascites. 5. Aortic atherosclerosis (ICD10-I70.0).  Na 136, K 4.4, Bun 14, Creatinine 0.94 Alb 2.4  Ast 47, Alt 33, Alk phos 139, T. b ili 2.8  Pt will be admitted for dyspnea secondary to symptomatic anemia, and severe protein calorie malnutrition  Assessment & Plan:   Principal Problem:   Edema Active Problems:   DM (diabetes mellitus), type 2 (HCC)   Anemia   Severe protein-calorie malnutrition (HCC)   Abnormal liver function   Leukocytosis   SBP (spontaneous bacterial peritonitis) (HCC)   Abdominal distention   Hypervolemia   Malnutrition of moderate degree   Anasarca likely 2/2 severe protein calorie malnutrition unlikely acute diastolic HF BLE, weight gain on admission, BNP 63.1 CXR showed interstitial opacities likely mild edema  Recent 2d echo with EF of 50-55%, Grade 1 DD Continue PO lasix Continue patient on prostat 30 mL po bid Nutrition on board  Possible SBP with ascites Afebrile, with leukopenia (neutropenic) S/PUS guided paracentesis yielding over 2L of fluid Fluid analysis worrisome for SBP Fluid culture NGTD Patient has hx of anaphylaxis to beta lactam.  Discussed with pharmacy.Continued onempiric ciprofloxacinfor about 7 days total Monitor closely  Abnormal liver function/ascites Likely secondary to metastatic disease Recent abd [place] reviewed. Small to mod ascites noted Daily CMP Pancytopenia likely due to recent chemotherapy/malignancy  Leukopenia (neutropenic), thrombocytopenia, normocytic anemia DIC panel negative S/P plt transfusion on 9/3/19and 12/20/17, s/p 1U ofPRBC on 12/16/17 and 12/18/17.received a unit of platelet transfusion and 1 unit of packed RBC again on 12/21/2017 Spoke to Dr Julien Nordmann on 12/20/17 and recommended Granix 480 mcg X 2 doses. If ANC is >500 may be safe to discharge DM type 2 Last A1c 09/2017 was 7.2 SSI, accuchecks Hold home metformin  GERD Cont PPI  Hyperlipidemia Had stopped Red Yeast Rice due to abnormal liver function at presentation  Metastatic SCLC to liver and cervical spine Follows with Oncology Dr Julien Nordmann Dr Julien Nordmann notified of admission  Tobacco abuse Advised to quit Nicotine patch ordered  GERD Cont PPI  NSVT TODAY-replete k.check mag.heart rate high 90s.will start low dose beta blocker.  Swelling on the left side of his abdomen-the area appears edematous and pitting with no erythema watch for cellulitis advised him to sleep on his right side.  DVT prophylaxis scd Code Status: full Family Communication:none Disposition Plan: DC home once medically stable, the only thing that is keeping him in the hospital is his pancytopenia.  His platelet count is 1:20 unit of transfusion yesterday touch base with oncologist tomorrow regarding DC plans.  Consultants: None  Procedures: Paracentesis Antimicrobials ciprofloxacin  Subjective: Patient awake alert sitting up in bed denies any nausea or vomiting today asking for food to eat he was put on clear liquid diet since he was nauseous yesterday.  He is complaining of pain in the left side of  his abdomen.   Objective: Vitals:   12/21/17  1815 12/21/17 2055 12/22/17 0519 12/22/17 1403  BP: (!) 123/57 105/87 122/64 130/76  Pulse: 86 87 92 89  Resp: 20 20 18  (!) 22  Temp: 98.3 F (36.8 C) 98 F (36.7 C) 98.1 F (36.7 C) 98.4 F (36.9 C)  TempSrc: Oral Oral Oral Oral  SpO2: 100% 97% 96% 94%  Weight:   123.5 kg   Height:        Intake/Output Summary (Last 24 hours) at 12/22/2017 1429 Last data filed at 12/22/2017 1339 Gross per 24 hour  Intake 2531.42 ml  Output 1201 ml  Net 1330.42 ml   Filed Weights   12/20/17 0611 12/21/17 0545 12/22/17 0519  Weight: 122.6 kg 122.1 kg 123.5 kg    Examination:  General exam: Appears calm and comfortable  Respiratory system: Clear to auscultation. Respiratory effort normal. Cardiovascular system: S1 & S2 heard, RRR. No JVD, murmurs, rubs, gallops or clicks. No pedal edema. Gastrointestinal system: Abdomen is nondistended, soft and nontender. No organomegaly or masses felt. Normal bowel sounds heard.Left abdominal area edema pitting. Central nervous system: Alert and oriented. No focal neurological deficits. Extremities: Symmetric 5 x 5 power. Skin: No rashes, lesions or ulcers Psychiatry: Judgement and insight appear normal. Mood & affect appropriate.     Data Reviewed: I have personally reviewed following labs and imaging studies  CBC: Recent Labs  Lab 12/17/17 1542 12/18/17 0427 12/18/17 0824 12/19/17 0413 12/20/17 0403 12/21/17 0457 12/22/17 0413  WBC 1.4* 0.8*  --  0.6* 0.5* 0.6* 1.0*  NEUTROABS 0.8* 0.2*  --  0.0*  --  0.1* 0.3*  HGB 7.3* 7.0*  --  7.9* 7.7* 7.4* 8.1*  HCT 21.0* 20.6*  --  22.8* 22.0* 20.9* 23.2*  MCV 93.3 97.6  --  95.0 94.4 94.1 93.9  PLT 9* 17* 25* 10* 14* 19* 20*   Basic Metabolic Panel: Recent Labs  Lab 12/17/17 0425 12/18/17 0427 12/19/17 0413 12/21/17 0643 12/22/17 0413  NA 135 134* 133* 134* 134*  K 4.2 4.0 4.0 3.7 3.6  CL 96* 95* 95* 96* 97*  CO2 29 31 30 28 29   GLUCOSE 252* 157* 177* 165* 209*  BUN 21 20 21 21 15     CREATININE 0.84 0.82 0.81 0.78 0.68  CALCIUM 8.4* 8.3* 8.3* 8.5* 8.2*  MG  --   --   --   --  1.5*   GFR: Estimated Creatinine Clearance: 121.6 mL/min (by C-G formula based on SCr of 0.68 mg/dL). Liver Function Tests: Recent Labs  Lab 12/17/17 0425 12/21/17 0643 12/22/17 0413  AST 46* 38 33  ALT 37 35 34  ALKPHOS 123 118 109  BILITOT 3.1* 2.5* 2.5*  PROT 6.1* 6.2* 5.8*  ALBUMIN 2.7* 2.6* 2.5*   No results for input(s): LIPASE, AMYLASE in the last 168 hours. No results for input(s): AMMONIA in the last 168 hours. Coagulation Profile: Recent Labs  Lab 12/18/17 0824  INR 1.15   Cardiac Enzymes: No results for input(s): CKTOTAL, CKMB, CKMBINDEX, TROPONINI in the last 168 hours. BNP (last 3 results) No results for input(s): PROBNP in the last 8760 hours. HbA1C: No results for input(s): HGBA1C in the last 72 hours. CBG: Recent Labs  Lab 12/21/17 1223 12/21/17 1642 12/21/17 2115 12/22/17 0723 12/22/17 1150  GLUCAP 307* 238* 255* 160* 195*   Lipid Profile: No results for input(s): CHOL, HDL, LDLCALC, TRIG, CHOLHDL, LDLDIRECT in the last 72 hours. Thyroid Function Tests: No results for input(s):  TSH, T4TOTAL, FREET4, T3FREE, THYROIDAB in the last 72 hours. Anemia Panel: No results for input(s): VITAMINB12, FOLATE, FERRITIN, TIBC, IRON, RETICCTPCT in the last 72 hours. Sepsis Labs: No results for input(s): PROCALCITON, LATICACIDVEN in the last 168 hours.  Recent Results (from the past 240 hour(s))  Culture, body fluid-bottle     Status: None   Collection Time: 12/16/17 11:43 AM  Result Value Ref Range Status   Specimen Description PERITONEAL  Final   Special Requests NONE  Final   Culture   Final    NO GROWTH 5 DAYS Performed at [facility], [address] [place], [place] [address]    Report Status 12/21/2017 FINAL  Final  Gram stain     Status: None   Collection Time: 12/16/17 11:43 AM  Result Value Ref Range Status   Specimen Description PERITONEAL   Final   Special Requests NONE  Final   Gram Stain   Final    WBC PRESENT,BOTH PMN AND MONONUCLEAR NO ORGANISMS SEEN CYTOSPIN SMEAR Performed at [facility], [place] [address], [place], [place] [address]    Report Status 12/16/2017 FINAL  Final         Radiology Studies: No results found.      Scheduled Meds: . carvedilol  3.125 mg Oral BID WC  . feeding supplement (PRO-STAT SUGAR FREE 64)  30 mL Oral BID  . furosemide  40 mg Oral BID  . insulin aspart  0-5 Units Subcutaneous QHS  . insulin aspart  0-9 Units Subcutaneous TID WC  . loratadine  10 mg Oral Daily  . nicotine  21 mg Transdermal Daily  . pantoprazole  40 mg Oral Daily  . polyethylene glycol  17 g Oral Daily  . potassium chloride SA  20 mEq Oral BID  . sodium chloride flush  3 mL Intravenous Q12H   Continuous Infusions: . sodium chloride Stopped (12/20/17 2104)  . ciprofloxacin 400 mg (12/22/17 0921)     LOS: 7 days    Georgette Shell, MD [facility]  If 7PM-7AM, please contact night-coverage [email or web address] Password [facility] 12/22/2017, 2:29 PM

## 2017-12-23 ENCOUNTER — Inpatient Hospital Stay (HOSPITAL_COMMUNITY): Payer: BLUE CROSS/BLUE SHIELD

## 2017-12-23 ENCOUNTER — Inpatient Hospital Stay: Payer: BLUE CROSS/BLUE SHIELD

## 2017-12-23 DIAGNOSIS — D61818 Other pancytopenia: Secondary | ICD-10-CM

## 2017-12-23 DIAGNOSIS — E43 Unspecified severe protein-calorie malnutrition: Secondary | ICD-10-CM

## 2017-12-23 DIAGNOSIS — E119 Type 2 diabetes mellitus without complications: Secondary | ICD-10-CM

## 2017-12-23 DIAGNOSIS — E8770 Fluid overload, unspecified: Secondary | ICD-10-CM

## 2017-12-23 DIAGNOSIS — R945 Abnormal results of liver function studies: Secondary | ICD-10-CM

## 2017-12-23 DIAGNOSIS — R140 Abdominal distension (gaseous): Secondary | ICD-10-CM

## 2017-12-23 LAB — CBC WITH DIFFERENTIAL/PLATELET
BASOS ABS: 0 10*3/uL (ref 0.0–0.1)
Basophils Relative: 1 %
EOS ABS: 0.1 10*3/uL (ref 0.0–0.7)
Eosinophils Relative: 4 %
HCT: 23.2 % — ABNORMAL LOW (ref 39.0–52.0)
HEMOGLOBIN: 8.1 g/dL — AB (ref 13.0–17.0)
LYMPHS PCT: 45 %
Lymphs Abs: 0.7 10*3/uL (ref 0.7–4.0)
MCH: 33.1 pg (ref 26.0–34.0)
MCHC: 34.9 g/dL (ref 30.0–36.0)
MCV: 94.7 fL (ref 78.0–100.0)
Monocytes Absolute: 0.2 10*3/uL (ref 0.1–1.0)
Monocytes Relative: 10 %
NEUTROS ABS: 0.6 10*3/uL — AB (ref 1.7–7.7)
NEUTROS PCT: 40 %
Platelets: 13 10*3/uL — CL (ref 150–400)
RBC: 2.45 MIL/uL — AB (ref 4.22–5.81)
RDW: 15.5 % (ref 11.5–15.5)
WBC: 1.6 10*3/uL — AB (ref 4.0–10.5)

## 2017-12-23 LAB — COMPREHENSIVE METABOLIC PANEL
ALBUMIN: 2.4 g/dL — AB (ref 3.5–5.0)
ALT: 32 U/L (ref 0–44)
ANION GAP: 9 (ref 5–15)
AST: 29 U/L (ref 15–41)
Alkaline Phosphatase: 108 U/L (ref 38–126)
BUN: 16 mg/dL (ref 8–23)
CALCIUM: 8.4 mg/dL — AB (ref 8.9–10.3)
CO2: 28 mmol/L (ref 22–32)
Chloride: 97 mmol/L — ABNORMAL LOW (ref 98–111)
Creatinine, Ser: 0.82 mg/dL (ref 0.61–1.24)
GFR calc Af Amer: >60 mL/min (ref >60)
GFR calc non Af Amer: >60 mL/min (ref >60)
GLUCOSE: 165 mg/dL — AB (ref 70–99)
POTASSIUM: 4 mmol/L (ref 3.5–5.1)
SODIUM: 134 mmol/L — AB (ref 135–145)
Total Bilirubin: 1.8 mg/dL — ABNORMAL HIGH (ref 0.3–1.2)
Total Protein: 5.5 g/dL — ABNORMAL LOW (ref 6.5–8.1)

## 2017-12-23 LAB — MAGNESIUM: Magnesium: 1.6 mg/dL — ABNORMAL LOW (ref 1.7–2.4)

## 2017-12-23 LAB — GLUCOSE, CAPILLARY
GLUCOSE-CAPILLARY: 166 mg/dL — AB (ref 70–99)
Glucose-Capillary: 172 mg/dL — ABNORMAL HIGH (ref 70–99)
Glucose-Capillary: 220 mg/dL — ABNORMAL HIGH (ref 70–99)
Glucose-Capillary: 184 mg/dL — ABNORMAL HIGH (ref 70–99)

## 2017-12-23 MED ORDER — LIDOCAINE HCL 1 % IJ SOLN
INTRAMUSCULAR | Status: AC
  Filled 2017-12-23: qty 10

## 2017-12-23 MED ORDER — MAGNESIUM SULFATE 2 GM/50ML IV SOLN
2.0000 g | Freq: Once | INTRAVENOUS | Status: AC
  Administered 2017-12-23: 2 g via INTRAVENOUS
  Filled 2017-12-23: qty 50

## 2017-12-23 MED ORDER — SODIUM CHLORIDE 0.9% IV SOLUTION: Freq: Once | INTRAVENOUS | Status: DC

## 2017-12-23 NOTE — Progress Notes (Signed)
PT Cancellation Note  Patient Details Name: Robert Compton MRN: [id number] DOB: 21-Mar-1946   Cancelled Treatment:    Reason Eval/Treat Not Completed: Patient declined, no reason specified Pt reports "today is worse then yesterday"  Pt states he has paracentesis later and to also get platelets today.  Pt declines to participate at this time.   Chassidy Layson,KATHrine E 12/23/2017, 10:09 AM Carmelia Bake, PT, DPT 12/23/2017 Pager: [phone]

## 2017-12-23 NOTE — Progress Notes (Signed)
PROGRESS NOTE    Robert Compton  [facility]:[id number] DOB: [birth year] DOA: 12/13/2017 PCP: [name], MD   Brief Narrative: Robert Compton is a 72 y.o. 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 He presented with anasarca. He has been diuresis and provided blood transfusions for anemia and thrombocytopenia. He also had leukopenia requiring Granix, which has improved.   Assessment & Plan:   Principal Problem:   Edema Active Problems:   DM (diabetes mellitus), type 2 (HCC)   Anemia   Severe protein-calorie malnutrition (HCC)   Abnormal liver function   Leukocytosis   SBP (spontaneous bacterial peritonitis) (HCC)   Abdominal distention   Hypervolemia   Malnutrition of moderate degree   Anasarca Secondary to hypoalbuminemia and fluid overload from liver dysfunction. Patient's edema is moving more central secondary to positioning in the bed.Treated with Lasix. Dietitian consulted. -Continue Lasix  Concern for SBP Does not appear patient had symptoms concerning for SBP. Gram stain and culture negative.Treated empirically with ciprofloxacin for 7 days. -Discontinue ciprofloxacin  Abnormal LFTs Ascites Secondary to metastatic disease to the liver. He is s/p paracentesis on 9/2. Patient complaining of worsening abdominal distension and pain today -US paracentesis if able  Pancytopenia In setting of chemotherapy secondary to malignancy. Received two doses of Granix. ANC has improved to 600 today. S/p 3 units of PRBC and 4 units of platelets  Diabetes mellitus, type 2 -Continue SSI  GERD -Continue Protonix   DVT prophylaxis: SCDs Code Status:   Code Status: Full Code Family Communication: None at bedside Disposition Plan: Discharge pending platelet transfusion, medical oncology recommendations   Consultants:   Medical oncology  Interventional radiology  Procedures:   Paracentesis  (12/16/17)  Antimicrobials:  Ciprofloxacin    Subjective: Some lower abdominal discomfort. Worried about the swelling in his abdomen.  Objective: Vitals:   12/22/17 1403 12/22/17 2105 12/23/17 0454 12/23/17 0609  BP: 130/76 107/62 132/71   Pulse: 89 89 (!) 104   Resp: (!) 22 18 18    Temp: 98.4 F (36.9 C) 97.8 F (36.6 C) 98.5 F (36.9 C)   TempSrc: Oral Oral Oral   SpO2: 94% 92% 92%   Weight:    123 kg  Height:        Intake/Output Summary (Last 24 hours) at 12/23/2017 0953 Last data filed at 12/23/2017 0813 Gross per 24 hour  Intake 930 ml  Output 1475 ml  Net -545 ml   Filed Weights   12/21/17 0545 12/22/17 0519 12/23/17 0609  Weight: 122.1 kg 123.5 kg 123 kg    Examination:  General exam: Appears calm and comfortable Respiratory system: Clear to auscultation. Respiratory effort normal. Cardiovascular system: S1 & S2 heard, RRR. No murmurs Gastrointestinal system: Abdomen is distended, soft and mildly tender in RUQ. Normal bowel sounds heard. Edema of abdominal wall Central nervous system: Alert and oriented. No focal neurological deficits. Extremities: Trace bilateral LE edema. No calf tenderness Skin: No cyanosis. Chronic dermatitis of his bilateral legs Psychiatry: Judgement and insight appear normal. Mood & affect appropriate.     Data Reviewed: I have personally reviewed following labs and imaging studies  CBC: Recent Labs  Lab 12/18/17 0427  12/19/17 0413 12/20/17 0403 12/21/17 0457 12/22/17 0413 12/23/17 0431  WBC 0.8*  --  0.6* 0.5* 0.6* 1.0* 1.6*  NEUTROABS 0.2*  --  0.0*  --  0.1* 0.3* 0.6*  HGB 7.0*  --  7.9* 7.7* 7.4* 8.1* 8.1*  HCT 20.6*  --  22.8* 22.0* 20.9* 23.2* 23.2*  MCV 97.6  --  95.0 94.4 94.1 93.9 94.7  PLT 17*   < > 10* 14* 19* 20* 13*   < > = values in this interval not displayed.   Basic Metabolic Panel: Recent Labs  Lab 12/18/17 0427 12/19/17 0413 12/21/17 0643 12/22/17 0413 12/23/17 0431  NA 134* 133* 134* 134* 134*   K 4.0 4.0 3.7 3.6 4.0  CL 95* 95* 96* 97* 97*  CO2 31 30 28 29 28   GLUCOSE 157* 177* 165* 209* 165*  BUN 20 21 21 15 16   CREATININE 0.82 0.81 0.78 0.68 0.82  CALCIUM 8.3* 8.3* 8.5* 8.2* 8.4*  MG  --   --   --  1.5*  --    GFR: Estimated Creatinine Clearance: 118.4 mL/min (by C-G formula based on SCr of 0.82 mg/dL). Liver Function Tests: Recent Labs  Lab 12/17/17 0425 12/21/17 0643 12/22/17 0413 12/23/17 0431  AST 46* 38 33 29  ALT 37 35 34 32  ALKPHOS 123 118 109 108  BILITOT 3.1* 2.5* 2.5* 1.8*  PROT 6.1* 6.2* 5.8* 5.5*  ALBUMIN 2.7* 2.6* 2.5* 2.4*   No results for input(s): LIPASE, AMYLASE in the last 168 hours. No results for input(s): AMMONIA in the last 168 hours. Coagulation Profile: Recent Labs  Lab 12/18/17 0824  INR 1.15   Cardiac Enzymes: No results for input(s): CKTOTAL, CKMB, CKMBINDEX, TROPONINI in the last 168 hours. BNP (last 3 results) No results for input(s): PROBNP in the last 8760 hours. HbA1C: No results for input(s): HGBA1C in the last 72 hours. CBG: Recent Labs  Lab 12/22/17 0723 12/22/17 1150 12/22/17 1613 12/22/17 2102 12/23/17 0807  GLUCAP 160* 195* 227* 204* 172*   Lipid Profile: No results for input(s): CHOL, HDL, LDLCALC, TRIG, CHOLHDL, LDLDIRECT in the last 72 hours. Thyroid Function Tests: No results for input(s): TSH, T4TOTAL, FREET4, T3FREE, THYROIDAB in the last 72 hours. Anemia Panel: No results for input(s): VITAMINB12, FOLATE, FERRITIN, TIBC, IRON, RETICCTPCT in the last 72 hours. Sepsis Labs: No results for input(s): PROCALCITON, LATICACIDVEN in the last 168 hours.  Recent Results (from the past 240 hour(s))  Culture, body fluid-bottle     Status: None   Collection Time: 12/16/17 11:43 AM  Result Value Ref Range Status   Specimen Description PERITONEAL  Final   Special Requests NONE  Final   Culture   Final    NO GROWTH 5 DAYS Performed at [facility], [address] [place], [place] [address]    Report  Status 12/21/2017 FINAL  Final  Gram stain     Status: None   Collection Time: 12/16/17 11:43 AM  Result Value Ref Range Status   Specimen Description PERITONEAL  Final   Special Requests NONE  Final   Gram Stain   Final    WBC PRESENT,BOTH PMN AND MONONUCLEAR NO ORGANISMS SEEN CYTOSPIN SMEAR Performed at [facility], [place] [address] [place], [place] [address]    Report Status 12/16/2017 FINAL  Final         Radiology Studies: No results found.      Scheduled Meds: . carvedilol  3.125 mg Oral BID WC  . feeding supplement (PRO-STAT SUGAR FREE 64)  30 mL Oral BID  . furosemide  40 mg Oral BID  . insulin aspart  0-5 Units Subcutaneous QHS  . insulin aspart  0-9 Units Subcutaneous TID WC  . loratadine  10 mg Oral Daily  . nicotine  21 mg Transdermal Daily  . pantoprazole  40 mg Oral Daily  . polyethylene glycol  17 g Oral Daily  . potassium chloride SA  20 mEq Oral BID  . sodium chloride flush  3 mL Intravenous Q12H   Continuous Infusions: . sodium chloride 10 mL/hr at 12/22/17 2148  . ciprofloxacin 400 mg (12/23/17 0844)     LOS: 8 days     Cordelia Poche, MD [facility] 12/23/2017, 9:53 AM Pager: [phone]  If 7PM-7AM, please contact night-coverage [email or web address] 12/23/2017, 9:53 AM

## 2017-12-23 NOTE — Progress Notes (Signed)
Patient with history of small cell carcinoma with metastases to liver and cervical spine, recently admitted to [facility] for volume overload presented for therapeutic paracentesis today at request of Dr. Lonny Prude. Patient with previous diagnostic and therapeutic paracentesis on 9/2 yielding 2.4 L of fluid. Testing of this fluid has yielded no growth to date.  Patient reports some abdominal tightness recently and was concerned ascites had re-accumulated. Limited abdominal US performed today showing very small amount of fluid in abdomen which is unlikely to be cause of patient's abdominal tightness at this time. Given patient's pancytopenia and recent peritoneal fluid analysis without growth it was discussed with patient that risk of proceeding with paracentesis today outweighed benefits. Patient stated understanding and is agreeable to hold off on repeat paracentesis as this time.  Discussed with patient today that we are happy to re-evaluate patient in the future for benefit from paracentesis if primary team feels this is appropriate. Please call IR with questions or concerns.    Candiss Norse, PA-C 12/23/17 1433 PA office: [phone]

## 2017-12-24 ENCOUNTER — Telehealth: Payer: Self-pay | Admitting: *Deleted

## 2017-12-24 LAB — CBC WITH DIFFERENTIAL/PLATELET
BASOS ABS: 0 10*3/uL (ref 0.0–0.1)
Basophils Relative: 0 %
EOS ABS: 0.2 10*3/uL (ref 0.0–0.7)
Eosinophils Relative: 10 %
HCT: 22.7 % — ABNORMAL LOW (ref 39.0–52.0)
HEMOGLOBIN: 7.9 g/dL — AB (ref 13.0–17.0)
LYMPHS ABS: 0.8 10*3/uL (ref 0.7–4.0)
LYMPHS PCT: 41 %
MCH: 32.8 pg (ref 26.0–34.0)
MCHC: 34.8 g/dL (ref 30.0–36.0)
MCV: 94.2 fL (ref 78.0–100.0)
MONOS PCT: 7 %
Monocytes Absolute: 0.1 10*3/uL (ref 0.1–1.0)
NEUTROS PCT: 42 %
Neutro Abs: 0.9 10*3/uL — ABNORMAL LOW (ref 1.7–7.7)
Platelets: 21 10*3/uL — CL (ref 150–400)
RBC: 2.41 MIL/uL — ABNORMAL LOW (ref 4.22–5.81)
RDW: 15.3 % (ref 11.5–15.5)
WBC: 2 10*3/uL — AB (ref 4.0–10.5)

## 2017-12-24 LAB — PREPARE PLATELET PHERESIS: UNIT DIVISION: 0

## 2017-12-24 LAB — BPAM PLATELET PHERESIS
BLOOD PRODUCT EXPIRATION DATE: 201909112359
ISSUE DATE / TIME: 201909092345
Unit Type and Rh: 6200

## 2017-12-24 LAB — GLUCOSE, CAPILLARY
Glucose-Capillary: 176 mg/dL — ABNORMAL HIGH (ref 70–99)
Glucose-Capillary: 198 mg/dL — ABNORMAL HIGH (ref 70–99)

## 2017-12-24 LAB — MAGNESIUM: MAGNESIUM: 1.6 mg/dL — AB (ref 1.7–2.4)

## 2017-12-24 MED ORDER — MAGNESIUM OXIDE -MG SUPPLEMENT 400 (240 MG) MG PO TABS: 1.0000 | ORAL_TABLET | Freq: Every day | ORAL | 0 refills | Status: AC

## 2017-12-24 MED ORDER — CARVEDILOL 3.125 MG PO TABS: 3.1250 mg | ORAL_TABLET | Freq: Two times a day (BID) | ORAL | 0 refills | Status: DC

## 2017-12-24 MED ORDER — MAGNESIUM SULFATE 4 GM/100ML IV SOLN
4.0000 g | Freq: Once | INTRAVENOUS | Status: AC
  Administered 2017-12-24: 4 g via INTRAVENOUS
  Filled 2017-12-24: qty 100

## 2017-12-24 NOTE — Telephone Encounter (Signed)
Pt daughter called [facility] asking when he will be discharged from hospital. And may need to cancel pt's appt on Monday 9/16.  Returned call to [name],[facility] we do not have any additional information on a possible discharge date for pt. Recommended pt to keep current appt 9/16.

## 2017-12-24 NOTE — Discharge Summary (Signed)
Physician Discharge Summary  Geremy Rister ZDG:[id number] DOB: January 02, 1946 DOA: 12/13/2017  PCP: [name], MD  Admit date: 12/13/2017 Discharge date: 12/24/2017  Admitted From: Home Disposition: Home  Recommendations for Outpatient Follow-up:  1. Follow up with Oncologist in one week 2. Please obtain BMP/CBC in one week 3. Please follow up on the following pending results: None  Home Health: PT, OT, RN Equipment/Devices: None  Discharge Condition: Stable CODE STATUS: Full code Diet recommendation: Heart healthy diet   Brief/Interim Summary:  Trinton Prewitt is a 72 y.o. 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 He presented with anasarca. He has been diuresis and provided blood transfusions for anemia and thrombocytopenia. He also had leukopenia requiring Granix, which has improved.  Hospital course:  Anasarca Secondary to hypoalbuminemia and fluid overload from liver dysfunction. Patient's edema is moving more central secondary to positioning in the bed.Treated with Lasix. Dietitian consulted. Weight down about 16 lbs since admission. Stable.   Concern for SBP Does not appear patient had symptoms concerning for SBP. Gram stain and culture negative.Treated empirically with ciprofloxacin for 7 days.  Abnormal LFTs Ascites Secondary to metastatic disease to the liver. He is s/p paracentesis on 9/2. Patient complaining of worsening abdominal distension and pain, however, ultrasound without significant fluid.  Pancytopenia In setting of chemotherapy secondary to malignancy. Received two doses of Granix. Dubois has improved to 900 prior to discharge. S/p 3 units of PRBC and 5 units of platelets. On discussion with hematology/oncology, recommendations for follow-up outpatient with regard to hemoglobin and platelets.  Diabetes mellitus, type 2 Sliding scale insulin while inpatient. Resume metformin on  discharge.  GERD Continued Prilosec  Hypomagnesemia Magnesium oxide on discharge. Recheck as an outpatient.  Discharge Diagnoses:  Principal Problem:   Edema Active Problems:   DM (diabetes mellitus), type 2 (HCC)   Anemia   Severe protein-calorie malnutrition (HCC)   Abnormal liver function   Leukocytosis   SBP (spontaneous bacterial peritonitis) (HCC)   Abdominal distention   Hypervolemia   Malnutrition of moderate degree    Discharge Instructions  Discharge Instructions    Call MD for:  difficulty breathing, headache or visual disturbances   Complete by:  As directed    Call MD for:  persistant dizziness or light-headedness   Complete by:  As directed    Call MD for:  temperature >100.4   Complete by:  As directed    Diet - low sodium heart healthy   Complete by:  As directed    Increase activity slowly   Complete by:  As directed      Allergies as of 12/24/2017      Reactions   Amoxicillin-pot Clavulanate Anaphylaxis   Has patient had a PCN reaction causing immediate rash, facial/tongue/throat swelling, SOB or lightheadedness with hypotension: Yes Has patient had a PCN reaction causing severe rash involving mucus membranes or skin necrosis: Yes Has patient had a PCN reaction that required hospitalization: Yes Has patient had a PCN reaction occurring within the last 10 years: Yes If all of the above answers are "NO", then may proceed with Cephalosporin use.   Nitrofurantoin Anaphylaxis      Medication List    TAKE these medications   carvedilol 3.125 MG tablet Commonly known as:  COREG Take 1 tablet (3.125 mg total) by mouth 2 (two) times daily with a meal.   CINNAMON PO Take 600 mg by mouth 2 (two) times daily.   fish oil-omega-3  fatty acids 1000 MG capsule Take 1 g by mouth 2 (two) times daily.   fluticasone 50 MCG/ACT nasal spray Commonly known as:  FLONASE Place 2 sprays into both nostrils daily as needed for allergies.   furosemide 20 MG  tablet Commonly known as:  LASIX Take 2 tablets (40 mg total) by mouth 2 (two) times daily.   insulin aspart 100 UNIT/ML injection Commonly known as:  novoLOG CBG < 70: implement hypoglycemia protocol CBG 70 - 120: 0 units CBG 121 - 150: 2 units CBG 151 - 200: 3 units CBG 201 - 250: 5 units CBG 251 - 300: 8 units CBG 301 - 350: 11 units CBG 351 - 400: 15 units CBG > 400: call MD   loratadine 10 MG tablet Commonly known as:  CLARITIN Take 1 tablet (10 mg total) by mouth daily.   metFORMIN 1000 MG tablet Commonly known as:  GLUCOPHAGE Take 1,000 mg by mouth 2 (two) times daily with a meal.   nicotine 21 mg/24hr patch Commonly known as:  NICODERM CQ - dosed in mg/24 hours Place 1 patch (21 mg total) onto the skin daily.   omeprazole 20 MG capsule Commonly known as:  PRILOSEC Take 20 mg by mouth daily.   polyethylene glycol packet Commonly known as:  MIRALAX / GLYCOLAX Take 17 g by mouth daily.   Potassium Chloride ER 20 MEQ Tbcr Take 20 mEq by mouth 2 (two) times daily.   Red Yeast Rice 600 MG Tabs Take 1 tablet by mouth 2 (two) times daily.   traMADol 50 MG tablet Commonly known as:  ULTRAM Take 1 tablet (50 mg total) by mouth every 6 (six) hours as needed for moderate pain.      Follow-up Information    Curt Bears, MD. Go on 12/30/2017.   Specialty:  Oncology Contact information: [address] [place]  [id number] [phone]          Allergies  Allergen Reactions  . Amoxicillin-Pot Clavulanate Anaphylaxis    Has patient had a PCN reaction causing immediate rash, facial/tongue/throat swelling, SOB or lightheadedness with hypotension: Yes Has patient had a PCN reaction causing severe rash involving mucus membranes or skin necrosis: Yes Has patient had a PCN reaction that required hospitalization: Yes Has patient had a PCN reaction occurring within the last 10 years: Yes If all of the above answers are "NO", then may proceed with Cephalosporin  use.   . Nitrofurantoin Anaphylaxis    Consultations:  None   Procedures/Studies: Dg Chest 2 View  Result Date: 12/03/2017 CLINICAL DATA:  Possible infection.  Fever. EXAM: CHEST - 2 VIEW COMPARISON:  Radiograph and CT 11/26/2017 FINDINGS: Unchanged heart size and mediastinal contours. Small pleural effusions which are similar to prior exam allowing for differences in technique. No pulmonary edema. Right apical nodule on CT not well seen radiographically. No new airspace disease. No pneumothorax. IMPRESSION: Unchanged pleural effusions over the past week. No new abnormality. Right apical nodule not well seen radiographically. Electronically Signed   By: Jeb Levering M.D.   On: 12/03/2017 02:19   Dg Chest 2 View  Result Date: 11/24/2017 CLINICAL DATA:  Leg swelling and blisters EXAM: CHEST - 2 VIEW COMPARISON:  November 08, 2017 FINDINGS: The heart size and mediastinal contours are within normal limits. There is no focal infiltrate or pulmonary edema. There are small bilateral posterior pleural effusions. The visualized skeletal structures are stable. IMPRESSION: Small bilateral posterior pleural effusions.  No focal pneumonia. Electronically Signed  By: Abelardo Diesel M.D.   On: 11/24/2017 17:30   Ct Angio Chest Pe W Or Wo Contrast  Result Date: 11/26/2017 CLINICAL DATA:  Shortness of breath. History of small cell lung cancer, currently on chemotherapy. EXAM: CT ANGIOGRAPHY CHEST WITH CONTRAST TECHNIQUE: Multidetector CT imaging of the chest was performed using the standard protocol during bolus administration of intravenous contrast. Multiplanar CT image reconstructions and MIPs were obtained to evaluate the vascular anatomy. CONTRAST:  111mL ISOVUE-370 IOPAMIDOL (ISOVUE-370) INJECTION 76% COMPARISON:  CT chest dated November 19, 2017. FINDINGS: Cardiovascular: Suboptimal opacification of the pulmonary arteries due to bolus timing. No central or lobar pulmonary embolism. The segmental pulmonary  arteries are not well evaluated. Normal heart size. No pericardial effusion. Normal caliber thoracic aorta. Coronary, aortic arch, and branch vessel atherosclerotic vascular disease. Mediastinum/Nodes: Unchanged enlarged 1.2 cm and 2.7 cm right paratracheal lymph nodes. Unchanged 9 mm right periaortic lymph node. No hilar or axillary lymphadenopathy. The thyroid gland, trachea, and esophagus are unremarkable. Lungs/Pleura: Unchanged 8 mm irregular nodule within the right lung apex. Increased linear atelectasis in the right lung apex. One 6 mm nodule along the minor fissure is unchanged in size. The other previously seen nodule along the minor fissure is no longer identified. No new pulmonary nodule. Slight interval increase in size of small right pleural effusion with adjacent right lower lobe subsegmental atelectasis. Unchanged small left pleural effusion. No consolidation or pneumothorax. Upper Abdomen: Nodular liver contour suggestive of cirrhosis. Hypodense metastases within the posterior right hepatic lobe are not as well evaluated on today's study, but appear grossly unchanged. Small ascites, unchanged. Musculoskeletal: Unchanged subcentimeter lucent lesions in the T5 and T9 vertebral bodies. No acute osseous finding. Review of the MIP images confirms the above findings. IMPRESSION: 1. Suboptimal opacification of the pulmonary arteries. No central or lobar pulmonary embolism. 2. Slight interval increase in size of small right pleural effusion. Unchanged small left pleural effusion. 3. Unchanged right apical lung cancer. Stable mediastinal adenopathy and hepatic metastases. 4. Nodular liver contour, suggestive of cirrhosis. Unchanged small ascites. 5.  Aortic atherosclerosis (ICD10-I70.0). Electronically Signed   By: Titus Dubin M.D.   On: 11/26/2017 18:55   US Abdomen Complete  Result Date: 12/14/2017 CLINICAL DATA:  72 year old male with abdominal distention. EXAM: ABDOMEN ULTRASOUND COMPLETE  COMPARISON:  CT of the abdomen pelvis dated 11/08/2017 FINDINGS: Evaluation is limited due to patient's body habitus, portable technique, and inability of the patient to cooperate with the exam. Gallbladder: No gallstones or wall thickening visualized. No sonographic Murphy sign noted by sonographer. Common bile duct: Diameter: 4 mm Liver: The liver is heterogeneous and echogenic, likely related to fatty infiltration. There is irregularity of the hepatic contour which may represent cirrhosis or pseudo cirrhosis. The known metastatic lesions are poorly visualized on this ultrasound. Portal vein is patent on color Doppler imaging with normal direction of blood flow towards the liver. IVC: No abnormality visualized. Pancreas: Not visualized and obscured by bowel gas. Spleen: Size and appearance within normal limits. Right Kidney: Length: 11 cm. Normal echogenicity. No hydronephrosis or shadowing stone. Left Kidney: Length: 12 cm. Suboptimally visualized. No hydronephrosis or large shadowing stone. Abdominal aorta: Not well visualized. Other findings: There is small to moderate ascites. IMPRESSION: 1. Heterogeneous liver with findings of cirrhosis or pseudo cirrhosis. Known hepatic metastatic disease are suboptimally seen on this ultrasound. 2. Ascites. Electronically Signed   By: Anner Crete M.D.   On: 12/14/2017 03:48   US Abdomen Limited  Result Date: 12/23/2017  CLINICAL DATA:  72 year old male with abdominal distension, possible ascites EXAM: LIMITED ABDOMEN ULTRASOUND FOR ASCITES TECHNIQUE: Limited ultrasound survey for ascites was performed in all four abdominal quadrants. COMPARISON:  Prior abdominal ultrasound 12/14/2017 FINDINGS: Sonographic interrogation of the 4 quadrants of the abdomen demonstrates only a small volume ascites in the left hemiabdomen. The fluid is deemed insufficient for paracentesis. IMPRESSION: Small volume ascites, insufficient for paracentesis. Electronically Signed   By: Jacqulynn Cadet M.D.   On: 12/23/2017 15:18   US Paracentesis  Result Date: 12/16/2017 INDICATION: Abdominal distention. Newly diagnosed ascites. Request diagnostic and therapeutic paracentesis. EXAM: ULTRASOUND GUIDED LEFT LOWER QUADRANT PARACENTESIS MEDICATIONS: None. COMPLICATIONS: None immediate. PROCEDURE: Informed written consent was obtained from the patient after a discussion of the risks, benefits and alternatives to treatment. A timeout was performed prior to the initiation of the procedure. Initial ultrasound scanning demonstrates a moderate amount of ascites within the left lower abdominal quadrant. The left lower abdomen was prepped and draped in the usual sterile fashion. 1% lidocaine with epinephrine was used for local anesthesia. Following this, a 19 gauge, 10-cm, Yueh catheter was introduced. An ultrasound image was saved for documentation purposes. The paracentesis was performed. The catheter was removed and a dressing was applied. The patient tolerated the procedure well without immediate post procedural complication. FINDINGS: A total of approximately 2.4 L of clear yellow fluid was removed. Samples were sent to the laboratory as requested by the clinical team. IMPRESSION: Successful ultrasound-guided paracentesis yielding 2.4 liters of peritoneal fluid. Read by: Ascencion Dike PA-C Electronically Signed   By: Jerilynn Mages.  Shick M.D.   On: 12/16/2017 12:16   Dg Chest Port 1 View  Result Date: 12/13/2017 CLINICAL DATA:  Patient with 10 pound weight gain. Shortness of breath. EXAM: PORTABLE CHEST 1 VIEW COMPARISON:  Chest radiograph 12/03/2017 FINDINGS: Stable cardiac and mediastinal contours. Bilateral interstitial pulmonary opacities. Low lung volumes. No definite pleural effusion or pneumothorax. IMPRESSION: Interstitial opacities may represent mild edema. Electronically Signed   By: Lovey Newcomer M.D.   On: 12/13/2017 20:50   Dg Chest Port 1 View  Result Date: 11/26/2017 CLINICAL DATA:  Short of  breath EXAM: PORTABLE CHEST 1 VIEW COMPARISON:  11/24/2017, 11/08/2017, CT chest 11/19/2017, PET-CT 10/21/2017 FINDINGS: Hazy bibasilar opacity. Stable cardiomediastinal silhouette with aortic atherosclerosis. No pneumothorax. CT demonstrated right apical lung nodule not well seen radiographically. IMPRESSION: Hazy bibasilar opacity, may reflect atelectasis or infiltrate. Electronically Signed   By: Donavan Foil M.D.   On: 11/26/2017 17:02    Subjective: No issues overnight.  Discharge Exam: Vitals:   12/24/17 0511 12/24/17 1254  BP: 127/72 (!) 110/56  Pulse: 89 85  Resp: 20 18  Temp: 98.3 F (36.8 C) 99.3 F (37.4 C)  SpO2: 95% 94%   Vitals:   12/24/17 0005 12/24/17 0158 12/24/17 0511 12/24/17 1254  BP: 119/63 118/60 127/72 (!) 110/56  Pulse: 90 94 89 85  Resp: 20 18 20 18   Temp: 98.2 F (36.8 C) 98.2 F (36.8 C) 98.3 F (36.8 C) 99.3 F (37.4 C)  TempSrc: Oral Oral  Oral  SpO2: 95% 94% 95% 94%  Weight:      Height:        General: Pt is alert, awake, not in acute distress Cardiovascular: RRR, S1/S2 +, no rubs, no gallops Respiratory: CTA bilaterally, no wheezing, no rhonchi Abdominal: Soft, NT, ND, bowel sounds + Extremities: +edema, no cyanosis    The results of significant diagnostics from this hospitalization (including imaging, microbiology, ancillary  and laboratory) are listed below for reference.     Microbiology: Recent Results (from the past 240 hour(s))  Culture, body fluid-bottle     Status: None   Collection Time: 12/16/17 11:43 AM  Result Value Ref Range Status   Specimen Description PERITONEAL  Final   Special Requests NONE  Final   Culture   Final    NO GROWTH 5 DAYS Performed at [facility], [address], [place], [place] [address]    Report Status 12/21/2017 FINAL  Final  Gram stain     Status: None   Collection Time: 12/16/17 11:43 AM  Result Value Ref Range Status   Specimen Description PERITONEAL  Final   Special Requests NONE   Final   Gram Stain   Final    WBC PRESENT,BOTH PMN AND MONONUCLEAR NO ORGANISMS SEEN CYTOSPIN SMEAR Performed at [facility], [place] [address], [place], [place] [address]    Report Status 12/16/2017 FINAL  Final     Labs: BNP (last 3 results) Recent Labs    11/26/17 1640 12/13/17 2211  BNP 116.9* 54.6   Basic Metabolic Panel: Recent Labs  Lab 12/18/17 0427 12/19/17 0413 12/21/17 0643 12/22/17 0413 12/23/17 0431 12/24/17 0358  NA 134* 133* 134* 134* 134*  --   K 4.0 4.0 3.7 3.6 4.0  --   CL 95* 95* 96* 97* 97*  --   CO2 31 30 28 29 28   --   GLUCOSE 157* 177* 165* 209* 165*  --   BUN 20 21 21 15 16   --   CREATININE 0.82 0.81 0.78 0.68 0.82  --   CALCIUM 8.3* 8.3* 8.5* 8.2* 8.4*  --   MG  --   --   --  1.5* 1.6* 1.6*   Liver Function Tests: Recent Labs  Lab 12/21/17 0643 12/22/17 0413 12/23/17 0431  AST 38 33 29  ALT 35 34 32  ALKPHOS 118 109 108  BILITOT 2.5* 2.5* 1.8*  PROT 6.2* 5.8* 5.5*  ALBUMIN 2.6* 2.5* 2.4*   No results for input(s): LIPASE, AMYLASE in the last 168 hours. No results for input(s): AMMONIA in the last 168 hours. CBC: Recent Labs  Lab 12/19/17 0413 12/20/17 0403 12/21/17 0457 12/22/17 0413 12/23/17 0431 12/24/17 0358  WBC 0.6* 0.5* 0.6* 1.0* 1.6* 2.0*  NEUTROABS 0.0*  --  0.1* 0.3* 0.6* 0.9*  HGB 7.9* 7.7* 7.4* 8.1* 8.1* 7.9*  HCT 22.8* 22.0* 20.9* 23.2* 23.2* 22.7*  MCV 95.0 94.4 94.1 93.9 94.7 94.2  PLT 10* 14* 19* 20* 13* 21*   Cardiac Enzymes: No results for input(s): CKTOTAL, CKMB, CKMBINDEX, TROPONINI in the last 168 hours. BNP: Invalid input(s): POCBNP CBG: Recent Labs  Lab 12/23/17 1151 12/23/17 1641 12/23/17 2117 12/24/17 0812 12/24/17 1202  GLUCAP 166* 220* 184* 176* 198*   D-Dimer No results for input(s): DDIMER in the last 72 hours. Hgb A1c No results for input(s): HGBA1C in the last 72 hours. Lipid Profile No results for input(s): CHOL, HDL, LDLCALC, TRIG, CHOLHDL, LDLDIRECT in the last 72  hours. Thyroid function studies No results for input(s): TSH, T4TOTAL, T3FREE, THYROIDAB in the last 72 hours.  Invalid input(s): FREET3 Anemia work up No results for input(s): VITAMINB12, FOLATE, FERRITIN, TIBC, IRON, RETICCTPCT in the last 72 hours. Urinalysis    Component Value Date/Time   COLORURINE YELLOW 12/13/2017 2222   APPEARANCEUR CLEAR 12/13/2017 2222   LABSPEC 1.008 12/13/2017 2222   PHURINE 5.0 12/13/2017 2222   GLUCOSEU  NEGATIVE 12/13/2017 2222   HGBUR NEGATIVE 12/13/2017 2222   BILIRUBINUR NEGATIVE 12/13/2017 2222   KETONESUR NEGATIVE 12/13/2017 2222   PROTEINUR NEGATIVE 12/13/2017 2222   NITRITE NEGATIVE 12/13/2017 2222   LEUKOCYTESUR NEGATIVE 12/13/2017 2222   Sepsis Labs Invalid input(s): PROCALCITONIN,  WBC,  LACTICIDVEN Microbiology Recent Results (from the past 240 hour(s))  Culture, body fluid-bottle     Status: None   Collection Time: 12/16/17 11:43 AM  Result Value Ref Range Status   Specimen Description PERITONEAL  Final   Special Requests NONE  Final   Culture   Final    NO GROWTH 5 DAYS Performed at [facility], [address] [place], [place] [address]    Report Status 12/21/2017 FINAL  Final  Gram stain     Status: None   Collection Time: 12/16/17 11:43 AM  Result Value Ref Range Status   Specimen Description PERITONEAL  Final   Special Requests NONE  Final   Gram Stain   Final    WBC PRESENT,BOTH PMN AND MONONUCLEAR NO ORGANISMS SEEN CYTOSPIN SMEAR Performed at [facility], [place] [address], [place],  [address]    Report Status 12/16/2017 FINAL  Final     SIGNED:   Cordelia Poche, MD [facility] 12/24/2017, 3:45 PM

## 2017-12-24 NOTE — Progress Notes (Signed)
Discharged to home with family  D Clinical research associate

## 2017-12-25 ENCOUNTER — Telehealth: Payer: Self-pay | Admitting: *Deleted

## 2017-12-25 NOTE — Telephone Encounter (Signed)
Spoke with [name], discussed pt to keep his appt for Monday 9/16

## 2017-12-30 ENCOUNTER — Inpatient Hospital Stay: Payer: BLUE CROSS/BLUE SHIELD

## 2017-12-30 ENCOUNTER — Encounter: Payer: Self-pay | Admitting: Internal Medicine

## 2017-12-30 ENCOUNTER — Telehealth: Payer: Self-pay | Admitting: Internal Medicine

## 2017-12-30 ENCOUNTER — Inpatient Hospital Stay (HOSPITAL_BASED_OUTPATIENT_CLINIC_OR_DEPARTMENT_OTHER): Payer: BLUE CROSS/BLUE SHIELD | Admitting: Internal Medicine

## 2017-12-30 VITALS — BP 131/68 | HR 89 | Temp 98.4°F | Resp 20 | Ht 75.0 in | Wt 274.4 lb

## 2017-12-30 DIAGNOSIS — C3411 Malignant neoplasm of upper lobe, right bronchus or lung: Secondary | ICD-10-CM

## 2017-12-30 DIAGNOSIS — R5381 Other malaise: Secondary | ICD-10-CM | POA: Diagnosis not present

## 2017-12-30 DIAGNOSIS — C787 Secondary malignant neoplasm of liver and intrahepatic bile duct: Secondary | ICD-10-CM

## 2017-12-30 DIAGNOSIS — M6281 Muscle weakness (generalized): Secondary | ICD-10-CM

## 2017-12-30 DIAGNOSIS — M1990 Unspecified osteoarthritis, unspecified site: Secondary | ICD-10-CM | POA: Diagnosis not present

## 2017-12-30 DIAGNOSIS — Z5111 Encounter for antineoplastic chemotherapy: Secondary | ICD-10-CM

## 2017-12-30 DIAGNOSIS — Z79899 Other long term (current) drug therapy: Secondary | ICD-10-CM | POA: Diagnosis not present

## 2017-12-30 DIAGNOSIS — C3490 Malignant neoplasm of unspecified part of unspecified bronchus or lung: Secondary | ICD-10-CM

## 2017-12-30 DIAGNOSIS — J90 Pleural effusion, not elsewhere classified: Secondary | ICD-10-CM | POA: Diagnosis not present

## 2017-12-30 DIAGNOSIS — R590 Localized enlarged lymph nodes: Secondary | ICD-10-CM | POA: Diagnosis not present

## 2017-12-30 DIAGNOSIS — Z9221 Personal history of antineoplastic chemotherapy: Secondary | ICD-10-CM | POA: Diagnosis not present

## 2017-12-30 DIAGNOSIS — Z794 Long term (current) use of insulin: Secondary | ICD-10-CM

## 2017-12-30 DIAGNOSIS — I10 Essential (primary) hypertension: Secondary | ICD-10-CM

## 2017-12-30 DIAGNOSIS — D61810 Antineoplastic chemotherapy induced pancytopenia: Secondary | ICD-10-CM

## 2017-12-30 DIAGNOSIS — T451X5A Adverse effect of antineoplastic and immunosuppressive drugs, initial encounter: Secondary | ICD-10-CM

## 2017-12-30 DIAGNOSIS — R5383 Other fatigue: Secondary | ICD-10-CM

## 2017-12-30 DIAGNOSIS — E119 Type 2 diabetes mellitus without complications: Secondary | ICD-10-CM | POA: Diagnosis not present

## 2017-12-30 LAB — CBC WITH DIFFERENTIAL (CANCER CENTER ONLY)
BASOS ABS: 0 10*3/uL (ref 0.0–0.1)
BASOS PCT: 0 %
EOS ABS: 0.1 10*3/uL (ref 0.0–0.5)
EOS PCT: 1 %
HCT: 25.6 % — ABNORMAL LOW (ref 38.4–49.9)
Hemoglobin: 8.6 g/dL — ABNORMAL LOW (ref 13.0–17.1)
LYMPHS ABS: 1 10*3/uL (ref 0.9–3.3)
Lymphocytes Relative: 17 %
MCH: 32.8 pg (ref 27.2–33.4)
MCHC: 33.6 g/dL (ref 32.0–36.0)
MCV: 97.7 fL (ref 79.3–98.0)
Monocytes Absolute: 1 10*3/uL — ABNORMAL HIGH (ref 0.1–0.9)
Monocytes Relative: 18 %
Neutro Abs: 3.6 10*3/uL (ref 1.5–6.5)
Neutrophils Relative %: 64 %
PLATELETS: 42 10*3/uL — AB (ref 140–400)
RBC: 2.62 MIL/uL — AB (ref 4.20–5.82)
RDW: 17.2 % — ABNORMAL HIGH (ref 11.0–14.6)
WBC: 5.7 10*3/uL (ref 4.0–10.3)

## 2017-12-30 LAB — CMP (CANCER CENTER ONLY)
ALT: 21 U/L (ref 0–44)
AST: 25 U/L (ref 15–41)
Albumin: 2.6 g/dL — ABNORMAL LOW (ref 3.5–5.0)
Alkaline Phosphatase: 115 U/L (ref 38–126)
Anion gap: 9 (ref 5–15)
BILIRUBIN TOTAL: 1.5 mg/dL — AB (ref 0.3–1.2)
BUN: 11 mg/dL (ref 8–23)
CO2: 27 mmol/L (ref 22–32)
CREATININE: 0.86 mg/dL (ref 0.61–1.24)
Calcium: 8.3 mg/dL — ABNORMAL LOW (ref 8.9–10.3)
Chloride: 100 mmol/L (ref 98–111)
GFR, Est AFR Am: >60 mL/min (ref >60)
GFR, Estimated: >60 mL/min (ref >60)
Glucose, Bld: 134 mg/dL — ABNORMAL HIGH (ref 70–99)
Potassium: 3.8 mmol/L (ref 3.5–5.1)
Sodium: 136 mmol/L (ref 135–145)
TOTAL PROTEIN: 6.5 g/dL (ref 6.5–8.1)

## 2017-12-30 NOTE — Progress Notes (Signed)
[facility] [phone]   [phone]  OFFICE PROGRESS NOTE  [name], MD [place] [address]  DIAGNOSIS: Extensive stage small cell lung cancer presented with right upper lobe lung mass in addition to few other pulmonary nodules and significant mediastinal lymphadenopathy and liver metastases diagnosed in June 2019.   PRIOR THERAPY: None  CURRENT THERAPY: Systemic chemotherapy with carboplatin for AUC of 4 on day 1 and etoposide 100 mg/M2 on days 1, 2 and 3 with Neulasta support every 3 weeks.  Status post 4 cycles.  INTERVAL HISTORY: Robert Compton 72 y.o. male returns to the clinic today for follow-up visit accompanied by his daughter and son.  The patient is complaining of increasing fatigue and weakness.  He spent almost 11 days in the hospital after the last cycle of chemotherapy with significant pancytopenia.  His platelets count has been very low down to 13,000.  He received 1 unit of platelet transfusion during his admission.  He also received PRBCs transfusion.  The patient continues to have mild tenderness in his abdomen.  He denied having any recent weight loss or night sweats.  He has no nausea, vomiting, diarrhea or constipation.  He denied having any chest pain but has shortness of breath with exertion with no cough or hemoptysis.  He is here today for evaluation before starting cycle #5.  MEDICAL HISTORY: Past Medical History:  Diagnosis Date  . Arthritis   . Cancer ([place])   . Diabetes mellitus without complication ([place])   . Hypertension     ALLERGIES:  is allergic to amoxicillin-pot clavulanate and nitrofurantoin.  MEDICATIONS:  Current Outpatient Medications  Medication Sig Dispense Refill  . carvedilol (COREG) 3.125 MG tablet Take 1 tablet (3.125 mg total) by mouth 2 (two) times daily with a meal. 60 tablet 0  . CINNAMON PO Take 600 mg by mouth 2 (two) times daily.    . fish oil-omega-3 fatty acids  1000 MG capsule Take 1 g by mouth 2 (two) times daily.    . fluticasone (FLONASE) 50 MCG/ACT nasal spray Place 2 sprays into both nostrils daily as needed for allergies. 16 g 0  . furosemide (LASIX) 20 MG tablet Take 2 tablets (40 mg total) by mouth 2 (two) times daily. 60 tablet 0  . insulin aspart (NOVOLOG) 100 UNIT/ML injection CBG < 70: implement hypoglycemia protocol CBG 70 - 120: 0 units CBG 121 - 150: 2 units CBG 151 - 200: 3 units CBG 201 - 250: 5 units CBG 251 - 300: 8 units CBG 301 - 350: 11 units CBG 351 - 400: 15 units CBG > 400: call MD 10 mL 11  . loratadine (CLARITIN) 10 MG tablet Take 1 tablet (10 mg total) by mouth daily. 30 tablet 0  . Magnesium Oxide 400 (240 Mg) MG TABS Take 1 tablet (400 mg total) by mouth daily for 7 days. 7 tablet 0  . metFORMIN (GLUCOPHAGE) 1000 MG tablet Take 1,000 mg by mouth 2 (two) times daily with a meal.    . nicotine (NICODERM CQ - DOSED IN MG/24 HOURS) 21 mg/24hr patch Place 1 patch (21 mg total) onto the skin daily. 28 patch 0  . omeprazole (PRILOSEC) 20 MG capsule Take 20 mg by mouth daily.    . polyethylene glycol (MIRALAX / GLYCOLAX) packet Take 17 g by mouth daily. 14 each 0  . Potassium Chloride ER 20 MEQ TBCR Take 20 mEq by mouth 2 (two) times  daily. 60 tablet 0  . Red Yeast Rice 600 MG TABS Take 1 tablet by mouth 2 (two) times daily.     . traMADol (ULTRAM) 50 MG tablet Take 1 tablet (50 mg total) by mouth every 6 (six) hours as needed for moderate pain. (Patient not taking: Reported on 12/09/2017) 20 tablet 0   No current facility-administered medications for this visit.     SURGICAL HISTORY:  Past Surgical History:  Procedure Laterality Date  . HERNIA REPAIR    . JOINT REPLACEMENT Right    knee  . LIVER BIOPSY      REVIEW OF SYSTEMS:  Constitutional: positive for fatigue Eyes: negative Ears, nose, mouth, throat, and face: negative Respiratory: positive for dyspnea on exertion Cardiovascular: negative Gastrointestinal:  negative Genitourinary:negative Integument/breast: negative Hematologic/lymphatic: negative Musculoskeletal:positive for muscle weakness Neurological: negative Behavioral/Psych: negative Endocrine: negative Allergic/Immunologic: negative   PHYSICAL EXAMINATION: General appearance: alert, cooperative, fatigued and no distress Head: Normocephalic, without obvious abnormality, atraumatic Neck: no adenopathy, no JVD, supple, symmetrical, trachea midline and thyroid not enlarged, symmetric, no tenderness/mass/nodules Lymph nodes: Cervical, supraclavicular, and axillary nodes normal. Resp: clear to auscultation bilaterally Back: symmetric, no curvature. ROM normal. No CVA tenderness. Cardio: regular rate and rhythm, S1, S2 normal, no murmur, click, rub or gallop GI: soft, non-tender; bowel sounds normal; no masses,  no organomegaly Extremities: extremities normal, atraumatic, no cyanosis or edema Neurologic: Alert and oriented X 3, normal strength and tone. Normal symmetric reflexes. Normal coordination and gait  ECOG PERFORMANCE STATUS: 1 - Symptomatic but completely ambulatory  Blood pressure 131/68, pulse 89, temperature 98.4 F (36.9 C), temperature source Oral, resp. rate 20, height 6\' 3"  (1.905 m), weight 274 lb 6.4 oz (124.5 kg), SpO2 98 %.  LABORATORY DATA: Lab Results  Component Value Date   WBC 5.7 12/30/2017   HGB 8.6 (L) 12/30/2017   HCT 25.6 (L) 12/30/2017   MCV 97.7 12/30/2017   PLT 42 (L) 12/30/2017      Chemistry      Component Value Date/Time   NA 134 (L) 12/23/2017 0431   K 4.0 12/23/2017 0431   CL 97 (L) 12/23/2017 0431   CO2 28 12/23/2017 0431   BUN 16 12/23/2017 0431   CREATININE 0.82 12/23/2017 0431   CREATININE 1.07 12/09/2017 1002      Component Value Date/Time   CALCIUM 8.4 (L) 12/23/2017 0431   ALKPHOS 108 12/23/2017 0431   AST 29 12/23/2017 0431   AST 44 (H) 12/09/2017 1002   ALT 32 12/23/2017 0431   ALT 30 12/09/2017 1002   BILITOT 1.8 (H)  12/23/2017 0431   BILITOT 2.4 (H) 12/09/2017 1002       RADIOGRAPHIC STUDIES: Dg Chest 2 View  Result Date: 12/03/2017 CLINICAL DATA:  Possible infection.  Fever. EXAM: CHEST - 2 VIEW COMPARISON:  Radiograph and CT 11/26/2017 FINDINGS: Unchanged heart size and mediastinal contours. Small pleural effusions which are similar to prior exam allowing for differences in technique. No pulmonary edema. Right apical nodule on CT not well seen radiographically. No new airspace disease. No pneumothorax. IMPRESSION: Unchanged pleural effusions over the past week. No new abnormality. Right apical nodule not well seen radiographically. Electronically Signed   By: Jeb Levering M.D.   On: 12/03/2017 02:19   US Abdomen Complete  Result Date: 12/14/2017 CLINICAL DATA:  72 year old male with abdominal distention. EXAM: ABDOMEN ULTRASOUND COMPLETE COMPARISON:  CT of the abdomen pelvis dated 11/08/2017 FINDINGS: Evaluation is limited due to patient's body habitus, portable technique, and  inability of the patient to cooperate with the exam. Gallbladder: No gallstones or wall thickening visualized. No sonographic Murphy sign noted by sonographer. Common bile duct: Diameter: 4 mm Liver: The liver is heterogeneous and echogenic, likely related to fatty infiltration. There is irregularity of the hepatic contour which may represent cirrhosis or pseudo cirrhosis. The known metastatic lesions are poorly visualized on this ultrasound. Portal vein is patent on color Doppler imaging with normal direction of blood flow towards the liver. IVC: No abnormality visualized. Pancreas: Not visualized and obscured by bowel gas. Spleen: Size and appearance within normal limits. Right Kidney: Length: 11 cm. Normal echogenicity. No hydronephrosis or shadowing stone. Left Kidney: Length: 12 cm. Suboptimally visualized. No hydronephrosis or large shadowing stone. Abdominal aorta: Not well visualized. Other findings: There is small to moderate  ascites. IMPRESSION: 1. Heterogeneous liver with findings of cirrhosis or pseudo cirrhosis. Known hepatic metastatic disease are suboptimally seen on this ultrasound. 2. Ascites. Electronically Signed   By: Anner Crete M.D.   On: 12/14/2017 03:48   US Abdomen Limited  Result Date: 12/23/2017 CLINICAL DATA:  72 year old male with abdominal distension, possible ascites EXAM: LIMITED ABDOMEN ULTRASOUND FOR ASCITES TECHNIQUE: Limited ultrasound survey for ascites was performed in all four abdominal quadrants. COMPARISON:  Prior abdominal ultrasound 12/14/2017 FINDINGS: Sonographic interrogation of the 4 quadrants of the abdomen demonstrates only a small volume ascites in the left hemiabdomen. The fluid is deemed insufficient for paracentesis. IMPRESSION: Small volume ascites, insufficient for paracentesis. Electronically Signed   By: Jacqulynn Cadet M.D.   On: 12/23/2017 15:18   US Paracentesis  Result Date: 12/16/2017 INDICATION: Abdominal distention. Newly diagnosed ascites. Request diagnostic and therapeutic paracentesis. EXAM: ULTRASOUND GUIDED LEFT LOWER QUADRANT PARACENTESIS MEDICATIONS: None. COMPLICATIONS: None immediate. PROCEDURE: Informed written consent was obtained from the patient after a discussion of the risks, benefits and alternatives to treatment. A timeout was performed prior to the initiation of the procedure. Initial ultrasound scanning demonstrates a moderate amount of ascites within the left lower abdominal quadrant. The left lower abdomen was prepped and draped in the usual sterile fashion. 1% lidocaine with epinephrine was used for local anesthesia. Following this, a 19 gauge, 10-cm, Yueh catheter was introduced. An ultrasound image was saved for documentation purposes. The paracentesis was performed. The catheter was removed and a dressing was applied. The patient tolerated the procedure well without immediate post procedural complication. FINDINGS: A total of approximately 2.4 L  of clear yellow fluid was removed. Samples were sent to the laboratory as requested by the clinical team. IMPRESSION: Successful ultrasound-guided paracentesis yielding 2.4 liters of peritoneal fluid. Read by: Ascencion Dike PA-C Electronically Signed   By: Jerilynn Mages.  Shick M.D.   On: 12/16/2017 12:16   Dg Chest Port 1 View  Result Date: 12/13/2017 CLINICAL DATA:  Patient with 10 pound weight gain. Shortness of breath. EXAM: PORTABLE CHEST 1 VIEW COMPARISON:  Chest radiograph 12/03/2017 FINDINGS: Stable cardiac and mediastinal contours. Bilateral interstitial pulmonary opacities. Low lung volumes. No definite pleural effusion or pneumothorax. IMPRESSION: Interstitial opacities may represent mild edema. Electronically Signed   By: Lovey Newcomer M.D.   On: 12/13/2017 20:50    ASSESSMENT AND PLAN: This is a very pleasant 72 years old white male diagnosed with extensive stage small cell lung cancer presented with right upper lobe lung mass in addition to pulmonary nodules, mediastinal lymphadenopathy and extensive liver metastasis in June 2019. The patient is currently undergoing systemic chemotherapy with carboplatin and etoposide status post 4 cycles. The patient  was tolerating this treatment well except for pancytopenia after we increased his dose of carboplatin and etoposide on cycle 4.  He is spent 11 days in the hospital with significant pancytopenia as well as edema and congestive heart failure.  He has not recovered well from his recent hospitalization in the last cycle of the chemotherapy.  His platelets count are 42,000 today. I recommended for the patient to delay the start of cycle #5 of his treatment by 1 week.  I will reduce the dose of carboplatin again to AUC of 3 and etoposide 80 mg/M2. We will see the patient back for follow-up visit next week for evaluation before starting cycle #5. He was advised to call immediately if he has any concerning symptoms in the interval. The patient voices  understanding of current disease status and treatment options and is in agreement with the current care plan. All questions were answered. The patient knows to call the clinic with any problems, questions or concerns. We can certainly see the patient much sooner if necessary.  I spent 15 minutes counseling the patient face to face. The total time spent in the appointment was 25 minutes.  Disclaimer: This note was dictated with voice recognition software. Similar sounding words can inadvertently be transcribed and may not be corrected upon review.

## 2017-12-30 NOTE — Telephone Encounter (Signed)
Unable to schedule appt per 9/16 los - due to cap day - pt aware they will be called when appt is scheduled.

## 2017-12-31 ENCOUNTER — Inpatient Hospital Stay: Payer: BLUE CROSS/BLUE SHIELD

## 2018-01-01 ENCOUNTER — Telehealth: Payer: Self-pay | Admitting: *Deleted

## 2018-01-01 ENCOUNTER — Inpatient Hospital Stay: Payer: BLUE CROSS/BLUE SHIELD

## 2018-01-03 ENCOUNTER — Telehealth: Payer: Self-pay | Admitting: Internal Medicine

## 2018-01-03 NOTE — Telephone Encounter (Signed)
Appts scheduled patient daughter notified of date/time per 9/16 los

## 2018-01-06 ENCOUNTER — Ambulatory Visit: Payer: BLUE CROSS/BLUE SHIELD

## 2018-01-06 ENCOUNTER — Other Ambulatory Visit: Payer: BLUE CROSS/BLUE SHIELD

## 2018-01-06 ENCOUNTER — Inpatient Hospital Stay: Payer: BLUE CROSS/BLUE SHIELD

## 2018-01-07 ENCOUNTER — Inpatient Hospital Stay: Payer: BLUE CROSS/BLUE SHIELD

## 2018-01-07 VITALS — BP 138/70 | HR 72 | Temp 98.5°F | Resp 18

## 2018-01-07 DIAGNOSIS — C3490 Malignant neoplasm of unspecified part of unspecified bronchus or lung: Secondary | ICD-10-CM

## 2018-01-07 DIAGNOSIS — C3411 Malignant neoplasm of upper lobe, right bronchus or lung: Secondary | ICD-10-CM | POA: Diagnosis not present

## 2018-01-07 LAB — CBC WITH DIFFERENTIAL (CANCER CENTER ONLY)
BASOS ABS: 0 10*3/uL (ref 0.0–0.1)
BASOS PCT: 1 %
EOS ABS: 0 10*3/uL (ref 0.0–0.5)
EOS PCT: 0 %
HCT: 27.6 % — ABNORMAL LOW (ref 38.4–49.9)
HEMOGLOBIN: 9.2 g/dL — AB (ref 13.0–17.1)
LYMPHS ABS: 1.1 10*3/uL (ref 0.9–3.3)
Lymphocytes Relative: 19 %
MCH: 33.3 pg (ref 27.2–33.4)
MCHC: 33.3 g/dL (ref 32.0–36.0)
MCV: 100 fL — AB (ref 79.3–98.0)
Monocytes Absolute: 0.9 10*3/uL (ref 0.1–0.9)
Monocytes Relative: 15 %
Neutro Abs: 3.8 10*3/uL (ref 1.5–6.5)
Neutrophils Relative %: 65 %
Platelet Count: 110 10*3/uL — ABNORMAL LOW (ref 140–400)
RBC: 2.76 MIL/uL — ABNORMAL LOW (ref 4.20–5.82)
RDW: 18.2 % — AB (ref 11.0–14.6)
WBC: 5.8 10*3/uL (ref 4.0–10.3)

## 2018-01-07 LAB — CMP (CANCER CENTER ONLY)
ALT: 28 U/L (ref 0–44)
AST: 34 U/L (ref 15–41)
Albumin: 2.5 g/dL — ABNORMAL LOW (ref 3.5–5.0)
Alkaline Phosphatase: 108 U/L (ref 38–126)
Anion gap: 11 (ref 5–15)
BILIRUBIN TOTAL: 1.2 mg/dL (ref 0.3–1.2)
BUN: 11 mg/dL (ref 8–23)
CO2: 27 mmol/L (ref 22–32)
CREATININE: 0.91 mg/dL (ref 0.61–1.24)
Calcium: 8 mg/dL — ABNORMAL LOW (ref 8.9–10.3)
Chloride: 99 mmol/L (ref 98–111)
GFR, Est AFR Am: >60 mL/min (ref >60)
Glucose, Bld: 134 mg/dL — ABNORMAL HIGH (ref 70–99)
Potassium: 3.5 mmol/L (ref 3.5–5.1)
Sodium: 137 mmol/L (ref 135–145)
TOTAL PROTEIN: 6.9 g/dL (ref 6.5–8.1)

## 2018-01-07 MED ORDER — SODIUM CHLORIDE 0.9 % IV SOLN
450.0000 mg | Freq: Once | INTRAVENOUS | Status: AC
  Administered 2018-01-07: 450 mg via INTRAVENOUS
  Filled 2018-01-07: qty 45

## 2018-01-07 MED ORDER — PALONOSETRON HCL INJECTION 0.25 MG/5ML
INTRAVENOUS | Status: AC
  Filled 2018-01-07: qty 5

## 2018-01-07 MED ORDER — SODIUM CHLORIDE 0.9 % IV SOLN
Freq: Once | INTRAVENOUS | Status: AC
  Administered 2018-01-07: 15:00:00 via INTRAVENOUS
  Filled 2018-01-07: qty 5

## 2018-01-07 MED ORDER — PALONOSETRON HCL INJECTION 0.25 MG/5ML
0.2500 mg | Freq: Once | INTRAVENOUS | Status: AC
  Administered 2018-01-07: 0.25 mg via INTRAVENOUS

## 2018-01-07 MED ORDER — SODIUM CHLORIDE 0.9 % IV SOLN
Freq: Once | INTRAVENOUS | Status: AC
  Administered 2018-01-07: 15:00:00 via INTRAVENOUS
  Filled 2018-01-07: qty 250

## 2018-01-07 MED ORDER — SODIUM CHLORIDE 0.9 % IV SOLN
80.0000 mg/m2 | Freq: Once | INTRAVENOUS | Status: AC
  Administered 2018-01-07: 220 mg via INTRAVENOUS
  Filled 2018-01-07: qty 11

## 2018-01-07 NOTE — Patient Instructions (Signed)
[facility] Discharge Instructions for Patients Receiving Chemotherapy  Today you received the following chemotherapy agents: Carboplatin and Etoposide.  To help prevent nausea and vomiting after your treatment, we encourage you to take your nausea medication as directed   If you develop nausea and vomiting that is not controlled by your nausea medication, call the clinic.   BELOW ARE SYMPTOMS THAT SHOULD BE REPORTED IMMEDIATELY:  *FEVER GREATER THAN 100.5 F  *CHILLS WITH OR WITHOUT FEVER  NAUSEA AND VOMITING THAT IS NOT CONTROLLED WITH YOUR NAUSEA MEDICATION  *UNUSUAL SHORTNESS OF BREATH  *UNUSUAL BRUISING OR BLEEDING  TENDERNESS IN MOUTH AND THROAT WITH OR WITHOUT PRESENCE OF ULCERS  *URINARY PROBLEMS  *BOWEL PROBLEMS  UNUSUAL RASH Items with * indicate a potential emergency and should be followed up as soon as possible.  Feel free to call the clinic should you have any questions or concerns. The clinic phone number is [phone].  Please show the [facility] at check-in to the Emergency Department and triage nurse.

## 2018-01-07 NOTE — Progress Notes (Signed)
MD ok'd treatment today as long as counts ok.  No need to see provider today. Kennith Center, Pharm.D., CPP 01/07/2018@2 :15 PM

## 2018-01-07 NOTE — Progress Notes (Signed)
Carboplatin started at 1540. 1549 patient asked writer to take a look at his arm. He had red streaks from IV site to elbow. Patient denies any discomfort stating "it feels fine it is just cold". Infusion stopped. Lucianne Lei, [place] notified and pharmacy called. Blood return noted from IV site. Normal saline flowed freely. Lucianne Lei notified [name] and advised to continue.  Extravasation not suspected. Carbo restarted. Patient indicated arm began to feel very cold again. Infusion paused and new IV started. Continued Carboplatin. Pump Rate Verify not accurate due to stop and reprogram of pump following IV restart.

## 2018-01-08 ENCOUNTER — Inpatient Hospital Stay: Payer: BLUE CROSS/BLUE SHIELD

## 2018-01-08 VITALS — BP 133/76 | HR 89 | Temp 98.0°F | Resp 18 | Wt 283.0 lb

## 2018-01-08 DIAGNOSIS — C3490 Malignant neoplasm of unspecified part of unspecified bronchus or lung: Secondary | ICD-10-CM

## 2018-01-08 DIAGNOSIS — C3411 Malignant neoplasm of upper lobe, right bronchus or lung: Secondary | ICD-10-CM | POA: Diagnosis not present

## 2018-01-08 MED ORDER — DEXAMETHASONE SODIUM PHOSPHATE 10 MG/ML IJ SOLN
INTRAMUSCULAR | Status: AC
  Filled 2018-01-08: qty 1

## 2018-01-08 MED ORDER — SODIUM CHLORIDE 0.9 % IV SOLN
80.0000 mg/m2 | Freq: Once | INTRAVENOUS | Status: AC
  Administered 2018-01-08: 220 mg via INTRAVENOUS
  Filled 2018-01-08: qty 11

## 2018-01-08 MED ORDER — SODIUM CHLORIDE 0.9 % IV SOLN
Freq: Once | INTRAVENOUS | Status: AC
  Administered 2018-01-08: 10:00:00 via INTRAVENOUS
  Filled 2018-01-08: qty 250

## 2018-01-08 MED ORDER — DEXAMETHASONE SODIUM PHOSPHATE 10 MG/ML IJ SOLN
10.0000 mg | Freq: Once | INTRAMUSCULAR | Status: AC
  Administered 2018-01-08: 10 mg via INTRAVENOUS

## 2018-01-08 NOTE — Patient Instructions (Signed)
[facility] Discharge Instructions for Patients Receiving Chemotherapy  Today you received the following chemotherapy agents VP-16 To help prevent nausea and vomiting after your treatment, we encourage you to take your nausea As prescribed.  If you develop nausea and vomiting that is not controlled by your nausea medication, call the clinic.   BELOW ARE SYMPTOMS THAT SHOULD BE REPORTED IMMEDIATELY:  *FEVER GREATER THAN 100.5 F  *CHILLS WITH OR WITHOUT FEVER  NAUSEA AND VOMITING THAT IS NOT CONTROLLED WITH YOUR NAUSEA MEDICATION  *UNUSUAL SHORTNESS OF BREATH  *UNUSUAL BRUISING OR BLEEDING  TENDERNESS IN MOUTH AND THROAT WITH OR WITHOUT PRESENCE OF ULCERS  *URINARY PROBLEMS  *BOWEL PROBLEMS  UNUSUAL RASH Items with * indicate a potential emergency and should be followed up as soon as possible.  Feel free to call the clinic should you have any questions or concerns. The clinic phone number is [phone].  Please show the [facility] at check-in to the Emergency Department and triage nurse.

## 2018-01-09 ENCOUNTER — Telehealth: Payer: Self-pay | Admitting: Medical Oncology

## 2018-01-09 ENCOUNTER — Inpatient Hospital Stay: Payer: BLUE CROSS/BLUE SHIELD

## 2018-01-09 ENCOUNTER — Telehealth: Payer: Self-pay | Admitting: Internal Medicine

## 2018-01-09 ENCOUNTER — Ambulatory Visit: Payer: BLUE CROSS/BLUE SHIELD | Admitting: Medical

## 2018-01-09 VITALS — BP 122/61 | HR 85 | Temp 97.7°F | Resp 16 | Wt 288.0 lb

## 2018-01-09 DIAGNOSIS — C3490 Malignant neoplasm of unspecified part of unspecified bronchus or lung: Secondary | ICD-10-CM

## 2018-01-09 DIAGNOSIS — C3411 Malignant neoplasm of upper lobe, right bronchus or lung: Secondary | ICD-10-CM | POA: Diagnosis not present

## 2018-01-09 MED ORDER — DEXAMETHASONE SODIUM PHOSPHATE 10 MG/ML IJ SOLN
INTRAMUSCULAR | Status: AC
  Filled 2018-01-09: qty 1

## 2018-01-09 MED ORDER — SODIUM CHLORIDE 0.9 % IV SOLN
Freq: Once | INTRAVENOUS | Status: AC
  Administered 2018-01-09: 09:00:00 via INTRAVENOUS
  Filled 2018-01-09: qty 250

## 2018-01-09 MED ORDER — SODIUM CHLORIDE 0.9 % IV SOLN
80.0000 mg/m2 | Freq: Once | INTRAVENOUS | Status: AC
  Administered 2018-01-09: 220 mg via INTRAVENOUS
  Filled 2018-01-09: qty 11

## 2018-01-09 MED ORDER — DEXAMETHASONE SODIUM PHOSPHATE 10 MG/ML IJ SOLN
10.0000 mg | Freq: Once | INTRAMUSCULAR | Status: AC
  Administered 2018-01-09: 10 mg via INTRAVENOUS

## 2018-01-09 NOTE — Patient Instructions (Signed)
[facility] Discharge Instructions for Patients Receiving Chemotherapy  Today you received the following chemotherapy agents VP-16 (Etoposide).  To help prevent nausea and vomiting after your treatment, we encourage you to take your nausea As prescribed.  If you develop nausea and vomiting that is not controlled by your nausea medication, call the clinic.   BELOW ARE SYMPTOMS THAT SHOULD BE REPORTED IMMEDIATELY:  *FEVER GREATER THAN 100.5 F  *CHILLS WITH OR WITHOUT FEVER  NAUSEA AND VOMITING THAT IS NOT CONTROLLED WITH YOUR NAUSEA MEDICATION  *UNUSUAL SHORTNESS OF BREATH  *UNUSUAL BRUISING OR BLEEDING  TENDERNESS IN MOUTH AND THROAT WITH OR WITHOUT PRESENCE OF ULCERS  *URINARY PROBLEMS  *BOWEL PROBLEMS  UNUSUAL RASH Items with * indicate a potential emergency and should be followed up as soon as possible.  Feel free to call the clinic should you have any questions or concerns. The clinic phone number is [phone].  Please show the [facility] at check-in to the Emergency Department and triage nurse.

## 2018-01-09 NOTE — Progress Notes (Signed)
This nurse educated patient and patient's family members on importance of following up with Dessa Phi, DO (initial prescriber of Lasix).  Educated patient on if symptoms of shortness of breath, increase in edema, and oliguria start presenting to go to ED.  Voiced understanding and agreement.  Patient has tolerated infusion well thus far.  Will continue to monitor.

## 2018-01-09 NOTE — Telephone Encounter (Signed)
I told [name] that insurance will not cover Neulasta and pt will get Udencya . I also told [name] to contact PCP regarding weight gain in past 2 days. Schedule request sent for early appt on 10/8.

## 2018-01-09 NOTE — Telephone Encounter (Signed)
pts daughter r/s appts

## 2018-01-09 NOTE — Progress Notes (Signed)
Infusion Nurse called questioning if patient should proceed with Day 3 Etoposide with recent 5 lb weight gain since yesterday.  Found patient experienced the same thing on his last infusion but with triple the amount of weight gain.  However, with this etoposide he denied having any shortness of breath or inability to urinate.  Patient  Is currently taking Lasix 40 mg BID.  Per Obie Dredge, NP ok to treat and patient should continue to take Lasix 40 mg BID and follow up with the initial pre scriber of his Lasix since he has a history of CHF as they might feel that he needs to make adjustments to his dose during treatment.  Instructed infusion nurse to give guidance on when to seek emergency medical care if the weight gain should continue and if he becomes symptomatic.   Nurse Kathlee Nations expressed understanding and advised she would relay message directly to the patient.  Ok to proceed with today's treatment.

## 2018-01-10 ENCOUNTER — Emergency Department (HOSPITAL_COMMUNITY): Payer: BLUE CROSS/BLUE SHIELD

## 2018-01-10 ENCOUNTER — Other Ambulatory Visit: Payer: Self-pay

## 2018-01-10 ENCOUNTER — Inpatient Hospital Stay (HOSPITAL_COMMUNITY)
Admission: EM | Admit: 2018-01-10 | Discharge: 2018-01-18 | DRG: 435 | Disposition: A | Discharge status: Home Health | Payer: BLUE CROSS/BLUE SHIELD | Attending: Family Medicine | Admitting: Family Medicine

## 2018-01-10 ENCOUNTER — Telehealth: Payer: Self-pay | Admitting: *Deleted

## 2018-01-10 ENCOUNTER — Encounter (HOSPITAL_COMMUNITY): Payer: Self-pay | Admitting: *Deleted

## 2018-01-10 DIAGNOSIS — E876 Hypokalemia: Secondary | ICD-10-CM | POA: Diagnosis not present

## 2018-01-10 DIAGNOSIS — F17211 Nicotine dependence, cigarettes, in remission: Secondary | ICD-10-CM | POA: Diagnosis present

## 2018-01-10 DIAGNOSIS — Z818 Family history of other mental and behavioral disorders: Secondary | ICD-10-CM

## 2018-01-10 DIAGNOSIS — R7989 Other specified abnormal findings of blood chemistry: Secondary | ICD-10-CM | POA: Diagnosis present

## 2018-01-10 DIAGNOSIS — E8770 Fluid overload, unspecified: Secondary | ICD-10-CM

## 2018-01-10 DIAGNOSIS — R140 Abdominal distension (gaseous): Secondary | ICD-10-CM | POA: Diagnosis not present

## 2018-01-10 DIAGNOSIS — Z833 Family history of diabetes mellitus: Secondary | ICD-10-CM

## 2018-01-10 DIAGNOSIS — Z794 Long term (current) use of insulin: Secondary | ICD-10-CM

## 2018-01-10 DIAGNOSIS — C3490 Malignant neoplasm of unspecified part of unspecified bronchus or lung: Secondary | ICD-10-CM | POA: Diagnosis present

## 2018-01-10 DIAGNOSIS — E871 Hypo-osmolality and hyponatremia: Secondary | ICD-10-CM | POA: Diagnosis not present

## 2018-01-10 DIAGNOSIS — C787 Secondary malignant neoplasm of liver and intrahepatic bile duct: Secondary | ICD-10-CM | POA: Diagnosis not present

## 2018-01-10 DIAGNOSIS — D649 Anemia, unspecified: Secondary | ICD-10-CM | POA: Diagnosis present

## 2018-01-10 DIAGNOSIS — C7951 Secondary malignant neoplasm of bone: Secondary | ICD-10-CM | POA: Diagnosis present

## 2018-01-10 DIAGNOSIS — Z8619 Personal history of other infectious and parasitic diseases: Secondary | ICD-10-CM

## 2018-01-10 DIAGNOSIS — E669 Obesity, unspecified: Secondary | ICD-10-CM | POA: Diagnosis present

## 2018-01-10 DIAGNOSIS — Z8719 Personal history of other diseases of the digestive system: Secondary | ICD-10-CM

## 2018-01-10 DIAGNOSIS — E1165 Type 2 diabetes mellitus with hyperglycemia: Secondary | ICD-10-CM | POA: Diagnosis present

## 2018-01-10 DIAGNOSIS — D696 Thrombocytopenia, unspecified: Secondary | ICD-10-CM | POA: Diagnosis present

## 2018-01-10 DIAGNOSIS — E119 Type 2 diabetes mellitus without complications: Secondary | ICD-10-CM

## 2018-01-10 DIAGNOSIS — I10 Essential (primary) hypertension: Secondary | ICD-10-CM | POA: Diagnosis present

## 2018-01-10 DIAGNOSIS — E43 Unspecified severe protein-calorie malnutrition: Secondary | ICD-10-CM | POA: Diagnosis present

## 2018-01-10 DIAGNOSIS — R601 Generalized edema: Secondary | ICD-10-CM

## 2018-01-10 DIAGNOSIS — K219 Gastro-esophageal reflux disease without esophagitis: Secondary | ICD-10-CM | POA: Diagnosis present

## 2018-01-10 DIAGNOSIS — Z6834 Body mass index (BMI) 34.0-34.9, adult: Secondary | ICD-10-CM

## 2018-01-10 DIAGNOSIS — Z880 Allergy status to penicillin: Secondary | ICD-10-CM

## 2018-01-10 DIAGNOSIS — I5032 Chronic diastolic (congestive) heart failure: Secondary | ICD-10-CM | POA: Diagnosis present

## 2018-01-10 DIAGNOSIS — Z79899 Other long term (current) drug therapy: Secondary | ICD-10-CM

## 2018-01-10 DIAGNOSIS — R109 Unspecified abdominal pain: Secondary | ICD-10-CM | POA: Diagnosis present

## 2018-01-10 DIAGNOSIS — Z7951 Long term (current) use of inhaled steroids: Secondary | ICD-10-CM

## 2018-01-10 DIAGNOSIS — Z800 Family history of malignant neoplasm of digestive organs: Secondary | ICD-10-CM

## 2018-01-10 DIAGNOSIS — R180 Malignant ascites: Secondary | ICD-10-CM | POA: Diagnosis present

## 2018-01-10 DIAGNOSIS — R740 Nonspecific elevation of levels of transaminase and lactic acid dehydrogenase [LDH]: Secondary | ICD-10-CM | POA: Diagnosis present

## 2018-01-10 DIAGNOSIS — T451X5A Adverse effect of antineoplastic and immunosuppressive drugs, initial encounter: Secondary | ICD-10-CM | POA: Diagnosis present

## 2018-01-10 DIAGNOSIS — Z96651 Presence of right artificial knee joint: Secondary | ICD-10-CM | POA: Diagnosis present

## 2018-01-10 DIAGNOSIS — D61810 Antineoplastic chemotherapy induced pancytopenia: Secondary | ICD-10-CM | POA: Diagnosis present

## 2018-01-10 DIAGNOSIS — E8809 Other disorders of plasma-protein metabolism, not elsewhere classified: Secondary | ICD-10-CM | POA: Diagnosis present

## 2018-01-10 DIAGNOSIS — Z888 Allergy status to other drugs, medicaments and biological substances status: Secondary | ICD-10-CM

## 2018-01-10 DIAGNOSIS — I110 Hypertensive heart disease with heart failure: Secondary | ICD-10-CM | POA: Diagnosis present

## 2018-01-10 LAB — URINALYSIS, ROUTINE W REFLEX MICROSCOPIC
BILIRUBIN URINE: NEGATIVE
Glucose, UA: 50 mg/dL — AB
Hgb urine dipstick: NEGATIVE
KETONES UR: NEGATIVE mg/dL
Leukocytes, UA: NEGATIVE
NITRITE: NEGATIVE
PROTEIN: NEGATIVE mg/dL
SPECIFIC GRAVITY, URINE: 1.011 (ref 1.005–1.030)
pH: 5 (ref 5.0–8.0)

## 2018-01-10 LAB — LIPASE, BLOOD: Lipase: 49 U/L (ref 11–51)

## 2018-01-10 LAB — AMMONIA: AMMONIA: 54 umol/L — AB (ref 9–35)

## 2018-01-10 LAB — CBC WITH DIFFERENTIAL/PLATELET
BASOS ABS: 0 10*3/uL (ref 0.0–0.1)
Basophils Relative: 0 %
EOS ABS: 0 10*3/uL (ref 0.0–0.7)
EOS PCT: 0 %
HCT: 28.1 % — ABNORMAL LOW (ref 39.0–52.0)
Hemoglobin: 9.3 g/dL — ABNORMAL LOW (ref 13.0–17.0)
LYMPHS PCT: 22 %
Lymphs Abs: 1.1 10*3/uL (ref 0.7–4.0)
MCH: 34.1 pg — ABNORMAL HIGH (ref 26.0–34.0)
MCHC: 33.1 g/dL (ref 30.0–36.0)
MCV: 102.9 fL — ABNORMAL HIGH (ref 78.0–100.0)
Monocytes Absolute: 0 10*3/uL — ABNORMAL LOW (ref 0.1–1.0)
Monocytes Relative: 1 %
Neutro Abs: 3.7 10*3/uL (ref 1.7–7.7)
Neutrophils Relative %: 77 %
PLATELETS: 122 10*3/uL — AB (ref 150–400)
RBC: 2.73 MIL/uL — AB (ref 4.22–5.81)
RDW: 18.3 % — ABNORMAL HIGH (ref 11.5–15.5)
WBC: 4.9 10*3/uL (ref 4.0–10.5)

## 2018-01-10 LAB — COMPREHENSIVE METABOLIC PANEL
ALT: 29 U/L (ref 0–44)
AST: 40 U/L (ref 15–41)
Albumin: 2.5 g/dL — ABNORMAL LOW (ref 3.5–5.0)
Alkaline Phosphatase: 82 U/L (ref 38–126)
Anion gap: 11 (ref 5–15)
BUN: 24 mg/dL — AB (ref 8–23)
CHLORIDE: 101 mmol/L (ref 98–111)
CO2: 25 mmol/L (ref 22–32)
CREATININE: 0.85 mg/dL (ref 0.61–1.24)
Calcium: 8.3 mg/dL — ABNORMAL LOW (ref 8.9–10.3)
GFR calc Af Amer: >60 mL/min (ref >60)
GFR calc non Af Amer: >60 mL/min (ref >60)
Glucose, Bld: 209 mg/dL — ABNORMAL HIGH (ref 70–99)
POTASSIUM: 4.4 mmol/L (ref 3.5–5.1)
SODIUM: 137 mmol/L (ref 135–145)
Total Bilirubin: 1.3 mg/dL — ABNORMAL HIGH (ref 0.3–1.2)
Total Protein: 6.5 g/dL (ref 6.5–8.1)

## 2018-01-10 LAB — I-STAT CG4 LACTIC ACID, ED: Lactic Acid, Venous: 2.72 mmol/L (ref 0.5–1.9)

## 2018-01-10 LAB — PROTIME-INR
INR: 1.04
Prothrombin Time: 13.5 seconds (ref 11.4–15.2)

## 2018-01-10 LAB — BRAIN NATRIURETIC PEPTIDE: B NATRIURETIC PEPTIDE 5: 90.1 pg/mL (ref 0.0–100.0)

## 2018-01-10 NOTE — ED Triage Notes (Signed)
Pt has gained 14.8 lbs over the past 3 days. Pt's oncologist recommended pt come to ED. Pt had pleurocentesis  2-3 weeks ago.

## 2018-01-10 NOTE — ED Notes (Addendum)
Lab contacted to collect blood from pt due to being a difficult stick and IV not pulling back. Needed a recollect on tubes.

## 2018-01-10 NOTE — ED Notes (Signed)
Staff member of lab at bedside collecting blood [phone]

## 2018-01-10 NOTE — ED Notes (Signed)
Patient transported to X-ray 

## 2018-01-10 NOTE — ED Notes (Signed)
Pt c/o of how long he has been in the ED stating he wants to leave if he isn't going to be getting a bed upstairs soon.

## 2018-01-10 NOTE — ED Notes (Addendum)
Notified EDP,Tegeler,MD. Pt. I-stat Lactic acid results 2.72 and RN,Sarah,F. Made aware.

## 2018-01-10 NOTE — ED Notes (Addendum)
Pt and family becoming increasingly agitated toward staff members about having to wait to see the doctor stating "yall must have forgotten about me" and "I cant believe I had to wait two hours in that stupid waiting room."

## 2018-01-10 NOTE — Telephone Encounter (Signed)
Pt daughter called states "Daddy has gained 14 pounds this week and wont go tot he ED. He's having difficulty breathing and hates going to the ED."  Recommended pt proceed to ED as he will need fluid drained. Breathing will continue to worsen.[name] requested I call pt at home and discuss. Called pt, explained he should go to ED, pt states " I know. But I'm coming over there for a shot tomorrow anyway. I always end up in the hospital after treatment." Again explained to pt importance of having fluid drained from abdomen, daughter mentioned he had over 10lbs of weight gain in 4 days. Pt states "I will think about it" No further concerns

## 2018-01-10 NOTE — Progress Notes (Signed)
   This provider was asked to see the patient as he was receiving an infusion of carboplatin.  He was noted to have an area of mild erythema proximal to his IV site but in an area that appeared to be in a different distribution than where his IV was placed.  The area was cool to the touch and was non-tender.  The patient did report mild stinging at the IV site.  A warm compress was placed over the area.  The patient was advised to return as needed.  He expressed understanding with this plan.  This picture was reviewed with [name] with the plan of care formulated by him.

## 2018-01-10 NOTE — ED Provider Notes (Signed)
[place] DEPT Provider Note   CSN: [id number] Arrival date & time: 01/10/18  1855     History   Chief Complaint Chief Complaint  Patient presents with  . Abdominal Pain  . abdominal swelling    HPI Robert Compton is a 72 y.o. male.  The history is provided by the patient, a relative and medical records. No language interpreter was used.  Abdominal Pain   This is a new problem. The current episode started more than 2 days ago. The problem occurs constantly. The problem has been rapidly worsening. The pain is associated with an unknown factor. The pain is located in the generalized abdominal region. The quality of the pain is aching. The pain is mild. Pertinent negatives include fever, diarrhea, nausea, vomiting, constipation, dysuria and frequency. The symptoms are aggravated by palpation. Nothing relieves the symptoms.    Past Medical History:  Diagnosis Date  . Arthritis   . Cancer ([place])   . Diabetes mellitus without complication ([place])   . Hypertension     Patient Active Problem List   Diagnosis Date Noted  . Malnutrition of moderate degree 12/20/2017  . SBP (spontaneous bacterial peritonitis) ([place])   . Abdominal distention   . Hypervolemia   . Edema 12/13/2017  . Severe protein-calorie malnutrition ([place]) 12/13/2017  . Abnormal liver function 12/13/2017  . Leukocytosis 12/13/2017  . Acute on chronic diastolic (congestive) heart failure ([place]) 11/27/2017  . Tachycardia 11/26/2017  . Anemia 11/26/2017  . Leg edema, right 11/26/2017  . Dehydration   . Pressure injury of skin 11/09/2017  . Hyperbilirubinemia   . Liver metastasis ([place])   . Neutropenic fever ([place])   . Pancytopenia ([place])   . Febrile neutropenia ([place]) 11/08/2017  . Severe sepsis ([place]) 11/08/2017  . Antineoplastic chemotherapy induced pancytopenia ([place]) 11/08/2017  . AKI (acute kidney injury) ([place]) 11/08/2017  . Encounter for antineoplastic chemotherapy   . Extensive  stage primary small cell carcinoma of lung ([place]) 10/03/2017  . Liver masses   . Mass of upper lobe of right lung 09/28/2017  . Hyponatremia 09/27/2017  . Failure to thrive in adult 09/27/2017  . Metastatic cancer to liver ([place]) 09/27/2017  . DM (diabetes mellitus), type 2 ([place]) 09/27/2017  . Essential hypertension 09/27/2017    Past Surgical History:  Procedure Laterality Date  . HERNIA REPAIR    . JOINT REPLACEMENT Right    knee  . LIVER BIOPSY          Home Medications    Prior to Admission medications   Medication Sig Start Date End Date Taking? Authorizing Provider  carvedilol (COREG) 3.125 MG tablet Take 1 tablet (3.125 mg total) by mouth 2 (two) times daily with a meal. 12/24/17  Yes Mariel Aloe, MD  CINNAMON PO Take 600 mg by mouth 2 (two) times daily.   Yes [provider]  Cyanocobalamin (VITAMIN B-12 PO) Take 1 tablet by mouth daily.   Yes [provider]  fish oil-omega-3 fatty acids 1000 MG capsule Take 1 g by mouth 2 (two) times daily.   Yes [provider]  fluticasone (FLONASE) 50 MCG/ACT nasal spray Place 2 sprays into both nostrils daily as needed for allergies. 11/12/17  Yes Eugenie Filler, MD  furosemide (LASIX) 40 MG tablet Take 40 mg by mouth 2 (two) times daily.  01/06/18  Yes [provider]  loratadine (CLARITIN) 10 MG tablet Take 1 tablet (10 mg total) by mouth daily. 11/13/17  Yes Irine Seal  V, MD  metFORMIN (GLUCOPHAGE) 1000 MG tablet Take 1,000 mg by mouth 2 (two) times daily with a meal.   Yes [provider]  NICOTINE STEP 2 14 MG/24HR patch Place 14 mg onto the skin daily.  01/06/18  Yes [provider]  omeprazole (PRILOSEC) 20 MG capsule Take 20 mg by mouth daily.   Yes [provider]  Potassium Chloride ER 20 MEQ TBCR Take 20 mEq by mouth 2 (two) times daily. 12/02/17  Yes Dessa Phi, DO  Red Yeast Rice 600 MG TABS Take 1 tablet by mouth 2 (two) times daily.    Yes  [provider]  furosemide (LASIX) 20 MG tablet Take 2 tablets (40 mg total) by mouth 2 (two) times daily. Patient not taking: Reported on 01/10/2018 12/02/17   Dessa Phi, DO  insulin aspart (NOVOLOG) 100 UNIT/ML injection CBG < 70: implement hypoglycemia protocol CBG 70 - 120: 0 units CBG 121 - 150: 2 units CBG 151 - 200: 3 units CBG 201 - 250: 5 units CBG 251 - 300: 8 units CBG 301 - 350: 11 units CBG 351 - 400: 15 units CBG > 400: call MD 10/08/17   Lavina Hamman, MD  nicotine (NICODERM CQ - DOSED IN MG/24 HOURS) 21 mg/24hr patch Place 1 patch (21 mg total) onto the skin daily. Patient not taking: Reported on 01/10/2018 11/12/17   Eugenie Filler, MD  polyethylene glycol [facility] / Floria Raveling) packet Take 17 g by mouth daily. Patient taking differently: Take 17 g by mouth daily as needed for moderate constipation.  10/09/17   Lavina Hamman, MD  traMADol (ULTRAM) 50 MG tablet Take 1 tablet (50 mg total) by mouth every 6 (six) hours as needed for moderate pain. Patient not taking: Reported on 12/09/2017 10/08/17   Lavina Hamman, MD    Family History Family History  Problem Relation Age of Onset  . Diabetes Mother   . Dementia Mother   . Colon cancer Father   . CAD Neg Hx     Social History Social History   Tobacco Use  . Smoking status: Former Smoker    Types: Cigarettes    Last attempt to quit: 09/20/2017    Years since quitting: 0.3  . Smokeless tobacco: Never Used  Substance Use Topics  . Alcohol use: No  . Drug use: No     Allergies   Amoxicillin-pot clavulanate and Nitrofurantoin   Review of Systems Review of Systems  Constitutional: Positive for fatigue. Negative for chills, diaphoresis and fever.  HENT: Negative for congestion.   Eyes: Negative for visual disturbance.  Respiratory: Negative for cough, chest tightness, shortness of breath and wheezing.   Cardiovascular: Positive for leg swelling. Negative for chest pain and palpitations.    Gastrointestinal: Positive for abdominal distention and abdominal pain. Negative for constipation, diarrhea, nausea and vomiting.  Genitourinary: Negative for dysuria, flank pain and frequency.  Musculoskeletal: Negative for back pain, neck pain and neck stiffness.  Neurological: Negative for light-headedness and numbness.  Psychiatric/Behavioral: Negative for agitation and confusion.  All other systems reviewed and are negative.    Physical Exam Updated Vital Signs BP 125/72 (BP Location: Right Arm)   Pulse 88   Temp 97.9 F (36.6 C) (Oral)   Resp (!) 23   SpO2 100%   Physical Exam  Constitutional: He is oriented to person, place, and time. He appears well-developed and well-nourished.  Non-toxic appearance. He does not appear ill. No distress.  HENT:  Head: Normocephalic and atraumatic.  Mouth/Throat: Oropharynx is clear and moist.  Eyes: Pupils are equal, round, and reactive to light. Conjunctivae and EOM are normal.  Neck: Normal range of motion. Neck supple.  Cardiovascular: Normal rate and regular rhythm.  No murmur heard. Pulmonary/Chest: Effort normal. No respiratory distress. He has no wheezes. He has rales. He exhibits no tenderness.  Abdominal: Bowel sounds are normal. He exhibits distension. There is tenderness. There is no rigidity, no rebound and no CVA tenderness.  Musculoskeletal: He exhibits no edema or tenderness.  Neurological: He is alert and oriented to person, place, and time. No sensory deficit. He exhibits normal muscle tone.  Skin: Skin is warm and dry. He is not diaphoretic. No erythema. No pallor.  Psychiatric: He has a normal mood and affect.  Nursing note and vitals reviewed.    ED Treatments / Results  Labs (all labs ordered are listed, but only abnormal results are displayed) Labs Reviewed  URINALYSIS, ROUTINE W REFLEX MICROSCOPIC - Abnormal; Notable for the following components:      Result Value   Glucose, UA 50 (*)    All other  components within normal limits  CBC WITH DIFFERENTIAL/PLATELET - Abnormal; Notable for the following components:   RBC 2.73 (*)    Hemoglobin 9.3 (*)    HCT 28.1 (*)    MCV 102.9 (*)    MCH 34.1 (*)    RDW 18.3 (*)    Platelets 122 (*)    Monocytes Absolute 0.0 (*)    All other components within normal limits  COMPREHENSIVE METABOLIC PANEL - Abnormal; Notable for the following components:   Glucose, Bld 209 (*)    BUN 24 (*)    Calcium 8.3 (*)    Albumin 2.5 (*)    Total Bilirubin 1.3 (*)    All other components within normal limits  AMMONIA - Abnormal; Notable for the following components:   Ammonia 54 (*)    All other components within normal limits  I-STAT CG4 LACTIC ACID, ED - Abnormal; Notable for the following components:   Lactic Acid, Venous 2.72 (*)    All other components within normal limits  URINE CULTURE  PROTIME-INR  BRAIN NATRIURETIC PEPTIDE  LIPASE, BLOOD  CBC WITH DIFFERENTIAL/PLATELET  I-STAT CG4 LACTIC ACID, ED    EKG EKG Interpretation  Date/Time:  Friday January 10 2018 21:46:19 EDT Ventricular Rate:  91 PR Interval:    QRS Duration: 98 QT Interval:  339 QTC Calculation: 417 R Axis:   -41 Text Interpretation:  Sinus rhythm Left axis deviation Low voltage, precordial leads Baseline wander in lead(s) V2 V3 V5 V6 when compaerd to prior, no significant changes seen,  No STEMI Confirmed by Antony Blackbird [phone]) on 01/11/2018 12:13:15 AM    Radiology Dg Chest 2 View  Result Date: 01/10/2018 CLINICAL DATA:  Fluid overload. EXAM: CHEST - 2 VIEW COMPARISON:  Radiograph of December 13, 2017. FINDINGS: The heart size and mediastinal contours are within normal limits. Both lungs are clear. No pneumothorax or pleural effusion is noted. The visualized skeletal structures are unremarkable. IMPRESSION: No active cardiopulmonary disease. Electronically Signed   By: Marijo Conception, M.D.   On: 01/10/2018 21:46    Procedures Procedures (including critical care  time)  Medications Ordered in ED Medications - No data to display   Initial Impression / Assessment and Plan / ED Course  I have reviewed the triage vital signs and the nursing notes.  Pertinent labs & imaging results that  were available during my care of the patient were reviewed by me and considered in my medical decision making (see chart for details).     Robert Compton is a 72 y.o. male with a past medical history significant for diabetes, hypertension, small cell adenocarcinoma with metastasis to liver currently on chemotherapy who presents with fatigue, rapidly worsening peripheral edema and abdominal distention.  Patient reports that he had to be admitted several weeks ago for similar symptoms of anasarca and abdominal swelling with fluid.  He reports that over the last 3 days since receiving chemotherapy, he has put on 14.8 pounds.  He is feeling very fatigued and tired.  He is denying chest pain or shortness of breath at this time but is concerned about the fluid worsening.  He reports his legs were nonedematous 3 days ago but are now extremely swollen bilaterally.  He denies any leg pains.  He reports some abdominal discomfort as it is stretching.  He is unsure when asked about history of SBP but denies any current fevers or chills.  He denies any cough, congestion, or palpitations.  He denies nausea or vomiting.  His primary concern is the rapidly worsening fluid overload.  On exam, patient's lungs have faint crackles in the bases.  Abdomen is mildly tender diffusely.  No focal tenderness.  Abdomen is distended.  Back is nontender.  No CVA tenderness.  Legs are edematous bilaterally with normal sensation and strength.  Normal pulses distally.  Clinically I am very concerned about either CHF worsening versus anasarca from liver failure and ascites.  May be related to recent chemo several days.    Given his lack of fevers, chills, or severe abdominal pain have lower suspicion for SBP.  I  suspect patient is having symptomatic ascites and generalized edema.  Patient had screening laboratory testing including ammonia that was slightly elevated.  No leukocytosis.  Mild anemia improved from prior.  Lactic acid was elevated at 2.72.  INR is normal.  Lipase not elevated.  Metabolic panel shows normal kidney function and liver function.  Urinalysis does not show infection.  Chest x-ray reassuring.  BNP was not elevated however I am concerned that this is falsely low.   With the rapidly worsening weight gain and fluid retention, I feel patient needs admission for monitoring of his fluids for continued diuresis, and for likely therapeutic paracentesis.  Patient feels that his mouth is very dry and he feels dehydrated despite looking fluid overloaded.  Patient will be called for admission for further fluid monitoring with hydration, diuresis, and likely inpatient paracentesis.      Final Clinical Impressions(s) / ED Diagnoses   Final diagnoses:  Abdominal distension  Hypervolemia, unspecified hypervolemia type    ED Discharge Orders    None     Clinical Impression: 1. Abdominal distension   2. Hypervolemia, unspecified hypervolemia type     Disposition: Admit  This note was prepared with assistance of Dragon voice recognition software. Occasional wrong-word or sound-a-like substitutions may have occurred due to the inherent limitations of voice recognition software.     Kitana Gage, Gwenyth Allegra, MD 01/11/18 [phone]

## 2018-01-11 ENCOUNTER — Observation Stay (HOSPITAL_COMMUNITY): Payer: BLUE CROSS/BLUE SHIELD

## 2018-01-11 ENCOUNTER — Inpatient Hospital Stay: Payer: BLUE CROSS/BLUE SHIELD

## 2018-01-11 DIAGNOSIS — E119 Type 2 diabetes mellitus without complications: Secondary | ICD-10-CM

## 2018-01-11 DIAGNOSIS — R109 Unspecified abdominal pain: Secondary | ICD-10-CM | POA: Diagnosis not present

## 2018-01-11 DIAGNOSIS — R7989 Other specified abnormal findings of blood chemistry: Secondary | ICD-10-CM | POA: Diagnosis present

## 2018-01-11 DIAGNOSIS — R601 Generalized edema: Secondary | ICD-10-CM | POA: Diagnosis not present

## 2018-01-11 DIAGNOSIS — E43 Unspecified severe protein-calorie malnutrition: Secondary | ICD-10-CM

## 2018-01-11 DIAGNOSIS — C787 Secondary malignant neoplasm of liver and intrahepatic bile duct: Principal | ICD-10-CM

## 2018-01-11 DIAGNOSIS — I5032 Chronic diastolic (congestive) heart failure: Secondary | ICD-10-CM | POA: Diagnosis present

## 2018-01-11 DIAGNOSIS — I10 Essential (primary) hypertension: Secondary | ICD-10-CM

## 2018-01-11 DIAGNOSIS — C3490 Malignant neoplasm of unspecified part of unspecified bronchus or lung: Secondary | ICD-10-CM

## 2018-01-11 DIAGNOSIS — D61810 Antineoplastic chemotherapy induced pancytopenia: Secondary | ICD-10-CM

## 2018-01-11 DIAGNOSIS — D649 Anemia, unspecified: Secondary | ICD-10-CM

## 2018-01-11 DIAGNOSIS — T451X5A Adverse effect of antineoplastic and immunosuppressive drugs, initial encounter: Secondary | ICD-10-CM

## 2018-01-11 LAB — BODY FLUID CELL COUNT WITH DIFFERENTIAL
Lymphs, Fluid: 40 %
MONOCYTE-MACROPHAGE-SEROUS FLUID: 43 % — AB (ref 50–90)
Neutrophil Count, Fluid: 17 % (ref 0–25)
WBC FLUID: 269 uL (ref 0–1000)

## 2018-01-11 LAB — CBC
HEMATOCRIT: 24.9 % — AB (ref 39.0–52.0)
HEMOGLOBIN: 8.1 g/dL — AB (ref 13.0–17.0)
MCH: 33.5 pg (ref 26.0–34.0)
MCHC: 32.5 g/dL (ref 30.0–36.0)
MCV: 102.9 fL — ABNORMAL HIGH (ref 78.0–100.0)
Platelets: 111 10*3/uL — ABNORMAL LOW (ref 150–400)
RBC: 2.42 MIL/uL — ABNORMAL LOW (ref 4.22–5.81)
RDW: 18.1 % — AB (ref 11.5–15.5)
WBC: 4.1 10*3/uL (ref 4.0–10.5)

## 2018-01-11 LAB — ALBUMIN, PLEURAL OR PERITONEAL FLUID

## 2018-01-11 LAB — GLUCOSE, PLEURAL OR PERITONEAL FLUID: GLUCOSE FL: 199 mg/dL

## 2018-01-11 LAB — PROTEIN, PLEURAL OR PERITONEAL FLUID

## 2018-01-11 LAB — COMPREHENSIVE METABOLIC PANEL
ALBUMIN: 2.7 g/dL — AB (ref 3.5–5.0)
ALT: 26 U/L (ref 0–44)
AST: 31 U/L (ref 15–41)
Alkaline Phosphatase: 73 U/L (ref 38–126)
Anion gap: 9 (ref 5–15)
BILIRUBIN TOTAL: 1.5 mg/dL — AB (ref 0.3–1.2)
BUN: 25 mg/dL — AB (ref 8–23)
CO2: 27 mmol/L (ref 22–32)
Calcium: 8.2 mg/dL — ABNORMAL LOW (ref 8.9–10.3)
Chloride: 103 mmol/L (ref 98–111)
Creatinine, Ser: 0.78 mg/dL (ref 0.61–1.24)
GFR calc Af Amer: >60 mL/min (ref >60)
GLUCOSE: 155 mg/dL — AB (ref 70–99)
POTASSIUM: 4.2 mmol/L (ref 3.5–5.1)
Sodium: 139 mmol/L (ref 135–145)
TOTAL PROTEIN: 6.2 g/dL — AB (ref 6.5–8.1)

## 2018-01-11 LAB — GLUCOSE, CAPILLARY
GLUCOSE-CAPILLARY: 166 mg/dL — AB (ref 70–99)
GLUCOSE-CAPILLARY: 222 mg/dL — AB (ref 70–99)
Glucose-Capillary: 160 mg/dL — ABNORMAL HIGH (ref 70–99)
Glucose-Capillary: 190 mg/dL — ABNORMAL HIGH (ref 70–99)
Glucose-Capillary: 203 mg/dL — ABNORMAL HIGH (ref 70–99)

## 2018-01-11 LAB — LACTATE DEHYDROGENASE, PLEURAL OR PERITONEAL FLUID: LD, Fluid: 37 U/L — ABNORMAL HIGH (ref 3–23)

## 2018-01-11 LAB — APTT: aPTT: 27 seconds (ref 24–36)

## 2018-01-11 LAB — PROCALCITONIN: PROCALCITONIN: 0.28 ng/mL

## 2018-01-11 MED ORDER — VITAMIN B-12 100 MCG PO TABS
100.0000 ug | ORAL_TABLET | Freq: Every day | ORAL | Status: DC
  Administered 2018-01-11 – 2018-01-18 (×8): 100 ug via ORAL
  Filled 2018-01-11 (×8): qty 1

## 2018-01-11 MED ORDER — OXYCODONE HCL 5 MG PO TABS
5.0000 mg | ORAL_TABLET | Freq: Four times a day (QID) | ORAL | Status: DC | PRN
  Administered 2018-01-12 – 2018-01-17 (×4): 5 mg via ORAL
  Filled 2018-01-11 (×6): qty 1

## 2018-01-11 MED ORDER — ONDANSETRON HCL 4 MG PO TABS: 4.0000 mg | ORAL_TABLET | Freq: Four times a day (QID) | ORAL | Status: DC | PRN

## 2018-01-11 MED ORDER — CINNAMON 500 MG PO CAPS: 600.0000 mg | ORAL_CAPSULE | Freq: Two times a day (BID) | ORAL | Status: DC

## 2018-01-11 MED ORDER — ALBUMIN HUMAN 25 % IV SOLN
100.0000 g | Freq: Once | INTRAVENOUS | Status: AC
  Administered 2018-01-11: 100 g via INTRAVENOUS
  Filled 2018-01-11: qty 400

## 2018-01-11 MED ORDER — POLYETHYLENE GLYCOL 3350 17 G PO PACK: 17.0000 g | PACK | Freq: Every day | ORAL | Status: DC | PRN

## 2018-01-11 MED ORDER — OMEGA-3-ACID ETHYL ESTERS 1 G PO CAPS
1.0000 g | ORAL_CAPSULE | Freq: Two times a day (BID) | ORAL | Status: DC
  Administered 2018-01-11 – 2018-01-18 (×15): 1 g via ORAL
  Filled 2018-01-11 (×15): qty 1

## 2018-01-11 MED ORDER — PANTOPRAZOLE SODIUM 40 MG PO TBEC
40.0000 mg | DELAYED_RELEASE_TABLET | Freq: Every day | ORAL | Status: DC
  Administered 2018-01-11 – 2018-01-18 (×8): 40 mg via ORAL
  Filled 2018-01-11 (×8): qty 1

## 2018-01-11 MED ORDER — FUROSEMIDE 10 MG/ML IJ SOLN
40.0000 mg | Freq: Two times a day (BID) | INTRAMUSCULAR | Status: DC
  Administered 2018-01-11 – 2018-01-12 (×3): 40 mg via INTRAVENOUS
  Filled 2018-01-11 (×3): qty 4

## 2018-01-11 MED ORDER — CARVEDILOL 3.125 MG PO TABS
3.1250 mg | ORAL_TABLET | Freq: Two times a day (BID) | ORAL | Status: DC
  Administered 2018-01-11 – 2018-01-18 (×15): 3.125 mg via ORAL
  Filled 2018-01-11 (×15): qty 1

## 2018-01-11 MED ORDER — ZOLPIDEM TARTRATE 5 MG PO TABS: 5.0000 mg | ORAL_TABLET | Freq: Every evening | ORAL | Status: DC | PRN

## 2018-01-11 MED ORDER — SENNOSIDES-DOCUSATE SODIUM 8.6-50 MG PO TABS: 1.0000 | ORAL_TABLET | Freq: Every evening | ORAL | Status: DC | PRN

## 2018-01-11 MED ORDER — LORATADINE 10 MG PO TABS
10.0000 mg | ORAL_TABLET | Freq: Every day | ORAL | Status: DC
  Administered 2018-01-11 – 2018-01-18 (×8): 10 mg via ORAL
  Filled 2018-01-11 (×8): qty 1

## 2018-01-11 MED ORDER — CIPROFLOXACIN IN D5W 400 MG/200ML IV SOLN
400.0000 mg | Freq: Two times a day (BID) | INTRAVENOUS | Status: AC
  Administered 2018-01-11 – 2018-01-15 (×11): 400 mg via INTRAVENOUS
  Filled 2018-01-11 (×11): qty 200

## 2018-01-11 MED ORDER — RED YEAST RICE 600 MG PO TABS: 1.0000 | ORAL_TABLET | Freq: Two times a day (BID) | ORAL | Status: DC

## 2018-01-11 MED ORDER — LIDOCAINE HCL 1 % IJ SOLN
INTRAMUSCULAR | Status: AC
  Filled 2018-01-11: qty 10

## 2018-01-11 MED ORDER — FLUTICASONE PROPIONATE 50 MCG/ACT NA SUSP
2.0000 | Freq: Every day | NASAL | Status: DC | PRN
  Filled 2018-01-11: qty 16

## 2018-01-11 MED ORDER — INSULIN ASPART 100 UNIT/ML ~~LOC~~ SOLN
0.0000 [IU] | Freq: Three times a day (TID) | SUBCUTANEOUS | Status: DC
  Administered 2018-01-11: 2 [IU] via SUBCUTANEOUS
  Administered 2018-01-11: 3 [IU] via SUBCUTANEOUS
  Administered 2018-01-11: 2 [IU] via SUBCUTANEOUS
  Administered 2018-01-12: 3 [IU] via SUBCUTANEOUS
  Administered 2018-01-12: 2 [IU] via SUBCUTANEOUS
  Administered 2018-01-13: 3 [IU] via SUBCUTANEOUS
  Administered 2018-01-13 (×2): 2 [IU] via SUBCUTANEOUS
  Administered 2018-01-14 (×2): 3 [IU] via SUBCUTANEOUS
  Administered 2018-01-14: 2 [IU] via SUBCUTANEOUS
  Administered 2018-01-15: 9 [IU] via SUBCUTANEOUS
  Administered 2018-01-15 (×2): 2 [IU] via SUBCUTANEOUS
  Administered 2018-01-16: 3 [IU] via SUBCUTANEOUS
  Administered 2018-01-16: 5 [IU] via SUBCUTANEOUS
  Administered 2018-01-16: 3 [IU] via SUBCUTANEOUS
  Administered 2018-01-17: 5 [IU] via SUBCUTANEOUS
  Administered 2018-01-17 – 2018-01-18 (×3): 2 [IU] via SUBCUTANEOUS

## 2018-01-11 MED ORDER — SODIUM CHLORIDE 0.9 % IV SOLN
INTRAVENOUS | Status: DC | PRN
  Administered 2018-01-11 – 2018-01-12 (×2): 250 mL via INTRAVENOUS
  Administered 2018-01-14 – 2018-01-15 (×2): 500 mL via INTRAVENOUS

## 2018-01-11 MED ORDER — ONDANSETRON HCL 4 MG/2ML IJ SOLN
4.0000 mg | Freq: Four times a day (QID) | INTRAMUSCULAR | Status: DC | PRN
  Administered 2018-01-13: 4 mg via INTRAVENOUS
  Filled 2018-01-11: qty 2

## 2018-01-11 MED ORDER — INSULIN ASPART 100 UNIT/ML ~~LOC~~ SOLN
0.0000 [IU] | Freq: Every day | SUBCUTANEOUS | Status: DC
  Administered 2018-01-11 – 2018-01-12 (×2): 2 [IU] via SUBCUTANEOUS
  Administered 2018-01-13: 4 [IU] via SUBCUTANEOUS
  Administered 2018-01-14 – 2018-01-16 (×2): 2 [IU] via SUBCUTANEOUS
  Administered 2018-01-17: 5 [IU] via SUBCUTANEOUS

## 2018-01-11 MED ORDER — HYDRALAZINE HCL 20 MG/ML IJ SOLN: 5.0000 mg | INTRAMUSCULAR | Status: DC | PRN

## 2018-01-11 MED ORDER — NICOTINE 14 MG/24HR TD PT24
14.0000 mg | MEDICATED_PATCH | Freq: Every day | TRANSDERMAL | Status: DC
  Administered 2018-01-11 – 2018-01-18 (×8): 14 mg via TRANSDERMAL
  Filled 2018-01-11 (×8): qty 1

## 2018-01-11 NOTE — Progress Notes (Signed)
Pharmacy Antibiotic Note  Robert Compton is a 72 y.o. male admitted on 01/10/2018 with SBP.  Pharmacy has been consulted for Ciprofloxacin dosing.  Plan: Ciprofloxacin 400mg  iv q12hr  Height: 6\' 3"  (190.5 cm) Weight: 296 lb 14.4 oz (134.7 kg) IBW/kg (Calculated) : 84.5  Temp (24hrs), Avg:97.9 F (36.6 C), Min:97.9 F (36.6 C), Max:97.9 F (36.6 C)  Recent Labs  Lab 01/07/18 1303 01/10/18 2220 01/10/18 2256  WBC 5.8  --  4.9  CREATININE 0.91  --  0.85  LATICACIDVEN  --  2.72*  --     Estimated Creatinine Clearance: 119.6 mL/min (by C-G formula based on SCr of 0.85 mg/dL).    Allergies  Allergen Reactions  . Amoxicillin-Pot Clavulanate Anaphylaxis    Has patient had a PCN reaction causing immediate rash, facial/tongue/throat swelling, SOB or lightheadedness with hypotension: Yes Has patient had a PCN reaction causing severe rash involving mucus membranes or skin necrosis: Yes Has patient had a PCN reaction that required hospitalization: Yes Has patient had a PCN reaction occurring within the last 10 years: Yes If all of the above answers are "NO", then may proceed with Cephalosporin use.   . Nitrofurantoin Anaphylaxis    Antimicrobials this admission: Ciprofloxacin 01/11/2018 >>  Dose adjustments this admission: -  Microbiology results: -  Thank you for allowing pharmacy to be a part of this patient's care.  Nani Skillern Crowford 01/11/2018 3:50 AM

## 2018-01-11 NOTE — ED Notes (Signed)
ED TO INPATIENT HANDOFF REPORT  Name/Age/Gender Robert Compton 72 y.o. male  Code Status Code Status History    Date Active Date Inactive Code Status Order ID Comments User Context   12/14/2017 0005 12/24/2017 2208 Full Code [id number]  [name], MD ED   11/26/2017 2055 12/02/2017 1558 Full Code [id number]  [name], MD Inpatient   11/08/2017 2005 11/12/2017 1833 Full Code [id number]  [name], DO ED   09/27/2017 2346 10/08/2017 2258 Full Code [id number]  [name], MD Inpatient      Home/SNF/Other Home  Chief Complaint Cancer patient; fluid in abd/chemo  Level of Care/Admitting Diagnosis ED Disposition    ED Disposition Condition [place]: [facility] [[id number]]  Level of Care: Med-Surg [16]  Diagnosis: Anasarca [259563]  Admitting Physician: Ivor Costa [[id number]]  Attending Physician: Ivor Costa [[id number]]  PT Class (Do Not Modify): Observation [104]  PT Acc Code (Do Not Modify): Observation [10022]       Medical History Past Medical History:  Diagnosis Date  . Arthritis   . Cancer ([place])   . Diabetes mellitus without complication ([place])   . Hypertension     Allergies Allergies  Allergen Reactions  . Amoxicillin-Pot Clavulanate Anaphylaxis    Has patient had a PCN reaction causing immediate rash, facial/tongue/throat swelling, SOB or lightheadedness with hypotension: Yes Has patient had a PCN reaction causing severe rash involving mucus membranes or skin necrosis: Yes Has patient had a PCN reaction that required hospitalization: Yes Has patient had a PCN reaction occurring within the last 10 years: Yes If all of the above answers are "NO", then may proceed with Cephalosporin use.   . Nitrofurantoin Anaphylaxis    IV Location/Drains/Wounds Patient Lines/Drains/Airways Status   Active Line/Drains/Airways    Name:   Placement date:   Placement time:   Site:   Days:   Peripheral IV 01/10/18 Left Forearm   01/10/18     2200    Forearm   1   Incision (Closed) 09/30/17 Abdomen Right   09/30/17    2043     103   Pressure Injury 11/08/17 Stage II -  Partial thickness loss of dermis presenting as a shallow open ulcer with a red, pink wound bed without slough. two small .5 cm healing stage 2 on each side inner buttock   11/08/17    2200     64   Wound / Incision (Open or Dehisced) 10/08/17 Other (Comment) Buttocks Medial split in skin inn the crease of the buttocks   10/08/17    1010    Buttocks   95          Labs/Imaging Results for orders placed or performed during the hospital encounter of 01/10/18 (from the past 48 hour(s))  Protime-INR     Status: None   Collection Time: 01/10/18  9:40 PM  Result Value Ref Range   Prothrombin Time 13.5 11.4 - 15.2 seconds   INR 1.04     Comment: Performed at [facility], [place] [address], [place], [place] [address]  Urinalysis, Routine w reflex microscopic     Status: Abnormal   Collection Time: 01/10/18  9:40 PM  Result Value Ref Range   Color, Urine YELLOW YELLOW   APPearance CLEAR CLEAR   Specific Gravity, Urine 1.011 1.005 - 1.030   pH 5.0 5.0 - 8.0   Glucose, UA 50 (A) NEGATIVE mg/dL   Hgb urine dipstick NEGATIVE NEGATIVE  Bilirubin Urine NEGATIVE NEGATIVE   Ketones, ur NEGATIVE NEGATIVE mg/dL   Protein, ur NEGATIVE NEGATIVE mg/dL   Nitrite NEGATIVE NEGATIVE   Leukocytes, UA NEGATIVE NEGATIVE    Comment: Performed at [facility], [place] [address] [place], [place] [address]  I-Stat CG4 Lactic Acid, ED     Status: Abnormal   Collection Time: 01/10/18 10:20 PM  Result Value Ref Range   Lactic Acid, Venous 2.72 (HH) 0.5 - 1.9 mmol/L   Comment NOTIFIED PHYSICIAN   Brain natriuretic peptide     Status: None   Collection Time: 01/10/18 10:56 PM  Result Value Ref Range   B Natriuretic Peptide 90.1 0.0 - 100.0 pg/mL    Comment: Performed at [facility], [place] [address] [place], [place] [address]  CBC  with Differential/Platelet     Status: Abnormal   Collection Time: 01/10/18 10:56 PM  Result Value Ref Range   WBC 4.9 4.0 - 10.5 K/uL   RBC 2.73 (L) 4.22 - 5.81 MIL/uL   Hemoglobin 9.3 (L) 13.0 - 17.0 g/dL   HCT 28.1 (L) 39.0 - 52.0 %   MCV 102.9 (H) 78.0 - 100.0 fL   MCH 34.1 (H) 26.0 - 34.0 pg   MCHC 33.1 30.0 - 36.0 g/dL   RDW 18.3 (H) 11.5 - 15.5 %   Platelets 122 (L) 150 - 400 K/uL   Neutrophils Relative % 77 %   Neutro Abs 3.7 1.7 - 7.7 K/uL   Lymphocytes Relative 22 %   Lymphs Abs 1.1 0.7 - 4.0 K/uL   Monocytes Relative 1 %   Monocytes Absolute 0.0 (L) 0.1 - 1.0 K/uL   Eosinophils Relative 0 %   Eosinophils Absolute 0.0 0.0 - 0.7 K/uL   Basophils Relative 0 %   Basophils Absolute 0.0 0.0 - 0.1 K/uL    Comment: Performed at [facility], [place] [address] [place], [place] [address]  Comprehensive metabolic panel     Status: Abnormal   Collection Time: 01/10/18 10:56 PM  Result Value Ref Range   Sodium 137 135 - 145 mmol/L   Potassium 4.4 3.5 - 5.1 mmol/L   Chloride 101 98 - 111 mmol/L   CO2 25 22 - 32 mmol/L   Glucose, Bld 209 (H) 70 - 99 mg/dL   BUN 24 (H) 8 - 23 mg/dL   Creatinine, Ser 0.85 0.61 - 1.24 mg/dL   Calcium 8.3 (L) 8.9 - 10.3 mg/dL   Total Protein 6.5 6.5 - 8.1 g/dL   Albumin 2.5 (L) 3.5 - 5.0 g/dL   AST 40 15 - 41 U/L   ALT 29 0 - 44 U/L   Alkaline Phosphatase 82 38 - 126 U/L   Total Bilirubin 1.3 (H) 0.3 - 1.2 mg/dL   GFR calc non Af Amer >60 >60 mL/min   GFR calc Af Amer >60 >60 mL/min    Comment: (NOTE) The eGFR has been calculated using the CKD EPI equation. This calculation has not been validated in all clinical situations. eGFR's persistently <60 mL/min signify possible Chronic Kidney Disease.    Anion gap 11 5 - 15    Comment: Performed at [facility], [place] [address], [place], [place] [address]  Lipase, blood     Status: None   Collection Time: 01/10/18 10:56 PM  Result Value Ref Range   Lipase 49 11  - 51 U/L    Comment: Performed at [facility], [place] [address] [place],  [place] [address]  Ammonia     Status: Abnormal   Collection Time: 01/10/18 10:56 PM  Result Value Ref Range   Ammonia 54 (H) 9 - 35 umol/L    Comment: Performed at [facility], [place] [address], [place], [place] [address]   Dg Chest 2 View  Result Date: 01/10/2018 CLINICAL DATA:  Fluid overload. EXAM: CHEST - 2 VIEW COMPARISON:  Radiograph of December 13, 2017. FINDINGS: The heart size and mediastinal contours are within normal limits. Both lungs are clear. No pneumothorax or pleural effusion is noted. The visualized skeletal structures are unremarkable. IMPRESSION: No active cardiopulmonary disease. Electronically Signed   By: Marijo Conception, M.D.   On: 01/10/2018 21:46    Pending Labs Unresulted Labs (From admission, onward)    Start     Ordered   01/11/18 0118  Culture, blood (x 2)  BLOOD CULTURE X 2,   STAT    Comments:  INITIATE ANTIBIOTICS WITHIN 1 HOUR AFTER BLOOD CULTURES DRAWN.  If unable to obtain blood cultures, call MD immediately regarding antibiotic instructions.    01/11/18 0117   01/11/18 0118  Procalcitonin  STAT,   R     01/11/18 0117   01/11/18 0118  APTT  STAT,   R     01/11/18 0117   01/10/18 2121  CBC with Differential  STAT,   STAT     01/10/18 2121   01/10/18 2121  Urine culture  STAT,   STAT     01/10/18 2121          Vitals/Pain Today's Vitals   01/10/18 2105 01/10/18 2145 01/10/18 2319 01/11/18 0036  BP: 125/72 108/61 128/75 (!) 155/80  Pulse: 88 89 86 89  Resp: (!) 23 (!) 24 (!) 23 20  Temp:      TempSrc:      SpO2: 100% 99% 97% 100%  PainSc:        Isolation Precautions No active isolations  Medications Medications  carvedilol (COREG) tablet 3.125 mg (has no administration in time range)  nicotine (NICODERM CQ - dosed in mg/24 hours) patch 14 mg (has no administration in time range)  pantoprazole (PROTONIX) EC tablet 40 mg (has  no administration in time range)  polyethylene glycol (MIRALAX / GLYCOLAX) packet 17 g (has no administration in time range)  vitamin B-12 (CYANOCOBALAMIN) tablet 100 mcg (has no administration in time range)  fish oil-omega-3 fatty acids capsule 1 g (has no administration in time range)  fluticasone (FLONASE) 50 MCG/ACT nasal spray 2 spray (has no administration in time range)  loratadine (CLARITIN) tablet 10 mg (has no administration in time range)  albumin human 25 % solution 100 g (has no administration in time range)  oxyCODONE (Oxy IR/ROXICODONE) immediate release tablet 5 mg (has no administration in time range)  insulin aspart (novoLOG) injection 0-9 Units (has no administration in time range)  insulin aspart (novoLOG) injection 0-5 Units (has no administration in time range)  ciprofloxacin (CIPRO) IVPB 400 mg (has no administration in time range)    Mobility walks with person assist

## 2018-01-11 NOTE — Procedures (Signed)
PROCEDURE SUMMARY:  Successful US guided paracentesis from left lateral abdomen.  Yielded 5.2 liters of yellow fluid.  No immediate complications.  Pt tolerated well.   Specimen was sent for labs.  Docia Barrier PA-C 01/11/2018 12:32 PM

## 2018-01-11 NOTE — ED Notes (Signed)
Pt does not want to be stuck again for second I-stat lactic

## 2018-01-11 NOTE — ED Notes (Signed)
Hospitalist at bedside 

## 2018-01-11 NOTE — ED Notes (Addendum)
Rn on 6E aware that I-stat lactic was not drawn due to pt request

## 2018-01-11 NOTE — H&P (Signed)
History and Physical    Robert Compton WER:[id number] DOB: Jul 05, 1945 DOA: 01/10/2018  Referring MD/NP/PA:   PCP: [name], MD   Patient coming from:  The patient is coming from home.  At baseline, pt is independent for most of ADL.   Chief Complaint: Anasarca and abdominal pain  HPI: Robert Compton is a 72 y.o. male with medical history significant of metastatic small cell lung carcinoma to liver/ cervical spine, chemotherapy induced pancytopenia, diabetes mellitus, GERD, CHF, who presents with with anasarca and abdominal pain.  Patient was recently hospitalized from 8/30-9/10 because of anasarca and SBP. Patient had underwent therapeutic paracentesis in the treated with antibiotics. Patient states that he is taking Lasix 40 mg twice daily. Patient states that over the last 4 days since receiving chemotherapy, he has gained 14.8 pounds.  He has worsening abdominal distention and bilateral leg edema. He states that he has chronic mild right-sided abdominal pain, which has worsened today.  His abdominal pain is mainly located in the right side of abdomen, constant, 7 out of 10 severity, dull, nonradiating.  Patient denies nausea vomiting or diarrhea.  No fever or chills. He denies shortness of breath, chest pain, cough, symptoms of UTI or unilateral weakness.   ED Course: pt was found to have WBC 4.9, lactic acid 2.72, INR 1.04, negative urinalysis, BNP 90.1, lipase 49, electrolytes renal function okay, abnormal liver function (ALP 82, albumin 2.5, AST 40, ALT 29, total bilirubin 1.3), temperature normal, no tachycardia, has tachypnea, oxygen saturation 97% on room air.  Chest x-ray negative.  Patient is placed on MedSurg bed for observation.  Review of Systems:   General: no fevers, chills, has body weight gain, has fatigue HEENT: no blurry vision, hearing changes or sore throat Respiratory: no dyspnea, coughing, wheezing CV: no chest pain, no palpitations GI: no nausea, vomiting, has  abdominal pain and distention, no diarrhea, constipation GU: no dysuria, burning on urination, increased urinary frequency, hematuria  Ext: has leg edema Neuro: no unilateral weakness, numbness, or tingling, no vision change or hearing loss Skin: no rash, no skin tear. MSK: No muscle spasm, no deformity, no limitation of range of movement in spin Heme: No easy bruising.  Travel history: No recent long distant travel.  Allergy:  Allergies  Allergen Reactions  . Amoxicillin-Pot Clavulanate Anaphylaxis    Has patient had a PCN reaction causing immediate rash, facial/tongue/throat swelling, SOB or lightheadedness with hypotension: Yes Has patient had a PCN reaction causing severe rash involving mucus membranes or skin necrosis: Yes Has patient had a PCN reaction that required hospitalization: Yes Has patient had a PCN reaction occurring within the last 10 years: Yes If all of the above answers are "NO", then may proceed with Cephalosporin use.   . Nitrofurantoin Anaphylaxis    Past Medical History:  Diagnosis Date  . Arthritis   . Cancer ([place])   . Diabetes mellitus without complication ([place])   . Hypertension     Past Surgical History:  Procedure Laterality Date  . HERNIA REPAIR    . JOINT REPLACEMENT Right    knee  . LIVER BIOPSY      Social History:  reports that he quit smoking about 3 months ago. His smoking use included cigarettes. He has never used smokeless tobacco. He reports that he does not drink alcohol or use drugs.  Family History:  Family History  Problem Relation Age of Onset  . Diabetes Mother   . Dementia Mother   . Colon cancer Father   .  CAD Neg Hx      Prior to Admission medications   Medication Sig Start Date End Date Taking? Authorizing Provider  carvedilol (COREG) 3.125 MG tablet Take 1 tablet (3.125 mg total) by mouth 2 (two) times daily with a meal. 12/24/17  Yes Mariel Aloe, MD  CINNAMON PO Take 600 mg by mouth 2 (two) times daily.   Yes  [provider]  Cyanocobalamin (VITAMIN B-12 PO) Take 1 tablet by mouth daily.   Yes [provider]  fish oil-omega-3 fatty acids 1000 MG capsule Take 1 g by mouth 2 (two) times daily.   Yes [provider]  fluticasone (FLONASE) 50 MCG/ACT nasal spray Place 2 sprays into both nostrils daily as needed for allergies. 11/12/17  Yes Eugenie Filler, MD  furosemide (LASIX) 40 MG tablet Take 40 mg by mouth 2 (two) times daily.  01/06/18  Yes [provider]  loratadine (CLARITIN) 10 MG tablet Take 1 tablet (10 mg total) by mouth daily. 11/13/17  Yes Eugenie Filler, MD  metFORMIN (GLUCOPHAGE) 1000 MG tablet Take 1,000 mg by mouth 2 (two) times daily with a meal.   Yes [provider]  NICOTINE STEP 2 14 MG/24HR patch Place 14 mg onto the skin daily.  01/06/18  Yes [provider]  omeprazole (PRILOSEC) 20 MG capsule Take 20 mg by mouth daily.   Yes [provider]  Potassium Chloride ER 20 MEQ TBCR Take 20 mEq by mouth 2 (two) times daily. 12/02/17  Yes Dessa Phi, DO  Red Yeast Rice 600 MG TABS Take 1 tablet by mouth 2 (two) times daily.    Yes [provider]  furosemide (LASIX) 20 MG tablet Take 2 tablets (40 mg total) by mouth 2 (two) times daily. Patient not taking: Reported on 01/10/2018 12/02/17   Dessa Phi, DO  insulin aspart (NOVOLOG) 100 UNIT/ML injection CBG < 70: implement hypoglycemia protocol CBG 70 - 120: 0 units CBG 121 - 150: 2 units CBG 151 - 200: 3 units CBG 201 - 250: 5 units CBG 251 - 300: 8 units CBG 301 - 350: 11 units CBG 351 - 400: 15 units CBG > 400: call MD 10/08/17   Lavina Hamman, MD  nicotine (NICODERM CQ - DOSED IN MG/24 HOURS) 21 mg/24hr patch Place 1 patch (21 mg total) onto the skin daily. Patient not taking: Reported on 01/10/2018 11/12/17   Eugenie Filler, MD  polyethylene glycol [facility] Floria Raveling) packet Take 17 g by mouth daily. Patient taking differently: Take 17 g by mouth daily  as needed for moderate constipation.  10/09/17   Lavina Hamman, MD  traMADol (ULTRAM) 50 MG tablet Take 1 tablet (50 mg total) by mouth every 6 (six) hours as needed for moderate pain. Patient not taking: Reported on 12/09/2017 10/08/17   Lavina Hamman, MD    Physical Exam: Vitals:   01/10/18 2105 01/10/18 2145 01/10/18 2319 01/11/18 0036  BP: 125/72 108/61 128/75 (!) 155/80  Pulse: 88 89 86 89  Resp: (!) 23 (!) 24 (!) 23 20  Temp:      TempSrc:      SpO2: 100% 99% 97% 100%   General: Not in acute distress. Has anasarca. HEENT:       Eyes: PERRL, EOMI, no scleral icterus.       ENT: No discharge from the ears and nose, no pharynx injection, no tonsillar enlargement.        Neck: No JVD, no  bruit, no mass felt. Heme: No neck lymph node enlargement. Cardiac: S1/S2, RRR, No murmurs, No gallops or rubs. Respiratory:  Has fine crackles bilaterally, no wheezing, rhonchi or rubs. GI: distended and has tenderness mainly in the right-sided abdomen, no rebound pain, no organomegaly, BS present. GU: No hematuria Ext: 3+ pitting leg edema bilaterally. 2+DP/PT pulse bilaterally. Musculoskeletal: No joint deformities, No joint redness or warmth, no limitation of ROM in spin. Skin: No rashes.  Neuro: Alert, oriented X3, cranial nerves II-XII grossly intact, moves all extremities normally.  Psych: Patient is not psychotic, no suicidal or hemocidal ideation.  Labs on Admission: I have personally reviewed following labs and imaging studies  CBC: Recent Labs  Lab 01/07/18 1303 01/10/18 2256  WBC 5.8 4.9  NEUTROABS 3.8 3.7  HGB 9.2* 9.3*  HCT 27.6* 28.1*  MCV 100.0* 102.9*  PLT 110* 371*   Basic Metabolic Panel: Recent Labs  Lab 01/07/18 1303 01/10/18 2256  NA 137 137  K 3.5 4.4  CL 99 101  CO2 27 25  GLUCOSE 134* 209*  BUN 11 24*  CREATININE 0.91 0.85  CALCIUM 8.0* 8.3*   GFR: Estimated Creatinine Clearance: 117.7 mL/min (by C-G formula based on SCr of 0.85 mg/dL). Liver  Function Tests: Recent Labs  Lab 01/07/18 1303 01/10/18 2256  AST 34 40  ALT 28 29  ALKPHOS 108 82  BILITOT 1.2 1.3*  PROT 6.9 6.5  ALBUMIN 2.5* 2.5*   Recent Labs  Lab 01/10/18 2256  LIPASE 49   Recent Labs  Lab 01/10/18 2256  AMMONIA 54*   Coagulation Profile: Recent Labs  Lab 01/10/18 2140  INR 1.04   Cardiac Enzymes: No results for input(s): CKTOTAL, CKMB, CKMBINDEX, TROPONINI in the last 168 hours. BNP (last 3 results) No results for input(s): PROBNP in the last 8760 hours. HbA1C: No results for input(s): HGBA1C in the last 72 hours. CBG: No results for input(s): GLUCAP in the last 168 hours. Lipid Profile: No results for input(s): CHOL, HDL, LDLCALC, TRIG, CHOLHDL, LDLDIRECT in the last 72 hours. Thyroid Function Tests: No results for input(s): TSH, T4TOTAL, FREET4, T3FREE, THYROIDAB in the last 72 hours. Anemia Panel: No results for input(s): VITAMINB12, FOLATE, FERRITIN, TIBC, IRON, RETICCTPCT in the last 72 hours. Urine analysis:    Component Value Date/Time   COLORURINE YELLOW 01/10/2018 2140   APPEARANCEUR CLEAR 01/10/2018 2140   LABSPEC 1.011 01/10/2018 2140   PHURINE 5.0 01/10/2018 2140   GLUCOSEU 50 (A) 01/10/2018 2140   HGBUR NEGATIVE 01/10/2018 2140   BILIRUBINUR NEGATIVE 01/10/2018 2140   KETONESUR NEGATIVE 01/10/2018 2140   PROTEINUR NEGATIVE 01/10/2018 2140   NITRITE NEGATIVE 01/10/2018 2140   LEUKOCYTESUR NEGATIVE 01/10/2018 2140   Sepsis Labs: @LABRCNTIP (procalcitonin:4,lacticidven:4) )No results found for this or any previous visit (from the past 240 hour(s)).   Radiological Exams on Admission: Dg Chest 2 View  Result Date: 01/10/2018 CLINICAL DATA:  Fluid overload. EXAM: CHEST - 2 VIEW COMPARISON:  Radiograph of December 13, 2017. FINDINGS: The heart size and mediastinal contours are within normal limits. Both lungs are clear. No pneumothorax or pleural effusion is noted. The visualized skeletal structures are unremarkable.  IMPRESSION: No active cardiopulmonary disease. Electronically Signed   By: Marijo Conception, M.D.   On: 01/10/2018 21:46     EKG: Independently reviewed.  Sinus rhythm, QTC 417, low voltage, poor R wave progression, LAD, nonspecific T wave change.  Assessment/Plan Principal Problem:   Anasarca Active Problems:   Diabetes mellitus without complication ([place])  Essential hypertension   Extensive stage primary small cell carcinoma of lung (HCC)   Antineoplastic chemotherapy induced pancytopenia (HCC)   Liver metastasis (HCC)   Anemia   Severe protein-calorie malnutrition (HCC)   Chronic diastolic CHF (congestive heart failure) (HCC)   Abdominal pain   Elevated lactic acid level   Anasarca: likely multifactorial etiology, including hypoalbuminemia, liver dysfunction and dCHF.  Patient needs therapeutic paracentesis.  -will place on MedSurg bed for observation -lasix 40 bid IV -Give albumin 100 g x 1 now -please call IR for paracentesis in morning  Chronic diastolic CHF: 2D echo on 07/05/2246 showed EF of 50-50% with grade 1 diastolic dysfunction.  BNP 90.1.  Patient has anasarca and fluid overload, but no respiratory distress or shortness of breath. -On IV Lasix as above  Extensive stage primary small cell carcinoma of lung with Liver metastasis : Patient is on chemotherapy every 3 weeks, followed up by [name].  Last dose of chemotherapy was yesterday. -Follow-up with [name]  Diabetes mellitus without complication [facility]): Last A1c , not well controled. Patient is taking metformin and NovoLog at home -SSI -Hold metformin  HTN:  -Continue home medications: Coreg, -On IV Lasix as above -IV hydralazine prn  Antineoplastic chemotherapy induced pancytopenia ([place]): WBC 4.9, hemoglobin 9.3 which was 9.2 on 01/07/2018, platelets 122, no bleeding tendency. -Follow-up with CBC  Severe protein-calorie malnutrition ([place]): -Nutrition consult  Abdominal pain: Partially due to  metastasized liver disease, but his abdominal pain has worsened than baseline.  Cannot completely rule out SBP. -Start ciprofloxacin per pharm (patient is allergic to amoxicillin with anaphylaxis reaction) -Blood culture  Elevated lactic acid level: Likely due to combination of metformin use and decreased clearance 2/2 liver dysfunction.  Patient does not seem to have sepsis given no fever and leukocytosis -Will not start IV fluid due to fluid overload -Trend lactic acid level - Hold metformin   DVT ppx: SCD Code Status: Full code Family Communication:   Yes, patient's daughter at bed side Disposition Plan:  Anticipate discharge back to previous home environment Consults called:  none Admission status: medical floor/obs     Date of Service 01/11/2018    Ivor Costa [facility] Pager [phone]  If 7PM-7AM, please contact night-coverage [email or web address] Password [email or web address] 01/11/2018, 2:01 AM

## 2018-01-11 NOTE — ED Notes (Signed)
Pt allowed to eat and drink per MD. Given soup

## 2018-01-11 NOTE — Progress Notes (Signed)
The patient was admitted early this morning after midnight and H&P has been reviewed and I am in current agreement with assessment and plan done by Dr. Ivor Costa.  Additional changes the plan of care been made accordingly.  The patient is a 72 year old Caucasian male with past medical history significant for metastatic small cell lung carcinoma with mets to the liver and cervical spine history of chemotherapy-induced pancytopenia, diabetes mellitus, GERD, Chronic CHF and other comorbidities who circular and abdominal pain.  Of note patient was recently hospitalized from 8 30-9 10 because of anasarca and SBP and underwent therapeutic paracentesis at that time and was treated with antibiotics.  Over the last 4 days since receiving chemotherapy he is gained 14.8 pounds and has had worsening dyspnea abdominal distention and bilateral lower extremity edema associated with right-sided abdominal pain.  He was worked up and admitted to the hospital for anasarca and a paracentesis has been ordered.  Patient was placed on IV ceftriaxone for concern for SBP given his abdominal pain. He sees [name] in outpatient setting for his primary small cell lung carcinoma with liver metastasis.  We will continue current care plan and monitor patient's clinical response to intervention and repeat blood work in the morning and follow-up on paracentesis.

## 2018-01-12 DIAGNOSIS — T451X5A Adverse effect of antineoplastic and immunosuppressive drugs, initial encounter: Secondary | ICD-10-CM | POA: Diagnosis present

## 2018-01-12 DIAGNOSIS — I10 Essential (primary) hypertension: Secondary | ICD-10-CM | POA: Diagnosis not present

## 2018-01-12 DIAGNOSIS — D649 Anemia, unspecified: Secondary | ICD-10-CM | POA: Diagnosis not present

## 2018-01-12 DIAGNOSIS — E8809 Other disorders of plasma-protein metabolism, not elsewhere classified: Secondary | ICD-10-CM | POA: Diagnosis present

## 2018-01-12 DIAGNOSIS — Z96651 Presence of right artificial knee joint: Secondary | ICD-10-CM | POA: Diagnosis present

## 2018-01-12 DIAGNOSIS — Z800 Family history of malignant neoplasm of digestive organs: Secondary | ICD-10-CM | POA: Diagnosis not present

## 2018-01-12 DIAGNOSIS — R740 Nonspecific elevation of levels of transaminase and lactic acid dehydrogenase [LDH]: Secondary | ICD-10-CM | POA: Diagnosis present

## 2018-01-12 DIAGNOSIS — R140 Abdominal distension (gaseous): Secondary | ICD-10-CM | POA: Diagnosis present

## 2018-01-12 DIAGNOSIS — R109 Unspecified abdominal pain: Secondary | ICD-10-CM | POA: Diagnosis not present

## 2018-01-12 DIAGNOSIS — C7951 Secondary malignant neoplasm of bone: Secondary | ICD-10-CM | POA: Diagnosis present

## 2018-01-12 DIAGNOSIS — E43 Unspecified severe protein-calorie malnutrition: Secondary | ICD-10-CM | POA: Diagnosis present

## 2018-01-12 DIAGNOSIS — Z8719 Personal history of other diseases of the digestive system: Secondary | ICD-10-CM | POA: Diagnosis not present

## 2018-01-12 DIAGNOSIS — Z8619 Personal history of other infectious and parasitic diseases: Secondary | ICD-10-CM | POA: Diagnosis not present

## 2018-01-12 DIAGNOSIS — C787 Secondary malignant neoplasm of liver and intrahepatic bile duct: Secondary | ICD-10-CM | POA: Diagnosis present

## 2018-01-12 DIAGNOSIS — R180 Malignant ascites: Secondary | ICD-10-CM | POA: Diagnosis present

## 2018-01-12 DIAGNOSIS — E119 Type 2 diabetes mellitus without complications: Secondary | ICD-10-CM | POA: Diagnosis not present

## 2018-01-12 DIAGNOSIS — E1165 Type 2 diabetes mellitus with hyperglycemia: Secondary | ICD-10-CM | POA: Diagnosis present

## 2018-01-12 DIAGNOSIS — D61810 Antineoplastic chemotherapy induced pancytopenia: Secondary | ICD-10-CM | POA: Diagnosis present

## 2018-01-12 DIAGNOSIS — I5032 Chronic diastolic (congestive) heart failure: Secondary | ICD-10-CM | POA: Diagnosis present

## 2018-01-12 DIAGNOSIS — E871 Hypo-osmolality and hyponatremia: Secondary | ICD-10-CM | POA: Diagnosis not present

## 2018-01-12 DIAGNOSIS — K219 Gastro-esophageal reflux disease without esophagitis: Secondary | ICD-10-CM | POA: Diagnosis present

## 2018-01-12 DIAGNOSIS — D696 Thrombocytopenia, unspecified: Secondary | ICD-10-CM | POA: Diagnosis present

## 2018-01-12 DIAGNOSIS — Z6834 Body mass index (BMI) 34.0-34.9, adult: Secondary | ICD-10-CM | POA: Diagnosis not present

## 2018-01-12 DIAGNOSIS — C3490 Malignant neoplasm of unspecified part of unspecified bronchus or lung: Secondary | ICD-10-CM | POA: Diagnosis present

## 2018-01-12 DIAGNOSIS — E669 Obesity, unspecified: Secondary | ICD-10-CM | POA: Diagnosis present

## 2018-01-12 DIAGNOSIS — R601 Generalized edema: Secondary | ICD-10-CM | POA: Diagnosis not present

## 2018-01-12 DIAGNOSIS — I110 Hypertensive heart disease with heart failure: Secondary | ICD-10-CM | POA: Diagnosis present

## 2018-01-12 DIAGNOSIS — F17211 Nicotine dependence, cigarettes, in remission: Secondary | ICD-10-CM | POA: Diagnosis present

## 2018-01-12 DIAGNOSIS — E876 Hypokalemia: Secondary | ICD-10-CM | POA: Diagnosis not present

## 2018-01-12 LAB — COMPREHENSIVE METABOLIC PANEL
ALT: 32 U/L (ref 0–44)
AST: 45 U/L — AB (ref 15–41)
Albumin: 2.9 g/dL — ABNORMAL LOW (ref 3.5–5.0)
Alkaline Phosphatase: 89 U/L (ref 38–126)
Anion gap: 12 (ref 5–15)
BILIRUBIN TOTAL: 1.8 mg/dL — AB (ref 0.3–1.2)
BUN: 24 mg/dL — AB (ref 8–23)
CO2: 30 mmol/L (ref 22–32)
CREATININE: 0.84 mg/dL (ref 0.61–1.24)
Calcium: 8.6 mg/dL — ABNORMAL LOW (ref 8.9–10.3)
Chloride: 98 mmol/L (ref 98–111)
Glucose, Bld: 288 mg/dL — ABNORMAL HIGH (ref 70–99)
POTASSIUM: 3.8 mmol/L (ref 3.5–5.1)
Sodium: 140 mmol/L (ref 135–145)
TOTAL PROTEIN: 6.2 g/dL — AB (ref 6.5–8.1)

## 2018-01-12 LAB — GLUCOSE, CAPILLARY
Glucose-Capillary: 231 mg/dL — ABNORMAL HIGH (ref 70–99)
Glucose-Capillary: 197 mg/dL — ABNORMAL HIGH (ref 70–99)
Glucose-Capillary: 226 mg/dL — ABNORMAL HIGH (ref 70–99)

## 2018-01-12 LAB — CBC WITH DIFFERENTIAL/PLATELET
BASOS ABS: 0 10*3/uL (ref 0.0–0.1)
Basophils Relative: 0 %
EOS PCT: 1 %
Eosinophils Absolute: 0 10*3/uL (ref 0.0–0.7)
HCT: 24.1 % — ABNORMAL LOW (ref 39.0–52.0)
Hemoglobin: 8.1 g/dL — ABNORMAL LOW (ref 13.0–17.0)
LYMPHS PCT: 18 %
Lymphs Abs: 0.5 10*3/uL — ABNORMAL LOW (ref 0.7–4.0)
MCH: 34.2 pg — AB (ref 26.0–34.0)
MCHC: 33.6 g/dL (ref 30.0–36.0)
MCV: 101.7 fL — ABNORMAL HIGH (ref 78.0–100.0)
MONO ABS: 0 10*3/uL — AB (ref 0.1–1.0)
Monocytes Relative: 1 %
Neutro Abs: 2.2 10*3/uL (ref 1.7–7.7)
Neutrophils Relative %: 80 %
PLATELETS: 84 10*3/uL — AB (ref 150–400)
RBC: 2.37 MIL/uL — ABNORMAL LOW (ref 4.22–5.81)
RDW: 17.6 % — AB (ref 11.5–15.5)
WBC: 2.7 10*3/uL — ABNORMAL LOW (ref 4.0–10.5)

## 2018-01-12 LAB — URINE CULTURE: Culture: NO GROWTH

## 2018-01-12 LAB — GRAM STAIN

## 2018-01-12 LAB — MAGNESIUM: MAGNESIUM: 1.3 mg/dL — AB (ref 1.7–2.4)

## 2018-01-12 LAB — PHOSPHORUS: PHOSPHORUS: 4 mg/dL (ref 2.5–4.6)

## 2018-01-12 MED ORDER — FUROSEMIDE 10 MG/ML IJ SOLN
60.0000 mg | Freq: Two times a day (BID) | INTRAMUSCULAR | Status: DC
  Administered 2018-01-12 – 2018-01-14 (×4): 60 mg via INTRAVENOUS
  Filled 2018-01-12 (×4): qty 6

## 2018-01-12 MED ORDER — MAGNESIUM SULFATE 50 % IJ SOLN
3.0000 g | Freq: Once | INTRAVENOUS | Status: AC
  Administered 2018-01-12: 3 g via INTRAVENOUS
  Filled 2018-01-12: qty 6

## 2018-01-12 MED ORDER — PRO-STAT SUGAR FREE PO LIQD
30.0000 mL | Freq: Two times a day (BID) | ORAL | Status: DC
  Administered 2018-01-12 – 2018-01-16 (×5): 30 mL via ORAL
  Filled 2018-01-12 (×9): qty 30

## 2018-01-12 NOTE — Progress Notes (Signed)
Initial Nutrition Assessment  DOCUMENTATION CODES:   Obesity unspecified  INTERVENTION:   Provide Prostat liquid protein PO 30 ml BID with meals, each supplement provides 100 kcal, 15 grams protein.  NUTRITION DIAGNOSIS:   Increased nutrient needs related to cancer and cancer related treatments as evidenced by estimated needs.  GOAL:   Patient will meet greater than or equal to 90% of their needs  MONITOR:   PO intake, Supplement acceptance, Labs, Weight trends, I & O's  REASON FOR ASSESSMENT:   Consult Assessment of nutrition requirement/status  ASSESSMENT:   72 y.o. 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 Admitted for anasarca and abdominal pain.  Patient currently eating well, 75-100% of meals. Pt with weight gain from fluid accumulation. S/p paracentesis 9/28 which yielded 5.3L. Pt does not like protein supplements but will order Prostat to provide additional protein in combination with meals.   Patient was diagnosed with moderate malnutrition during previous admission 9/5. Pt may still meet criteria but unable to determine given fluid and now PO has improved.  Medications: IV Lasix BID, Lovaza capsule BID, Vitamin B-12 tablet daily Labs reviewed: CBGs: 203-231 Low Mg Phos WNL   NUTRITION - FOCUSED PHYSICAL EXAM:    Most Recent Value  Orbital Region  No depletion  Upper Arm Region  Mild depletion  Thoracic and Lumbar Region  Unable to assess  Buccal Region  No depletion  Temple Region  Mild depletion  Clavicle Bone Region  Mild depletion  Clavicle and Acromion Bone Region  No depletion  Scapular Bone Region  Unable to assess  Dorsal Hand  No depletion  Patellar Region  No depletion  Anterior Thigh Region  No depletion  Posterior Calf Region  No depletion  Edema (RD Assessment)  Moderate       Diet Order:   Diet Order        Diet heart healthy/carb modified Room service appropriate? Yes; Fluid consistency: Thin  Diet effective now              EDUCATION NEEDS:   No education needs have been identified at this time  Skin:     Last BM:  9/27  Height:   Ht Readings from Last 1 Encounters:  01/11/18 6\' 3"  (1.905 m)    Weight:   Wt Readings from Last 1 Encounters:  01/11/18 134.7 kg    Ideal Body Weight:  89.1 kg  BMI:  Body mass index is 37.11 kg/m.  Estimated Nutritional Needs:   Kcal:  2500-2700  Protein:  130-140g  Fluid:  2L/day   Clayton Bibles, MS, RD, LDN [place] [facility] Pager: [phone] After Hours Pager: [phone]

## 2018-01-12 NOTE — Progress Notes (Signed)
PROGRESS NOTE    Robert Compton  SWF:[id number] DOB: 1945-09-11 DOA: 01/10/2018 PCP: [name], MD  Brief Narrative:  The patient is a 72 year old Caucasian male with past medical history significant for metastatic small cell lung carcinoma with mets to the liver and cervical spine history of chemotherapy-induced pancytopenia, diabetes mellitus, GERD, Chronic CHF and other comorbidities who presented with a complaint of abdominal pain.  Of note patient was recently hospitalized from 8/30-9/10 because of anasarca and SBP and underwent therapeutic paracentesis at that time and was treated with antibiotics.  Over the last 4 days since receiving chemotherapy he is gained 14.8 pounds and has had worsening dyspnea abdominal distention and bilateral lower extremity edema associated with right-sided abdominal pain.  He was worked up and admitted to the hospital for anasarca and a paracentesis was done and patient had 5.2 Liters removed.    Patient was placed on IV ceftriaxone for concern for SBP given his abdominal pain. He sees [name] in outpatient setting for his primary small cell lung carcinoma with liver metastasis. He is diuresing but will up his Lasix as he is still extremely volume overloaded.  **Underwent therapeutic paracentesis and drain 5.2 L off.  We will continue antibiotics for now as well as continue with IV diuresis.  Will discuss with [name] about giving Granix if necessary.  Assessment & Plan:   Principal Problem:   Anasarca Active Problems:   Diabetes mellitus without complication ([place])   Essential hypertension   Extensive stage primary small cell carcinoma of lung (HCC)   Antineoplastic chemotherapy induced pancytopenia (HCC)   Liver metastasis (HCC)   Anemia   Severe protein-calorie malnutrition (HCC)   Chronic diastolic CHF (congestive heart failure) (HCC)   Abdominal pain   Elevated lactic acid level  Anasarca -likely multifactorial etiology, including  hypoalbuminemia, liver dysfunction and dCHF.  Patient needed therapeutic Paracentesis and drained 5.2 Liters. -Lasix 40 bid IV increased to 60 mg BID  -Give albumin 100 g x 1 now -Continue to Monitor Volume Status Carefully   Chronic diastolic CHF:  -2D echo on 09/28/2017 showed EF of 50-50% with grade 1 diastolic dysfunction.   -BNP 90.1.  Patient has anasarca and fluid overload, but no respiratory distress or shortness of breath. -On IV Lasix as above  Extensive stage primary small cell carcinoma of lung with Liver metastasis  -Patient is on chemotherapy every 3 weeks, followed up by [name].  Last dose of chemotherapy was the day before Admission. -Follow-up with [name]  Diabetes mellitus without complication ([name]) -Last A1c was 7.2, Not well controled. Patient is taking metformin and NovoLog at home -C/w Sensitive Novolog SSI AC/HS -Continue to Hold Metformin  HTN:  -Continue home medications: Coreg, -On IV Lasix as above and will increase to 60 mg IV BID  -IV Hydralazine prn  Antineoplastic chemotherapy induced pancytopenia (HCC) -WBC 4.9, hemoglobin 9.3 which was 9.2 on 01/07/2018, platelets 122, no bleeding tendency. -Follow-up with CBC revealed WBC of 2.7, Hb/Hct of 8.1/24.1 and Platelet Count of 84 -ANC was 2.2 and no indication for Granix currently -Repeat CBC with Differential in AM   Severe protein-calorie malnutrition ([name]): -Nutrition consult -Nutritionist recommending prostate liquid p.o. 30 mils twice daily with meals  Abdominal pain, slightly improved -Partially due to metastasized liver disease, but his abdominal pain has worsened than baseline.  Cannot completely rule out SBP. -Paracentesis done and SBP less likely -Started Ciprofloxacin per pharm (patient is allergic to amoxicillin with anaphylaxis reaction) and will continue  for now -Blood Cultures x2 showed NGTD at 1 Day  -Pain Control with Oxycodone IR5 mg po q6hprn  Elevated Lactic Acid  Level -Likely due to combination of metformin use and decreased clearance 2/2 liver dysfunction.  Patient does not seem be sepetic given no fever and leukocytosis -Will not start IV fluid due to fluid overload -Trend lactic acid level -Hold metformin -LA was 2.72; Repeat Level Pending  Hypomagnesemia -Patient magnesium level this morning was 1.3 -Replete with mag sulfate 3 g -Continue monitor and replete as necessary -Repeat Mag Level in AM .  DVT prophylaxis: SCDs due to thrombocytopenia Code Status: FULL CODE Family Communication: No family present at bedside Disposition Plan: Pending Clinical Improvement and   Consultants:   IR  Will notify [name] via EPIC   Procedures: Paracentesis    Antimicrobials:  Anti-infectives (From admission, onward)   Start     Dose/Rate Route Frequency Ordered Stop   01/11/18 0130  ciprofloxacin (CIPRO) IVPB 400 mg     400 mg 200 mL/hr over 60 Minutes Intravenous 2 times daily 01/11/18 0119       Subjective: Seen and examined at bedside and states that his abdomen is feeling slightly better but still however some pain.  Still felt very swollen and thought he needed another paracentesis.  No chest pain, lightheadedness or dizziness.  No other concerns or complaints at this time but did wonder about receiving Granix.    Objective: Vitals:   01/11/18 1330 01/11/18 2120 01/12/18 0435 01/12/18 1352  BP: 124/73 112/61 122/70 120/67  Pulse: (!) 106 96 82 88  Resp: 20 18 16 16   Temp: 98.6 F (37 C) 97.9 F (36.6 C) 97.9 F (36.6 C) 97.8 F (36.6 C)  TempSrc:  Oral Oral Oral  SpO2: 95% 97% 98% 98%  Weight:      Height:        Intake/Output Summary (Last 24 hours) at 01/12/2018 1904 Last data filed at 01/12/2018 1753 Gross per 24 hour  Intake 1361.96 ml  Output 6125 ml  Net -4763.04 ml   Filed Weights   01/11/18 0236  Weight: 134.7 kg   Examination: Physical Exam:  Constitutional: WN/WD obese Caucasian male NAD and appears  calm and comfortable Eyes:  Lids and conjunctivae normal, sclerae anicteric  ENMT: External Ears, Nose appear normal. Grossly normal hearing. Mucous membranes are moist.  Neck: Appears normal, supple, no cervical masses, normal ROM, no appreciable thyromegaly; no JVD Respiratory: Diminished to auscultation bilaterally, no wheezing, rales, rhonchi or crackles. Normal respiratory effort and patient is not tachypenic. No accessory muscle use.  Cardiovascular: RRR, no murmurs / rubs / gallops. S1 and S2 auscultated. 1-2+ LE Edema Abdomen: Soft, Tender to palpate, Distended 2/2 body habitus and has some ascites.  Bowel sounds positive.  GU: Deferred. Musculoskeletal: No clubbing / cyanosis of digits/nails. Normal strength and muscle tone.  Skin: No rashes, lesions, ulcers on a limited skin eval. No induration; Warm and dry.  Neurologic: CN 2-12 grossly intact with no focal deficits. Romberg sign and cerebellar reflexes not assessed.  Psychiatric: Normal judgment and insight. Alert and oriented x 3. Normal mood and flat affect.   Data Reviewed: I have personally reviewed following labs and imaging studies  CBC: Recent Labs  Lab 01/07/18 1303 01/10/18 2256 01/11/18 0447 01/12/18 1001  WBC 5.8 4.9 4.1 2.7*  NEUTROABS 3.8 3.7  --  2.2  HGB 9.2* 9.3* 8.1* 8.1*  HCT 27.6* 28.1* 24.9* 24.1*  MCV 100.0* 102.9* 102.9*  101.7*  PLT 110* 122* 111* 84*   Basic Metabolic Panel: Recent Labs  Lab 01/07/18 1303 01/10/18 2256 01/11/18 0447 01/12/18 1001  NA 137 137 139 140  K 3.5 4.4 4.2 3.8  CL 99 101 103 98  CO2 27 25 27 30   GLUCOSE 134* 209* 155* 288*  BUN 11 24* 25* 24*  CREATININE 0.91 0.85 0.78 0.84  CALCIUM 8.0* 8.3* 8.2* 8.6*  MG  --   --   --  1.3*  PHOS  --   --   --  4.0   GFR: Estimated Creatinine Clearance: 121.1 mL/min (by C-G formula based on SCr of 0.84 mg/dL). Liver Function Tests: Recent Labs  Lab 01/07/18 1303 01/10/18 2256 01/11/18 0447 01/12/18 1001  AST 34 40 31  45*  ALT 28 29 26  32  ALKPHOS 108 82 73 89  BILITOT 1.2 1.3* 1.5* 1.8*  PROT 6.9 6.5 6.2* 6.2*  ALBUMIN 2.5* 2.5* 2.7* 2.9*   Recent Labs  Lab 01/10/18 2256  LIPASE 49   Recent Labs  Lab 01/10/18 2256  AMMONIA 54*   Coagulation Profile: Recent Labs  Lab 01/10/18 2140  INR 1.04   Cardiac Enzymes: No results for input(s): CKTOTAL, CKMB, CKMBINDEX, TROPONINI in the last 168 hours. BNP (last 3 results) No results for input(s): PROBNP in the last 8760 hours. HbA1C: No results for input(s): HGBA1C in the last 72 hours. CBG: Recent Labs  Lab 01/11/18 1217 01/11/18 1628 01/11/18 2113 01/12/18 0737 01/12/18 1736  GLUCAP 190* 222* 203* 231* 197*   Lipid Profile: No results for input(s): CHOL, HDL, LDLCALC, TRIG, CHOLHDL, LDLDIRECT in the last 72 hours. Thyroid Function Tests: No results for input(s): TSH, T4TOTAL, FREET4, T3FREE, THYROIDAB in the last 72 hours. Anemia Panel: No results for input(s): VITAMINB12, FOLATE, FERRITIN, TIBC, IRON, RETICCTPCT in the last 72 hours. Sepsis Labs: Recent Labs  Lab 01/10/18 2220 01/11/18 0447  PROCALCITON  --  0.28  LATICACIDVEN 2.72*  --     Recent Results (from the past 240 hour(s))  Urine culture     Status: None   Collection Time: 01/10/18  9:40 PM  Result Value Ref Range Status   Specimen Description   Final    URINE, RANDOM Performed at [facility] [address], [place], [place] [address]    Special Requests   Final    NONE Performed at [facility], [place] [address], [place], [place] [address]    Culture   Final    NO GROWTH Performed at [facility], [place] [address] [place], [place] [address]    Report Status 01/12/2018 FINAL  Final  Culture, blood (x 2)     Status: None (Preliminary result)   Collection Time: 01/11/18  4:47 AM  Result Value Ref Range Status   Specimen Description   Final    BLOOD RIGHT HAND Performed at [facility]  [address], [place], [place] [address]    Special Requests   Final    BOTTLES DRAWN AEROBIC ONLY Blood Culture adequate volume Performed at [facility] [address] [place], [place] [address]    Culture   Final    NO GROWTH 1 DAY Performed at [facility], [place] [address], [place], [place] [address]    Report Status PENDING  Incomplete  Culture, blood (x 2)     Status: None (Preliminary result)   Collection Time: 01/11/18  4:47 AM  Result  Value Ref Range Status   Specimen Description   Final    BLOOD RIGHT HAND Performed at [facility] [address] [place], [place] [address]    Special Requests   Final    BOTTLES DRAWN AEROBIC ONLY Blood Culture adequate volume Performed at [facility] [address] [place], [place] [address]    Culture   Final    NO GROWTH 1 DAY Performed at [facility], [place] [address] [place] [address] [place], [place] [address]    Report Status PENDING  Incomplete  Gram stain     Status: None   Collection Time: 01/11/18 11:27 AM  Result Value Ref Range Status   Specimen Description FLUID LEFT ABDOMEN  Final   Special Requests   Final    NONE Performed at [facility] [address] [place], [place] [address]    Gram Stain   Final    RARE WBC PRESENT, PREDOMINANTLY MONONUCLEAR NO ORGANISMS SEEN Performed at [facility], [place] [address] [place], [place] [address]    Report Status 01/12/2018 FINAL  Final  Culture, body fluid-bottle     Status: None (Preliminary result)   Collection Time: 01/11/18 11:27 AM  Result Value Ref Range Status   Specimen Description FLUID LEFT ABDOMEN  Final   Special Requests BOTTLES DRAWN AEROBIC AND ANAEROBIC  Final   Culture   Final    NO GROWTH < 24 HOURS Performed at [facility], [place] [address], [place], [place] [address]    Report Status PENDING  Incomplete    Radiology Studies: Dg Chest 2 View  Result Date:  01/10/2018 CLINICAL DATA:  Fluid overload. EXAM: CHEST - 2 VIEW COMPARISON:  Radiograph of December 13, 2017. FINDINGS: The heart size and mediastinal contours are within normal limits. Both lungs are clear. No pneumothorax or pleural effusion is noted. The visualized skeletal structures are unremarkable. IMPRESSION: No active cardiopulmonary disease. Electronically Signed   By: Marijo Conception, M.D.   On: 01/10/2018 21:46   US Paracentesis  Result Date: 01/11/2018 INDICATION: Patient with malignant ascites. Request is made for diagnostic and therapeutic paracentesis. EXAM: ULTRASOUND GUIDED DIAGNOSTIC AND THERAPEUTIC PARACENTESIS MEDICATIONS: 10 mL 1% lidocaine COMPLICATIONS: None immediate. PROCEDURE: Informed written consent was obtained from the patient after a discussion of the risks, benefits and alternatives to treatment. A timeout was performed prior to the initiation of the procedure. Initial ultrasound scanning demonstrates a moderate amount of ascites within the left lateral. The left lateral abdomen was prepped and draped in the usual sterile fashion. 1% lidocaine was used for local anesthesia. Following this, a 19 gauge, 7-cm, Yueh catheter was introduced. An ultrasound image was saved for documentation purposes. The paracentesis was performed. The catheter was removed and a dressing was applied. The patient tolerated the procedure well without immediate post procedural complication. FINDINGS: A total of approximately 5.2 liters of yellow fluid was removed. Samples were sent to the laboratory as requested by the clinical team. IMPRESSION: Successful ultrasound-guided diagnostic and therapeutic paracentesis yielding 5.2 liters of peritoneal fluid. Read by: Brynda Greathouse PA-C Electronically Signed   By: Marybelle Killings M.D.   On: 01/11/2018 12:31   Scheduled Meds: . carvedilol  3.125 mg Oral BID WC  . feeding supplement (PRO-STAT SUGAR FREE 64)  30 mL Oral BID  . furosemide  40 mg Intravenous BID  .  insulin aspart  0-5 Units Subcutaneous QHS  . insulin aspart  0-9 Units  Subcutaneous TID WC  . loratadine  10 mg Oral Daily  . nicotine  14 mg Transdermal Daily  . omega-3 acid ethyl esters  1 g Oral BID  . pantoprazole  40 mg Oral Daily  . vitamin B-12  100 mcg Oral Daily   Continuous Infusions: . sodium chloride 10 mL/hr at 01/12/18 0334  . ciprofloxacin 400 mg (01/12/18 1012)    LOS: 0 days   Kerney Elbe, DO [facility] PAGER is on [place]  If 7PM-7AM, please contact night-coverage [email or web address] Password [facility] 01/12/2018, 7:04 PM

## 2018-01-13 ENCOUNTER — Inpatient Hospital Stay: Payer: BLUE CROSS/BLUE SHIELD

## 2018-01-13 LAB — CBC WITH DIFFERENTIAL/PLATELET
Basophils Absolute: 0 10*3/uL (ref 0.0–0.1)
Basophils Relative: 0 %
EOS ABS: 0 10*3/uL (ref 0.0–0.7)
EOS PCT: 1 %
HCT: 24.8 % — ABNORMAL LOW (ref 39.0–52.0)
HEMOGLOBIN: 8.5 g/dL — AB (ref 13.0–17.0)
LYMPHS ABS: 0.7 10*3/uL (ref 0.7–4.0)
Lymphocytes Relative: 21 %
MCH: 34.6 pg — AB (ref 26.0–34.0)
MCHC: 34.3 g/dL (ref 30.0–36.0)
MCV: 100.8 fL — ABNORMAL HIGH (ref 78.0–100.0)
MONOS PCT: 1 %
Monocytes Absolute: 0 10*3/uL — ABNORMAL LOW (ref 0.1–1.0)
Neutro Abs: 2.7 10*3/uL (ref 1.7–7.7)
Neutrophils Relative %: 77 %
PLATELETS: 75 10*3/uL — AB (ref 150–400)
RBC: 2.46 MIL/uL — ABNORMAL LOW (ref 4.22–5.81)
RDW: 17.2 % — ABNORMAL HIGH (ref 11.5–15.5)
WBC: 3.4 10*3/uL — ABNORMAL LOW (ref 4.0–10.5)

## 2018-01-13 LAB — GLUCOSE, CAPILLARY
Glucose-Capillary: 254 mg/dL — ABNORMAL HIGH (ref 70–99)
Glucose-Capillary: 166 mg/dL — ABNORMAL HIGH (ref 70–99)
Glucose-Capillary: 242 mg/dL — ABNORMAL HIGH (ref 70–99)
Glucose-Capillary: 182 mg/dL — ABNORMAL HIGH (ref 70–99)
Glucose-Capillary: 307 mg/dL — ABNORMAL HIGH (ref 70–99)

## 2018-01-13 LAB — PH, BODY FLUID: pH, Body Fluid: 7.5

## 2018-01-13 LAB — COMPREHENSIVE METABOLIC PANEL
ALK PHOS: 88 U/L (ref 38–126)
ALT: 37 U/L (ref 0–44)
AST: 45 U/L — ABNORMAL HIGH (ref 15–41)
Albumin: 2.8 g/dL — ABNORMAL LOW (ref 3.5–5.0)
Anion gap: 9 (ref 5–15)
BUN: 23 mg/dL (ref 8–23)
CALCIUM: 8.4 mg/dL — AB (ref 8.9–10.3)
CO2: 32 mmol/L (ref 22–32)
Chloride: 93 mmol/L — ABNORMAL LOW (ref 98–111)
Creatinine, Ser: 0.9 mg/dL (ref 0.61–1.24)
GFR calc non Af Amer: >60 mL/min (ref >60)
Glucose, Bld: 187 mg/dL — ABNORMAL HIGH (ref 70–99)
Potassium: 3.6 mmol/L (ref 3.5–5.1)
SODIUM: 134 mmol/L — AB (ref 135–145)
Total Bilirubin: 1.5 mg/dL — ABNORMAL HIGH (ref 0.3–1.2)
Total Protein: 6.1 g/dL — ABNORMAL LOW (ref 6.5–8.1)

## 2018-01-13 LAB — LACTIC ACID, PLASMA: LACTIC ACID, VENOUS: 1 mmol/L (ref 0.5–1.9)

## 2018-01-13 LAB — PHOSPHORUS: PHOSPHORUS: 3.7 mg/dL (ref 2.5–4.6)

## 2018-01-13 LAB — MAGNESIUM: Magnesium: 1.6 mg/dL — ABNORMAL LOW (ref 1.7–2.4)

## 2018-01-13 MED ORDER — MAGNESIUM SULFATE 2 GM/50ML IV SOLN
2.0000 g | Freq: Once | INTRAVENOUS | Status: AC
  Administered 2018-01-13: 2 g via INTRAVENOUS
  Filled 2018-01-13: qty 50

## 2018-01-13 MED ORDER — INSULIN ASPART 100 UNIT/ML ~~LOC~~ SOLN
3.0000 [IU] | Freq: Three times a day (TID) | SUBCUTANEOUS | Status: DC
  Administered 2018-01-14 – 2018-01-18 (×13): 3 [IU] via SUBCUTANEOUS

## 2018-01-13 MED ORDER — SIMETHICONE 80 MG PO CHEW
80.0000 mg | CHEWABLE_TABLET | Freq: Four times a day (QID) | ORAL | Status: DC | PRN
  Administered 2018-01-13 – 2018-01-16 (×4): 80 mg via ORAL
  Filled 2018-01-13 (×4): qty 1

## 2018-01-13 NOTE — Progress Notes (Signed)
PHARMACY NOTE -  ciprofloxacin  Pharmacy has been assisting with dosing of ciprofloxacin for suspected intra-abd infection. Dosage remains stable at 400 mg Iv q12h and need for further dosage adjustment appears unlikely at present.    Will sign off at this time.  Please reconsult if a change in clinical status warrants re-evaluation of dosage.  Dia Sitter, PharmD, BCPS 01/13/2018 12:38 PM

## 2018-01-13 NOTE — Progress Notes (Signed)
PROGRESS NOTE    Robert Compton  TKZ:[id number] DOB: February 10, 1946 DOA: 01/10/2018 PCP: [name], MD  Brief Narrative:  The patient is a 72 year old Caucasian male with past medical history significant for metastatic small cell lung carcinoma with mets to the liver and cervical spine history of chemotherapy-induced pancytopenia, diabetes mellitus, GERD, Chronic CHF and other comorbidities who presented with a complaint of abdominal pain.  Of note patient was recently hospitalized from 8/30-9/10 because of anasarca and SBP and underwent therapeutic paracentesis at that time and was treated with antibiotics.  Over the last 4 days since receiving chemotherapy he is gained 14.8 pounds and has had worsening dyspnea abdominal distention and bilateral lower extremity edema associated with right-sided abdominal pain.  He was worked up and admitted to the hospital for anasarca and a paracentesis was done and patient had 5.2 Liters removed.    Patient was placed on IV ceftriaxone for concern for SBP given his abdominal pain. He sees [name] in outpatient setting for his primary small cell lung carcinoma with liver metastasis. He is diuresing but will up his Lasix as he is still extremely volume overloaded.  **Underwent therapeutic paracentesis and drain 5.2 L off.  We will continue antibiotics for now as well as continue with IV diuresis.  Discussed with oncology about giving Granix for the last and they did not advise at this time and Dr. Burr Medico agrees with my recommendation to hold these while he is in the hospital.  Assessment & Plan:   Principal Problem:   Anasarca Active Problems:   Diabetes mellitus without complication ([place])   Essential hypertension   Extensive stage primary small cell carcinoma of lung (HCC)   Antineoplastic chemotherapy induced pancytopenia (HCC)   Liver metastasis (HCC)   Anemia   Severe protein-calorie malnutrition (HCC)   Chronic diastolic CHF (congestive heart  failure) (HCC)   Abdominal pain   Elevated lactic acid level  Anasarca, improving  -likely multifactorial etiology, including hypoalbuminemia, liver dysfunction and dCHF.  Patient needed therapeutic Paracentesis and drained 5.2 Liters. -Lasix 40 bid IV increased to 60 mg BID yesterday -Give albumin 100 g x 1 now -Continue to Monitor Volume Status Carefully  -Patient is -7.759 L and care weights are not been done  Chronic diastolic CHF:  -2D echo on 09/28/2017 showed EF of 50-50% with grade 1 diastolic dysfunction.   -BNP 90.1.  Patient has anasarca and fluid overload, but no respiratory distress or shortness of breath. -On IV Lasix as above  Extensive stage primary small cell carcinoma of lung with Liver metastasis  -Patient is on chemotherapy every 3 weeks, followed up by [name].  Last dose of chemotherapy was the day before Admission. -Follow-up with [name] and I spoke with Dr. Morey Hummingbird who notified [name] of patient's admission and [name] to see the patient in the hospital likely tomorrow  Diabetes mellitus without complication ([place]) -Last A1c was 7.2, Not well controled. Patient is taking metformin and NovoLog at home -C/w Sensitive Novolog SSI AC/HS and will add Novolog 3 units 3 times daily with meals -Continue to Hold Metformin -CBGs ranging from 166-254 -Diabetes education coordinator insulted for further evaluation recommendations  HTN:  -Continue home medications: Coreg, -On IV Lasix as above and will increase to 60 mg IV BID  -IV Hydralazine prn  Antineoplastic chemotherapy induced pancytopenia (HCC) -Admission CBC: WBC 4.9, hemoglobin 9.3 which was 9.2 on 01/07/2018, platelets 122, no bleeding tendency. -Follow-up with CBC revealed WBC of 3.4, Hb/Hct of  8.5/24.8 and Platelet Count of 78 -ANC was 2.7 and no indication for Granix currently and I discussed the case with Dr. Morey Hummingbird who does not recommend administering Granix or Neulasta in the hospital  currently -Repeat CBC with Differential in AM   Severe protein-calorie malnutrition [facility]): -Nutrition consult -Nutritionist recommending prostate liquid p.o. 30 mils twice daily with meals  Abdominal pain, slightly improved -Partially due to metastasized liver disease, but his abdominal pain has worsened than baseline.  Cannot completely rule out SBP. -Paracentesis done and SBP less likely -Started Ciprofloxacin per pharm (patient is allergic to amoxicillin with anaphylaxis reaction) and will continue for now -Blood Cultures x2 showed NGTD at 2 Day  -Pain Control with Oxycodone IR5 mg po q6hprn  Elevated Lactic Acid Level -Likely due to combination of metformin use and decreased clearance 2/2 liver dysfunction.  Patient does not seem be sepetic given no fever and leukocytosis -Will not start IV fluid due to fluid overload -Trend lactic acid level -Hold metformin -LA was 2.72; Repeat Level 1.0  Hypomagnesemia -Patient magnesium level this morning was 1.6 -Replete with mag sulfate 2 g -Continue monitor and replete as necessary -Repeat Mag Level in AM  Hyponatremia -Patient's sodium dropped from 140 -> 134 and Chloride Dropped from 98 -> 93 -In the setting of diuresis -Continue monitor and trend and repeat CMP in a.m.  DVT prophylaxis: SCDs due to thrombocytopenia Code Status: FULL CODE Family Communication: No family present at bedside Disposition Plan: Pending Clinical Improvement and   Consultants:   IR  Will notify [name] via Epic and Dr. Burr Medico to notify [name] as well   Procedures: Paracentesis    Antimicrobials:  Anti-infectives (From admission, onward)   Start     Dose/Rate Route Frequency Ordered Stop   01/11/18 0130  ciprofloxacin (CIPRO) IVPB 400 mg     400 mg 200 mL/hr over 60 Minutes Intravenous 2 times daily 01/11/18 0119       Subjective: Seen and examined at bedside and states his abdomen was hurting last night but not as much today but  still hurting.  No chest pain, lightheadedness or dizziness.  No other concerns or complaints at this time and still feels swollen but not as much.  Leg extremities are improving he thinks.  Objective: Vitals:   01/12/18 2115 01/13/18 0412 01/13/18 1442 01/13/18 1443  BP: 118/60 105/68  110/70  Pulse: 80 93  94  Resp: 14 16  16   Temp: 97.9 F (36.6 C) 98 F (36.7 C)  98.2 F (36.8 C)  TempSrc: Oral Oral  Oral  SpO2: 99% 97% 100% 100%  Weight:      Height:        Intake/Output Summary (Last 24 hours) at 01/13/2018 1725 Last data filed at 01/13/2018 1700 Gross per 24 hour  Intake 1507.43 ml  Output 3420 ml  Net -1912.57 ml   Filed Weights   01/11/18 0236  Weight: 134.7 kg   Examination: Physical Exam:  Constitutional: Alert, well-developed obese Caucasian male currently no acute distress peers, comfortable and still complaining of some abdominal pain but denies that yesterday Eyes: Lids and conjunctive are normal.  Sclera anicteric ENMT: External ears and nose appear normal.  Grossly normal hearing.  Mucous members are moist Neck: Supple no JVD Respiratory: Diminished to auscultation bilaterally with no appreciable wheezing, rales, rhonchi.  Patient not tachypneic using accessory muscle breathe Cardiovascular: Regular rate and rhythm.  No appreciable murmurs, rubs, gallops.  Still very volume overloaded and  has 1+ lower extremity edema Abdomen: Soft, tender to palpate.  Distended secondary body habitus and he does have some fluid wave and ascites.  Bowel sounds present GU: Deferred Musculoskeletal: No contractures or cyanosis Skin: No appreciable rashes, lesions on limited on skin evaluation.  Has lower extremity changes venous stasis. Neurologic: Cranial nerves II through XII grossly intact no appreciable focal deficits.  Romberg sign and cerebellar reflexes were not assessed Psychiatric: Normal judgment and insight.  Patient is awake, alert and oriented x3.  Normal mood but  has somewhat of a flat affect  Data Reviewed: I have personally reviewed following labs and imaging studies  CBC: Recent Labs  Lab 01/07/18 1303 01/10/18 2256 01/11/18 0447 01/12/18 1001 01/13/18 0502  WBC 5.8 4.9 4.1 2.7* 3.4*  NEUTROABS 3.8 3.7  --  2.2 2.7  HGB 9.2* 9.3* 8.1* 8.1* 8.5*  HCT 27.6* 28.1* 24.9* 24.1* 24.8*  MCV 100.0* 102.9* 102.9* 101.7* 100.8*  PLT 110* 122* 111* 84* 75*   Basic Metabolic Panel: Recent Labs  Lab 01/07/18 1303 01/10/18 2256 01/11/18 0447 01/12/18 1001 01/13/18 0502  NA 137 137 139 140 134*  K 3.5 4.4 4.2 3.8 3.6  CL 99 101 103 98 93*  CO2 27 25 27 30  32  GLUCOSE 134* 209* 155* 288* 187*  BUN 11 24* 25* 24* 23  CREATININE 0.91 0.85 0.78 0.84 0.90  CALCIUM 8.0* 8.3* 8.2* 8.6* 8.4*  MG  --   --   --  1.3* 1.6*  PHOS  --   --   --  4.0 3.7   GFR: Estimated Creatinine Clearance: 113 mL/min (by C-G formula based on SCr of 0.9 mg/dL). Liver Function Tests: Recent Labs  Lab 01/07/18 1303 01/10/18 2256 01/11/18 0447 01/12/18 1001 01/13/18 0502  AST 34 40 31 45* 45*  ALT 28 29 26  32 37  ALKPHOS 108 82 73 89 88  BILITOT 1.2 1.3* 1.5* 1.8* 1.5*  PROT 6.9 6.5 6.2* 6.2* 6.1*  ALBUMIN 2.5* 2.5* 2.7* 2.9* 2.8*   Recent Labs  Lab 01/10/18 2256  LIPASE 49   Recent Labs  Lab 01/10/18 2256  AMMONIA 54*   Coagulation Profile: Recent Labs  Lab 01/10/18 2140  INR 1.04   Cardiac Enzymes: No results for input(s): CKTOTAL, CKMB, CKMBINDEX, TROPONINI in the last 168 hours. BNP (last 3 results) No results for input(s): PROBNP in the last 8760 hours. HbA1C: No results for input(s): HGBA1C in the last 72 hours. CBG: Recent Labs  Lab 01/12/18 1153 01/12/18 1736 01/12/18 2116 01/13/18 0736 01/13/18 1211  GLUCAP 254* 197* 226* 166* 242*   Lipid Profile: No results for input(s): CHOL, HDL, LDLCALC, TRIG, CHOLHDL, LDLDIRECT in the last 72 hours. Thyroid Function Tests: No results for input(s): TSH, T4TOTAL, FREET4, T3FREE,  THYROIDAB in the last 72 hours. Anemia Panel: No results for input(s): VITAMINB12, FOLATE, FERRITIN, TIBC, IRON, RETICCTPCT in the last 72 hours. Sepsis Labs: Recent Labs  Lab 01/10/18 2220 01/11/18 0447 01/13/18 0502  PROCALCITON  --  0.28  --   LATICACIDVEN 2.72*  --  1.0    Recent Results (from the past 240 hour(s))  Urine culture     Status: None   Collection Time: 01/10/18  9:40 PM  Result Value Ref Range Status   Specimen Description   Final    URINE, RANDOM Performed at [facility] [address] [place], [place] [address]    Special Requests   Final    NONE Performed at [facility]  [facility] [address], [place], [place] [address]    Culture   Final    NO GROWTH Performed at [facility], [place] [address] [place], [place] [address]    Report Status 01/12/2018 FINAL  Final  Culture, blood (x 2)     Status: None (Preliminary result)   Collection Time: 01/11/18  4:47 AM  Result Value Ref Range Status   Specimen Description   Final    BLOOD RIGHT HAND Performed at [facility] [address], [place], [place] [address]    Special Requests   Final    BOTTLES DRAWN AEROBIC ONLY Blood Culture adequate volume Performed at [facility] [address], [place], [place] [address]    Culture   Final    NO GROWTH 2 DAYS Performed at [facility] [address] [place], [place] [address]    Report Status PENDING  Incomplete  Culture, blood (x 2)     Status: None (Preliminary result)   Collection Time: 01/11/18  4:47 AM  Result Value Ref Range Status   Specimen Description   Final    BLOOD RIGHT HAND Performed at [facility] [address] [place], [place] [address]    Special Requests   Final    BOTTLES DRAWN AEROBIC ONLY Blood Culture adequate volume Performed at [facility] [address] [place], [place] [address]    Culture   Final     NO GROWTH 2 DAYS Performed at [facility] [address], [place], [place] [address]    Report Status PENDING  Incomplete  Gram stain     Status: None   Collection Time: 01/11/18 11:27 AM  Result Value Ref Range Status   Specimen Description FLUID LEFT ABDOMEN  Final   Special Requests   Final    NONE Performed at [facility] [address], [place], [place] [address]    Gram Stain   Final    RARE WBC PRESENT, PREDOMINANTLY MONONUCLEAR NO ORGANISMS SEEN Performed at [facility], [place] [address], [place], [place] [address]    Report Status 01/12/2018 FINAL  Final  Culture, body fluid-bottle     Status: None (Preliminary result)   Collection Time: 01/11/18 11:27 AM  Result Value Ref Range Status   Specimen Description FLUID LEFT ABDOMEN  Final   Special Requests BOTTLES DRAWN AEROBIC AND ANAEROBIC  Final   Culture   Final    NO GROWTH 2 DAYS Performed at [facility], [place] [address] [place], [place] [address]    Report Status PENDING  Incomplete    Radiology Studies: No results found. Scheduled Meds: . carvedilol  3.125 mg Oral BID WC  . feeding supplement (PRO-STAT SUGAR FREE 64)  30 mL Oral BID  . furosemide  60 mg Intravenous BID  . insulin aspart  0-5 Units Subcutaneous QHS  . insulin aspart  0-9 Units Subcutaneous TID WC  . loratadine  10 mg Oral Daily  . nicotine  14 mg Transdermal Daily  . omega-3 acid ethyl esters  1 g Oral BID  . pantoprazole  40 mg Oral Daily  . vitamin B-12  100 mcg Oral Daily   Continuous Infusions: . sodium chloride 10 mL/hr at 01/13/18 1700  . ciprofloxacin Stopped (01/13/18 1214)    LOS: 1 day   Kerney Elbe, DO [facility] PAGER is on [place]  If  7PM-7AM, please contact night-coverage [email or web address] Password [email or web address] 01/13/2018, 5:25 PM

## 2018-01-13 NOTE — Progress Notes (Signed)
Inpatient Diabetes Program Recommendations  AACE/ADA: New Consensus Statement on Inpatient Glycemic Control (2015)  Target Ranges:  Prepandial:   less than 140 mg/dL      Peak postprandial:   less than 180 mg/dL (1-2 hours)      Critically ill patients:  140 - 180 mg/dL   Lab Results  Component Value Date   GLUCAP 242 (H) 01/13/2018   HGBA1C 7.2 (H) 09/28/2017    Results for JAEDAN, HUTTNER (MRN [id number]) as of 01/13/2018 16:46  Ref. Range 01/12/2018 11:53 01/12/2018 17:36 01/12/2018 21:16 01/13/2018 07:36 01/13/2018 12:11  Glucose-Capillary Latest Ref Range: 70 - 99 mg/dL 254 (H) 197 (H) 226 (H) 166 (H) 242 (H)     Home DM meds: Glucophage 1000mg  BID; Novolog SSI (no frequency listed in home med list)  Current DM orders: Novolog sensitive correction (0-9 units) tid and (0-5 units) hs   MD please consider the following inpatient diabetes recommendations:  Add Novolog 3 units tid with meals  (Please add the following hold parameters:  Hold if NPO , Hold if patient eats less than 50% of meal.)  Thank you.   -- Will follow during hospitalization.--  Jonna Clark RN, MSN Diabetes Coordinator Inpatient Glycemic Control Team Team Pager: [phone] (8am-5pm)

## 2018-01-14 LAB — GLUCOSE, CAPILLARY
GLUCOSE-CAPILLARY: 222 mg/dL — AB (ref 70–99)
Glucose-Capillary: 168 mg/dL — ABNORMAL HIGH (ref 70–99)
Glucose-Capillary: 246 mg/dL — ABNORMAL HIGH (ref 70–99)
Glucose-Capillary: 223 mg/dL — ABNORMAL HIGH (ref 70–99)

## 2018-01-14 LAB — COMPREHENSIVE METABOLIC PANEL
ALT: 34 U/L (ref 0–44)
AST: 40 U/L (ref 15–41)
Albumin: 2.7 g/dL — ABNORMAL LOW (ref 3.5–5.0)
Alkaline Phosphatase: 86 U/L (ref 38–126)
Anion gap: 11 (ref 5–15)
BUN: 26 mg/dL — AB (ref 8–23)
CHLORIDE: 91 mmol/L — AB (ref 98–111)
CO2: 32 mmol/L (ref 22–32)
CREATININE: 0.87 mg/dL (ref 0.61–1.24)
Calcium: 8.4 mg/dL — ABNORMAL LOW (ref 8.9–10.3)
GFR calc Af Amer: >60 mL/min (ref >60)
Glucose, Bld: 192 mg/dL — ABNORMAL HIGH (ref 70–99)
Potassium: 3.4 mmol/L — ABNORMAL LOW (ref 3.5–5.1)
Sodium: 134 mmol/L — ABNORMAL LOW (ref 135–145)
Total Bilirubin: 1.5 mg/dL — ABNORMAL HIGH (ref 0.3–1.2)
Total Protein: 6.1 g/dL — ABNORMAL LOW (ref 6.5–8.1)

## 2018-01-14 LAB — CBC WITH DIFFERENTIAL/PLATELET
Basophils Absolute: 0 10*3/uL (ref 0.0–0.1)
Basophils Relative: 1 %
EOS PCT: 1 %
Eosinophils Absolute: 0 10*3/uL (ref 0.0–0.7)
HCT: 22.6 % — ABNORMAL LOW (ref 39.0–52.0)
HEMOGLOBIN: 7.7 g/dL — AB (ref 13.0–17.0)
LYMPHS ABS: 0.7 10*3/uL (ref 0.7–4.0)
LYMPHS PCT: 28 %
MCH: 33.9 pg (ref 26.0–34.0)
MCHC: 34.1 g/dL (ref 30.0–36.0)
MCV: 99.6 fL (ref 78.0–100.0)
Monocytes Absolute: 0 10*3/uL — ABNORMAL LOW (ref 0.1–1.0)
Monocytes Relative: 0 %
NEUTROS ABS: 1.9 10*3/uL (ref 1.7–7.7)
NEUTROS PCT: 70 %
PLATELETS: 56 10*3/uL — AB (ref 150–400)
RBC: 2.27 MIL/uL — AB (ref 4.22–5.81)
RDW: 16.8 % — ABNORMAL HIGH (ref 11.5–15.5)
WBC: 2.7 10*3/uL — AB (ref 4.0–10.5)

## 2018-01-14 LAB — PHOSPHORUS: Phosphorus: 3.6 mg/dL (ref 2.5–4.6)

## 2018-01-14 LAB — MAGNESIUM: Magnesium: 1.7 mg/dL (ref 1.7–2.4)

## 2018-01-14 LAB — PATHOLOGIST SMEAR REVIEW

## 2018-01-14 MED ORDER — FUROSEMIDE 10 MG/ML IJ SOLN
80.0000 mg | Freq: Two times a day (BID) | INTRAMUSCULAR | Status: DC
  Administered 2018-01-15 – 2018-01-17 (×5): 80 mg via INTRAVENOUS
  Filled 2018-01-14 (×5): qty 8

## 2018-01-14 MED ORDER — POTASSIUM CHLORIDE CRYS ER 20 MEQ PO TBCR
40.0000 meq | EXTENDED_RELEASE_TABLET | Freq: Two times a day (BID) | ORAL | Status: AC
  Administered 2018-01-14 (×2): 40 meq via ORAL
  Filled 2018-01-14 (×2): qty 2

## 2018-01-14 MED ORDER — FUROSEMIDE 10 MG/ML IJ SOLN
40.0000 mg | Freq: Once | INTRAMUSCULAR | Status: AC
  Administered 2018-01-14: 40 mg via INTRAVENOUS
  Filled 2018-01-14: qty 4

## 2018-01-14 NOTE — Progress Notes (Signed)
PT Cancellation Note  Patient Details Name: Robert Compton MRN: [id number] DOB: Oct 19, 1945   Cancelled Treatment:    Reason Eval/Treat Not Completed: Other (comment); attempted PT earlier this date, but pt refused mobility due to company in the room.  Unable to return due to schedule.  Will attempt another day.   Reginia Naas 01/14/2018, 3:52 PM Magda Kiel, [place] [phone] 01/14/2018

## 2018-01-14 NOTE — Progress Notes (Signed)
PROGRESS NOTE    Robert Compton  OEV:[id number] DOB: Oct 31, 1945 DOA: 01/10/2018 PCP: [name], MD  Brief Narrative:  The patient is a 72 year old Caucasian male with past medical history significant for metastatic small cell lung carcinoma with mets to the liver and cervical spine history of chemotherapy-induced pancytopenia, diabetes mellitus, GERD, Chronic CHF and other comorbidities who presented with a complaint of abdominal pain.  Of note patient was recently hospitalized from 8/30-9/10 because of anasarca and SBP and underwent therapeutic paracentesis at that time and was treated with antibiotics.  Over the last 4 days since receiving chemotherapy he is gained 14.8 pounds and has had worsening dyspnea abdominal distention and bilateral lower extremity edema associated with right-sided abdominal pain.  He was worked up and admitted to the hospital for anasarca and a paracentesis was done and patient had 5.2 Liters removed.    Patient was placed on IV ceftriaxone for concern for SBP given his abdominal pain. He sees [name] in outpatient setting for his primary small cell lung carcinoma with liver metastasis. He is diuresing but will up his Lasix as he is still extremely volume overloaded.  **Underwent therapeutic paracentesis and drain 5.2 L off.  We will continue antibiotics for now as well as continue with IV diuresis.  Discussed with oncology about giving Granix for the last and they did not advise at this time and Dr. Burr Medico agrees with my recommendation to hold these while he is in the hospital.  Still feel like he needs increased diuresis so IV Lasix increased to 80 twice daily today  Assessment & Plan:   Principal Problem:   Anasarca Active Problems:   Diabetes mellitus without complication ([place])   Essential hypertension   Extensive stage primary small cell carcinoma of lung (HCC)   Antineoplastic chemotherapy induced pancytopenia (HCC)   Liver metastasis (HCC)   Anemia   Severe protein-calorie malnutrition ([place])   Chronic diastolic CHF (congestive heart failure) (HCC)   Abdominal pain   Elevated lactic acid level  Anasarca, improving  slowly -likely multifactorial etiology, including hypoalbuminemia, liver dysfunction and dCHF.  Patient needed therapeutic Paracentesis and drained 5.2 Liters. -Lasix 40 bid IV increased to 60 mg BID on 01/12/18 we will increase to IV 80 twice daily today -Give albumin 100 g x 1 now -Continue to Monitor Volume Status Carefully  -Patient is - 11.180 L and care weights are not been done -PT OT to mobilize -Ordered TED hose however patient was unable to tolerate  Chronic diastolic CHF:  -2D echo on 09/28/2017 showed EF of 50-50% with grade 1 diastolic dysfunction.   -BNP 90.1.  Patient has anasarca and fluid overload, but no respiratory distress or shortness of breath. -On IV Lasix as above and increased it  Extensive stage primary small cell carcinoma of lung with Liver metastasis  -Patient is on chemotherapy every 3 weeks, followed up by [name].  Last dose of chemotherapy was the day before Admission. -Follow-up with [name] and I spoke with Dr. Morey Hummingbird who notified [name] of patient's admission and [name] to see the patient in the hospital possibly  Diabetes mellitus without complication ([place]) -Last A1c was 7.2, Not well controled. Patient is taking metformin and NovoLog at home -C/w Sensitive Novolog SSI AC/HS and will add Novolog 3 units 3 times daily with meals -Continue to Hold Metformin -CBGs ranging from 168-307 -Diabetes education coordinator insulted for further evaluation recommendations  HTN:  -Continue home medications: Coreg, -On IV Lasix as above  and will increase to 80 mg IV BID  -IV Hydralazine prn  Antineoplastic chemotherapy induced pancytopenia (HCC) -Admission CBC: WBC 4.9, hemoglobin 9.3 which was 9.2 on 01/07/2018, platelets 122, no bleeding tendency. -Follow-up with CBC  revealed WBC of 2.7, Hb/Hct of 7.7/22.6 and Platelet Count of 56 -ANC was 1.9 and no indication for Granix currently and I discussed the case with Dr. Morey Hummingbird who does not recommend administering Granix or Neulasta in the hospital currently -Repeat CBC with Differential in AM   Severe protein-calorie malnutrition [facility]): -Nutrition consult -Nutritionist recommending prostate liquid p.o. 30 mils twice daily with meals  Abdominal pain, slightly improved -Partially due to metastasized liver disease, but his abdominal pain has worsened than baseline.  Cannot completely rule out SBP. -Paracentesis done and SBP less likely -Started Ciprofloxacin per pharm (patient is allergic to amoxicillin with anaphylaxis reaction) and will continue for now and discontinue tomorrow -Blood Cultures x2 showed NGTD at 3 Day  -Pain Control with Oxycodone IR5 mg po q6hprn  Elevated Lactic Acid Level -Likely due to combination of metformin use and decreased clearance 2/2 liver dysfunction.  Patient does not seem be sepetic given no fever and leukocytosis -Will not start IV fluid due to fluid overload -Trend lactic acid level -Hold metformin -LA was 2.72; Repeat Level 1.0  Hypomagnesemia -Patient magnesium level this morning was 1.7 -Continue monitor and replete as necessary -Repeat Mag Level in AM  Hyponatremia -Patient's sodium dropped from 140 -> 134 and Chloride Dropped from 98 -> 91 -In the setting of diuresis -Continue monitor and trend and repeat CMP in a.m.  DVT prophylaxis: SCDs due to thrombocytopenia Code Status: FULL CODE Family Communication: No family present at bedside Disposition Plan: Pending Clinical Improvement and evaluation by physical therapy  Consultants:   IR  Will notify [name] via Epic and Dr. Burr Medico to notify [name] as well   Procedures: Paracentesis    Antimicrobials:  Anti-infectives (From admission, onward)   Start     Dose/Rate Route Frequency Ordered Stop     01/11/18 0130  ciprofloxacin (CIPRO) IVPB 400 mg     400 mg 200 mL/hr over 60 Minutes Intravenous 2 times daily 01/11/18 0119 01/15/18 2359     Subjective: Seen and examined at bedside and felt a little bit better states abdominal pain is not as bad.  Still feels very distended and bloated.  No chest pain, lightheadedness or dizziness.  No other concerns claims at this time.  Objective: Vitals:   01/13/18 1443 01/13/18 2041 01/14/18 0440 01/14/18 1317  BP: 110/70 119/68 119/65 (!) 102/54  Pulse: 94 89 89 88  Resp: 16 18 18 18   Temp: 98.2 F (36.8 C) 98.3 F (36.8 C) 97.9 F (36.6 C) 98 F (36.7 C)  TempSrc: Oral Oral Oral Oral  SpO2: 100% 97% 92% 95%  Weight:      Height:        Intake/Output Summary (Last 24 hours) at 01/14/2018 1826 Last data filed at 01/14/2018 1425 Gross per 24 hour  Intake 1129.95 ml  Output 4550 ml  Net -3420.05 ml   Filed Weights   01/11/18 0236  Weight: 134.7 kg   Examination: Physical Exam:  Constitutional: Well-nourished, well-developed obese Caucasian male is currently in no acute distress and feels slightly more improved than yesterday Eyes: Lids and conjunctive are normal.  Sclera anicteric ENMT: External ears and nose appear normal.  Grossly normal hearing.  Mucous members are moist Neck: Neck appears supple no JVD Respiratory: Diminished  to auscultation bilaterally no appreciable wheezing, rales, rhonchi.  Patient not tachypneic wheezing excess muscle breathe Cardiovascular: Regular rate and rhythm.  No appreciable murmurs, rubs, gallops.  Still very volume overloaded and still has 1+ lower extremity edema Abdomen: Soft, slightly tender to palpate.  Distended secondary body habitus and does have some fluid wave and ascites.  Bowel sounds present GU: Deferred Musculoskeletal: No contractures cyanosis.  No joint deformities noted Skin: No appreciable rashes or lesions limited skin evaluation.  Has lower extremity venous stasis changes  from long-term swelling Neurologic: Cranial nerves 2 through 12 grossly intact no appreciable focal deficits Psychiatric: Normal judgment and insight.  Patient is awake, alert, oriented x3  Data Reviewed: I have personally reviewed following labs and imaging studies  CBC: Recent Labs  Lab 01/10/18 2256 01/11/18 0447 01/12/18 1001 01/13/18 0502 01/14/18 0353  WBC 4.9 4.1 2.7* 3.4* 2.7*  NEUTROABS 3.7  --  2.2 2.7 1.9  HGB 9.3* 8.1* 8.1* 8.5* 7.7*  HCT 28.1* 24.9* 24.1* 24.8* 22.6*  MCV 102.9* 102.9* 101.7* 100.8* 99.6  PLT 122* 111* 84* 75* 56*   Basic Metabolic Panel: Recent Labs  Lab 01/10/18 2256 01/11/18 0447 01/12/18 1001 01/13/18 0502 01/14/18 0353  NA 137 139 140 134* 134*  K 4.4 4.2 3.8 3.6 3.4*  CL 101 103 98 93* 91*  CO2 25 27 30  32 32  GLUCOSE 209* 155* 288* 187* 192*  BUN 24* 25* 24* 23 26*  CREATININE 0.85 0.78 0.84 0.90 0.87  CALCIUM 8.3* 8.2* 8.6* 8.4* 8.4*  MG  --   --  1.3* 1.6* 1.7  PHOS  --   --  4.0 3.7 3.6   GFR: Estimated Creatinine Clearance: 116.9 mL/min (by C-G formula based on SCr of 0.87 mg/dL). Liver Function Tests: Recent Labs  Lab 01/10/18 2256 01/11/18 0447 01/12/18 1001 01/13/18 0502 01/14/18 0353  AST 40 31 45* 45* 40  ALT 29 26 32 37 34  ALKPHOS 82 73 89 88 86  BILITOT 1.3* 1.5* 1.8* 1.5* 1.5*  PROT 6.5 6.2* 6.2* 6.1* 6.1*  ALBUMIN 2.5* 2.7* 2.9* 2.8* 2.7*   Recent Labs  Lab 01/10/18 2256  LIPASE 49   Recent Labs  Lab 01/10/18 2256  AMMONIA 54*   Coagulation Profile: Recent Labs  Lab 01/10/18 2140  INR 1.04   Cardiac Enzymes: No results for input(s): CKTOTAL, CKMB, CKMBINDEX, TROPONINI in the last 168 hours. BNP (last 3 results) No results for input(s): PROBNP in the last 8760 hours. HbA1C: No results for input(s): HGBA1C in the last 72 hours. CBG: Recent Labs  Lab 01/13/18 1740 01/13/18 2016 01/14/18 0720 01/14/18 1153 01/14/18 1714  GLUCAP 182* 307* 168* 246* 222*   Lipid Profile: No results  for input(s): CHOL, HDL, LDLCALC, TRIG, CHOLHDL, LDLDIRECT in the last 72 hours. Thyroid Function Tests: No results for input(s): TSH, T4TOTAL, FREET4, T3FREE, THYROIDAB in the last 72 hours. Anemia Panel: No results for input(s): VITAMINB12, FOLATE, FERRITIN, TIBC, IRON, RETICCTPCT in the last 72 hours. Sepsis Labs: Recent Labs  Lab 01/10/18 2220 01/11/18 0447 01/13/18 0502  PROCALCITON  --  0.28  --   LATICACIDVEN 2.72*  --  1.0    Recent Results (from the past 240 hour(s))  Urine culture     Status: None   Collection Time: 01/10/18  9:40 PM  Result Value Ref Range Status   Specimen Description   Final    URINE, RANDOM Performed at [facility] [address] [place], [place]  [address]    Special Requests   Final    NONE Performed at [facility], [place] [address] [place], [place] [address]    Culture   Final    NO GROWTH Performed at [facility], [place] [address] [place], [place] [address]    Report Status 01/12/2018 FINAL  Final  Culture, blood (x 2)     Status: None (Preliminary result)   Collection Time: 01/11/18  4:47 AM  Result Value Ref Range Status   Specimen Description   Final    BLOOD RIGHT HAND Performed at [facility] [address], [place], [place] [address]    Special Requests   Final    BOTTLES DRAWN AEROBIC ONLY Blood Culture adequate volume Performed at [facility] [address], [place], [place] [address]    Culture   Final    NO GROWTH 3 DAYS Performed at [facility], [place] [address], [place], [place] [address]    Report Status PENDING  Incomplete  Culture, blood (x 2)     Status: None (Preliminary result)   Collection Time: 01/11/18  4:47 AM  Result Value Ref Range Status   Specimen Description   Final    BLOOD RIGHT HAND Performed at [facility] [address] [place], [place] [address]    Special Requests   Final     BOTTLES DRAWN AEROBIC ONLY Blood Culture adequate volume Performed at [facility] [address], [place], [place] [address]    Culture   Final    NO GROWTH 3 DAYS Performed at [facility], [place] [address] [place], [place] [address]    Report Status PENDING  Incomplete  Gram stain     Status: None   Collection Time: 01/11/18 11:27 AM  Result Value Ref Range Status   Specimen Description FLUID LEFT ABDOMEN  Final   Special Requests   Final    NONE Performed at [facility] [address], [place], [place] [address]    Gram Stain   Final    RARE WBC PRESENT, PREDOMINANTLY MONONUCLEAR NO ORGANISMS SEEN Performed at [facility], [place] [address] [place], [place] [address]    Report Status 01/12/2018 FINAL  Final  Culture, body fluid-bottle     Status: None (Preliminary result)   Collection Time: 01/11/18 11:27 AM  Result Value Ref Range Status   Specimen Description FLUID LEFT ABDOMEN  Final   Special Requests BOTTLES DRAWN AEROBIC AND ANAEROBIC  Final   Culture   Final    NO GROWTH 3 DAYS Performed at [facility], [place] [address], [place], [place] [address]    Report Status PENDING  Incomplete    Radiology Studies: No results found. Scheduled Meds: . carvedilol  3.125 mg Oral BID WC  . feeding supplement (PRO-STAT SUGAR FREE 64)  30 mL Oral BID  . furosemide  80 mg Intravenous BID  . insulin aspart  0-5 Units Subcutaneous QHS  . insulin aspart  0-9 Units Subcutaneous TID WC  . insulin aspart  3 Units Subcutaneous TID WC  . loratadine  10 mg Oral Daily  . nicotine  14 mg Transdermal Daily  . omega-3 acid ethyl esters  1 g Oral BID  . pantoprazole  40 mg Oral Daily  . potassium chloride  40 mEq Oral BID  . vitamin B-12  100 mcg Oral Daily   Continuous  Infusions: . sodium chloride 500 mL (01/14/18 0730)  . ciprofloxacin 400 mg (01/14/18 1004)    LOS: 2 days   Kerney Elbe, DO [facility] PAGER  is on [place]  If 7PM-7AM, please contact night-coverage [email or web address] Password [facility] 01/14/2018, 6:26 PM

## 2018-01-15 DIAGNOSIS — R140 Abdominal distension (gaseous): Secondary | ICD-10-CM

## 2018-01-15 LAB — CBC WITH DIFFERENTIAL/PLATELET
BASOS PCT: 1 %
Basophils Absolute: 0 10*3/uL (ref 0.0–0.1)
EOS ABS: 0 10*3/uL (ref 0.0–0.7)
Eosinophils Relative: 1 %
HCT: 21.9 % — ABNORMAL LOW (ref 39.0–52.0)
HEMOGLOBIN: 7.5 g/dL — AB (ref 13.0–17.0)
Lymphocytes Relative: 34 %
Lymphs Abs: 0.7 10*3/uL (ref 0.7–4.0)
MCH: 33.9 pg (ref 26.0–34.0)
MCHC: 34.2 g/dL (ref 30.0–36.0)
MCV: 99.1 fL (ref 78.0–100.0)
MONO ABS: 0 10*3/uL — AB (ref 0.1–1.0)
MONOS PCT: 2 %
Neutro Abs: 1.3 10*3/uL — ABNORMAL LOW (ref 1.7–7.7)
Neutrophils Relative %: 62 %
Platelets: 40 10*3/uL — ABNORMAL LOW (ref 150–400)
RBC: 2.21 MIL/uL — ABNORMAL LOW (ref 4.22–5.81)
RDW: 16.6 % — ABNORMAL HIGH (ref 11.5–15.5)
WBC: 2 10*3/uL — ABNORMAL LOW (ref 4.0–10.5)

## 2018-01-15 LAB — COMPREHENSIVE METABOLIC PANEL
ALBUMIN: 2.8 g/dL — AB (ref 3.5–5.0)
ALK PHOS: 95 U/L (ref 38–126)
ALT: 35 U/L (ref 0–44)
ANION GAP: 10 (ref 5–15)
AST: 36 U/L (ref 15–41)
BUN: 29 mg/dL — ABNORMAL HIGH (ref 8–23)
CALCIUM: 8.5 mg/dL — AB (ref 8.9–10.3)
CO2: 33 mmol/L — ABNORMAL HIGH (ref 22–32)
CREATININE: 0.98 mg/dL (ref 0.61–1.24)
Chloride: 92 mmol/L — ABNORMAL LOW (ref 98–111)
GFR calc Af Amer: >60 mL/min (ref >60)
GFR calc non Af Amer: >60 mL/min (ref >60)
GLUCOSE: 206 mg/dL — AB (ref 70–99)
Potassium: 3.7 mmol/L (ref 3.5–5.1)
Sodium: 135 mmol/L (ref 135–145)
TOTAL PROTEIN: 6.2 g/dL — AB (ref 6.5–8.1)
Total Bilirubin: 1.3 mg/dL — ABNORMAL HIGH (ref 0.3–1.2)

## 2018-01-15 LAB — GLUCOSE, CAPILLARY
GLUCOSE-CAPILLARY: 173 mg/dL — AB (ref 70–99)
GLUCOSE-CAPILLARY: 374 mg/dL — AB (ref 70–99)
Glucose-Capillary: 182 mg/dL — ABNORMAL HIGH (ref 70–99)
Glucose-Capillary: 174 mg/dL — ABNORMAL HIGH (ref 70–99)

## 2018-01-15 LAB — PHOSPHORUS: Phosphorus: 3.4 mg/dL (ref 2.5–4.6)

## 2018-01-15 LAB — MAGNESIUM: Magnesium: 1.6 mg/dL — ABNORMAL LOW (ref 1.7–2.4)

## 2018-01-15 MED ORDER — MAGNESIUM SULFATE 2 GM/50ML IV SOLN
2.0000 g | Freq: Once | INTRAVENOUS | Status: AC
  Administered 2018-01-15: 2 g via INTRAVENOUS
  Filled 2018-01-15: qty 50

## 2018-01-15 NOTE — Evaluation (Signed)
Physical Therapy Evaluation Patient Details Name: Robert Compton MRN: [id number] DOB: 11-19-1945 Today's Date: 01/15/2018   History of Present Illness  72 yo male admitted with anasarca. Hx of DM, met lung ca-on chemo, CHF  Clinical Impression  On eval, pt required Min guard-Min assist for mobility. He walked ~115 feet with a RW. Mild unsteadiness at times. Discussed d/c plan-pt plans to return home. Will recommend HHPT f/u, if pt is agreeable. Will follow during hospital stay.     Follow Up Recommendations Home health PT    Equipment Recommendations  None recommended by PT    Recommendations for Other Services       Precautions / Restrictions Precautions Precautions: Fall Restrictions Weight Bearing Restrictions: No      Mobility  Bed Mobility Overal bed mobility: Modified Independent       Supine to sit: HOB elevated;Modified independent (Device/Increase time) Sit to supine: HOB elevated;Modified independent (Device/Increase time)      Transfers Overall transfer level: Needs assistance Equipment used: Rolling walker (2 wheeled) Transfers: Sit to/from Stand Sit to Stand: Min assist;From elevated surface         General transfer comment: Assist to rise. VCs safety, hand placement.   Ambulation/Gait Ambulation/Gait assistance: Min guard Gait Distance (Feet): 115 Feet Assistive device: Rolling walker (2 wheeled) Gait Pattern/deviations: Step-through pattern;Decreased stride length;Trunk flexed     General Gait Details: close guard for safety. slow gait speed.   Stairs            Wheelchair Mobility    Modified Rankin (Stroke Patients Only)       Balance Overall balance assessment: Mild deficits observed, not formally tested                                           Pertinent Vitals/Pain Pain Assessment: No/denies pain    Home Living Family/patient expects to be discharged to:: Private residence Living Arrangements:  Children Available Help at Discharge: Family;Available PRN/intermittently Type of Home: House Home Access: Stairs to enter Entrance Stairs-Rails: None Entrance Stairs-Number of Steps: 2 Home Layout: One level Home Equipment: Walker - 2 wheels;Tub bench;Hospital bed;Bedside commode      Prior Function Level of Independence: Needs assistance   Gait / Transfers Assistance Needed: household ambulator with RW  ADL's / Homemaking Assistance Needed: sons helps with pants; socks and getting into shower        Hand Dominance        Extremity/Trunk Assessment   Upper Extremity Assessment Upper Extremity Assessment: Generalized weakness    Lower Extremity Assessment Lower Extremity Assessment: Generalized weakness    Cervical / Trunk Assessment Cervical / Trunk Assessment: Normal  Communication   Communication: No difficulties  Cognition Arousal/Alertness: Awake/alert Behavior During Therapy: WFL for tasks assessed/performed Overall Cognitive Status: Within Functional Limits for tasks assessed                                        General Comments      Exercises     Assessment/Plan    PT Assessment Patient needs continued PT services  PT Problem List Decreased strength;Decreased activity tolerance;Decreased mobility;Decreased balance       PT Treatment Interventions DME instruction;Gait training;Functional mobility training;Therapeutic activities;Balance training;Patient/family education;Therapeutic exercise    PT Goals (Current goals can  be found in the Care Plan section)  Acute Rehab PT Goals Patient Stated Goal: home PT Goal Formulation: With patient Time For Goal Achievement: 01/29/18 Potential to Achieve Goals: Fair    Frequency Min 3X/week   Barriers to discharge        Co-evaluation               AM-PAC PT "6 Clicks" Daily Activity  Outcome Measure Difficulty turning over in bed (including adjusting bedclothes, sheets and  blankets)?: A Little Difficulty moving from lying on back to sitting on the side of the bed? : A Little Difficulty sitting down on and standing up from a chair with arms (e.g., wheelchair, bedside commode, etc,.)?: Unable Help needed moving to and from a bed to chair (including a wheelchair)?: A Little Help needed walking in hospital room?: A Little Help needed climbing 3-5 steps with a railing? : A Lot 6 Click Score: 15    End of Session   Activity Tolerance: Patient tolerated treatment well Patient left: in bed;with call bell/phone within reach;with bed alarm set   PT Visit Diagnosis: Muscle weakness (generalized) (M62.81);Unsteadiness on feet (R26.81)    Time: 1406-1430 PT Time Calculation (min) (ACUTE ONLY): 24 min   Charges:   PT Evaluation $PT Eval Moderate Complexity: 1 Mod PT Treatments $Gait Training: 8-22 mins         Weston Anna, PT Acute Rehabilitation Services Pager: [phone] Office: [phone]

## 2018-01-15 NOTE — Progress Notes (Addendum)
[facility]  PROGRESS NOTE  Robert Compton PYP:[id number] DOB: 02-14-1946 DOA: 01/10/2018 PCP: [name], MD   Brief HPI:   72 year old Caucasian male with past medical history significant for metastatic small cell lung carcinoma with mets to the liver and cervical spine history of chemotherapy-induced pancytopenia, diabetes mellitus, GERD, Chronic CHF and other comorbidities who presented with a complaint of abdominal pain.  Underwent therapeutic paracentesis and drain 5.2 L off.  We will continue antibiotics for now as well as continue with IV diuresis.  Discussed with oncology about giving Granix for the last and they did not advise at this time and Dr. Burr Medico agrees with my recommendation to hold these while he is in the hospital.  Still feel like he needs increased diuresis so IV Lasix increased to 80 twice daily today.    Subjective   Patient seen and examined, continues to diurese with IV Lasix.  Anasarca is slowly improving.  Net -11.6 L   Assessment/Plan:     1. Anasarca-multifactorial from hypoalbuminemia, chronic liver disease, diastolic CHF.  Patient status post paracentesis with 5.2 L drained.  Continue Lasix 80 mg twice daily.  Net -11.6 L.  Will closely monitor renal function.  2. Hypomagnesemia-magnesium is still 1.6, will give 2 g of mag sulfate IV x1.  Check serum magnesium level in a.m.  3. Small cell carcinoma of lung with liver metastasis-patient is on chemotherapy every 3 weeks, followed by [name] as outpatient.  Status post paracentesis, started on ciprofloxacin per pharmacy for?  SBP.  Blood cultures x2 showed no growth.  Continue PRN oxycodone  4. Chemotherapy-induced pancytopenia-patient's WBC, platelet count reducing every day.  Dr. Shelton Silvas had discussed with Dr. Annamaria Boots who did not recommend Granix at this time.  Follow CBC in a.m.  No signs and symptoms of infection.  5. Hypertension-blood pressure stable, continue Coreg, PRN  hydralazine  6. Diabetes mellitus type 2-continue sliding scale insulin with NovoLog, blood glucose is mildly elevated .  Continue sliding scale insulin with NovoLog, 3 units NovoLog meal coverage.  Might add Lantus if blood glucose continues to be elevated.     CBG: Recent Labs  Lab 01/14/18 1714 01/14/18 2123 01/15/18 0741 01/15/18 1153 01/15/18 1732  GLUCAP 222* 223* 173* 374* 182*    CBC: Recent Labs  Lab 01/10/18 2256 01/11/18 0447 01/12/18 1001 01/13/18 0502 01/14/18 0353 01/15/18 0513  WBC 4.9 4.1 2.7* 3.4* 2.7* 2.0*  NEUTROABS 3.7  --  2.2 2.7 1.9 1.3*  HGB 9.3* 8.1* 8.1* 8.5* 7.7* 7.5*  HCT 28.1* 24.9* 24.1* 24.8* 22.6* 21.9*  MCV 102.9* 102.9* 101.7* 100.8* 99.6 99.1  PLT 122* 111* 84* 75* 56* 40*    Basic Metabolic Panel: Recent Labs  Lab 01/11/18 0447 01/12/18 1001 01/13/18 0502 01/14/18 0353 01/15/18 0513  NA 139 140 134* 134* 135  K 4.2 3.8 3.6 3.4* 3.7  CL 103 98 93* 91* 92*  CO2 27 30 32 32 33*  GLUCOSE 155* 288* 187* 192* 206*  BUN 25* 24* 23 26* 29*  CREATININE 0.78 0.84 0.90 0.87 0.98  CALCIUM 8.2* 8.6* 8.4* 8.4* 8.5*  MG  --  1.3* 1.6* 1.7 1.6*  PHOS  --  4.0 3.7 3.6 3.4     DVT prophylaxis: SCDs  Code Status: Full code  Family Communication: No family at bedside  Disposition Plan: likely home when medically ready for discharge   Consultants:  IR  Procedures:  None   Antibiotics:   Anti-infectives (From admission, onward)  Start     Dose/Rate Route Frequency Ordered Stop   01/11/18 0130  ciprofloxacin (CIPRO) IVPB 400 mg     400 mg 200 mL/hr over 60 Minutes Intravenous 2 times daily 01/11/18 0119 01/15/18 2359       Objective   Vitals:   01/14/18 1317 01/14/18 2119 01/15/18 0443 01/15/18 1451  BP: (!) 102/54 93/77 120/60 102/60  Pulse: 88 89 84 83  Resp: 18 20 16 16   Temp: 98 F (36.7 C) 99.7 F (37.6 C) 98.2 F (36.8 C) 98.2 F (36.8 C)  TempSrc: Oral Oral Oral Oral  SpO2: 95% 96% 93% 100%   Weight:      Height:        Intake/Output Summary (Last 24 hours) at 01/15/2018 1753 Last data filed at 01/15/2018 1451 Gross per 24 hour  Intake 2333.5 ml  Output 2850 ml  Net -516.5 ml   Filed Weights   01/11/18 0236  Weight: 134.7 kg     Physical Examination:    General: Appears in no acute distress  Cardiovascular: S1-S2, regular  Respiratory: Clear to auscultation bilaterally, no wheezing or crackles auscultated  Abdomen: Soft, nontender, no organomegaly  Extremities: Bilateral 2+ pitting edema of lower extremities  Neurologic: Alert, oriented x3     Data Reviewed: I have personally reviewed following labs and imaging studies   Recent Results (from the past 240 hour(s))  Urine culture     Status: None   Collection Time: 01/10/18  9:40 PM  Result Value Ref Range Status   Specimen Description   Final    URINE, RANDOM Performed at [facility], [place] [address], [place], [place] [address]    Special Requests   Final    NONE Performed at [facility], [place] [address] [place], [place] [address]    Culture   Final    NO GROWTH Performed at [facility], [place] [address], [place], [place] [address]    Report Status 01/12/2018 FINAL  Final  Culture, blood (x 2)     Status: None (Preliminary result)   Collection Time: 01/11/18  4:47 AM  Result Value Ref Range Status   Specimen Description   Final    BLOOD RIGHT HAND Performed at [facility] [address] [place], [place] [address]    Special Requests   Final    BOTTLES DRAWN AEROBIC ONLY Blood Culture adequate volume Performed at [facility] [place] [address] [place], [place] [address]    Culture   Final    NO GROWTH 4 DAYS Performed at [facility], [place] [address], [place], [place] [address]    Report Status PENDING  Incomplete  Culture, blood (x 2)     Status: None (Preliminary result)   Collection Time:  01/11/18  4:47 AM  Result Value Ref Range Status   Specimen Description   Final    BLOOD RIGHT HAND Performed at [facility] [address], [place], [place] [address]    Special Requests   Final    BOTTLES DRAWN AEROBIC ONLY Blood Culture adequate volume Performed at [facility] [address] [place], [place] [address]    Culture   Final    NO GROWTH 4 DAYS Performed at [facility], [place] [address] [place], [place] [address]    Report Status PENDING  Incomplete  Gram stain     Status: None   Collection  Time: 01/11/18 11:27 AM  Result Value Ref Range Status   Specimen Description FLUID LEFT ABDOMEN  Final   Special Requests   Final    NONE Performed at [facility] [address], [place], [place] [address]    Gram Stain   Final    RARE WBC PRESENT, PREDOMINANTLY MONONUCLEAR NO ORGANISMS SEEN Performed at [facility], [place] [address], [place], [place] [address]    Report Status 01/12/2018 FINAL  Final  Culture, body fluid-bottle     Status: None (Preliminary result)   Collection Time: 01/11/18 11:27 AM  Result Value Ref Range Status   Specimen Description FLUID LEFT ABDOMEN  Final   Special Requests BOTTLES DRAWN AEROBIC AND ANAEROBIC  Final   Culture   Final    NO GROWTH 4 DAYS Performed at [facility], [place] [address] [place], [place] [address]    Report Status PENDING  Incomplete     Liver Function Tests: Recent Labs  Lab 01/11/18 0447 01/12/18 1001 01/13/18 0502 01/14/18 0353 01/15/18 0513  AST 31 45* 45* 40 36  ALT 26 32 37 34 35  ALKPHOS 73 89 88 86 95  BILITOT 1.5* 1.8* 1.5* 1.5* 1.3*  PROT 6.2* 6.2* 6.1* 6.1* 6.2*  ALBUMIN 2.7* 2.9* 2.8* 2.7* 2.8*   Recent Labs  Lab 01/10/18 2256  LIPASE 49   Recent Labs  Lab 01/10/18 2256  AMMONIA 54*    Cardiac Enzymes: No results for input(s): CKTOTAL, CKMB, CKMBINDEX, TROPONINI in the last 168 hours. BNP (last 3  results) Recent Labs    11/26/17 1640 12/13/17 2211 01/10/18 2256  BNP 116.9* 63.1 90.1    ProBNP (last 3 results) No results for input(s): PROBNP in the last 8760 hours.    Studies: No results found.  Scheduled Meds: . carvedilol  3.125 mg Oral BID WC  . feeding supplement (PRO-STAT SUGAR FREE 64)  30 mL Oral BID  . furosemide  80 mg Intravenous BID  . insulin aspart  0-5 Units Subcutaneous QHS  . insulin aspart  0-9 Units Subcutaneous TID WC  . insulin aspart  3 Units Subcutaneous TID WC  . loratadine  10 mg Oral Daily  . nicotine  14 mg Transdermal Daily  . omega-3 acid ethyl esters  1 g Oral BID  . pantoprazole  40 mg Oral Daily  . vitamin B-12  100 mcg Oral Daily      Time spent: 25 min  [facility] Pager [phone]. If 7PM-7AM, please contact night-coverage at [email or web address], Office  [phone]  password [email or web address]  01/15/2018, 5:53 PM  LOS: 3 days

## 2018-01-15 NOTE — Evaluation (Signed)
Occupational Therapy Evaluation Patient Details Name: Robert Compton MRN: [id number] DOB: 1945-11-01 Today's Date: 01/15/2018    History of Present Illness Pt was admitted with anasarca and abdominal pain. PMH: HTN, DM, on chemo for metastatic small cell lung CA   Clinical Impression   This 72 year old man was admitted for the above.  He reports that son helps with LB adls and getting into shower; he has 24/7 but doesn't need assist beyond this.  Will focus on toileting in acute setting with mod I level goals     Follow Up Recommendations  Supervision - Intermittent    Equipment Recommendations  None recommended by OT    Recommendations for Other Services       Precautions / Restrictions Precautions Precautions: Fall Restrictions Weight Bearing Restrictions: No      Mobility Bed Mobility Overal bed mobility: Modified Independent             General bed mobility comments: HOB 30  Transfers   Equipment used: Rolling walker (2 wheeled)   Sit to Stand: Min assist         General transfer comment: Assist to stand and stabilize    Balance     Sitting balance-Leahy Scale: Good     Standing balance support: (RW for support)                               ADL either performed or assessed with clinical judgement   ADL Overall ADL's : Needs assistance/impaired             Lower Body Bathing: Minimal assistance;Sit to/from stand       Lower Body Dressing: Maximal assistance;Sit to/from stand                 General ADL Comments: pt needs min guard for sit to stand for ADLs:  mod I at baseline.  He can perform UB adls with set up     Vision         Perception     Praxis      Pertinent Vitals/Pain Pain Assessment: No/denies pain     Hand Dominance     Extremity/Trunk Assessment Upper Extremity Assessment Upper Extremity Assessment: Overall WFL for tasks assessed           Communication  Communication Communication: No difficulties   Cognition Arousal/Alertness: Awake/alert Behavior During Therapy: WFL for tasks assessed/performed Overall Cognitive Status: Within Functional Limits for tasks assessed                                     General Comments  sat x 15 min EOB. Pt requests 4 bedrails up    Exercises     Shoulder Instructions      Home Living Family/patient expects to be discharged to:: Private residence Living Arrangements: Children                 Bathroom Shower/Tub: Teaching laboratory technician Toilet: Handicapped height     Home Equipment: Environmental consultant - 2 wheels;Tub bench;Hospital bed;Bedside commode          Prior Functioning/Environment Level of Independence: Needs assistance  Gait / Transfers Assistance Needed: household ambulator with RW ADL's / Homemaking Assistance Needed: sons helps with pants; socks and getting into shower  OT Problem List: Decreased strength;Decreased activity tolerance;Impaired balance (sitting and/or standing)      OT Treatment/Interventions: Self-care/ADL training;Energy conservation;DME and/or AE instruction;Balance training;Patient/family education    OT Goals(Current goals can be found in the care plan section) Acute Rehab OT Goals Patient Stated Goal: to go home as soon as possible OT Goal Formulation: With patient Time For Goal Achievement: 01/29/18 Potential to Achieve Goals: Good ADL Goals Pt Will Transfer to Toilet: with modified independence;bedside commode;ambulating Pt Will Perform Toileting - Clothing Manipulation and hygiene: with modified independence;sit to/from stand;sitting/lateral leans  OT Frequency: Min 2X/week   Barriers to D/C:            Co-evaluation              AM-PAC PT "6 Clicks" Daily Activity     Outcome Measure Help from another person eating meals?: None Help from another person taking care of personal grooming?: A Little Help from  another person toileting, which includes using toliet, bedpan, or urinal?: A Little Help from another person bathing (including washing, rinsing, drying)?: A Little Help from another person to put on and taking off regular upper body clothing?: A Little Help from another person to put on and taking off regular lower body clothing?: A Lot 6 Click Score: 18   End of Session    Activity Tolerance: Patient tolerated treatment well Patient left: in bed;with call bell/phone within reach  OT Visit Diagnosis: Muscle weakness (generalized) (M62.81)                Time: 1751-0258 OT Time Calculation (min): 27 min Charges:  OT General Charges $OT Visit: 1 Visit OT Evaluation $OT Eval Low Complexity: 1 Low OT Treatments $Therapeutic Activity: 8-22 mins  Lesle Chris, OTR/L Acute Rehabilitation Services [phone] [facility] pager [phone] office 01/15/2018  [place] 01/15/2018, 9:08 AM

## 2018-01-15 NOTE — Progress Notes (Signed)
Inpatient Diabetes Program Recommendations  AACE/ADA: New Consensus Statement on Inpatient Glycemic Control (2015)  Target Ranges:  Prepandial:   less than 140 mg/dL      Peak postprandial:   less than 180 mg/dL (1-2 hours)      Critically ill patients:  140 - 180 mg/dL   Lab Results  Component Value Date   GLUCAP 374 (H) 01/15/2018   HGBA1C 7.2 (H) 09/28/2017    Results for RAYNALDO, FALCO (MRN [id number]) as of 01/15/2018 12:43  Ref. Range 01/14/2018 11:53 01/14/2018  17:14 01/14/2018 21:23 01/15/2018 07:41 01/15/2018 11:53  Glucose-Capillary Latest Ref Range: 70 - 99 mg/dL 246 (H)  3 units Novolog correction  222 (H)  3 units Novolog correction  223 (H)  2 units Novolog correction 173 (H)  3 units Novolog correction 374 (H)  9 units Novolog correction    Home DM meds: Glucophage 1000mg  BID                              Novolog SSI (no frequency listed in home med list)  Current DM orders: Novolog sensitive correction (0-9 units) tid and (0-5 units) hs                                  Novolog 3 units meal coverage    CBG continue to run high. Within past 24 hour period patient has required 20 units of Novolog insulin. This is NOT including the 3 units of meal coverage which would bring the total Novolog to 29 units (20 units coverage + 9 units meal coverage) ovet the past 24 hours.    MD please consider the following inpatient insulin adjustments:        1.  Add basal insulin. Recommend Lantus 10 units q hs.         2. Increase meal coverage to Novolog 4 units tid ac.           Thank you.  -- Will follow during hospitalization.--  Jonna Clark RN, MSN Diabetes Coordinator Inpatient Glycemic Control Team Team Pager: [phone] (8am-5pm)

## 2018-01-16 LAB — COMPREHENSIVE METABOLIC PANEL
ALT: 35 U/L (ref 0–44)
ANION GAP: 10 (ref 5–15)
AST: 38 U/L (ref 15–41)
Albumin: 2.7 g/dL — ABNORMAL LOW (ref 3.5–5.0)
Alkaline Phosphatase: 97 U/L (ref 38–126)
BUN: 26 mg/dL — ABNORMAL HIGH (ref 8–23)
CHLORIDE: 91 mmol/L — AB (ref 98–111)
CO2: 33 mmol/L — ABNORMAL HIGH (ref 22–32)
Calcium: 8.3 mg/dL — ABNORMAL LOW (ref 8.9–10.3)
Creatinine, Ser: 0.97 mg/dL (ref 0.61–1.24)
GFR calc Af Amer: >60 mL/min (ref >60)
Glucose, Bld: 239 mg/dL — ABNORMAL HIGH (ref 70–99)
Potassium: 3.2 mmol/L — ABNORMAL LOW (ref 3.5–5.1)
Sodium: 134 mmol/L — ABNORMAL LOW (ref 135–145)
Total Bilirubin: 1 mg/dL (ref 0.3–1.2)
Total Protein: 5.9 g/dL — ABNORMAL LOW (ref 6.5–8.1)

## 2018-01-16 LAB — GLUCOSE, CAPILLARY
GLUCOSE-CAPILLARY: 252 mg/dL — AB (ref 70–99)
GLUCOSE-CAPILLARY: 205 mg/dL — AB (ref 70–99)
Glucose-Capillary: 219 mg/dL — ABNORMAL HIGH (ref 70–99)
Glucose-Capillary: 216 mg/dL — ABNORMAL HIGH (ref 70–99)

## 2018-01-16 LAB — CBC
HCT: 21.1 % — ABNORMAL LOW (ref 39.0–52.0)
HEMOGLOBIN: 7.3 g/dL — AB (ref 13.0–17.0)
MCH: 34.4 pg — ABNORMAL HIGH (ref 26.0–34.0)
MCHC: 34.6 g/dL (ref 30.0–36.0)
MCV: 99.5 fL (ref 78.0–100.0)
PLATELETS: 25 10*3/uL — AB (ref 150–400)
RBC: 2.12 MIL/uL — ABNORMAL LOW (ref 4.22–5.81)
RDW: 16.6 % — ABNORMAL HIGH (ref 11.5–15.5)
WBC: 1.5 10*3/uL — AB (ref 4.0–10.5)

## 2018-01-16 LAB — CULTURE, BODY FLUID-BOTTLE: CULTURE: NO GROWTH

## 2018-01-16 LAB — MAGNESIUM: Magnesium: 1.5 mg/dL — ABNORMAL LOW (ref 1.7–2.4)

## 2018-01-16 LAB — CULTURE, BLOOD (ROUTINE X 2)
CULTURE: NO GROWTH
Culture: NO GROWTH
SPECIAL REQUESTS: ADEQUATE
SPECIAL REQUESTS: ADEQUATE

## 2018-01-16 MED ORDER — INSULIN GLARGINE 100 UNIT/ML ~~LOC~~ SOLN
10.0000 [IU] | Freq: Every day | SUBCUTANEOUS | Status: DC
  Administered 2018-01-16 – 2018-01-17 (×2): 10 [IU] via SUBCUTANEOUS
  Filled 2018-01-16 (×3): qty 0.1

## 2018-01-16 MED ORDER — MAGNESIUM SULFATE 2 GM/50ML IV SOLN
2.0000 g | Freq: Once | INTRAVENOUS | Status: AC
  Administered 2018-01-16: 2 g via INTRAVENOUS
  Filled 2018-01-16: qty 50

## 2018-01-16 MED ORDER — POTASSIUM CHLORIDE CRYS ER 20 MEQ PO TBCR
40.0000 meq | EXTENDED_RELEASE_TABLET | ORAL | Status: AC
  Administered 2018-01-16 (×2): 40 meq via ORAL
  Filled 2018-01-16 (×2): qty 2

## 2018-01-16 MED ORDER — POTASSIUM CHLORIDE CRYS ER 20 MEQ PO TBCR: 40.0000 meq | EXTENDED_RELEASE_TABLET | ORAL | Status: DC

## 2018-01-16 NOTE — Progress Notes (Signed)
Inpatient Diabetes Program Recommendations  AACE/ADA: New Consensus Statement on Inpatient Glycemic Control (2015)  Target Ranges:  Prepandial:   less than 140 mg/dL      Peak postprandial:   less than 180 mg/dL (1-2 hours)      Critically ill patients:  140 - 180 mg/dL   Lab Results  Component Value Date   GLUCAP 252 (H) 01/16/2018   HGBA1C 7.2 (H) 09/28/2017    Review of Glycemic Control  Blood sugars continue > 180 mg/dL. May benefit from addition of basal insulin.  Inpatient Diabetes Program Recommendations:     Add Lantus 10 units QHS Increase meal coverage to Novolog 5 units tidwc.  Will continue to follow.   Thank you. Lorenda Peck, RD, LDN, CDE Inpatient Diabetes Coordinator [phone]

## 2018-01-16 NOTE — Progress Notes (Signed)
[facility]  PROGRESS NOTE  Robert Compton GEX:[id number] DOB: May 06, 1945 DOA: 01/10/2018 PCP: [name], MD   Brief HPI:   72 year old Caucasian male with past medical history significant for metastatic small cell lung carcinoma with mets to the liver and cervical spine history of chemotherapy-induced pancytopenia, diabetes mellitus, GERD, Chronic CHF and other comorbidities who presented with a complaint of abdominal pain.  Underwent therapeutic paracentesis and drain 5.2 L off.  We will continue antibiotics for now as well as continue with IV diuresis.  Discussed with oncology about giving Granix for the last and they did not advise at this time and Dr. Burr Medico agrees with my recommendation to hold these while he is in the hospital.  Still feel like he needs increased diuresis so IV Lasix increased to 80 twice daily today.    Subjective   Patient seen and examined, denies pain.  Diuresing well with IV Lasix.   Assessment/Plan:     1. Anasarca-multifactorial from hypoalbuminemia, chronic liver disease, diastolic CHF.  Patient status post paracentesis with 5.2 L drained.  Continue Lasix 80 mg twice daily.  Net - 14.4 L.  Will closely monitor renal function.  2. Hypomagnesemia-magnesium is still 1.5 despite giving 2 g of magnesium sulfate yesterday.  Will again replace magnesium and follow labs in a.m.  3. Hypokalemia-potassium is 3.2, will replace potassium and check BMP in a.m.  4. Small cell carcinoma of lung with liver metastasis-patient is on chemotherapy every 3 weeks, followed by [name] as outpatient.  Status post paracentesis, started on ciprofloxacin per pharmacy for?  SBP.  Blood cultures x2 showed no growth.  Continue PRN oxycodone  5. Chemotherapy-induced pancytopenia-patient's WBC, platelet count reducing every day.  Discussed with [name] who does not recommend  Granix at this time.  Follow CBC in a.m.  No signs and symptoms of  infection.  6. Hypertension-blood pressure stable, continue Coreg, PRN hydralazine  7. Diabetes mellitus type 2-continue sliding scale insulin with NovoLog, blood glucose is mildly elevated .  Continue sliding scale insulin with NovoLog, 3 units NovoLog meal coverage.  Might add Lantus if blood glucose continues to be elevated.     CBG: Recent Labs  Lab 01/15/18 1153 01/15/18 1732 01/15/18 2027 01/16/18 0729 01/16/18 1149  GLUCAP 374* 182* 174* 219* 252*    CBC: Recent Labs  Lab 01/10/18 2256  01/12/18 1001 01/13/18 0502 01/14/18 0353 01/15/18 0513 01/16/18 0602  WBC 4.9   < > 2.7* 3.4* 2.7* 2.0* 1.5*  NEUTROABS 3.7  --  2.2 2.7 1.9 1.3*  --   HGB 9.3*   < > 8.1* 8.5* 7.7* 7.5* 7.3*  HCT 28.1*   < > 24.1* 24.8* 22.6* 21.9* 21.1*  MCV 102.9*   < > 101.7* 100.8* 99.6 99.1 99.5  PLT 122*   < > 84* 75* 56* 40* 25*   < > = values in this interval not displayed.    Basic Metabolic Panel: Recent Labs  Lab 01/12/18 1001 01/13/18 0502 01/14/18 0353 01/15/18 0513 01/16/18 0602  NA 140 134* 134* 135 134*  K 3.8 3.6 3.4* 3.7 3.2*  CL 98 93* 91* 92* 91*  CO2 30 32 32 33* 33*  GLUCOSE 288* 187* 192* 206* 239*  BUN 24* 23 26* 29* 26*  CREATININE 0.84 0.90 0.87 0.98 0.97  CALCIUM 8.6* 8.4* 8.4* 8.5* 8.3*  MG 1.3* 1.6* 1.7 1.6* 1.5*  PHOS 4.0 3.7 3.6 3.4  --      DVT prophylaxis: SCDs  Code  Status: Full code  Family Communication: No family at bedside  Disposition Plan: likely home when medically ready for discharge   Consultants:  IR  Procedures:  None   Antibiotics:   Anti-infectives (From admission, onward)   Start     Dose/Rate Route Frequency Ordered Stop   01/11/18 0130  ciprofloxacin (CIPRO) IVPB 400 mg     400 mg 200 mL/hr over 60 Minutes Intravenous 2 times daily 01/11/18 0119 01/15/18 2231       Objective   Vitals:   01/15/18 1451 01/15/18 2024 01/16/18 0500 01/16/18 1410  BP: 102/60 111/63 114/62 (!) 116/59  Pulse: 83 76 74 84   Resp: 16 17 16 18   Temp: 98.2 F (36.8 C) 98 F (36.7 C) 98.4 F (36.9 C) 98.1 F (36.7 C)  TempSrc: Oral Oral Oral Oral  SpO2: 100% 100% 100% 98%  Weight:      Height:        Intake/Output Summary (Last 24 hours) at 01/16/2018 1556 Last data filed at 01/16/2018 1200 Gross per 24 hour  Intake 120.17 ml  Output 2900 ml  Net -2779.83 ml   Filed Weights   01/11/18 0236  Weight: 134.7 kg     Physical Examination:   Mouth: Oral mucosa is moist, no lesions on palate,  Neck: Supple, no deformities, masses, or tenderness Lungs: Normal respiratory effort, bilateral clear to auscultation, no crackles or wheezes.  Heart: Regular rate and rhythm, S1 and S2 normal, no murmurs, rubs auscultated Abdomen: BS normoactive,soft,nondistended,non-tender to palpation,no organomegaly Extremities: Trace pretibial edema of the lower extremities Neuro : Alert and oriented to time, place and person, No focal deficits     Data Reviewed: I have personally reviewed following labs and imaging studies   Recent Results (from the past 240 hour(s))  Urine culture     Status: None   Collection Time: 01/10/18  9:40 PM  Result Value Ref Range Status   Specimen Description   Final    URINE, RANDOM Performed at [facility] [address], [place], [place] [address]    Special Requests   Final    NONE Performed at [facility], [place] [address] [place], [place] [address]    Culture   Final    NO GROWTH Performed at [facility], [place] [address] [place], [place] [address]    Report Status 01/12/2018 FINAL  Final  Culture, blood (x 2)     Status: None   Collection Time: 01/11/18  4:47 AM  Result Value Ref Range Status   Specimen Description   Final    BLOOD RIGHT HAND Performed at [facility] [address] [place], [place] [address]    Special Requests   Final    BOTTLES DRAWN AEROBIC ONLY Blood Culture adequate  volume Performed at [facility] [address], [place], [place] [address]    Culture   Final    NO GROWTH 5 DAYS Performed at [facility], [place] [address] [place], [place] [address]    Report Status 01/16/2018 FINAL  Final  Culture, blood (x 2)     Status: None   Collection Time: 01/11/18  4:47 AM  Result Value Ref Range Status   Specimen Description   Final    BLOOD RIGHT HAND Performed at [facility] [address], [place], [place] [address]    Special Requests   Final    BOTTLES DRAWN AEROBIC  ONLY Blood Culture adequate volume Performed at [facility] [address], [place], [place] [address]    Culture   Final    NO GROWTH 5 DAYS Performed at [facility], [place] [address], [place], [place] [address]    Report Status 01/16/2018 FINAL  Final  Gram stain     Status: None   Collection Time: 01/11/18 11:27 AM  Result Value Ref Range Status   Specimen Description FLUID LEFT ABDOMEN  Final   Special Requests   Final    NONE Performed at [facility] [address], [place], [place] [address]    Gram Stain   Final    RARE WBC PRESENT, PREDOMINANTLY MONONUCLEAR NO ORGANISMS SEEN Performed at [facility], [place] [address] [place], [place] [address]    Report Status 01/12/2018 FINAL  Final  Culture, body fluid-bottle     Status: None   Collection Time: 01/11/18 11:27 AM  Result Value Ref Range Status   Specimen Description FLUID LEFT ABDOMEN  Final   Special Requests BOTTLES DRAWN AEROBIC AND ANAEROBIC  Final   Culture   Final    NO GROWTH 5 DAYS Performed at [facility], [place] [address] [place], [place] [address]    Report Status 01/16/2018 FINAL  Final     Liver Function Tests: Recent Labs  Lab 01/12/18 1001 01/13/18 0502 01/14/18 0353 01/15/18 0513 01/16/18 0602  AST 45* 45* 40 36 38  ALT 32 37 34 35 35  ALKPHOS 89 88 86 95 97  BILITOT 1.8* 1.5* 1.5*  1.3* 1.0  PROT 6.2* 6.1* 6.1* 6.2* 5.9*  ALBUMIN 2.9* 2.8* 2.7* 2.8* 2.7*   Recent Labs  Lab 01/10/18 2256  LIPASE 49   Recent Labs  Lab 01/10/18 2256  AMMONIA 54*    Cardiac Enzymes: No results for input(s): CKTOTAL, CKMB, CKMBINDEX, TROPONINI in the last 168 hours. BNP (last 3 results) Recent Labs    11/26/17 1640 12/13/17 2211 01/10/18 2256  BNP 116.9* 63.1 90.1    ProBNP (last 3 results) No results for input(s): PROBNP in the last 8760 hours.    Studies: No results found.  Scheduled Meds: . carvedilol  3.125 mg Oral BID WC  . feeding supplement (PRO-STAT SUGAR FREE 64)  30 mL Oral BID  . furosemide  80 mg Intravenous BID  . insulin aspart  0-5 Units Subcutaneous QHS  . insulin aspart  0-9 Units Subcutaneous TID WC  . insulin aspart  3 Units Subcutaneous TID WC  . insulin glargine  10 Units Subcutaneous QHS  . loratadine  10 mg Oral Daily  . nicotine  14 mg Transdermal Daily  . omega-3 acid ethyl esters  1 g Oral BID  . pantoprazole  40 mg Oral Daily  . vitamin B-12  100 mcg Oral Daily      Time spent: 25 min  [facility] Pager [phone]. If 7PM-7AM, please contact night-coverage at [email or web address], Office  [phone]  password [email or web address]  01/16/2018, 3:56 PM  LOS: 4 days

## 2018-01-16 NOTE — Progress Notes (Signed)
CRITICAL VALUE ALERT  Critical Value:  Plt - 25  Date & Time Notied:  01/16/18 @ 4591  Provider Notified: Dr. Darrick Meigs  Orders Received/Actions taken: None. No callback received yet

## 2018-01-17 LAB — GLUCOSE, CAPILLARY
GLUCOSE-CAPILLARY: 258 mg/dL — AB (ref 70–99)
GLUCOSE-CAPILLARY: 180 mg/dL — AB (ref 70–99)
GLUCOSE-CAPILLARY: 247 mg/dL — AB (ref 70–99)
Glucose-Capillary: 152 mg/dL — ABNORMAL HIGH (ref 70–99)

## 2018-01-17 LAB — CBC
HCT: 23 % — ABNORMAL LOW (ref 39.0–52.0)
Hemoglobin: 7.9 g/dL — ABNORMAL LOW (ref 13.0–17.0)
MCH: 33.8 pg (ref 26.0–34.0)
MCHC: 34.3 g/dL (ref 30.0–36.0)
MCV: 98.3 fL (ref 78.0–100.0)
PLATELETS: 21 10*3/uL — AB (ref 150–400)
RBC: 2.34 MIL/uL — AB (ref 4.22–5.81)
RDW: 16.3 % — ABNORMAL HIGH (ref 11.5–15.5)
WBC: 1.9 10*3/uL — ABNORMAL LOW (ref 4.0–10.5)

## 2018-01-17 LAB — BASIC METABOLIC PANEL
ANION GAP: 10 (ref 5–15)
BUN: 24 mg/dL — AB (ref 8–23)
CO2: 32 mmol/L (ref 22–32)
Calcium: 8.6 mg/dL — ABNORMAL LOW (ref 8.9–10.3)
Chloride: 92 mmol/L — ABNORMAL LOW (ref 98–111)
Creatinine, Ser: 0.96 mg/dL (ref 0.61–1.24)
GFR calc Af Amer: >60 mL/min (ref >60)
GLUCOSE: 203 mg/dL — AB (ref 70–99)
POTASSIUM: 3.4 mmol/L — AB (ref 3.5–5.1)
Sodium: 134 mmol/L — ABNORMAL LOW (ref 135–145)

## 2018-01-17 MED ORDER — FUROSEMIDE 40 MG PO TABS
60.0000 mg | ORAL_TABLET | Freq: Two times a day (BID) | ORAL | Status: DC
  Administered 2018-01-17 – 2018-01-18 (×2): 60 mg via ORAL
  Filled 2018-01-17 (×2): qty 1

## 2018-01-17 MED ORDER — POTASSIUM CHLORIDE CRYS ER 20 MEQ PO TBCR
40.0000 meq | EXTENDED_RELEASE_TABLET | Freq: Once | ORAL | Status: AC
  Administered 2018-01-17: 40 meq via ORAL
  Filled 2018-01-17: qty 2

## 2018-01-17 NOTE — Progress Notes (Signed)
Nutrition Follow-up  DOCUMENTATION CODES:   Obesity unspecified  INTERVENTION:   Provide Prostat liquid protein PO 30 ml BID with meals, each supplement provides 100 kcal, 15 grams protein.  NUTRITION DIAGNOSIS:   Increased nutrient needs related to cancer and cancer related treatments as evidenced by estimated needs.  GOAL:   Patient will meet greater than or equal to 90% of their needs  MONITOR:   PO intake, Supplement acceptance, Labs, Weight trends, I & O's  REASON FOR ASSESSMENT:   Other (Comment)(Follow up) Assessment of nutrition requirement/status  ASSESSMENT:   72 y.o. 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 Admitted for anasarca and abdominal pain.  Patient eating 93% of past 7 recorded meals. Pt with weight gain from fluid accumulation. Pt reports UBW of 270 lbs and stated his current chemo regiment causes significant fluid accumulation. S/p paracentesis 9/28 which yielded 5.3L. Pt reports getting to go home tomorrow, but feels confident he will be returning after receiving his next tx on 10/7.   Pt currently on low Na diet and requested education on how to continue diet once home in hopes to prevent severity of fluid accumulation.   Patient was diagnosed with moderate malnutrition during previous admission 9/5.   Medications: Lasix PO/BID, novoLog (0-9) w/ meals, B-12, omega-3, protonix Labs reviewed: CBGs: 252-258 Na 134 (L), K 3.4 (L), Glucose 203 (H), BUN 24 (H)    EDUCATION NEEDS:   No education needs have been identified at this time  Skin:  Skin Assessment: Reviewed RN Assessment(cellulitis BLE;  blister, left foot)  Last BM:  9/30; Type 6, brown-yellow; large  Height:   Ht Readings from Last 1 Encounters:  01/11/18 6\' 3"  (1.905 m)    Weight:   Wt Readings from Last 1 Encounters:  01/17/18 134.7 kg    Ideal  Body Weight:  89.1 kg  BMI:  Body mass index is 37.12 kg/m.  Estimated Nutritional Needs:   Kcal:  5825-1898  Protein:  130-140g  Fluid:  1.2L per MD   Lajuan Lines, RD, LDN  After Hours/Weekend Pager: [phone]

## 2018-01-17 NOTE — Care Management (Signed)
This CM spoke with pt at bedside about DC planning. Pt states that his daughter has already set up home health services for him but he is unsure of the company. This CM called daughter and left a message to inquire about [facility]. Will await return call. Will need orders for HHPT at dc. Marney Doctor RN,BSN [phone]

## 2018-01-17 NOTE — Progress Notes (Signed)
OT Cancellation Note  Patient Details Name: Pilar Corrales MRN: [id number] DOB: Aug 19, 1945   Cancelled Treatment:    Reason Eval/Treat Not Completed: Fatigue/lethargy limiting ability to participate  [place] 01/17/2018, 12:57 PM  Lesle Chris, OTR/L Acute Rehabilitation Services [phone] [name] pager [phone] office 01/17/2018

## 2018-01-17 NOTE — Progress Notes (Signed)
PT Cancellation Note  Patient Details Name: Robert Compton MRN: [id number] DOB: 09-27-1945   Cancelled Treatment:    Reason Eval/Treat Not Completed: Pt c/o not feeling well this morning. Will check back if shcedule allows.    Weston Anna, PT Acute Rehabilitation Services Pager: [phone] Office: [phone]

## 2018-01-17 NOTE — Progress Notes (Signed)
PT Cancellation Note  Patient Details Name: Robert Compton MRN: [id number] DOB: [birth year]   Cancelled Treatment:    Reason Eval/Treat Not Completed: 2nd attempt-pt declined participation.    Weston Anna, PT Acute Rehabilitation Services Pager: [phone] Office: [phone]

## 2018-01-17 NOTE — Progress Notes (Signed)
[facility]  PROGRESS NOTE  Robert Compton EUM:[id number] DOB: 1946-03-16 DOA: 01/10/2018 PCP: [name], MD   Brief HPI:   72 year old Caucasian male with past medical history significant for metastatic small cell lung carcinoma with mets to the liver and cervical spine history of chemotherapy-induced pancytopenia, diabetes mellitus, GERD, Chronic CHF and other comorbidities who presented with a complaint of abdominal pain.  Underwent therapeutic paracentesis and drain 5.2 L off.  We will continue antibiotics for now as well as continue with IV diuresis.  Discussed with oncology about giving Granix for the last and they did not advise at this time and Dr. Burr Medico agrees with my recommendation to hold these while he is in the hospital.  Still feel like he needs increased diuresis so IV Lasix increased to 80 twice daily today.    Subjective   Patient seen and examined, lethargic this morning.   Assessment/Plan:     1. Anasarca-multifactorial from hypoalbuminemia, chronic liver disease, diastolic CHF.  Patient status post paracentesis with 5.2 L drained.  Continue Lasix 80 mg twice daily.  Net -16 L.  Will closely monitor renal function.  We will switch from IV Lasix to p.o. 60 mg twice daily  2. Hypomagnesemia-replete, will recheck magnesium level in a.m.  3. Hypokalemia-potassium is 3. 4 today, will replace potassium recheck BMP in a.m.  4. Small cell carcinoma of lung with liver metastasis-patient is on chemotherapy every 3 weeks, followed by [name] as outpatient.  Status post paracentesis, started on ciprofloxacin per pharmacy for?  SBP.  Blood cultures x2 showed no growth.  Continue PRN oxycodone  5. Chemotherapy-induced pancytopenia-patient's WBC, platelet count  are slowly improving.  Discussed with [name] who does not recommend  Granix at this time.  Follow CBC in a.m.  No signs and symptoms of infection.  6. Hypertension-blood pressure stable, continue Coreg,  PRN hydralazine  7. Diabetes mellitus type 2-continue sliding scale insulin with NovoLog, blood glucose is mildly elevated  Continue sliding scale insulin with NovoLog, 3 units NovoLog meal coverage.  Lantus 10 units subcu daily added     CBG: Recent Labs  Lab 01/16/18 1149 01/16/18 1624 01/16/18 2025 01/17/18 0739 01/17/18 1206  GLUCAP 252* 205* 216* 152* 258*    CBC: Recent Labs  Lab 01/10/18 2256  01/12/18 1001 01/13/18 0502 01/14/18 0353 01/15/18 0513 01/16/18 0602 01/17/18 0356  WBC 4.9   < > 2.7* 3.4* 2.7* 2.0* 1.5* 1.9*  NEUTROABS 3.7  --  2.2 2.7 1.9 1.3*  --   --   HGB 9.3*   < > 8.1* 8.5* 7.7* 7.5* 7.3* 7.9*  HCT 28.1*   < > 24.1* 24.8* 22.6* 21.9* 21.1* 23.0*  MCV 102.9*   < > 101.7* 100.8* 99.6 99.1 99.5 98.3  PLT 122*   < > 84* 75* 56* 40* 25* 21*   < > = values in this interval not displayed.    Basic Metabolic Panel: Recent Labs  Lab 01/12/18 1001 01/13/18 0502 01/14/18 0353 01/15/18 0513 01/16/18 0602 01/17/18 0356  NA 140 134* 134* 135 134* 134*  K 3.8 3.6 3.4* 3.7 3.2* 3.4*  CL 98 93* 91* 92* 91* 92*  CO2 30 32 32 33* 33* 32  GLUCOSE 288* 187* 192* 206* 239* 203*  BUN 24* 23 26* 29* 26* 24*  CREATININE 0.84 0.90 0.87 0.98 0.97 0.96  CALCIUM 8.6* 8.4* 8.4* 8.5* 8.3* 8.6*  MG 1.3* 1.6* 1.7 1.6* 1.5*  --   PHOS 4.0 3.7 3.6 3.4  --   --  DVT prophylaxis: SCDs  Code Status: Full code  Family Communication: No family at bedside  Disposition Plan: likely home when medically ready for discharge   Consultants:  IR  Procedures:  None   Antibiotics:   Anti-infectives (From admission, onward)   Start     Dose/Rate Route Frequency Ordered Stop   01/11/18 0130  ciprofloxacin (CIPRO) IVPB 400 mg     400 mg 200 mL/hr over 60 Minutes Intravenous 2 times daily 01/11/18 0119 01/15/18 2231       Objective   Vitals:   01/16/18 1900 01/16/18 2028 01/17/18 0509 01/17/18 1452  BP:  107/62 108/65 108/62  Pulse:  79 82 82   Resp:  17 16 16   Temp:   98 F (36.7 C) 98.6 F (37 C)  TempSrc:   Oral Oral  SpO2:  100% 98% 96%  Weight: 134.7 kg  134.7 kg   Height:        Intake/Output Summary (Last 24 hours) at 01/17/2018 1544 Last data filed at 01/17/2018 1452 Gross per 24 hour  Intake 480 ml  Output 2150 ml  Net -1670 ml   Filed Weights   01/11/18 0236 01/16/18 1900 01/17/18 0509  Weight: 134.7 kg 134.7 kg 134.7 kg     Physical Examination:   Mouth: Oral mucosa is moist, no lesions on palate,  Neck: Supple, no deformities, masses, or tenderness Lungs: Normal respiratory effort, bilateral clear to auscultation, no crackles or wheezes.  Heart: Regular rate and rhythm, S1 and S2 normal, no murmurs, rubs auscultated Abdomen: BS normoactive,soft,nondistended,non-tender to palpation,no organomegaly Extremities: Trace pretibial edema of the lower extremities Neuro : Alert and oriented to time, place and person, No focal deficits     Data Reviewed: I have personally reviewed following labs and imaging studies   Recent Results (from the past 240 hour(s))  Urine culture     Status: None   Collection Time: 01/10/18  9:40 PM  Result Value Ref Range Status   Specimen Description   Final    URINE, RANDOM Performed at [facility] [address], [place], [place] [address]    Special Requests   Final    NONE Performed at [facility], [place] [address], [place], [place] [address]    Culture   Final    NO GROWTH Performed at [facility], [place] [address], [place], [place] [address]    Report Status 01/12/2018 FINAL  Final  Culture, blood (x 2)     Status: None   Collection Time: 01/11/18  4:47 AM  Result Value Ref Range Status   Specimen Description   Final    BLOOD RIGHT HAND Performed at [facility] [address], [place], [place] [address]    Special Requests   Final    BOTTLES DRAWN AEROBIC ONLY Blood Culture adequate  volume Performed at [facility] [address] [place], [place] [address]    Culture   Final    NO GROWTH 5 DAYS Performed at [facility], [place] [address], [place], [place] [address]    Report Status 01/16/2018 FINAL  Final  Culture, blood (x 2)     Status: None   Collection Time: 01/11/18  4:47 AM  Result Value Ref Range Status   Specimen Description   Final    BLOOD RIGHT HAND Performed at [facility] [address], [place],  [address]    Special Requests  Final    BOTTLES DRAWN AEROBIC ONLY Blood Culture adequate volume Performed at [facility] [address] [place],  [place] [address]    Culture   Final    NO GROWTH 5 DAYS Performed at [facility], [place] [address] [place], [place] [address]    Report Status 01/16/2018 FINAL  Final  Gram stain     Status: None   Collection Time: 01/11/18 11:27 AM  Result Value Ref Range Status   Specimen Description FLUID LEFT ABDOMEN  Final   Special Requests   Final    NONE Performed at [facility] [address] [place], [place] [address]    Gram Stain   Final    RARE WBC PRESENT, PREDOMINANTLY MONONUCLEAR NO ORGANISMS SEEN Performed at [facility], [place] [address], [place],  [address]    Report Status 01/12/2018 FINAL  Final  Culture, body fluid-bottle     Status: None   Collection Time: 01/11/18 11:27 AM  Result Value Ref Range Status   Specimen Description FLUID LEFT ABDOMEN  Final   Special Requests BOTTLES DRAWN AEROBIC AND ANAEROBIC  Final   Culture   Final    NO GROWTH 5 DAYS Performed at [facility], [place] [address], [place],  [address]    Report Status 01/16/2018 FINAL  Final     Liver Function Tests: Recent Labs  Lab 01/12/18 1001 01/13/18 0502 01/14/18 0353 01/15/18 0513 01/16/18 0602  AST 45* 45* 40 36 38  ALT 32 37 34 35 35  ALKPHOS 89 88 86 95 97  BILITOT 1.8* 1.5* 1.5*  1.3* 1.0  PROT 6.2* 6.1* 6.1* 6.2* 5.9*  ALBUMIN 2.9* 2.8* 2.7* 2.8* 2.7*   Recent Labs  Lab 01/10/18 2256  LIPASE 49   Recent Labs  Lab 01/10/18 2256  AMMONIA 54*    Cardiac Enzymes: No results for input(s): CKTOTAL, CKMB, CKMBINDEX, TROPONINI in the last 168 hours. BNP (last 3 results) Recent Labs    11/26/17 1640 12/13/17 2211 01/10/18 2256  BNP 116.9* 63.1 90.1    ProBNP (last 3 results) No results for input(s): PROBNP in the last 8760 hours.    Studies: No results found.  Scheduled Meds: . carvedilol  3.125 mg Oral BID WC  . feeding supplement (PRO-STAT SUGAR FREE 64)  30 mL Oral BID  . furosemide  60 mg Oral BID  . insulin aspart  0-5 Units Subcutaneous QHS  . insulin aspart  0-9 Units Subcutaneous TID WC  . insulin aspart  3 Units Subcutaneous TID WC  . insulin glargine  10 Units Subcutaneous QHS  . loratadine  10 mg Oral Daily  . nicotine  14 mg Transdermal Daily  . omega-3 acid ethyl esters  1 g Oral BID  . pantoprazole  40 mg Oral Daily  . vitamin B-12  100 mcg Oral Daily      Time spent: 25 min  [facility] Pager [phone]. If 7PM-7AM, please contact night-coverage at [email or web address], Office  [phone]  password [email or web address]  01/17/2018, 3:44 PM  LOS: 5 days

## 2018-01-18 LAB — BASIC METABOLIC PANEL
Anion gap: 10 (ref 5–15)
BUN: 23 mg/dL (ref 8–23)
CHLORIDE: 95 mmol/L — AB (ref 98–111)
CO2: 30 mmol/L (ref 22–32)
Calcium: 8.2 mg/dL — ABNORMAL LOW (ref 8.9–10.3)
Creatinine, Ser: 0.95 mg/dL (ref 0.61–1.24)
GFR calc non Af Amer: >60 mL/min (ref >60)
Glucose, Bld: 250 mg/dL — ABNORMAL HIGH (ref 70–99)
POTASSIUM: 3.6 mmol/L (ref 3.5–5.1)
SODIUM: 135 mmol/L (ref 135–145)

## 2018-01-18 LAB — CBC
HEMATOCRIT: 21.5 % — AB (ref 39.0–52.0)
Hemoglobin: 7.4 g/dL — ABNORMAL LOW (ref 13.0–17.0)
MCH: 34.1 pg — ABNORMAL HIGH (ref 26.0–34.0)
MCHC: 34.4 g/dL (ref 30.0–36.0)
MCV: 99.1 fL (ref 78.0–100.0)
Platelets: 15 10*3/uL — CL (ref 150–400)
RBC: 2.17 MIL/uL — AB (ref 4.22–5.81)
RDW: 16.5 % — ABNORMAL HIGH (ref 11.5–15.5)
WBC: 1.5 10*3/uL — AB (ref 4.0–10.5)

## 2018-01-18 LAB — GLUCOSE, CAPILLARY: Glucose-Capillary: 165 mg/dL — ABNORMAL HIGH (ref 70–99)

## 2018-01-18 MED ORDER — SENNOSIDES-DOCUSATE SODIUM 8.6-50 MG PO TABS: 1.0000 | ORAL_TABLET | Freq: Every evening | ORAL | 1 refills | Status: DC | PRN

## 2018-01-18 MED ORDER — FUROSEMIDE 20 MG PO TABS: 60.0000 mg | ORAL_TABLET | Freq: Two times a day (BID) | ORAL | 2 refills | Status: DC

## 2018-01-18 MED ORDER — INSULIN GLARGINE 100 UNIT/ML ~~LOC~~ SOLN: 10.0000 [IU] | Freq: Every day | SUBCUTANEOUS | 11 refills | Status: DC

## 2018-01-18 NOTE — Discharge Summary (Signed)
Physician Discharge Summary  Robert Compton JHE:[id number] DOB: March 15, 1946 DOA: 01/10/2018  PCP: [name], MD  Admit date: 01/10/2018 Discharge date: 01/18/2018  Time spent: 40* minutes  Recommendations for Outpatient Follow-up:  1. Follow up Dr Julien Nordmann on Monday, 01/20/2018 2. Home health PT will be set up before discharge   Discharge Diagnoses:  Principal Problem:   Anasarca Active Problems:   Diabetes mellitus without complication ([place])   Essential hypertension   Extensive stage primary small cell carcinoma of lung (HCC)   Antineoplastic chemotherapy induced pancytopenia (HCC)   Liver metastasis (HCC)   Anemia   Severe protein-calorie malnutrition (HCC)   Chronic diastolic CHF (congestive heart failure) (HCC)   Abdominal pain   Elevated lactic acid level   Discharge Condition: Stable  Diet recommendation: Carb modified diet  Filed Weights   01/16/18 1900 01/17/18 0509 01/18/18 0428  Weight: 134.7 kg 134.7 kg 126.6 kg    History of present illness:  73 year old Caucasian male with past medical history significant for metastatic small cell lung carcinoma with mets to the liver and cervical spine history of chemotherapy-induced pancytopenia, diabetes mellitus, GERD, Chronic CHF and other comorbidities who presented with a complaint of abdominal pain.  Underwent therapeutic paracentesis and drain 5.2 L off. We will continue antibiotics for now as well as continue with IV diuresis. Discussed with oncology about giving Granix for the last and they did not advise at this time and Dr. Burr Medico agrees with my recommendation to hold these while he is in the hospital. Still feel like he needs increased diuresis so IV Lasix increased to 80 twice daily today.  Hospital Course:   1. Anasarca- significantly improved, Net -17 L,multifactorial from hypoalbuminemia, chronic liver disease, diastolic CHF.  Patient status post paracentesis with 5.2 L drained.    Patient was started on  IV Lasix 80 mg twice daily, which has been changed to Lasix 60 mg p.o. twice daily.   2. Hypomagnesemia-replete.  3. Hypokalemia-replete  4. Small cell carcinoma of lung with liver metastasis-patient is on chemotherapy every 3 weeks, followed by [name] as outpatient.  Status post paracentesis, started on ciprofloxacin per pharmacy for?  SBP.  Blood cultures x2 showed no growth.  Continue PRN oxycodone  5. Chemotherapy-induced pancytopenia-patient's WBC, platelet count  are slowly improving.  Discussed with [name] who does not recommend  Granix at this time.    Today WBC is 1.5, platelets 15,000.  I called and discussed with oncologist on-call Dr. Annamaria Boots, who does not recommend Granix.  Patient can follow-up with [name] on Monday  6. Hypertension-blood pressure stable, continue Coreg, PRN hydralazine  7. Diabetes mellitus type 2-we will discharge on metformin, Lantus 10 units subcu daily.  Procedures:  None  Consultations:  None  Discharge Exam: Vitals:   01/17/18 2119 01/18/18 0428  BP: 109/72 122/80  Pulse: 74 70  Resp: 18 16  Temp: 98.6 F (37 C) 98.4 F (36.9 C)  SpO2: 97% 98%    General: Appears in no acute distress Cardiovascular: S1-S2, regular Respiratory: Clear to auscultation bilaterally  Discharge Instructions   Discharge Instructions    Diet - low sodium heart healthy   Complete by:  As directed    Increase activity slowly   Complete by:  As directed      Allergies as of 01/18/2018      Reactions   Amoxicillin-pot Clavulanate Anaphylaxis   Has patient had a PCN reaction causing immediate rash, facial/tongue/throat swelling, SOB or lightheadedness with hypotension: Yes  Has patient had a PCN reaction causing severe rash involving mucus membranes or skin necrosis: Yes Has patient had a PCN reaction that required hospitalization: Yes Has patient had a PCN reaction occurring within the last 10 years: Yes If all of the above answers are  "NO", then may proceed with Cephalosporin use.   Nitrofurantoin Anaphylaxis      Medication List    TAKE these medications   carvedilol 3.125 MG tablet Commonly known as:  COREG Take 1 tablet (3.125 mg total) by mouth 2 (two) times daily with a meal.   CINNAMON PO Take 600 mg by mouth 2 (two) times daily.   fish oil-omega-3 fatty acids 1000 MG capsule Take 1 g by mouth 2 (two) times daily.   fluticasone 50 MCG/ACT nasal spray Commonly known as:  FLONASE Place 2 sprays into both nostrils daily as needed for allergies.   furosemide 20 MG tablet Commonly known as:  LASIX Take 3 tablets (60 mg total) by mouth 2 (two) times daily. What changed:    how much to take  Another medication with the same name was removed. Continue taking this medication, and follow the directions you see here.   insulin aspart 100 UNIT/ML injection Commonly known as:  novoLOG CBG < 70: implement hypoglycemia protocol CBG 70 - 120: 0 units CBG 121 - 150: 2 units CBG 151 - 200: 3 units CBG 201 - 250: 5 units CBG 251 - 300: 8 units CBG 301 - 350: 11 units CBG 351 - 400: 15 units CBG > 400: call MD   insulin glargine 100 UNIT/ML injection Commonly known as:  LANTUS Inject 0.1 mLs (10 Units total) into the skin at bedtime.   loratadine 10 MG tablet Commonly known as:  CLARITIN Take 1 tablet (10 mg total) by mouth daily.   metFORMIN 1000 MG tablet Commonly known as:  GLUCOPHAGE Take 1,000 mg by mouth 2 (two) times daily with a meal.   nicotine 21 mg/24hr patch Commonly known as:  NICODERM CQ - dosed in mg/24 hours Place 1 patch (21 mg total) onto the skin daily. What changed:  Another medication with the same name was removed. Continue taking this medication, and follow the directions you see here.   omeprazole 20 MG capsule Commonly known as:  PRILOSEC Take 20 mg by mouth daily.   polyethylene glycol packet Commonly known as:  MIRALAX / GLYCOLAX Take 17 g by mouth daily. What changed:     when to take this  reasons to take this   Potassium Chloride ER 20 MEQ Tbcr Take 20 mEq by mouth 2 (two) times daily.   Red Yeast Rice 600 MG Tabs Take 1 tablet by mouth 2 (two) times daily.   senna-docusate 8.6-50 MG tablet Commonly known as:  Senokot-S Take 1 tablet by mouth at bedtime as needed for mild constipation.   traMADol 50 MG tablet Commonly known as:  ULTRAM Take 1 tablet (50 mg total) by mouth every 6 (six) hours as needed for moderate pain.   VITAMIN B-12 PO Take 1 tablet by mouth daily.      Allergies  Allergen Reactions  . Amoxicillin-Pot Clavulanate Anaphylaxis    Has patient had a PCN reaction causing immediate rash, facial/tongue/throat swelling, SOB or lightheadedness with hypotension: Yes Has patient had a PCN reaction causing severe rash involving mucus membranes or skin necrosis: Yes Has patient had a PCN reaction that required hospitalization: Yes Has patient had a PCN reaction occurring within the last 10  years: Yes If all of the above answers are "NO", then may proceed with Cephalosporin use.   . Nitrofurantoin Anaphylaxis      The results of significant diagnostics from this hospitalization (including imaging, microbiology, ancillary and laboratory) are listed below for reference.    Significant Diagnostic Studies: Dg Chest 2 View  Result Date: 01/10/2018 CLINICAL DATA:  Fluid overload. EXAM: CHEST - 2 VIEW COMPARISON:  Radiograph of December 13, 2017. FINDINGS: The heart size and mediastinal contours are within normal limits. Both lungs are clear. No pneumothorax or pleural effusion is noted. The visualized skeletal structures are unremarkable. IMPRESSION: No active cardiopulmonary disease. Electronically Signed   By: Marijo Conception, M.D.   On: 01/10/2018 21:46   US Abdomen Limited  Result Date: 12/23/2017 CLINICAL DATA:  72 year old male with abdominal distension, possible ascites EXAM: LIMITED ABDOMEN ULTRASOUND FOR ASCITES TECHNIQUE:  Limited ultrasound survey for ascites was performed in all four abdominal quadrants. COMPARISON:  Prior abdominal ultrasound 12/14/2017 FINDINGS: Sonographic interrogation of the 4 quadrants of the abdomen demonstrates only a small volume ascites in the left hemiabdomen. The fluid is deemed insufficient for paracentesis. IMPRESSION: Small volume ascites, insufficient for paracentesis. Electronically Signed   By: Jacqulynn Cadet M.D.   On: 12/23/2017 15:18   US Paracentesis  Result Date: 01/11/2018 INDICATION: Patient with malignant ascites. Request is made for diagnostic and therapeutic paracentesis. EXAM: ULTRASOUND GUIDED DIAGNOSTIC AND THERAPEUTIC PARACENTESIS MEDICATIONS: 10 mL 1% lidocaine COMPLICATIONS: None immediate. PROCEDURE: Informed written consent was obtained from the patient after a discussion of the risks, benefits and alternatives to treatment. A timeout was performed prior to the initiation of the procedure. Initial ultrasound scanning demonstrates a moderate amount of ascites within the left lateral. The left lateral abdomen was prepped and draped in the usual sterile fashion. 1% lidocaine was used for local anesthesia. Following this, a 19 gauge, 7-cm, Yueh catheter was introduced. An ultrasound image was saved for documentation purposes. The paracentesis was performed. The catheter was removed and a dressing was applied. The patient tolerated the procedure well without immediate post procedural complication. FINDINGS: A total of approximately 5.2 liters of yellow fluid was removed. Samples were sent to the laboratory as requested by the clinical team. IMPRESSION: Successful ultrasound-guided diagnostic and therapeutic paracentesis yielding 5.2 liters of peritoneal fluid. Read by: Brynda Greathouse PA-C Electronically Signed   By: Marybelle Killings M.D.   On: 01/11/2018 12:31    Microbiology: Recent Results (from the past 240 hour(s))  Urine culture     Status: None   Collection Time:  01/10/18  9:40 PM  Result Value Ref Range Status   Specimen Description   Final    URINE, RANDOM Performed at [facility] [address], [place], [place] [address]    Special Requests   Final    NONE Performed at [facility], [place] [address] [place], [place] [address]    Culture   Final    NO GROWTH Performed at [facility], [place] [address], [place], [place] [address]    Report Status 01/12/2018 FINAL  Final  Culture, blood (x 2)     Status: None   Collection Time: 01/11/18  4:47 AM  Result Value Ref Range Status   Specimen Description   Final    BLOOD RIGHT HAND Performed at [facility] [address] [place], [place] [address]    Special Requests   Final    BOTTLES DRAWN AEROBIC ONLY  Blood Culture adequate volume Performed at [facility] [address] [place], [place] [address]    Culture   Final    NO GROWTH 5 DAYS Performed at [facility], [place] [address], [place], [place] [address]    Report Status 01/16/2018 FINAL  Final  Culture, blood (x 2)     Status: None   Collection Time: 01/11/18  4:47 AM  Result Value Ref Range Status   Specimen Description   Final    BLOOD RIGHT HAND Performed at [facility] [address] [place], [place] [address]    Special Requests   Final    BOTTLES DRAWN AEROBIC ONLY Blood Culture adequate volume Performed at [facility] [address] [place], [place] [address]    Culture   Final    NO GROWTH 5 DAYS Performed at [facility], [place] [address] [place], [place] [address]    Report Status 01/16/2018 FINAL  Final  Gram stain     Status: None   Collection Time: 01/11/18 11:27 AM  Result Value Ref Range Status   Specimen Description FLUID LEFT ABDOMEN  Final   Special Requests   Final    NONE Performed at [facility] [address] [place], [place] [address]     Gram Stain   Final    RARE WBC PRESENT, PREDOMINANTLY MONONUCLEAR NO ORGANISMS SEEN Performed at [facility], [place] [address], [place], [place] [address]    Report Status 01/12/2018 FINAL  Final  Culture, body fluid-bottle     Status: None   Collection Time: 01/11/18 11:27 AM  Result Value Ref Range Status   Specimen Description FLUID LEFT ABDOMEN  Final   Special Requests BOTTLES DRAWN AEROBIC AND ANAEROBIC  Final   Culture   Final    NO GROWTH 5 DAYS Performed at [facility], [place] [address], [place], [place] [address]    Report Status 01/16/2018 FINAL  Final     Labs: Basic Metabolic Panel: Recent Labs  Lab 01/12/18 1001 01/13/18 0502 01/14/18 0353 01/15/18 0513 01/16/18 0602 01/17/18 0356 01/18/18 0342  NA 140 134* 134* 135 134* 134* 135  K 3.8 3.6 3.4* 3.7 3.2* 3.4* 3.6  CL 98 93* 91* 92* 91* 92* 95*  CO2 30 32 32 33* 33* 32 30  GLUCOSE 288* 187* 192* 206* 239* 203* 250*  BUN 24* 23 26* 29* 26* 24* 23  CREATININE 0.84 0.90 0.87 0.98 0.97 0.96 0.95  CALCIUM 8.6* 8.4* 8.4* 8.5* 8.3* 8.6* 8.2*  MG 1.3* 1.6* 1.7 1.6* 1.5*  --   --   PHOS 4.0 3.7 3.6 3.4  --   --   --    Liver Function Tests: Recent Labs  Lab 01/12/18 1001 01/13/18 0502 01/14/18 0353 01/15/18 0513 01/16/18 0602  AST 45* 45* 40 36 38  ALT 32 37 34 35 35  ALKPHOS 89 88 86 95 97  BILITOT 1.8* 1.5* 1.5* 1.3* 1.0  PROT 6.2* 6.1* 6.1* 6.2* 5.9*  ALBUMIN 2.9* 2.8* 2.7* 2.8* 2.7*   No results for input(s): LIPASE, AMYLASE in the last 168 hours. No results for input(s): AMMONIA in the last 168 hours. CBC: Recent Labs  Lab 01/12/18 1001 01/13/18 0502 01/14/18 0353 01/15/18 0513 01/16/18 0602 01/17/18 0356 01/18/18 0342  WBC 2.7* 3.4* 2.7* 2.0* 1.5* 1.9* 1.5*  NEUTROABS 2.2 2.7 1.9 1.3*  --   --   --  HGB 8.1* 8.5* 7.7* 7.5* 7.3* 7.9* 7.4*  HCT 24.1* 24.8* 22.6* 21.9* 21.1* 23.0* 21.5*  MCV 101.7* 100.8* 99.6 99.1 99.5 98.3 99.1  PLT 84* 75* 56* 40* 25* 21* 15*   Cardiac  Enzymes: No results for input(s): CKTOTAL, CKMB, CKMBINDEX, TROPONINI in the last 168 hours. BNP: BNP (last 3 results) Recent Labs    11/26/17 1640 12/13/17 2211 01/10/18 2256  BNP 116.9* 63.1 90.1    ProBNP (last 3 results) No results for input(s): PROBNP in the last 8760 hours.  CBG: Recent Labs  Lab 01/17/18 0739 01/17/18 1206 01/17/18 1633 01/17/18 2117 01/18/18 0756  GLUCAP 152* 258* 180* 247* 165*       Signed:  Oswald Hillock MD.  [facility] 01/18/2018, 11:19 AM

## 2018-01-18 NOTE — Progress Notes (Signed)
Patients states that he is being discharged and does not want CBG checked.

## 2018-01-18 NOTE — Care Management Note (Signed)
Case Management Note  Patient Details  Name: Robert Compton MRN: [id number] Date of Birth: [birth year]  Subjective/Objective:     Anasarca, DM               Action/Plan:  NCM spoke to pt and dtr (via phone). Pt was active with [facility]. Contacted to [place] to make aware of dc home today with resumption of care. Pt has RW at home.    Expected Discharge Date:  01/18/18               Expected Discharge Plan:  [place]  In-House Referral:  NA  Discharge planning Services  CM Consult  Post Acute Care Choice:  Home Health Choice offered to:  Adult Children  DME Arranged:  N/A DME Agency:  NA  HH Arranged:  RN, OT HH Agency:  [facility]  Status of Service:  Completed, signed off  If discussed at [place] of Stay Meetings, dates discussed:    Additional Comments:  Erenest Rasher, RN 01/18/2018, 12:06 PM

## 2018-01-20 ENCOUNTER — Telehealth: Payer: Self-pay | Admitting: *Deleted

## 2018-01-20 ENCOUNTER — Inpatient Hospital Stay: Payer: BLUE CROSS/BLUE SHIELD

## 2018-01-20 ENCOUNTER — Inpatient Hospital Stay: Payer: BLUE CROSS/BLUE SHIELD | Attending: Internal Medicine | Admitting: Internal Medicine

## 2018-01-20 ENCOUNTER — Encounter: Payer: Self-pay | Admitting: Internal Medicine

## 2018-01-20 ENCOUNTER — Other Ambulatory Visit: Payer: Self-pay | Admitting: *Deleted

## 2018-01-20 VITALS — BP 108/56 | HR 80 | Temp 98.2°F | Resp 14 | Ht 75.0 in | Wt 267.2 lb

## 2018-01-20 DIAGNOSIS — D701 Agranulocytosis secondary to cancer chemotherapy: Secondary | ICD-10-CM

## 2018-01-20 DIAGNOSIS — I5032 Chronic diastolic (congestive) heart failure: Secondary | ICD-10-CM

## 2018-01-20 DIAGNOSIS — C3411 Malignant neoplasm of upper lobe, right bronchus or lung: Secondary | ICD-10-CM

## 2018-01-20 DIAGNOSIS — C787 Secondary malignant neoplasm of liver and intrahepatic bile duct: Secondary | ICD-10-CM

## 2018-01-20 DIAGNOSIS — Z5111 Encounter for antineoplastic chemotherapy: Secondary | ICD-10-CM

## 2018-01-20 DIAGNOSIS — C3490 Malignant neoplasm of unspecified part of unspecified bronchus or lung: Secondary | ICD-10-CM

## 2018-01-20 DIAGNOSIS — D649 Anemia, unspecified: Secondary | ICD-10-CM

## 2018-01-20 DIAGNOSIS — D6959 Other secondary thrombocytopenia: Secondary | ICD-10-CM

## 2018-01-20 DIAGNOSIS — M7989 Other specified soft tissue disorders: Secondary | ICD-10-CM | POA: Diagnosis not present

## 2018-01-20 DIAGNOSIS — T451X5A Adverse effect of antineoplastic and immunosuppressive drugs, initial encounter: Secondary | ICD-10-CM

## 2018-01-20 DIAGNOSIS — D61810 Antineoplastic chemotherapy induced pancytopenia: Secondary | ICD-10-CM

## 2018-01-20 DIAGNOSIS — I10 Essential (primary) hypertension: Secondary | ICD-10-CM

## 2018-01-20 LAB — CMP (CANCER CENTER ONLY)
ALT: 27 U/L (ref 0–44)
ANION GAP: 10 (ref 5–15)
AST: 23 U/L (ref 15–41)
Albumin: 2.7 g/dL — ABNORMAL LOW (ref 3.5–5.0)
Alkaline Phosphatase: 114 U/L (ref 38–126)
BILIRUBIN TOTAL: 1.4 mg/dL — AB (ref 0.3–1.2)
BUN: 24 mg/dL — AB (ref 8–23)
CHLORIDE: 98 mmol/L (ref 98–111)
CO2: 28 mmol/L (ref 22–32)
Calcium: 8.6 mg/dL — ABNORMAL LOW (ref 8.9–10.3)
Creatinine: 0.9 mg/dL (ref 0.61–1.24)
GFR, Est AFR Am: >60 mL/min (ref >60)
GFR, Estimated: >60 mL/min (ref >60)
Glucose, Bld: 208 mg/dL — ABNORMAL HIGH (ref 70–99)
POTASSIUM: 3.5 mmol/L (ref 3.5–5.1)
Sodium: 136 mmol/L (ref 135–145)
TOTAL PROTEIN: 6.6 g/dL (ref 6.5–8.1)

## 2018-01-20 LAB — CBC WITH DIFFERENTIAL (CANCER CENTER ONLY)
BASOS ABS: 0 10*3/uL (ref 0.0–0.1)
Basophils Relative: 2 %
EOS PCT: 3 %
Eosinophils Absolute: 0 10*3/uL (ref 0.0–0.5)
HCT: 21.9 % — ABNORMAL LOW (ref 38.4–49.9)
HEMOGLOBIN: 7.3 g/dL — AB (ref 13.0–17.1)
LYMPHS PCT: 54 %
Lymphs Abs: 0.7 10*3/uL — ABNORMAL LOW (ref 0.9–3.3)
MCH: 33.9 pg — ABNORMAL HIGH (ref 27.2–33.4)
MCHC: 33.5 g/dL (ref 32.0–36.0)
MCV: 101.1 fL — AB (ref 79.3–98.0)
Monocytes Absolute: 0.2 10*3/uL (ref 0.1–0.9)
Monocytes Relative: 16 %
NEUTROS ABS: 0.3 10*3/uL — AB (ref 1.5–6.5)
NEUTROS PCT: 25 %
PLATELETS: 20 10*3/uL — AB (ref 140–400)
RBC: 2.17 MIL/uL — ABNORMAL LOW (ref 4.20–5.82)
RDW: 18.4 % — ABNORMAL HIGH (ref 11.0–14.6)
WBC: 1.2 10*3/uL — AB (ref 4.0–10.3)

## 2018-01-20 LAB — ABO/RH: ABO/RH(D): A POS

## 2018-01-20 MED ORDER — TBO-FILGRASTIM 480 MCG/0.8ML ~~LOC~~ SOSY
480.0000 ug | PREFILLED_SYRINGE | Freq: Once | SUBCUTANEOUS | Status: AC
  Administered 2018-01-20: 480 ug via SUBCUTANEOUS

## 2018-01-20 MED ORDER — TRAMADOL HCL 50 MG PO TABS: 50.0000 mg | ORAL_TABLET | Freq: Four times a day (QID) | ORAL | 0 refills | Status: DC | PRN

## 2018-01-20 MED ORDER — TBO-FILGRASTIM 480 MCG/0.8ML ~~LOC~~ SOSY
PREFILLED_SYRINGE | SUBCUTANEOUS | Status: AC
  Filled 2018-01-20: qty 0.8

## 2018-01-20 MED ORDER — TBO-FILGRASTIM 480 MCG/0.8ML ~~LOC~~ SOSY: 480.0000 ug | PREFILLED_SYRINGE | Freq: Once | SUBCUTANEOUS | Status: DC

## 2018-01-20 MED ORDER — DOXYCYCLINE HYCLATE 100 MG PO TABS: 100.0000 mg | ORAL_TABLET | Freq: Two times a day (BID) | ORAL | 0 refills | Status: DC

## 2018-01-20 NOTE — Progress Notes (Signed)
[place] [phone]   [phone]  OFFICE PROGRESS NOTE  Robert Solian, MD [place] [address]  DIAGNOSIS: Extensive stage small cell lung cancer presented with right upper lobe lung mass in addition to few other pulmonary nodules and significant mediastinal lymphadenopathy and liver metastases diagnosed in June 2019.   PRIOR THERAPY: None  CURRENT THERAPY: Systemic chemotherapy with carboplatin for AUC of 4 on day 1 and etoposide 100 mg/M2 on days 1, 2 and 3 with Neulasta support every 3 weeks.  Status post 5 cycles.  INTERVAL HISTORY: Robert Compton 72 y.o. male returns to the clinic today for follow-up visit accompanied by his daughter and son.  The patient continues to complain of increasing fatigue and weakness as well as erythema and swelling in the right forearm.  He was recently admitted to [facility] with significant pancytopenia and neutropenic fever.  He was treated with a course of antibiotics during his hospitalization.  He was discharged 2 days ago.  The patient continues to feel tired and weak.  He denied having any chest pain but has shortness of breath with exertion with no cough or hemoptysis.  He denied having any fever or chills.  He has no current nausea, vomiting, diarrhea or constipation.  He is here today for evaluation and repeat blood work.  MEDICAL HISTORY: Past Medical History:  Diagnosis Date  . Arthritis   . Cancer ([place])   . Diabetes mellitus without complication ([place])   . Hypertension     ALLERGIES:  is allergic to amoxicillin-pot clavulanate and nitrofurantoin.  MEDICATIONS:  Current Outpatient Medications  Medication Sig Dispense Refill  . carvedilol (COREG) 3.125 MG tablet Take 1 tablet (3.125 mg total) by mouth 2 (two) times daily with a meal. 60 tablet 0  . CINNAMON PO Take 600 mg by mouth 2 (two) times daily.    . Cyanocobalamin (VITAMIN B-12 PO) Take 1 tablet by mouth  daily.    . fish oil-omega-3 fatty acids 1000 MG capsule Take 1 g by mouth 2 (two) times daily.    . fluticasone (FLONASE) 50 MCG/ACT nasal spray Place 2 sprays into both nostrils daily as needed for allergies. 16 g 0  . furosemide (LASIX) 20 MG tablet Take 3 tablets (60 mg total) by mouth 2 (two) times daily. 60 tablet 2  . insulin aspart (NOVOLOG) 100 UNIT/ML injection CBG < 70: implement hypoglycemia protocol CBG 70 - 120: 0 units CBG 121 - 150: 2 units CBG 151 - 200: 3 units CBG 201 - 250: 5 units CBG 251 - 300: 8 units CBG 301 - 350: 11 units CBG 351 - 400: 15 units CBG > 400: call MD 10 mL 11  . insulin glargine (LANTUS) 100 UNIT/ML injection Inject 0.1 mLs (10 Units total) into the skin at bedtime. 10 mL 11  . loratadine (CLARITIN) 10 MG tablet Take 1 tablet (10 mg total) by mouth daily. 30 tablet 0  . metFORMIN (GLUCOPHAGE) 1000 MG tablet Take 1,000 mg by mouth 2 (two) times daily with a meal.    . nicotine (NICODERM CQ - DOSED IN MG/24 HOURS) 21 mg/24hr patch Place 1 patch (21 mg total) onto the skin daily. (Patient not taking: Reported on 01/10/2018) 28 patch 0  . omeprazole (PRILOSEC) 20 MG capsule Take 20 mg by mouth daily.    . polyethylene glycol (MIRALAX / GLYCOLAX) packet Take 17 g by mouth daily. (Patient taking differently: Take 17 g  by mouth daily as needed for moderate constipation. ) 14 each 0  . Potassium Chloride ER 20 MEQ TBCR Take 20 mEq by mouth 2 (two) times daily. 60 tablet 0  . Red Yeast Rice 600 MG TABS Take 1 tablet by mouth 2 (two) times daily.     Marland Kitchen senna-docusate (SENOKOT-S) 8.6-50 MG tablet Take 1 tablet by mouth at bedtime as needed for mild constipation. 30 tablet 1  . traMADol (ULTRAM) 50 MG tablet Take 1 tablet (50 mg total) by mouth every 6 (six) hours as needed for moderate pain. (Patient not taking: Reported on 12/09/2017) 20 tablet 0   No current facility-administered medications for this visit.     SURGICAL HISTORY:  Past Surgical History:  Procedure  Laterality Date  . HERNIA REPAIR    . JOINT REPLACEMENT Right    knee  . LIVER BIOPSY      REVIEW OF SYSTEMS:  Constitutional: positive for fatigue Eyes: negative Ears, nose, mouth, throat, and face: negative Respiratory: positive for dyspnea on exertion Cardiovascular: negative Gastrointestinal: negative Genitourinary:negative Integument/breast: negative Hematologic/lymphatic: negative Musculoskeletal:positive for muscle weakness Neurological: negative Behavioral/Psych: negative Endocrine: negative Allergic/Immunologic: negative   PHYSICAL EXAMINATION: General appearance: alert, cooperative, fatigued and no distress Head: Normocephalic, without obvious abnormality, atraumatic Neck: no adenopathy, no JVD, supple, symmetrical, trachea midline and thyroid not enlarged, symmetric, no tenderness/mass/nodules Lymph nodes: Cervical, supraclavicular, and axillary nodes normal. Resp: clear to auscultation bilaterally Back: symmetric, no curvature. ROM normal. No CVA tenderness. Cardio: regular rate and rhythm, S1, S2 normal, no murmur, click, rub or gallop GI: soft, non-tender; bowel sounds normal; no masses,  no organomegaly Extremities: edema 2+ edema Neurologic: Alert and oriented X 3, normal strength and tone. Normal symmetric reflexes. Normal coordination and gait  ECOG PERFORMANCE STATUS: 2 - Symptomatic, <50% confined to bed  Blood pressure (!) 108/56, pulse 80, temperature 98.2 F (36.8 C), temperature source Oral, resp. rate 14, height 6\' 3"  (1.905 m), weight 267 lb 3.2 oz (121.2 kg), SpO2 100 %.  LABORATORY DATA: Lab Results  Component Value Date   WBC 1.2 (L) 01/20/2018   HGB 7.3 (L) 01/20/2018   HCT 21.9 (L) 01/20/2018   MCV 101.1 (H) 01/20/2018   PLT 20 (L) 01/20/2018      Chemistry      Component Value Date/Time   NA 135 01/18/2018 0342   K 3.6 01/18/2018 0342   CL 95 (L) 01/18/2018 0342   CO2 30 01/18/2018 0342   BUN 23 01/18/2018 0342   CREATININE 0.95  01/18/2018 0342   CREATININE 0.91 01/07/2018 1303      Component Value Date/Time   CALCIUM 8.2 (L) 01/18/2018 0342   ALKPHOS 97 01/16/2018 0602   AST 38 01/16/2018 0602   AST 34 01/07/2018 1303   ALT 35 01/16/2018 0602   ALT 28 01/07/2018 1303   BILITOT 1.0 01/16/2018 0602   BILITOT 1.2 01/07/2018 1303       RADIOGRAPHIC STUDIES: Dg Chest 2 View  Result Date: 01/10/2018 CLINICAL DATA:  Fluid overload. EXAM: CHEST - 2 VIEW COMPARISON:  Radiograph of December 13, 2017. FINDINGS: The heart size and mediastinal contours are within normal limits. Both lungs are clear. No pneumothorax or pleural effusion is noted. The visualized skeletal structures are unremarkable. IMPRESSION: No active cardiopulmonary disease. Electronically Signed   By: Marijo Conception, M.D.   On: 01/10/2018 21:46   US Abdomen Limited  Result Date: 12/23/2017 CLINICAL DATA:  72 year old male with abdominal distension, possible ascites EXAM:  LIMITED ABDOMEN ULTRASOUND FOR ASCITES TECHNIQUE: Limited ultrasound survey for ascites was performed in all four abdominal quadrants. COMPARISON:  Prior abdominal ultrasound 12/14/2017 FINDINGS: Sonographic interrogation of the 4 quadrants of the abdomen demonstrates only a small volume ascites in the left hemiabdomen. The fluid is deemed insufficient for paracentesis. IMPRESSION: Small volume ascites, insufficient for paracentesis. Electronically Signed   By: Jacqulynn Cadet M.D.   On: 12/23/2017 15:18   US Paracentesis  Result Date: 01/11/2018 INDICATION: Patient with malignant ascites. Request is made for diagnostic and therapeutic paracentesis. EXAM: ULTRASOUND GUIDED DIAGNOSTIC AND THERAPEUTIC PARACENTESIS MEDICATIONS: 10 mL 1% lidocaine COMPLICATIONS: None immediate. PROCEDURE: Informed written consent was obtained from the patient after a discussion of the risks, benefits and alternatives to treatment. A timeout was performed prior to the initiation of the procedure. Initial  ultrasound scanning demonstrates a moderate amount of ascites within the left lateral. The left lateral abdomen was prepped and draped in the usual sterile fashion. 1% lidocaine was used for local anesthesia. Following this, a 19 gauge, 7-cm, Yueh catheter was introduced. An ultrasound image was saved for documentation purposes. The paracentesis was performed. The catheter was removed and a dressing was applied. The patient tolerated the procedure well without immediate post procedural complication. FINDINGS: A total of approximately 5.2 liters of yellow fluid was removed. Samples were sent to the laboratory as requested by the clinical team. IMPRESSION: Successful ultrasound-guided diagnostic and therapeutic paracentesis yielding 5.2 liters of peritoneal fluid. Read by: Brynda Greathouse PA-C Electronically Signed   By: Marybelle Killings M.D.   On: 01/11/2018 12:31    ASSESSMENT AND PLAN: This is a very pleasant 72 years old white male diagnosed with extensive stage small cell lung cancer presented with right upper lobe lung mass in addition to pulmonary nodules, mediastinal lymphadenopathy and extensive liver metastasis in June 2019. The patient is currently undergoing systemic chemotherapy with carboplatin and etoposide status post 5 cycles. The patient has a rough time with his chemotherapy with significant pancytopenia.  He missed his Neulasta injection after cycle #5.  He was admitted recently to [facility] with neutropenic fever as well as pancytopenia. The patient is feeling a little bit better today but continues to have increasing fatigue and weakness.  He also continues to have significant pancytopenia. I will arrange for the patient to have 2 units of PRBCs transfusion in the next 2 days. For the neutropenia, I will arrange for the patient to receive Granix injection today and tomorrow. For the thrombocytopenia, we will continue to monitor this closely and consider The patient for transfusion  if his platelets count is less than 20,000. For the questionable cellulitis at the right forearm, I started the patient on doxycycline 100 mg p.o. twice daily for 10 days. I will cancel cycle #6 of his treatment because of intolerance. I will see the patient back for follow-up visit in 1 month for evaluation with repeat CT scan of the chest, abdomen and pelvis for restaging of his disease. For pain management I gave the patient a refill of tramadol today. The patient voices understanding of current disease status and treatment options and is in agreement with the current care plan. All questions were answered. The patient knows to call the clinic with any problems, questions or concerns. We can certainly see the patient much sooner if necessary.  Disclaimer: This note was dictated with voice recognition software. Similar sounding words can inadvertently be transcribed and may not be corrected upon review.

## 2018-01-20 NOTE — Telephone Encounter (Signed)
error 

## 2018-01-20 NOTE — Telephone Encounter (Signed)
Pt scheduled for 2 units Blood transfusion tomorrow at 8am. Verified with Chanda ( [facility] Scheduling), pt can receive granix injection at sickle cell. Orders are on pt mar.

## 2018-01-21 ENCOUNTER — Ambulatory Visit: Payer: BLUE CROSS/BLUE SHIELD

## 2018-01-21 ENCOUNTER — Ambulatory Visit (HOSPITAL_COMMUNITY)
Admission: RE | Admit: 2018-01-21 | Discharge: 2018-01-21 | Disposition: A | Discharge status: Home / Self Care | Payer: BLUE CROSS/BLUE SHIELD | Source: Ambulatory Visit | Attending: Internal Medicine | Admitting: Internal Medicine

## 2018-01-21 ENCOUNTER — Inpatient Hospital Stay: Payer: BLUE CROSS/BLUE SHIELD

## 2018-01-21 DIAGNOSIS — D649 Anemia, unspecified: Secondary | ICD-10-CM | POA: Insufficient documentation

## 2018-01-21 DIAGNOSIS — C3411 Malignant neoplasm of upper lobe, right bronchus or lung: Secondary | ICD-10-CM | POA: Diagnosis not present

## 2018-01-21 LAB — PREPARE RBC (CROSSMATCH)

## 2018-01-21 MED ORDER — FUROSEMIDE 10 MG/ML IJ SOLN
20.0000 mg | Freq: Once | INTRAMUSCULAR | Status: AC
  Administered 2018-01-21: 20 mg via INTRAVENOUS
  Filled 2018-01-21: qty 2

## 2018-01-21 MED ORDER — HEPARIN SOD (PORK) LOCK FLUSH 100 UNIT/ML IV SOLN: 500.0000 [IU] | Freq: Every day | INTRAVENOUS | Status: DC | PRN

## 2018-01-21 MED ORDER — SODIUM CHLORIDE 0.9% IV SOLUTION
250.0000 mL | Freq: Once | INTRAVENOUS | Status: AC
  Administered 2018-01-21: 250 mL via INTRAVENOUS

## 2018-01-21 MED ORDER — DIPHENHYDRAMINE HCL 25 MG PO CAPS
25.0000 mg | ORAL_CAPSULE | Freq: Once | ORAL | Status: AC
  Administered 2018-01-21: 25 mg via ORAL
  Filled 2018-01-21: qty 1

## 2018-01-21 MED ORDER — SODIUM CHLORIDE 0.9% FLUSH: 10.0000 mL | INTRAVENOUS | Status: DC | PRN

## 2018-01-21 MED ORDER — ACETAMINOPHEN 325 MG PO TABS
650.0000 mg | ORAL_TABLET | Freq: Once | ORAL | Status: AC
  Administered 2018-01-21: 650 mg via ORAL
  Filled 2018-01-21: qty 2

## 2018-01-21 MED ORDER — TBO-FILGRASTIM 480 MCG/0.8ML ~~LOC~~ SOSY
480.0000 ug | PREFILLED_SYRINGE | Freq: Once | SUBCUTANEOUS | Status: AC
  Administered 2018-01-21: 480 ug via SUBCUTANEOUS
  Filled 2018-01-21: qty 0.8

## 2018-01-21 NOTE — Discharge Instructions (Signed)
Tbo-Filgrastim injection What is this medicine? TBO-FILGRASTIM (T B O fil GRA stim) is a granulocyte colony-stimulating factor that stimulates the growth of neutrophils, a type of white blood cell important in the body's fight against infection. It is used to reduce the incidence of fever and infection in patients with certain types of cancer who are receiving chemotherapy that affects the bone marrow. This medicine may be used for other purposes; ask your health care provider or pharmacist if you have questions. COMMON BRAND NAME(S): Granix What should I tell my health care provider before I take this medicine? They need to know if you have any of these conditions: -bone scan or tests planned -kidney disease -sickle cell anemia -an unusual or allergic reaction to tbo-filgrastim, filgrastim, pegfilgrastim, other medicines, foods, dyes, or preservatives -pregnant or trying to get pregnant -breast-feeding How should I use this medicine? This medicine is for injection under the skin. If you get this medicine at home, you will be taught how to prepare and give this medicine. Refer to the Instructions for Use that come with your medication packaging. Use exactly as directed. Take your medicine at regular intervals. Do not take your medicine more often than directed. It is important that you put your used needles and syringes in a special sharps container. Do not put them in a trash can. If you do not have a sharps container, call your pharmacist or healthcare provider to get one. Talk to your pediatrician regarding the use of this medicine in children. Special care may be needed. Overdosage: If you think you have taken too much of this medicine contact a poison control center or emergency room at once. NOTE: This medicine is only for you. Do not share this medicine with others. What if I miss a dose? It is important not to miss your dose. Call your doctor or health care professional if you miss a  dose. What may interact with this medicine? This medicine may interact with the following medications: -medicines that may cause a release of neutrophils, such as lithium This list may not describe all possible interactions. Give your health care provider a list of all the medicines, herbs, non-prescription drugs, or dietary supplements you use. Also tell them if you smoke, drink alcohol, or use illegal drugs. Some items may interact with your medicine. What should I watch for while using this medicine? You may need blood work done while you are taking this medicine. What side effects may I notice from receiving this medicine? Side effects that you should report to your doctor or health care professional as soon as possible: -allergic reactions like skin rash, itching or hives, swelling of the face, lips, or tongue -blood in the urine -dark urine -dizziness -fast heartbeat -feeling faint -shortness of breath or breathing problems -signs and symptoms of infection like fever or chills; cough; or sore throat -signs and symptoms of kidney injury like trouble passing urine or change in the amount of urine -stomach or side pain, or pain at the shoulder -sweating -swelling of the legs, ankles, or abdomen -tiredness Side effects that usually do not require medical attention (report to your doctor or health care professional if they continue or are bothersome): -bone pain -headache -muscle pain -vomiting This list may not describe all possible side effects. Call your doctor for medical advice about side effects. You may report side effects to FDA at 1-800-FDA-1088. Where should I keep my medicine? Keep out of the reach of children. Store in a refrigerator between  2 and 8 degrees C (36 and 46 degrees F). Keep in carton to protect from light. Throw away this medicine if it is left out of the refrigerator for more than 5 consecutive days. Throw away any unused medicine after the expiration  date. NOTE: This sheet is a summary. It may not cover all possible information. If you have questions about this medicine, talk to your doctor, pharmacist, or health care provider.  2018 Elsevier/Gold Standard (2015-05-23 19:07:04)  Blood Transfusion, Care After This sheet gives you information about how to care for yourself after your procedure. Your doctor may also give you more specific instructions. If you have problems or questions, contact your doctor. Follow these instructions at home:  Take over-the-counter and prescription medicines only as told by your doctor.  Go back to your normal activities as told by your doctor.  Follow instructions from your doctor about how to take care of the area where an IV tube was put into your vein (insertion site). Make sure you: ? Wash your hands with soap and water before you change your bandage (dressing). If there is no soap and water, use hand sanitizer. ? Change your bandage as told by your doctor.  Check your IV insertion site every day for signs of infection. Check for: ? More redness, swelling, or pain. ? More fluid or blood. ? Warmth. ? Pus or a bad smell. Contact a doctor if:  You have more redness, swelling, or pain around the IV insertion site..  You have more fluid or blood coming from the IV insertion site.  Your IV insertion site feels warm to the touch.  You have pus or a bad smell coming from the IV insertion site.  Your pee (urine) turns pink, red, or brown.  You feel weak after doing your normal activities. Get help right away if:  You have signs of a serious allergic or body defense (immune) system reaction, including: ? Itchiness. ? Hives. ? Trouble breathing. ? Anxiety. ? Pain in your chest or lower back. ? Fever, flushing, and chills. ? Fast pulse. ? Rash. ? Watery poop (diarrhea). ? Throwing up (vomiting). ? Dark pee. ? Serious headache. ? Dizziness. ? Stiff neck. ? Yellow color in your face or the  white parts of your eyes (jaundice). Summary  After a blood transfusion, return to your normal activities as told by your doctor.  Every day, check for signs of infection where the IV tube was put into your vein.  Some signs of infection are warm skin, more redness and pain, more fluid or blood, and pus or a bad smell where the needle went in.  Contact your doctor if you feel weak or have any unusual symptoms. This information is not intended to replace advice given to you by your health care provider. Make sure you discuss any questions you have with your health care provider. Document Released: 04/23/2014 Document Revised: 11/25/2015 Document Reviewed: 11/25/2015 Elsevier Interactive Patient Education  2017 [employer].

## 2018-01-21 NOTE — Accreditation Note (Signed)
Patient received 2 units of packed RBC via PIV. Pre- tansfusion medications - Tylenol and Benadryl were given. In between transfusion medication - Lasix was also administered as prescribed. Post transfusion, prescribed sub Q  Granix was also administered. Tolerated well, vitals stable, discharge instructions given, verbalized understanding. Patient alert, oriented and per patient request, now at the reception room to wait for his daughter in a wheel chair at the time of discharge.

## 2018-01-22 ENCOUNTER — Inpatient Hospital Stay: Payer: BLUE CROSS/BLUE SHIELD

## 2018-01-22 ENCOUNTER — Ambulatory Visit: Payer: BLUE CROSS/BLUE SHIELD

## 2018-01-22 LAB — TYPE AND SCREEN
ABO/RH(D): A POS
Antibody Screen: NEGATIVE
UNIT DIVISION: 0
Unit division: 0

## 2018-01-22 LAB — BPAM RBC
BLOOD PRODUCT EXPIRATION DATE: 201910292359
BLOOD PRODUCT EXPIRATION DATE: 201910292359
ISSUE DATE / TIME: 201910080915
ISSUE DATE / TIME: 201910080915
UNIT TYPE AND RH: 6200
Unit Type and Rh: 6200

## 2018-01-24 LAB — ACID FAST SMEAR (AFB, MYCOBACTERIA)

## 2018-01-24 LAB — ACID FAST SMEAR (AFB): ACID FAST SMEAR - AFSCU2: NEGATIVE

## 2018-02-14 ENCOUNTER — Ambulatory Visit (HOSPITAL_COMMUNITY)
Admission: RE | Admit: 2018-02-14 | Discharge: 2018-02-14 | Disposition: A | Discharge status: Home / Self Care | Payer: BLUE CROSS/BLUE SHIELD | Source: Ambulatory Visit | Attending: Internal Medicine | Admitting: Internal Medicine

## 2018-02-14 ENCOUNTER — Inpatient Hospital Stay: Payer: BLUE CROSS/BLUE SHIELD | Attending: Internal Medicine

## 2018-02-14 DIAGNOSIS — D61818 Other pancytopenia: Secondary | ICD-10-CM | POA: Insufficient documentation

## 2018-02-14 DIAGNOSIS — R590 Localized enlarged lymph nodes: Secondary | ICD-10-CM | POA: Diagnosis not present

## 2018-02-14 DIAGNOSIS — R918 Other nonspecific abnormal finding of lung field: Secondary | ICD-10-CM | POA: Insufficient documentation

## 2018-02-14 DIAGNOSIS — M899 Disorder of bone, unspecified: Secondary | ICD-10-CM | POA: Diagnosis not present

## 2018-02-14 DIAGNOSIS — I10 Essential (primary) hypertension: Secondary | ICD-10-CM | POA: Insufficient documentation

## 2018-02-14 DIAGNOSIS — C3411 Malignant neoplasm of upper lobe, right bronchus or lung: Secondary | ICD-10-CM | POA: Diagnosis present

## 2018-02-14 DIAGNOSIS — C787 Secondary malignant neoplasm of liver and intrahepatic bile duct: Secondary | ICD-10-CM | POA: Diagnosis not present

## 2018-02-14 DIAGNOSIS — K769 Liver disease, unspecified: Secondary | ICD-10-CM | POA: Insufficient documentation

## 2018-02-14 DIAGNOSIS — R188 Other ascites: Secondary | ICD-10-CM | POA: Diagnosis not present

## 2018-02-14 DIAGNOSIS — C3490 Malignant neoplasm of unspecified part of unspecified bronchus or lung: Secondary | ICD-10-CM | POA: Diagnosis not present

## 2018-02-14 DIAGNOSIS — E119 Type 2 diabetes mellitus without complications: Secondary | ICD-10-CM | POA: Insufficient documentation

## 2018-02-14 DIAGNOSIS — Z79899 Other long term (current) drug therapy: Secondary | ICD-10-CM | POA: Insufficient documentation

## 2018-02-14 DIAGNOSIS — Z794 Long term (current) use of insulin: Secondary | ICD-10-CM | POA: Diagnosis not present

## 2018-02-14 LAB — CMP (CANCER CENTER ONLY)
ALT: 29 U/L (ref 0–44)
AST: 50 U/L — ABNORMAL HIGH (ref 15–41)
Albumin: 2.3 g/dL — ABNORMAL LOW (ref 3.5–5.0)
Alkaline Phosphatase: 111 U/L (ref 38–126)
Anion gap: 12 (ref 5–15)
BUN: 19 mg/dL (ref 8–23)
CO2: 25 mmol/L (ref 22–32)
Calcium: 8.3 mg/dL — ABNORMAL LOW (ref 8.9–10.3)
Chloride: 100 mmol/L (ref 98–111)
Creatinine: 1.2 mg/dL (ref 0.61–1.24)
GFR, Est AFR Am: >60 mL/min
GFR, Estimated: 60 mL/min — ABNORMAL LOW
Glucose, Bld: 132 mg/dL — ABNORMAL HIGH (ref 70–99)
Potassium: 3.9 mmol/L (ref 3.5–5.1)
Sodium: 137 mmol/L (ref 135–145)
Total Bilirubin: 2.4 mg/dL — ABNORMAL HIGH (ref 0.3–1.2)
Total Protein: 6.8 g/dL (ref 6.5–8.1)

## 2018-02-14 LAB — CBC WITH DIFFERENTIAL (CANCER CENTER ONLY)
Abs Immature Granulocytes: 0.07 10*3/uL (ref 0.00–0.07)
Basophils Absolute: 0 10*3/uL (ref 0.0–0.1)
Basophils Relative: 1 %
EOS ABS: 0.1 10*3/uL (ref 0.0–0.5)
EOS PCT: 2 %
HEMATOCRIT: 32.5 % — AB (ref 39.0–52.0)
Hemoglobin: 10.7 g/dL — ABNORMAL LOW (ref 13.0–17.0)
Immature Granulocytes: 1 %
Lymphocytes Relative: 20 %
Lymphs Abs: 1.2 10*3/uL (ref 0.7–4.0)
MCH: 34.3 pg — ABNORMAL HIGH (ref 26.0–34.0)
MCHC: 32.9 g/dL (ref 30.0–36.0)
MCV: 104.2 fL — AB (ref 80.0–100.0)
MONO ABS: 1 10*3/uL (ref 0.1–1.0)
MONOS PCT: 18 %
Neutro Abs: 3.3 10*3/uL (ref 1.7–7.7)
Neutrophils Relative %: 58 %
Platelet Count: 77 10*3/uL — ABNORMAL LOW (ref 150–400)
RBC: 3.12 MIL/uL — ABNORMAL LOW (ref 4.22–5.81)
RDW: 17.9 % — AB (ref 11.5–15.5)
WBC Count: 5.7 10*3/uL (ref 4.0–10.5)
nRBC: 0 % (ref 0.0–0.2)

## 2018-02-14 MED ORDER — SODIUM CHLORIDE 0.9 % IJ SOLN
INTRAMUSCULAR | Status: AC
  Filled 2018-02-14: qty 50

## 2018-02-14 MED ORDER — IOHEXOL 300 MG/ML  SOLN
100.0000 mL | Freq: Once | INTRAMUSCULAR | Status: AC | PRN
  Administered 2018-02-14: 100 mL via INTRAVENOUS

## 2018-02-17 ENCOUNTER — Inpatient Hospital Stay (HOSPITAL_BASED_OUTPATIENT_CLINIC_OR_DEPARTMENT_OTHER): Payer: BLUE CROSS/BLUE SHIELD | Admitting: Internal Medicine

## 2018-02-17 ENCOUNTER — Encounter: Payer: Self-pay | Admitting: Internal Medicine

## 2018-02-17 ENCOUNTER — Telehealth: Payer: Self-pay | Admitting: Internal Medicine

## 2018-02-17 VITALS — BP 120/68 | HR 92 | Temp 97.6°F | Resp 18 | Ht 75.0 in | Wt 285.4 lb

## 2018-02-17 DIAGNOSIS — E119 Type 2 diabetes mellitus without complications: Secondary | ICD-10-CM

## 2018-02-17 DIAGNOSIS — R188 Other ascites: Secondary | ICD-10-CM

## 2018-02-17 DIAGNOSIS — D649 Anemia, unspecified: Secondary | ICD-10-CM

## 2018-02-17 DIAGNOSIS — C787 Secondary malignant neoplasm of liver and intrahepatic bile duct: Secondary | ICD-10-CM

## 2018-02-17 DIAGNOSIS — Z794 Long term (current) use of insulin: Secondary | ICD-10-CM

## 2018-02-17 DIAGNOSIS — I10 Essential (primary) hypertension: Secondary | ICD-10-CM

## 2018-02-17 DIAGNOSIS — C3411 Malignant neoplasm of upper lobe, right bronchus or lung: Secondary | ICD-10-CM | POA: Diagnosis not present

## 2018-02-17 DIAGNOSIS — D61818 Other pancytopenia: Secondary | ICD-10-CM

## 2018-02-17 DIAGNOSIS — R5382 Chronic fatigue, unspecified: Secondary | ICD-10-CM

## 2018-02-17 DIAGNOSIS — Z79899 Other long term (current) drug therapy: Secondary | ICD-10-CM

## 2018-02-17 DIAGNOSIS — C3490 Malignant neoplasm of unspecified part of unspecified bronchus or lung: Secondary | ICD-10-CM

## 2018-02-17 MED ORDER — TRAMADOL HCL 50 MG PO TABS: 50.0000 mg | ORAL_TABLET | Freq: Four times a day (QID) | ORAL | 0 refills | Status: DC | PRN

## 2018-02-17 NOTE — Telephone Encounter (Signed)
Appts scheduled avs/calendar printed per 11/4 los

## 2018-02-17 NOTE — Progress Notes (Signed)
[place] [phone]   [phone]  OFFICE PROGRESS NOTE  Robert Solian, MD [place] [address]  DIAGNOSIS: Extensive stage small cell lung cancer presented with right upper lobe lung mass in addition to few other pulmonary nodules and significant mediastinal lymphadenopathy and liver metastases diagnosed in June 2019.   PRIOR THERAPY: Systemic chemotherapy with carboplatin for AUC of 4 on day 1 and etoposide 100 mg/M2 on days 1, 2 and 3 with Neulasta support every 3 weeks.  Status post 5 cycles.  CURRENT THERAPY: Maintenance treatment with single agent Tecentriq 1200 mg IV every 3 weeks.  First dose February 24, 2018.  INTERVAL HISTORY: Robert Compton 72 y.o. male returns to the clinic today for follow-up visit accompanied by his daughter and son-in-law.  The patient continues to complain of increasing fatigue and weakness as well as abdominal distention.  He denied having any chest pain but has shortness of breath with exertion and especially when he lays down.  He denied having any cough or hemoptysis.  He denied having any fever or chills.  He has no nausea, vomiting, diarrhea or constipation.  Has been off treatment for the last few weeks secondary to significant pancytopenia.  He had repeat CT scan of the chest, abdomen and pelvis performed recently and he is here for evaluation and discussion of his discuss results.   MEDICAL HISTORY: Past Medical History:  Diagnosis Date  . Arthritis   . Cancer ([facility])   . Diabetes mellitus without complication ([place])   . Hypertension     ALLERGIES:  is allergic to amoxicillin-pot clavulanate and nitrofurantoin.  MEDICATIONS:  Current Outpatient Medications  Medication Sig Dispense Refill  . carvedilol (COREG) 3.125 MG tablet Take 1 tablet (3.125 mg total) by mouth 2 (two) times daily with a meal. 60 tablet 0  . CINNAMON PO Take 600 mg by mouth 2 (two) times daily.    .  Cyanocobalamin (VITAMIN B-12 PO) Take 1 tablet by mouth daily.    Marland Kitchen doxycycline (VIBRA-TABS) 100 MG tablet Take 1 tablet (100 mg total) by mouth 2 (two) times daily. 14 tablet 0  . fish oil-omega-3 fatty acids 1000 MG capsule Take 1 g by mouth 2 (two) times daily.    . fluticasone (FLONASE) 50 MCG/ACT nasal spray Place 2 sprays into both nostrils daily as needed for allergies. 16 g 0  . furosemide (LASIX) 20 MG tablet Take 3 tablets (60 mg total) by mouth 2 (two) times daily. 60 tablet 2  . insulin aspart (NOVOLOG) 100 UNIT/ML injection CBG < 70: implement hypoglycemia protocol CBG 70 - 120: 0 units CBG 121 - 150: 2 units CBG 151 - 200: 3 units CBG 201 - 250: 5 units CBG 251 - 300: 8 units CBG 301 - 350: 11 units CBG 351 - 400: 15 units CBG > 400: call MD 10 mL 11  . insulin glargine (LANTUS) 100 UNIT/ML injection Inject 0.1 mLs (10 Units total) into the skin at bedtime. 10 mL 11  . loratadine (CLARITIN) 10 MG tablet Take 1 tablet (10 mg total) by mouth daily. 30 tablet 0  . metFORMIN (GLUCOPHAGE) 1000 MG tablet Take 1,000 mg by mouth 2 (two) times daily with a meal.    . nicotine (NICODERM CQ - DOSED IN MG/24 HOURS) 21 mg/24hr patch Place 1 patch (21 mg total) onto the skin daily. (Patient not taking: Reported on 01/10/2018) 28 patch 0  . omeprazole (PRILOSEC) 20 MG  capsule Take 20 mg by mouth daily.    . polyethylene glycol (MIRALAX / GLYCOLAX) packet Take 17 g by mouth daily. (Patient taking differently: Take 17 g by mouth daily as needed for moderate constipation. ) 14 each 0  . Potassium Chloride ER 20 MEQ TBCR Take 20 mEq by mouth 2 (two) times daily. 60 tablet 0  . Red Yeast Rice 600 MG TABS Take 1 tablet by mouth 2 (two) times daily.     Marland Kitchen senna-docusate (SENOKOT-S) 8.6-50 MG tablet Take 1 tablet by mouth at bedtime as needed for mild constipation. 30 tablet 1  . traMADol (ULTRAM) 50 MG tablet Take 1 tablet (50 mg total) by mouth every 6 (six) hours as needed for moderate pain. 20 tablet 0    No current facility-administered medications for this visit.     SURGICAL HISTORY:  Past Surgical History:  Procedure Laterality Date  . HERNIA REPAIR    . JOINT REPLACEMENT Right    knee  . LIVER BIOPSY      REVIEW OF SYSTEMS:  Constitutional: positive for fatigue Eyes: negative Ears, nose, mouth, throat, and face: negative Respiratory: positive for dyspnea on exertion Cardiovascular: negative Gastrointestinal: negative Genitourinary:negative Integument/breast: negative Hematologic/lymphatic: negative Musculoskeletal:positive for muscle weakness Neurological: negative Behavioral/Psych: negative Endocrine: negative Allergic/Immunologic: negative   PHYSICAL EXAMINATION: General appearance: alert, cooperative, fatigued and no distress Head: Normocephalic, without obvious abnormality, atraumatic Neck: no adenopathy, no JVD, supple, symmetrical, trachea midline and thyroid not enlarged, symmetric, no tenderness/mass/nodules Lymph nodes: Cervical, supraclavicular, and axillary nodes normal. Resp: clear to auscultation bilaterally Back: symmetric, no curvature. ROM normal. No CVA tenderness. Cardio: regular rate and rhythm, S1, S2 normal, no murmur, click, rub or gallop GI: soft, non-tender; bowel sounds normal; no masses,  no organomegaly Extremities: edema 2+ edema Neurologic: Alert and oriented X 3, normal strength and tone. Normal symmetric reflexes. Normal coordination and gait  ECOG PERFORMANCE STATUS: 2 - Symptomatic, <50% confined to bed  Blood pressure 120/68, pulse 92, temperature 97.6 F (36.4 C), temperature source Oral, resp. rate 18, height 6\' 3"  (1.905 m), weight 285 lb 6.4 oz (129.5 kg), SpO2 99 %.  LABORATORY DATA: Lab Results  Component Value Date   WBC 5.7 02/14/2018   HGB 10.7 (L) 02/14/2018   HCT 32.5 (L) 02/14/2018   MCV 104.2 (H) 02/14/2018   PLT 77 (L) 02/14/2018      Chemistry      Component Value Date/Time   NA 137 02/14/2018 0900   K  3.9 02/14/2018 0900   CL 100 02/14/2018 0900   CO2 25 02/14/2018 0900   BUN 19 02/14/2018 0900   CREATININE 1.20 02/14/2018 0900      Component Value Date/Time   CALCIUM 8.3 (L) 02/14/2018 0900   ALKPHOS 111 02/14/2018 0900   AST 50 (H) 02/14/2018 0900   ALT 29 02/14/2018 0900   BILITOT 2.4 (H) 02/14/2018 0900       RADIOGRAPHIC STUDIES: Ct Chest W Contrast  Result Date: 02/14/2018 CLINICAL DATA:  "extensive stage small cell lung cancer presented with right upper lobe lung mass in addition to pulmonary nodules, mediastinal lymphadenopathy and extensive liver metastasis in June 2019. EXAM: CT CHEST, ABDOMEN, AND PELVIS WITH CONTRAST TECHNIQUE: Multidetector CT imaging of the chest, abdomen and pelvis was performed following the standard protocol during bolus administration of intravenous contrast. CONTRAST:  122mL OMNIPAQUE IOHEXOL 300 MG/ML  SOLN COMPARISON:  CT abdomen 11/08/2017, chest CT 11/26/2017, PET-CT 10/21/2017 FINDINGS: CT CHEST FINDINGS Cardiovascular: Coronary artery calcification  and aortic atherosclerotic calcification. Mediastinum/Nodes: No axillary or supraclavicular adenopathy. RIGHT lower paratracheal lymph node measuring 12 mm compares to 22 mm on comparison PET-CT scan. No new adenopathy. Lungs/Pleura: Linear thickening in the RIGHT upper lobe at site of prior nodule. No new nodularity Two nodules along the lower RIGHT horizontal fissure (image 30/2) unchanged. Musculoskeletal: Diffuse smudgy sclerosis within the vertebral bodies. CT ABDOMEN AND PELVIS FINDINGS Hepatobiliary: Cluster low-density lesions in the posterior RIGHT hepatic lobe not changed. Several small lesions in the more central RIGHT hepatic lobe are more conspicuous than prior including 13 mm lesion on image 57/2. Several small superior nodules are similar. The liver has a nodular contour New ascites throughout the abdomen and pelvis.  Gallbladder normal Pancreas: Pancreas normal Spleen: Normal spleen  Adrenals/urinary tract: Adrenal glands normal. No worrisome hepatic lesion. Fat containing lesion in the LEFT kidney consistent with angiomyolipoma. Ureters and bladder normal Stomach/Bowel: Stomach, small bowel, appendix, and cecum are normal. The colon and rectosigmoid colon are normal. Vascular/Lymphatic: Abdominal aorta is normal caliber with atherosclerotic calcification. There is no retroperitoneal or periportal lymphadenopathy. No pelvic lymphadenopathy. Reproductive: Prostate normal Other: Large volume intraperitoneal free fluid. Musculoskeletal: Diffuse smudgy sclerosis of the bones concerning for widespread skeletal metastasis. Increased sclerosis in the lumbar spine compared to comparison CT 11/08/2017. IMPRESSION: Chest Impression: 1. Linear thickening in the RIGHT upper lobe at site of prior nodular lesion. 2. Mild mediastinal adenopathy similar to most recent CT scan of 11/26/2017 and improved from PET-CT scan of 10/21/2017 Abdomen / Pelvis Impression: 1. Nodule liver consistent with cirrhosis. New extensive ASCITES in the abdomen pelvis. 2. Multiple low-density lesions in the posterior RIGHT hepatic lobe are unchanged. Several new lesions in the central liver concerning for metastatic deposits. Consider MRI with and without contrast for further evaluation. 3. Smudgy calcifications within the lumbar spine indeterminate but concerning for metastatic skeletal disease. Consider MRI of the lumbar spine (FDG PET CT not advised on patient with Neulasta support). Electronically Signed   By: Suzy Bouchard M.D.   On: 02/14/2018 15:13   Ct Abdomen Pelvis W Contrast  Result Date: 02/14/2018 CLINICAL DATA:  "extensive stage small cell lung cancer presented with right upper lobe lung mass in addition to pulmonary nodules, mediastinal lymphadenopathy and extensive liver metastasis in June 2019. EXAM: CT CHEST, ABDOMEN, AND PELVIS WITH CONTRAST TECHNIQUE: Multidetector CT imaging of the chest, abdomen and  pelvis was performed following the standard protocol during bolus administration of intravenous contrast. CONTRAST:  174mL OMNIPAQUE IOHEXOL 300 MG/ML  SOLN COMPARISON:  CT abdomen 11/08/2017, chest CT 11/26/2017, PET-CT 10/21/2017 FINDINGS: CT CHEST FINDINGS Cardiovascular: Coronary artery calcification and aortic atherosclerotic calcification. Mediastinum/Nodes: No axillary or supraclavicular adenopathy. RIGHT lower paratracheal lymph node measuring 12 mm compares to 22 mm on comparison PET-CT scan. No new adenopathy. Lungs/Pleura: Linear thickening in the RIGHT upper lobe at site of prior nodule. No new nodularity Two nodules along the lower RIGHT horizontal fissure (image 30/2) unchanged. Musculoskeletal: Diffuse smudgy sclerosis within the vertebral bodies. CT ABDOMEN AND PELVIS FINDINGS Hepatobiliary: Cluster low-density lesions in the posterior RIGHT hepatic lobe not changed. Several small lesions in the more central RIGHT hepatic lobe are more conspicuous than prior including 13 mm lesion on image 57/2. Several small superior nodules are similar. The liver has a nodular contour New ascites throughout the abdomen and pelvis.  Gallbladder normal Pancreas: Pancreas normal Spleen: Normal spleen Adrenals/urinary tract: Adrenal glands normal. No worrisome hepatic lesion. Fat containing lesion in the LEFT kidney consistent with angiomyolipoma.  Ureters and bladder normal Stomach/Bowel: Stomach, small bowel, appendix, and cecum are normal. The colon and rectosigmoid colon are normal. Vascular/Lymphatic: Abdominal aorta is normal caliber with atherosclerotic calcification. There is no retroperitoneal or periportal lymphadenopathy. No pelvic lymphadenopathy. Reproductive: Prostate normal Other: Large volume intraperitoneal free fluid. Musculoskeletal: Diffuse smudgy sclerosis of the bones concerning for widespread skeletal metastasis. Increased sclerosis in the lumbar spine compared to comparison CT 11/08/2017.  IMPRESSION: Chest Impression: 1. Linear thickening in the RIGHT upper lobe at site of prior nodular lesion. 2. Mild mediastinal adenopathy similar to most recent CT scan of 11/26/2017 and improved from PET-CT scan of 10/21/2017 Abdomen / Pelvis Impression: 1. Nodule liver consistent with cirrhosis. New extensive ASCITES in the abdomen pelvis. 2. Multiple low-density lesions in the posterior RIGHT hepatic lobe are unchanged. Several new lesions in the central liver concerning for metastatic deposits. Consider MRI with and without contrast for further evaluation. 3. Smudgy calcifications within the lumbar spine indeterminate but concerning for metastatic skeletal disease. Consider MRI of the lumbar spine (FDG PET CT not advised on patient with Neulasta support). Electronically Signed   By: Suzy Bouchard M.D.   On: 02/14/2018 15:13    ASSESSMENT AND PLAN: This is a very pleasant 72 years old white male diagnosed with extensive stage small cell lung cancer presented with right upper lobe lung mass in addition to pulmonary nodules, mediastinal lymphadenopathy and extensive liver metastasis in June 2019. The patient is currently undergoing systemic chemotherapy with carboplatin and etoposide status post 5 cycles. The patient has a rough time with his chemotherapy with significant pancytopenia.  He missed his Neulasta injection after cycle #5.  He was admitted recently to [facility] with neutropenic fever as well as pancytopenia. He had repeat CT scan of the chest, abdomen and pelvis performed recently.  I personally and independently reviewed the scans and discussed the results with the patient and his family.  His a scan showed stable disease in the chest was no significant change in the liver except for few central liver lesion that could be concerning for metastatic deposit but not completely confirmed. I discussed with the patient his current treatment options including continuous observation and  monitoring versus palliative care and hospice referral versus consideration of maintenance treatment with immunotherapy with Tecentriq as a single agent.  The patient cannot tolerate any further chemotherapy at this point. The patient his family would like to consider the maintenance treatment for now.  He is expected to start the first cycle of this treatment next week.  I discussed with him the adverse effect of this treatment including but not limited to immunotherapy mediated skin rash, diarrhea, inflammation of the lung, kidney, liver, thyroid or other endocrine dysfunction. For the abdominal distention and reaccumulation of ascites, I will arrange for the patient to undergo ultrasound-guided therapeutic paracentesis. I will see the patient back for follow-up visit in 4 weeks with the start of cycle #2 of his treatment with Tecentriq Huey [place]ienen[place]). The patient and his family understand that he has very poor prognosis and if he developed disease progression on Tecentriq we would probably consider palliative care at that time. For pain management, I gave him a refill of tramadol.  He was advised to call immediately if he has any concerning symptoms in the interval. The patient voices understanding of current disease status and treatment options and is in agreement with the current care plan. All questions were answered. The patient knows to call the clinic with any problems, questions  or concerns. We can certainly see the patient much sooner if necessary.  Disclaimer: This note was dictated with voice recognition software. Similar sounding words can inadvertently be transcribed and may not be corrected upon review.

## 2018-02-21 ENCOUNTER — Ambulatory Visit (HOSPITAL_COMMUNITY)
Admission: RE | Admit: 2018-02-21 | Discharge: 2018-02-21 | Disposition: A | Discharge status: Home / Self Care | Payer: BLUE CROSS/BLUE SHIELD | Source: Ambulatory Visit | Attending: Internal Medicine | Admitting: Internal Medicine

## 2018-02-21 DIAGNOSIS — R188 Other ascites: Secondary | ICD-10-CM | POA: Insufficient documentation

## 2018-02-21 DIAGNOSIS — B9789 Other viral agents as the cause of diseases classified elsewhere: Secondary | ICD-10-CM | POA: Diagnosis not present

## 2018-02-21 DIAGNOSIS — C3490 Malignant neoplasm of unspecified part of unspecified bronchus or lung: Secondary | ICD-10-CM | POA: Diagnosis present

## 2018-02-21 DIAGNOSIS — K658 Other peritonitis: Secondary | ICD-10-CM | POA: Diagnosis not present

## 2018-02-21 MED ORDER — LIDOCAINE HCL 1 % IJ SOLN
INTRAMUSCULAR | Status: AC
  Filled 2018-02-21: qty 10

## 2018-02-21 NOTE — Procedures (Signed)
Ultrasound-guided diagnostic and therapeutic paracentesis performed yielding 11.4 liters of yellow fluid. No immediate complications. A portion of the fluid was sent to the lab for cytology.

## 2018-02-24 ENCOUNTER — Inpatient Hospital Stay: Payer: BLUE CROSS/BLUE SHIELD

## 2018-02-24 DIAGNOSIS — N179 Acute kidney failure, unspecified: Secondary | ICD-10-CM | POA: Diagnosis not present

## 2018-02-24 DIAGNOSIS — R5382 Chronic fatigue, unspecified: Secondary | ICD-10-CM

## 2018-02-24 DIAGNOSIS — C3490 Malignant neoplasm of unspecified part of unspecified bronchus or lung: Secondary | ICD-10-CM

## 2018-02-24 LAB — CBC WITH DIFFERENTIAL (CANCER CENTER ONLY)
ABS IMMATURE GRANULOCYTES: 0.11 10*3/uL — AB (ref 0.00–0.07)
Basophils Absolute: 0 10*3/uL (ref 0.0–0.1)
Basophils Relative: 0 %
Eosinophils Absolute: 0.1 10*3/uL (ref 0.0–0.5)
Eosinophils Relative: 1 %
HCT: 32.5 % — ABNORMAL LOW (ref 39.0–52.0)
Hemoglobin: 10.8 g/dL — ABNORMAL LOW (ref 13.0–17.0)
IMMATURE GRANULOCYTES: 1 %
LYMPHS PCT: 11 %
Lymphs Abs: 1.1 10*3/uL (ref 0.7–4.0)
MCH: 34.7 pg — AB (ref 26.0–34.0)
MCHC: 33.2 g/dL (ref 30.0–36.0)
MCV: 104.5 fL — ABNORMAL HIGH (ref 80.0–100.0)
MONO ABS: 0.9 10*3/uL (ref 0.1–1.0)
Monocytes Relative: 9 %
NEUTROS ABS: 7.7 10*3/uL (ref 1.7–7.7)
Neutrophils Relative %: 78 %
Platelet Count: 55 10*3/uL — ABNORMAL LOW (ref 150–400)
RBC: 3.11 MIL/uL — AB (ref 4.22–5.81)
RDW: 17.8 % — ABNORMAL HIGH (ref 11.5–15.5)
WBC: 9.9 10*3/uL (ref 4.0–10.5)
nRBC: 0 % (ref 0.0–0.2)

## 2018-02-24 LAB — CMP (CANCER CENTER ONLY)
ALT: 31 U/L (ref 0–44)
AST: 55 U/L — AB (ref 15–41)
Albumin: 2 g/dL — ABNORMAL LOW (ref 3.5–5.0)
Alkaline Phosphatase: 126 U/L (ref 38–126)
Anion gap: 13 (ref 5–15)
BUN: 39 mg/dL — ABNORMAL HIGH (ref 8–23)
CHLORIDE: 101 mmol/L (ref 98–111)
CO2: 20 mmol/L — AB (ref 22–32)
Calcium: 8.8 mg/dL — ABNORMAL LOW (ref 8.9–10.3)
Creatinine: 3.99 mg/dL (ref 0.61–1.24)
GFR, EST NON AFRICAN AMERICAN: 14 mL/min — AB (ref >60)
GFR, Est AFR Am: 16 mL/min — ABNORMAL LOW (ref >60)
Glucose, Bld: 152 mg/dL — ABNORMAL HIGH (ref 70–99)
POTASSIUM: 5.9 mmol/L — AB (ref 3.5–5.1)
SODIUM: 134 mmol/L — AB (ref 135–145)
Total Bilirubin: 2.5 mg/dL — ABNORMAL HIGH (ref 0.3–1.2)
Total Protein: 6.7 g/dL (ref 6.5–8.1)

## 2018-02-24 LAB — TSH: TSH: 4.151 u[IU]/mL — AB (ref 0.320–4.118)

## 2018-02-24 NOTE — Progress Notes (Signed)
Per [name], holding tx today for one week. Pt and daughter made aware to expect a call from scheduling.

## 2018-02-25 ENCOUNTER — Inpatient Hospital Stay (HOSPITAL_COMMUNITY): Payer: BLUE CROSS/BLUE SHIELD

## 2018-02-25 ENCOUNTER — Telehealth: Payer: Self-pay | Admitting: Medical Oncology

## 2018-02-25 ENCOUNTER — Inpatient Hospital Stay (HOSPITAL_COMMUNITY)
Admission: EM | Admit: 2018-02-25 | Discharge: 2018-03-10 | DRG: 673 | Disposition: A | Discharge status: Hospice | Payer: BLUE CROSS/BLUE SHIELD | Attending: Internal Medicine | Admitting: Internal Medicine

## 2018-02-25 ENCOUNTER — Encounter (HOSPITAL_COMMUNITY): Payer: Self-pay

## 2018-02-25 ENCOUNTER — Other Ambulatory Visit: Payer: Self-pay

## 2018-02-25 ENCOUNTER — Other Ambulatory Visit: Payer: Self-pay | Admitting: Medical Oncology

## 2018-02-25 ENCOUNTER — Emergency Department (HOSPITAL_COMMUNITY): Payer: BLUE CROSS/BLUE SHIELD

## 2018-02-25 DIAGNOSIS — C3411 Malignant neoplasm of upper lobe, right bronchus or lung: Secondary | ICD-10-CM | POA: Diagnosis present

## 2018-02-25 DIAGNOSIS — Z9221 Personal history of antineoplastic chemotherapy: Secondary | ICD-10-CM

## 2018-02-25 DIAGNOSIS — I110 Hypertensive heart disease with heart failure: Secondary | ICD-10-CM | POA: Diagnosis present

## 2018-02-25 DIAGNOSIS — E875 Hyperkalemia: Secondary | ICD-10-CM | POA: Diagnosis present

## 2018-02-25 DIAGNOSIS — C7951 Secondary malignant neoplasm of bone: Secondary | ICD-10-CM | POA: Diagnosis present

## 2018-02-25 DIAGNOSIS — T451X5A Adverse effect of antineoplastic and immunosuppressive drugs, initial encounter: Secondary | ICD-10-CM | POA: Diagnosis not present

## 2018-02-25 DIAGNOSIS — R188 Other ascites: Secondary | ICD-10-CM | POA: Diagnosis present

## 2018-02-25 DIAGNOSIS — D899 Disorder involving the immune mechanism, unspecified: Secondary | ICD-10-CM | POA: Diagnosis present

## 2018-02-25 DIAGNOSIS — M1990 Unspecified osteoarthritis, unspecified site: Secondary | ICD-10-CM | POA: Diagnosis present

## 2018-02-25 DIAGNOSIS — Z833 Family history of diabetes mellitus: Secondary | ICD-10-CM | POA: Diagnosis not present

## 2018-02-25 DIAGNOSIS — L89312 Pressure ulcer of right buttock, stage 2: Secondary | ICD-10-CM | POA: Diagnosis present

## 2018-02-25 DIAGNOSIS — E119 Type 2 diabetes mellitus without complications: Secondary | ICD-10-CM | POA: Diagnosis present

## 2018-02-25 DIAGNOSIS — R197 Diarrhea, unspecified: Secondary | ICD-10-CM | POA: Diagnosis present

## 2018-02-25 DIAGNOSIS — C787 Secondary malignant neoplasm of liver and intrahepatic bile duct: Secondary | ICD-10-CM | POA: Diagnosis present

## 2018-02-25 DIAGNOSIS — J189 Pneumonia, unspecified organism: Secondary | ICD-10-CM | POA: Diagnosis present

## 2018-02-25 DIAGNOSIS — L89322 Pressure ulcer of left buttock, stage 2: Secondary | ICD-10-CM | POA: Diagnosis present

## 2018-02-25 DIAGNOSIS — K7469 Other cirrhosis of liver: Secondary | ICD-10-CM | POA: Diagnosis not present

## 2018-02-25 DIAGNOSIS — C3490 Malignant neoplasm of unspecified part of unspecified bronchus or lung: Secondary | ICD-10-CM | POA: Diagnosis present

## 2018-02-25 DIAGNOSIS — D61818 Other pancytopenia: Secondary | ICD-10-CM | POA: Diagnosis present

## 2018-02-25 DIAGNOSIS — E43 Unspecified severe protein-calorie malnutrition: Secondary | ICD-10-CM | POA: Diagnosis present

## 2018-02-25 DIAGNOSIS — I10 Essential (primary) hypertension: Secondary | ICD-10-CM

## 2018-02-25 DIAGNOSIS — Y95 Nosocomial condition: Secondary | ICD-10-CM | POA: Diagnosis present

## 2018-02-25 DIAGNOSIS — Z888 Allergy status to other drugs, medicaments and biological substances status: Secondary | ICD-10-CM

## 2018-02-25 DIAGNOSIS — N179 Acute kidney failure, unspecified: Principal | ICD-10-CM | POA: Diagnosis present

## 2018-02-25 DIAGNOSIS — E883 Tumor lysis syndrome: Secondary | ICD-10-CM | POA: Diagnosis present

## 2018-02-25 DIAGNOSIS — R180 Malignant ascites: Secondary | ICD-10-CM

## 2018-02-25 DIAGNOSIS — K7200 Acute and subacute hepatic failure without coma: Secondary | ICD-10-CM | POA: Diagnosis not present

## 2018-02-25 DIAGNOSIS — Z79899 Other long term (current) drug therapy: Secondary | ICD-10-CM

## 2018-02-25 DIAGNOSIS — D6959 Other secondary thrombocytopenia: Secondary | ICD-10-CM | POA: Diagnosis present

## 2018-02-25 DIAGNOSIS — Z515 Encounter for palliative care: Secondary | ICD-10-CM | POA: Diagnosis present

## 2018-02-25 DIAGNOSIS — Z7189 Other specified counseling: Secondary | ICD-10-CM | POA: Diagnosis not present

## 2018-02-25 DIAGNOSIS — I5032 Chronic diastolic (congestive) heart failure: Secondary | ICD-10-CM | POA: Diagnosis present

## 2018-02-25 DIAGNOSIS — Z87891 Personal history of nicotine dependence: Secondary | ICD-10-CM

## 2018-02-25 DIAGNOSIS — Z6832 Body mass index (BMI) 32.0-32.9, adult: Secondary | ICD-10-CM

## 2018-02-25 DIAGNOSIS — G893 Neoplasm related pain (acute) (chronic): Secondary | ICD-10-CM | POA: Diagnosis present

## 2018-02-25 DIAGNOSIS — Z794 Long term (current) use of insulin: Secondary | ICD-10-CM

## 2018-02-25 LAB — CBC
HEMATOCRIT: 33.4 % — AB (ref 39.0–52.0)
Hemoglobin: 11 g/dL — ABNORMAL LOW (ref 13.0–17.0)
MCH: 34.6 pg — ABNORMAL HIGH (ref 26.0–34.0)
MCHC: 32.9 g/dL (ref 30.0–36.0)
MCV: 105 fL — ABNORMAL HIGH (ref 80.0–100.0)
NRBC: 0 % (ref 0.0–0.2)
Platelets: 51 10*3/uL — ABNORMAL LOW (ref 150–400)
RBC: 3.18 MIL/uL — AB (ref 4.22–5.81)
RDW: 17.6 % — ABNORMAL HIGH (ref 11.5–15.5)
WBC: 9.8 10*3/uL (ref 4.0–10.5)

## 2018-02-25 LAB — I-STAT TROPONIN, ED: Troponin i, poc: 0 ng/mL (ref 0.00–0.08)

## 2018-02-25 LAB — CBG MONITORING, ED: Glucose-Capillary: 146 mg/dL — ABNORMAL HIGH (ref 70–99)

## 2018-02-25 LAB — URINALYSIS, ROUTINE W REFLEX MICROSCOPIC
Bilirubin Urine: NEGATIVE
Glucose, UA: NEGATIVE mg/dL
Hgb urine dipstick: NEGATIVE
KETONES UR: NEGATIVE mg/dL
LEUKOCYTES UA: NEGATIVE
NITRITE: NEGATIVE
PH: 5 (ref 5.0–8.0)
Protein, ur: NEGATIVE mg/dL
Specific Gravity, Urine: 1.015 (ref 1.005–1.030)

## 2018-02-25 LAB — HEPATIC FUNCTION PANEL
ALBUMIN: 2 g/dL — AB (ref 3.5–5.0)
ALK PHOS: 115 U/L (ref 38–126)
ALT: 33 U/L (ref 0–44)
AST: 62 U/L — ABNORMAL HIGH (ref 15–41)
BILIRUBIN TOTAL: 2.7 mg/dL — AB (ref 0.3–1.2)
Bilirubin, Direct: 1.6 mg/dL — ABNORMAL HIGH (ref 0.0–0.2)
Indirect Bilirubin: 1.1 mg/dL — ABNORMAL HIGH (ref 0.3–0.9)
Total Protein: 6.2 g/dL — ABNORMAL LOW (ref 6.5–8.1)

## 2018-02-25 LAB — I-STAT CG4 LACTIC ACID, ED: LACTIC ACID, VENOUS: 3.42 mmol/L — AB (ref 0.5–1.9)

## 2018-02-25 LAB — BASIC METABOLIC PANEL
Anion gap: 13 (ref 5–15)
BUN: 49 mg/dL — ABNORMAL HIGH (ref 8–23)
CHLORIDE: 101 mmol/L (ref 98–111)
CO2: 19 mmol/L — ABNORMAL LOW (ref 22–32)
Calcium: 8.2 mg/dL — ABNORMAL LOW (ref 8.9–10.3)
Creatinine, Ser: 4.03 mg/dL — ABNORMAL HIGH (ref 0.61–1.24)
GFR calc non Af Amer: 14 mL/min — ABNORMAL LOW (ref >60)
GFR, EST AFRICAN AMERICAN: 16 mL/min — AB (ref >60)
Glucose, Bld: 146 mg/dL — ABNORMAL HIGH (ref 70–99)
Potassium: 6 mmol/L — ABNORMAL HIGH (ref 3.5–5.1)
SODIUM: 133 mmol/L — AB (ref 135–145)

## 2018-02-25 LAB — PROTIME-INR
INR: 1.25
PROTHROMBIN TIME: 15.6 s — AB (ref 11.4–15.2)

## 2018-02-25 LAB — GLUCOSE, CAPILLARY: Glucose-Capillary: 153 mg/dL — ABNORMAL HIGH (ref 70–99)

## 2018-02-25 LAB — BRAIN NATRIURETIC PEPTIDE: B Natriuretic Peptide: 63 pg/mL (ref 0.0–100.0)

## 2018-02-25 LAB — LIPASE, BLOOD: LIPASE: 41 U/L (ref 11–51)

## 2018-02-25 MED ORDER — VANCOMYCIN VARIABLE DOSE PER UNSTABLE RENAL FUNCTION (PHARMACIST DOSING): Status: DC

## 2018-02-25 MED ORDER — OMEGA-3 FATTY ACIDS 1000 MG PO CAPS
1.0000 g | ORAL_CAPSULE | Freq: Two times a day (BID) | ORAL | Status: DC
  Filled 2018-02-25: qty 1

## 2018-02-25 MED ORDER — ALBUMIN HUMAN 25 % IV SOLN
25.0000 g | Freq: Four times a day (QID) | INTRAVENOUS | Status: AC
  Administered 2018-02-26 (×5): 25 g via INTRAVENOUS
  Filled 2018-02-25 (×7): qty 100

## 2018-02-25 MED ORDER — ONDANSETRON HCL 4 MG PO TABS
4.0000 mg | ORAL_TABLET | Freq: Four times a day (QID) | ORAL | Status: DC | PRN
  Administered 2018-02-28: 4 mg via ORAL
  Filled 2018-02-25: qty 1

## 2018-02-25 MED ORDER — SODIUM CHLORIDE 0.9 % IV SOLN
2.0000 g | Freq: Once | INTRAVENOUS | Status: AC
  Administered 2018-02-25: 2 g via INTRAVENOUS
  Filled 2018-02-25: qty 2

## 2018-02-25 MED ORDER — HYDROCODONE-ACETAMINOPHEN 7.5-325 MG PO TABS
1.0000 | ORAL_TABLET | Freq: Four times a day (QID) | ORAL | Status: DC | PRN
  Administered 2018-02-26 – 2018-02-28 (×5): 1 via ORAL
  Filled 2018-02-25 (×5): qty 1

## 2018-02-25 MED ORDER — SODIUM CHLORIDE 0.9 % IV SOLN
1.0000 g | Freq: Three times a day (TID) | INTRAVENOUS | Status: DC
  Administered 2018-02-26 – 2018-02-28 (×7): 1 g via INTRAVENOUS
  Filled 2018-02-25 (×8): qty 1

## 2018-02-25 MED ORDER — CARVEDILOL 3.125 MG PO TABS
3.1250 mg | ORAL_TABLET | Freq: Two times a day (BID) | ORAL | Status: DC
  Administered 2018-02-26 – 2018-03-01 (×7): 3.125 mg via ORAL
  Filled 2018-02-25 (×7): qty 1

## 2018-02-25 MED ORDER — VITAMIN B-12 100 MCG PO TABS
100.0000 ug | ORAL_TABLET | Freq: Every day | ORAL | Status: DC
  Administered 2018-02-26 – 2018-03-09 (×12): 100 ug via ORAL
  Filled 2018-02-25 (×14): qty 1

## 2018-02-25 MED ORDER — SODIUM CHLORIDE 0.9 % IV SOLN
INTRAVENOUS | Status: DC
  Administered 2018-02-25: via INTRAVENOUS

## 2018-02-25 MED ORDER — OMEGA-3-ACID ETHYL ESTERS 1 G PO CAPS
1.0000 g | ORAL_CAPSULE | Freq: Every day | ORAL | Status: DC
  Administered 2018-02-25 – 2018-03-09 (×13): 1 g via ORAL
  Filled 2018-02-25 (×12): qty 1

## 2018-02-25 MED ORDER — VANCOMYCIN HCL 10 G IV SOLR
2000.0000 mg | Freq: Once | INTRAVENOUS | Status: AC
  Administered 2018-02-25: 2000 mg via INTRAVENOUS
  Filled 2018-02-25: qty 2000

## 2018-02-25 MED ORDER — ACETAMINOPHEN 650 MG RE SUPP: 650.0000 mg | Freq: Four times a day (QID) | RECTAL | Status: DC | PRN

## 2018-02-25 MED ORDER — PATIROMER SORBITEX CALCIUM 8.4 G PO PACK
8.4000 g | PACK | Freq: Once | ORAL | Status: AC
  Administered 2018-02-25: 8.4 g via ORAL
  Filled 2018-02-25: qty 1

## 2018-02-25 MED ORDER — HYDROCODONE-ACETAMINOPHEN 7.5-325 MG PO TABS: 1.0000 | ORAL_TABLET | Freq: Four times a day (QID) | ORAL | 0 refills | Status: DC | PRN

## 2018-02-25 MED ORDER — PANTOPRAZOLE SODIUM 40 MG PO TBEC
40.0000 mg | DELAYED_RELEASE_TABLET | Freq: Every day | ORAL | Status: DC
  Administered 2018-02-26 – 2018-03-09 (×12): 40 mg via ORAL
  Filled 2018-02-25 (×13): qty 1

## 2018-02-25 MED ORDER — ACETAMINOPHEN 325 MG PO TABS
650.0000 mg | ORAL_TABLET | Freq: Four times a day (QID) | ORAL | Status: DC | PRN
  Administered 2018-02-28 – 2018-03-02 (×4): 650 mg via ORAL
  Filled 2018-02-25 (×4): qty 2

## 2018-02-25 MED ORDER — SODIUM CHLORIDE 0.9 % IV BOLUS
500.0000 mL | Freq: Once | INTRAVENOUS | Status: AC
  Administered 2018-02-25: 500 mL via INTRAVENOUS

## 2018-02-25 MED ORDER — INSULIN ASPART 100 UNIT/ML ~~LOC~~ SOLN
0.0000 [IU] | Freq: Three times a day (TID) | SUBCUTANEOUS | Status: DC
  Administered 2018-02-26 (×2): 2 [IU] via SUBCUTANEOUS
  Administered 2018-02-26: 1 [IU] via SUBCUTANEOUS
  Administered 2018-02-27: 2 [IU] via SUBCUTANEOUS
  Administered 2018-02-27 – 2018-02-28 (×2): 1 [IU] via SUBCUTANEOUS
  Administered 2018-02-28 (×2): 3 [IU] via SUBCUTANEOUS
  Administered 2018-03-01 (×2): 1 [IU] via SUBCUTANEOUS
  Administered 2018-03-02: 2 [IU] via SUBCUTANEOUS
  Administered 2018-03-02 – 2018-03-03 (×2): 1 [IU] via SUBCUTANEOUS
  Administered 2018-03-03: 3 [IU] via SUBCUTANEOUS
  Administered 2018-03-03: 2 [IU] via SUBCUTANEOUS
  Administered 2018-03-04: 1 [IU] via SUBCUTANEOUS
  Administered 2018-03-04: 5 [IU] via SUBCUTANEOUS
  Administered 2018-03-04 – 2018-03-08 (×11): 2 [IU] via SUBCUTANEOUS
  Administered 2018-03-08: 3 [IU] via SUBCUTANEOUS
  Administered 2018-03-08 – 2018-03-10 (×5): 2 [IU] via SUBCUTANEOUS
  Administered 2018-03-10: 3 [IU] via SUBCUTANEOUS

## 2018-02-25 MED ORDER — SODIUM CHLORIDE 0.9 % IV BOLUS: 500.0000 mL | Freq: Once | INTRAVENOUS | Status: AC

## 2018-02-25 MED ORDER — ONDANSETRON HCL 4 MG/2ML IJ SOLN
4.0000 mg | Freq: Four times a day (QID) | INTRAMUSCULAR | Status: DC | PRN
  Administered 2018-02-26 – 2018-03-08 (×11): 4 mg via INTRAVENOUS
  Filled 2018-02-25 (×12): qty 2

## 2018-02-25 NOTE — ED Notes (Signed)
1 blood culture obtained.  Per prescribing MD, start antibiotics with only one blood culture. Antibiotic infusing at this time.

## 2018-02-25 NOTE — ED Notes (Signed)
Bed: OX73 Expected date:  Expected time:  Means of arrival:  Comments: EMS- 72yo M, [place] Pt, weakness

## 2018-02-25 NOTE — Progress Notes (Signed)
A consult was received from an ED physician for vancomycin per pharmacy dosing.  The patient's profile has been reviewed for ht/wt/allergies/indication/available labs.    A one time order has been placed for vancomycin 2000 mg IV once.  Further antibiotics/pharmacy consults should be ordered by admitting physician if indicated.                       Thank you, Lenis Noon, PharmD 02/25/2018  7:42 PM

## 2018-02-25 NOTE — ED Notes (Signed)
Writer only able to obtain one set of blood cultures.  RN notified.

## 2018-02-25 NOTE — ED Triage Notes (Addendum)
Patient arrived via [facility] from home. Patient c/o Generalized weakness and not being about to move around or get into chair X2 days. Patient c/o back pain from being uncomfortable.  -A/Ox4 -Denies n/v & diarrhea -Family states patient has been eating good  -Stage 4 lung cancer spread to liver (Per family) -Been on chemo but, not able to get last dose due to weakness. -Fluid taken off abdomen last Friday (11.4 Liters) -Soreness in abdomen and tight  BP-106/55 HR-70 RR-16 97% RA CBG-151  Allergies- Amoxcillin.

## 2018-02-25 NOTE — ED Provider Notes (Signed)
[facility] DEPT Provider Note   CSN: [id number] Arrival date & time: 02/25/18  1642     History   Chief Complaint Chief Complaint  Patient presents with  . Weakness    HPI Morley Gaumer is a 72 y.o. male.  The history is provided by the patient and medical records. No language interpreter was used.  Illness  This is a new problem. The current episode started more than 2 days ago. The problem occurs constantly. The problem has been gradually worsening. Associated symptoms include abdominal pain and shortness of breath. Pertinent negatives include no chest pain and no headaches. Nothing aggravates the symptoms. Nothing relieves the symptoms. He has tried nothing for the symptoms. The treatment provided no relief.    Past Medical History:  Diagnosis Date  . Arthritis   . Cancer ([place])   . Diabetes mellitus without complication ([place])   . Hypertension     Patient Active Problem List   Diagnosis Date Noted  . Anasarca 01/11/2018  . Chronic diastolic CHF (congestive heart failure) ([place]) 01/11/2018  . Abdominal pain 01/11/2018  . Elevated lactic acid level 01/11/2018  . Malnutrition of moderate degree 12/20/2017  . SBP (spontaneous bacterial peritonitis) ([place])   . Abdominal distention   . Hypervolemia   . Edema 12/13/2017  . Severe protein-calorie malnutrition ([place]) 12/13/2017  . Abnormal liver function 12/13/2017  . Leukocytosis 12/13/2017  . Acute on chronic diastolic (congestive) heart failure ([place]) 11/27/2017  . Tachycardia 11/26/2017  . Anemia 11/26/2017  . Leg edema, right 11/26/2017  . Dehydration   . Pressure injury of skin 11/09/2017  . Hyperbilirubinemia   . Liver metastasis ([place])   . Neutropenic fever ([place])   . Pancytopenia ([place])   . Febrile neutropenia ([place]) 11/08/2017  . Severe sepsis ([place]) 11/08/2017  . Antineoplastic chemotherapy induced pancytopenia ([place]) 11/08/2017  . AKI (acute kidney injury) ([place]) 11/08/2017  .  Encounter for antineoplastic chemotherapy   . Extensive stage primary small cell carcinoma of lung ([place]) 10/03/2017  . Liver masses   . Mass of upper lobe of right lung 09/28/2017  . Hyponatremia 09/27/2017  . Failure to thrive in adult 09/27/2017  . Metastatic cancer to liver ([place]) 09/27/2017  . Diabetes mellitus without complication ([place]) 08/65/7846  . Essential hypertension 09/27/2017    Past Surgical History:  Procedure Laterality Date  . HERNIA REPAIR    . JOINT REPLACEMENT Right    knee  . LIVER BIOPSY          Home Medications    Prior to Admission medications   Medication Sig Start Date End Date Taking? Authorizing Provider  carvedilol (COREG) 3.125 MG tablet Take 1 tablet (3.125 mg total) by mouth 2 (two) times daily with a meal. 12/24/17   Mariel Aloe, MD  CINNAMON PO Take 600 mg by mouth 2 (two) times daily.    [provider]  Cyanocobalamin (VITAMIN B-12 PO) Take 1 tablet by mouth daily.    [provider]  doxycycline (VIBRA-TABS) 100 MG tablet Take 1 tablet (100 mg total) by mouth 2 (two) times daily. 01/20/18   Curt Bears, MD  fish oil-omega-3 fatty acids 1000 MG capsule Take 1 g by mouth 2 (two) times daily.    [provider]  fluticasone (FLONASE) 50 MCG/ACT nasal spray Place 2 sprays into both nostrils daily as needed for allergies. 11/12/17   Eugenie Filler, MD  furosemide (LASIX) 20 MG tablet Take 3 tablets (60 mg total) by  mouth 2 (two) times daily. 01/18/18   Oswald Hillock, MD  HYDROcodone-acetaminophen (NORCO) 7.5-325 MG tablet Take 1 tablet by mouth every 6 (six) hours as needed for moderate pain. 02/25/18   Gus Height, MD  insulin aspart (NOVOLOG) 100 UNIT/ML injection CBG < 70: implement hypoglycemia protocol CBG 70 - 120: 0 units CBG 121 - 150: 2 units CBG 151 - 200: 3 units CBG 201 - 250: 5 units CBG 251 - 300: 8 units CBG 301 - 350: 11 units CBG 351 - 400: 15 units CBG > 400: call MD 10/08/17   Lavina Hamman, MD    insulin glargine (LANTUS) 100 UNIT/ML injection Inject 0.1 mLs (10 Units total) into the skin at bedtime. 01/18/18   Oswald Hillock, MD  loratadine (CLARITIN) 10 MG tablet Take 1 tablet (10 mg total) by mouth daily. 11/13/17   Eugenie Filler, MD  metFORMIN (GLUCOPHAGE) 1000 MG tablet Take 1,000 mg by mouth 2 (two) times daily with a meal.    [provider]  nicotine (NICODERM CQ - DOSED IN MG/24 HOURS) 21 mg/24hr patch Place 1 patch (21 mg total) onto the skin daily. Patient not taking: Reported on 01/10/2018 11/12/17   Eugenie Filler, MD  omeprazole (PRILOSEC) 20 MG capsule Take 20 mg by mouth daily.    [provider]  polyethylene glycol (MIRALAX / GLYCOLAX) packet Take 17 g by mouth daily. Patient taking differently: Take 17 g by mouth daily as needed for moderate constipation.  10/09/17   Lavina Hamman, MD  Potassium Chloride ER 20 MEQ TBCR Take 20 mEq by mouth 2 (two) times daily. 12/02/17   Dessa Phi, DO  Red Yeast Rice 600 MG TABS Take 1 tablet by mouth 2 (two) times daily.     [provider]  senna-docusate (SENOKOT-S) 8.6-50 MG tablet Take 1 tablet by mouth at bedtime as needed for mild constipation. 01/18/18   Oswald Hillock, MD  traMADol (ULTRAM) 50 MG tablet Take 1 tablet (50 mg total) by mouth every 6 (six) hours as needed for moderate pain. 02/17/18   Curt Bears, MD    Family History Family History  Problem Relation Age of Onset  . Diabetes Mother   . Dementia Mother   . Colon cancer Father   . CAD Neg Hx     Social History Social History   Tobacco Use  . Smoking status: Former Smoker    Types: Cigarettes    Last attempt to quit: 09/20/2017    Years since quitting: 0.4  . Smokeless tobacco: Never Used  Substance Use Topics  . Alcohol use: No  . Drug use: No     Allergies   Amoxicillin-pot clavulanate and Nitrofurantoin   Review of Systems Review of Systems  Constitutional: Positive for chills and fatigue. Negative for  diaphoresis and fever.  HENT: Negative for congestion.   Respiratory: Positive for cough and shortness of breath. Negative for chest tightness and wheezing.   Cardiovascular: Negative for chest pain, palpitations and leg swelling.  Gastrointestinal: Positive for abdominal distention, abdominal pain and nausea. Negative for constipation, diarrhea and vomiting.  Genitourinary: Negative for flank pain.  Musculoskeletal: Negative for back pain, neck pain and neck stiffness.  Neurological: Positive for light-headedness. Negative for headaches.  Psychiatric/Behavioral: Negative for agitation.  All other systems reviewed and are negative.    Physical Exam Updated Vital Signs BP (!) 111/57 (BP Location: Left Arm)   Pulse 77   Temp 97.6 F (36.4  C) (Oral)   Resp 17   Ht 6\' 3"  (1.905 m)   Wt 118.8 kg   SpO2 97%   BMI 32.75 kg/m   Physical Exam  Constitutional: He is oriented to person, place, and time. He appears well-developed and well-nourished. No distress.  HENT:  Head: Normocephalic and atraumatic.  Mouth/Throat: Oropharynx is clear and moist. No oropharyngeal exudate.  Eyes: Pupils are equal, round, and reactive to light. Conjunctivae and EOM are normal.  Neck: Neck supple.  Cardiovascular: Normal rate and regular rhythm.  No murmur heard. Pulmonary/Chest: Effort normal. No stridor. Tachypnea noted. No respiratory distress. He has no wheezes. He has rhonchi. He has rales. He exhibits no tenderness.  Abdominal: Soft. He exhibits distension. There is tenderness.  Musculoskeletal: He exhibits no edema or tenderness.  Neurological: He is alert and oriented to person, place, and time. No sensory deficit. He exhibits normal muscle tone.  Skin: Skin is warm and dry. Capillary refill takes less than 2 seconds. He is not diaphoretic. No erythema. No pallor.  Psychiatric: He has a normal mood and affect.  Nursing note and vitals reviewed.    ED Treatments / Results  Labs (all labs  ordered are listed, but only abnormal results are displayed) Labs Reviewed  BASIC METABOLIC PANEL - Abnormal; Notable for the following components:      Result Value   Sodium 133 (*)    Potassium 6.0 (*)    CO2 19 (*)    Glucose, Bld 146 (*)    BUN 49 (*)    Creatinine, Ser 4.03 (*)    Calcium 8.2 (*)    GFR calc non Af Amer 14 (*)    GFR calc Af Amer 16 (*)    All other components within normal limits  CBC - Abnormal; Notable for the following components:   RBC 3.18 (*)    Hemoglobin 11.0 (*)    HCT 33.4 (*)    MCV 105.0 (*)    MCH 34.6 (*)    RDW 17.6 (*)    Platelets 51 (*)    All other components within normal limits  URINALYSIS, ROUTINE W REFLEX MICROSCOPIC - Abnormal; Notable for the following components:   Color, Urine AMBER (*)    All other components within normal limits  AMMONIA - Abnormal; Notable for the following components:   Ammonia 55 (*)    All other components within normal limits  HEPATIC FUNCTION PANEL - Abnormal; Notable for the following components:   Total Protein 6.2 (*)    Albumin 2.0 (*)    AST 62 (*)    Total Bilirubin 2.7 (*)    Bilirubin, Direct 1.6 (*)    Indirect Bilirubin 1.1 (*)    All other components within normal limits  PROTIME-INR - Abnormal; Notable for the following components:   Prothrombin Time 15.6 (*)    All other components within normal limits  GLUCOSE, CAPILLARY - Abnormal; Notable for the following components:   Glucose-Capillary 153 (*)    All other components within normal limits  CBG MONITORING, ED - Abnormal; Notable for the following components:   Glucose-Capillary 146 (*)    All other components within normal limits  I-STAT CG4 LACTIC ACID, ED - Abnormal; Notable for the following components:   Lactic Acid, Venous 3.42 (*)    All other components within normal limits  URINE CULTURE  CULTURE, BLOOD (ROUTINE X 2)  CULTURE, BLOOD (ROUTINE X 2)  MRSA PCR SCREENING  BRAIN NATRIURETIC PEPTIDE  LIPASE, BLOOD  BASIC  METABOLIC PANEL  CBC  I-STAT TROPONIN, ED    EKG None  Radiology Ct Abdomen Pelvis Wo Contrast  Result Date: 02/25/2018 CLINICAL DATA:  Generalized weakness. Back pain. Stage IV lung cancer, spread the liver. Receiving chemotherapy. EXAM: CT ABDOMEN AND PELVIS WITHOUT CONTRAST TECHNIQUE: Multidetector CT imaging of the abdomen and pelvis was performed following the standard protocol without IV contrast. COMPARISON:  February 14, 2018 FINDINGS: Lower chest: No suspicious findings in the lung bases. Hepatobiliary: The patient has known liver lesions are not as well seen without contrast. The largest is in the right hepatic lobe on series 8, image 27. Mildly nodular contour to the liver suggesting cirrhosis. The gallbladder is unremarkable. Pancreas: Unremarkable. No pancreatic ductal dilatation or surrounding inflammatory changes. Spleen: Normal in size without focal abnormality. Adrenals/Urinary Tract: Adrenal glands are normal. There is a cyst in the upper pole the left kidney, unchanged. The kidneys, ureters, and bladder are otherwise normal. Stomach/Bowel: The stomach and small bowel are normal. The colon is unremarkable. The appendix is not visualized but there is no secondary evidence of appendicitis. Vascular/Lymphatic: Atherosclerotic changes are seen in the nonaneurysmal aorta. No adenopathy. Reproductive: Prostate is unremarkable. Other: Marked ascites in the abdomen and pelvis is similar in the interval. No free air. Musculoskeletal: Sclerotic lesion in L2 on series 6, image 69, stable in the interval. Mild sclerosis along the superior endplate of L1 on image 69 as well. Sclerotic focus in T9 on image 69 also. IMPRESSION: 1. Diffuse ascites, unchanged. 2. Cirrhosis. 3. Known lesions in the liver are more difficult to evaluate without contrast but not definitely changed. 4. Scattered sclerotic lesions in the lumbar and thoracic spine are nonspecific but may be metastatic given history of lung  cancer. 5. Atherosclerotic changes in the nonaneurysmal aorta. Electronically Signed   By: Dorise Bullion III M.D   On: 02/25/2018 21:29   Dg Chest 2 View  Result Date: 02/25/2018 CLINICAL DATA:  Productive cough with fatigue and abdominal distention EXAM: CHEST - 2 VIEW COMPARISON:  02/14/2018 abdominal CT FINDINGS: Hazy opacity in the right mid lung not seen on chest CT 11/26/2017. Normal heart size and mediastinal contours. No effusion or pneumothorax. Low lung volumes. IMPRESSION: Summation shadows versus subtle opacity in the right mid lung. This could be a focus of infection or metastatic disease. Electronically Signed   By: Monte Fantasia M.D.   On: 02/25/2018 19:17    Procedures Procedures (including critical care time)  CRITICAL CARE Performed by: Gwenyth Allegra Denaisha Swango Total critical care time: 35 minutes Critical care time was exclusive of separately billable procedures and treating other patients. Critical care was necessary to treat or prevent imminent or life-threatening deterioration. Critical care was time spent personally by me on the following activities: development of treatment plan with patient and/or surrogate as well as nursing, discussions with consultants, evaluation of patient's response to treatment, examination of patient, obtaining history from patient or surrogate, ordering and performing treatments and interventions, ordering and review of laboratory studies, ordering and review of radiographic studies, pulse oximetry and re-evaluation of patient's condition.   Medications Ordered in ED Medications  sodium chloride 0.9 % bolus 500 mL (has no administration in time range)  HYDROcodone-acetaminophen (NORCO) 7.5-325 MG per tablet 1 tablet (has no administration in time range)  carvedilol (COREG) tablet 3.125 mg (has no administration in time range)  pantoprazole (PROTONIX) EC tablet 40 mg (has no administration in time range)  vitamin B-12 (CYANOCOBALAMIN) tablet  100 mcg (has no administration in time range)  acetaminophen (TYLENOL) tablet 650 mg (has no administration in time range)    Or  acetaminophen (TYLENOL) suppository 650 mg (has no administration in time range)  ondansetron (ZOFRAN) tablet 4 mg (has no administration in time range)    Or  ondansetron (ZOFRAN) injection 4 mg (has no administration in time range)  insulin aspart (novoLOG) injection 0-9 Units (has no administration in time range)  0.9 %  sodium chloride infusion ( Intravenous New Bag/Given 02/25/18 2357)  albumin human 25 % solution 25 g (25 g Intravenous New Bag/Given 02/26/18 0031)  vancomycin variable dose per unstable renal function (pharmacist dosing) (has no administration in time range)  aztreonam (AZACTAM) 1 g in sodium chloride 0.9 % 100 mL IVPB (has no administration in time range)  omega-3 acid ethyl esters (LOVAZA) capsule 1 g (1 g Oral Given 02/25/18 2310)  sodium chloride 0.9 % bolus 500 mL (0 mLs Intravenous Stopped 02/25/18 2058)  aztreonam (AZACTAM) 2 g in sodium chloride 0.9 % 100 mL IVPB ( Intravenous Stopped 02/25/18 2058)  vancomycin (VANCOCIN) 2,000 mg in sodium chloride 0.9 % 500 mL IVPB (2,000 mg Intravenous New Bag/Given 02/25/18 2149)  patiromer Daryll Drown) packet 8.4 g (8.4 g Oral Given 02/25/18 2305)     Initial Impression / Assessment and Plan / ED Course  I have reviewed the triage vital signs and the nursing notes.  Pertinent labs & imaging results that were available during my care of the patient were reviewed by me and considered in my medical decision making (see chart for details).     Robert Compton is a 72 y.o. male with past medical history significant for lung cancer with metastasis to liver, pancytopenia, recent paracentesis of over 11 L 5 days ago, CHF, diabetes, and hypertension who presents from home at the direction of his occupational therapist for inability to ambulate, chills, change in urine appearance, cough, abdominal  distention, and fatigue.  Patient is brought in by his family who says that 5 days ago he had 11 L taken off his abdomen from paracentesis of ascites.  They report that it is likely the cancer causing this.  He reports that despite having the fluid taken off, he is feeling dehydrated.  He says that his mouth is dry and he feels fatigued and lightheaded.  He reports that he has had darkened urine from baseline.  He also reports he is having a brown production and cough.  He denies any chest pain but does report some shortness of breath.  He says that his upper abdomen is painful and moderate in discomfort.  He says he has had no new constipation or diarrhea.  He reports that he was supposed to have immunotherapy yesterday but due to low platelets they decided to postpone this.  He reports he has not been eating or drinking.  He has been feeling very tired and fatigue.  Next  Family reports that occupational therapy came out today and over the last 2 days patient has lost ability to ambulate.  When OT saw him they sent him to the ED because they say he is not safe to be at home without ambulation.  On exam, abdomen is distended.  Minimal tenderness.  Lungs have some coarseness and faint crackles.  No wheezing.  No significant murmur appreciated.  Legs have minimal edema.  Chest is nontender.  Clinically I am concerned that patient is dehydrated with all of his fluid going into his  abdomen from ascites.  Patient reports that he was found to have low blood pressures in the 90s in clinic yesterday and they were also concerned about dehydration at that time.  Patient was given a very small amount of fluids and will have work-up to look for a metabolic cause of his fatigue.  He also chest x-ray given his productive cough and shortness of breath.  He will have urinalysis to look for occult infection causing the fatigue and his urine change in appearance.  Given patient's inability to ambulate and the recommendation  by occupational therapy for admission, I anticipate patient will likely need to be admitted and may need repeat paracentesis due to the large amount of ascites and his abdominal discomfort today.  Patient's lactic acid elevated.  Patient's kidney function is elevated to 4.03 up from several days ago.  Potassium elevated at 6.0.  BNP not elevated, fluids ordered.  Patient's work-up revealed evidence of pneumonia.  Patient given antibiotics and will be admitted for further management.   Final Clinical Impressions(s) / ED Diagnoses   Final diagnoses:  AKI (acute kidney injury) ([place])  HCAP (healthcare-associated pneumonia)    Clinical Impression: 1. AKI (acute kidney injury) ([place])   2. HCAP (healthcare-associated pneumonia)     Disposition: Admit  This note was prepared with assistance of Dragon voice recognition software. Occasional wrong-word or sound-a-like substitutions may have occurred due to the inherent limitations of voice recognition software.     Nisha Dhami, Gwenyth Allegra, MD 02/26/18 [phone]

## 2018-02-25 NOTE — Telephone Encounter (Signed)
Spoke to [name] -they are taking pt to hospital via EMS . Notified Warehouse manager.

## 2018-02-25 NOTE — Telephone Encounter (Signed)
Pain, weakness and cannot walk. Per Robert Compton I told [name] that his disease is worse and he had a lot of fluid removed from adcomen pt needs to consider hospice for comfort measures. PEr [name] , she will talk to her brothers and pt and call me back.

## 2018-02-25 NOTE — Telephone Encounter (Signed)
Notified [name] that RN ED contacted. She is not sure pt is willing to go to ED. Pain med rx sent to triage.

## 2018-02-25 NOTE — H&P (Signed)
History and Physical    Robert Compton YTW:[id number] DOB: November 15, 1945 DOA: 02/25/2018  PCP: [name], MD  Patient coming from: Home.  Chief Complaint: Weakness.  HPI: Robert Compton is a 72 y.o. male with history of extensive stage small cell lung cancer started on immunotherapy, hypertension, diabetes mellitus type 2 presents to the ER because of increasing weakness.  Over the last 2 days patient has become progressively weak with increasing abdominal distention and over the last 24 hours unable to ambulate because of the profound weakness.  Denies any fall or loss of consciousness.  Denies any chest pain or shortness of but has been having increasing abdominal discomfort with distention.  Patient had paracentesis done about 5 days ago.  3 days [place] the church himself.  Patient also had diarrhea today.  Denies vomiting.  But because of the weakness over the last 2 days patient presents to the ER.  ED Course: In the ER patient's labs show worsening renal function creatinine has worsened from 1.22 weeks ago to 4.02 with hyperkalemia of potassium 6.  No EKG changes for hyperkalemia.  Albumin 2.  CBC shows hemoglobin of 11 platelets of 51 and chest x-ray showing possible infiltrates for which patient was started on antibiotics.  Patient received 1 L fluid bolus in the ER for acute renal failure.  And I have ordered 1 dose of patiromer.  Review of Systems: As per HPI, rest all negative.   Past Medical History:  Diagnosis Date  . Arthritis   . Cancer ([place])   . Diabetes mellitus without complication ([place])   . Hypertension     Past Surgical History:  Procedure Laterality Date  . HERNIA REPAIR    . JOINT REPLACEMENT Right    knee  . LIVER BIOPSY       reports that he quit smoking about 5 months ago. His smoking use included cigarettes. He has never used smokeless tobacco. He reports that he does not drink alcohol or use drugs.  Allergies  Allergen Reactions  .  Amoxicillin-Pot Clavulanate Anaphylaxis    Has patient had a PCN reaction causing immediate rash, facial/tongue/throat swelling, SOB or lightheadedness with hypotension: Yes Has patient had a PCN reaction causing severe rash involving mucus membranes or skin necrosis: Yes Has patient had a PCN reaction that required hospitalization: Yes Has patient had a PCN reaction occurring within the last 10 years: Yes If all of the above answers are "NO", then may proceed with Cephalosporin use.   . Nitrofurantoin Anaphylaxis    Family History  Problem Relation Age of Onset  . Diabetes Mother   . Dementia Mother   . Colon cancer Father   . CAD Neg Hx     Prior to Admission medications   Medication Sig Start Date End Date Taking? Authorizing Provider  carvedilol (COREG) 3.125 MG tablet Take 1 tablet (3.125 mg total) by mouth 2 (two) times daily with a meal. 12/24/17  Yes Mariel Aloe, MD  CINNAMON PO Take 600 mg by mouth 2 (two) times daily.   Yes [provider]  Cyanocobalamin (VITAMIN B-12 PO) Take 1 tablet by mouth daily.   Yes [provider]  fish oil-omega-3 fatty acids 1000 MG capsule Take 1 g by mouth 2 (two) times daily.   Yes [provider]  furosemide (LASIX) 20 MG tablet Take 3 tablets (60 mg total) by mouth 2 (two) times daily. Patient taking differently: Take 40 mg by mouth 2 (two)  times daily.  01/18/18  Yes Oswald Hillock, MD  HYDROcodone-acetaminophen (NORCO) 7.5-325 MG tablet Take 1 tablet by mouth every 6 (six) hours as needed for moderate pain. 02/25/18  Yes Gus Height, MD  loratadine (CLARITIN) 10 MG tablet Take 1 tablet (10 mg total) by mouth daily. 11/13/17  Yes Eugenie Filler, MD  metFORMIN (GLUCOPHAGE) 1000 MG tablet Take 1,000 mg by mouth 2 (two) times daily with a meal.   Yes [provider]  omeprazole (PRILOSEC) 20 MG capsule Take 20 mg by mouth daily.   Yes [provider]  Potassium Chloride ER 20 MEQ TBCR Take 20 mEq  by mouth 2 (two) times daily. 12/02/17  Yes Dessa Phi, DO  PRESCRIPTION MEDICATION Take 10 mLs by mouth every 12 (twelve) hours as needed (pain). Vicodin liquid 7.5/300   Yes [provider]  Red Yeast Rice 600 MG TABS Take 1 tablet by mouth 2 (two) times daily.    Yes [provider]  traMADol (ULTRAM) 50 MG tablet Take 1 tablet (50 mg total) by mouth every 6 (six) hours as needed for moderate pain. 02/17/18  Yes Curt Bears, MD  doxycycline (VIBRA-TABS) 100 MG tablet Take 1 tablet (100 mg total) by mouth 2 (two) times daily. Patient not taking: Reported on 02/25/2018 01/20/18   Curt Bears, MD  fluticasone [facility]) 50 MCG/ACT nasal spray Place 2 sprays into both nostrils daily as needed for allergies. Patient not taking: Reported on 02/25/2018 11/12/17   Eugenie Filler, MD  insulin aspart (NOVOLOG) 100 UNIT/ML injection CBG < 70: implement hypoglycemia protocol CBG 70 - 120: 0 units CBG 121 - 150: 2 units CBG 151 - 200: 3 units CBG 201 - 250: 5 units CBG 251 - 300: 8 units CBG 301 - 350: 11 units CBG 351 - 400: 15 units CBG > 400: call MD Patient not taking: Reported on 02/25/2018 10/08/17   Lavina Hamman, MD  insulin glargine (LANTUS) 100 UNIT/ML injection Inject 0.1 mLs (10 Units total) into the skin at bedtime. Patient not taking: Reported on 02/25/2018 01/18/18   Oswald Hillock, MD  nicotine (NICODERM CQ - DOSED IN MG/24 HOURS) 21 mg/24hr patch Place 1 patch (21 mg total) onto the skin daily. Patient not taking: Reported on 02/25/2018 11/12/17   Eugenie Filler, MD  polyethylene glycol [facility] Floria Raveling) packet Take 17 g by mouth daily. Patient not taking: Reported on 02/25/2018 10/09/17   Lavina Hamman, MD  senna-docusate (SENOKOT-S) 8.6-50 MG tablet Take 1 tablet by mouth at bedtime as needed for mild constipation. Patient not taking: Reported on 02/25/2018 01/18/18   Oswald Hillock, MD    Physical Exam: Vitals:   02/25/18 1657 02/25/18 1709 02/25/18  1830 02/25/18 1900  BP: (!) 111/57  121/65 109/63  Pulse: 77  85 75  Resp: 17  15 16   Temp: 97.6 F (36.4 C)     TempSrc: Oral     SpO2: 97%  98% 96%  Weight:  118.8 kg    Height:  6\' 3"  (1.905 m)        Constitutional: Moderately built and nourished. Vitals:   02/25/18 1657 02/25/18 1709 02/25/18 1830 02/25/18 1900  BP: (!) 111/57  121/65 109/63  Pulse: 77  85 75  Resp: 17  15 16   Temp: 97.6 F (36.4 C)     TempSrc: Oral     SpO2: 97%  98% 96%  Weight:  118.8 kg    Height:  6\' 3"  (1.905 m)     Eyes: Anicteric no pallor. ENMT: No discharge from the ears eyes nose or mouth. Neck: No mass felt.  No neck rigidity. Respiratory: No rhonchi or crepitations. Cardiovascular: S1-S2 heard. Abdomen: Distended nontender bowel sounds present. Musculoskeletal: Mild edema. Skin: No rash. Neurologic: Alert awake oriented to time place and person.  Moves all extremities.  Generally weak. Psychiatric: Appears normal.   Labs on Admission: I have personally reviewed following labs and imaging studies  CBC: Recent Labs  Lab 02/24/18 0939 02/25/18 1708  WBC 9.9 9.8  NEUTROABS 7.7  --   HGB 10.8* 11.0*  HCT 32.5* 33.4*  MCV 104.5* 105.0*  PLT 55* 51*   Basic Metabolic Panel: Recent Labs  Lab 02/24/18 0939 02/25/18 1708  NA 134* 133*  K 5.9* 6.0*  CL 101 101  CO2 20* 19*  GLUCOSE 152* 146*  BUN 39* 49*  CREATININE 3.99* 4.03*  CALCIUM 8.8* 8.2*   GFR: Estimated Creatinine Clearance: 23.7 mL/min (A) (by C-G formula based on SCr of 4.03 mg/dL (H)). Liver Function Tests: Recent Labs  Lab 02/24/18 0939 02/25/18 1708  AST 55* 62*  ALT 31 33  ALKPHOS 126 115  BILITOT 2.5* 2.7*  PROT 6.7 6.2*  ALBUMIN 2.0* 2.0*   Recent Labs  Lab 02/25/18 1708  LIPASE 41   No results for input(s): AMMONIA in the last 168 hours. Coagulation Profile: Recent Labs  Lab 02/25/18 1708  INR 1.25   Cardiac Enzymes: No results for input(s): CKTOTAL, CKMB, CKMBINDEX, TROPONINI in  the last 168 hours. BNP (last 3 results) No results for input(s): PROBNP in the last 8760 hours. HbA1C: No results for input(s): HGBA1C in the last 72 hours. CBG: Recent Labs  Lab 02/25/18 1711  GLUCAP 146*   Lipid Profile: No results for input(s): CHOL, HDL, LDLCALC, TRIG, CHOLHDL, LDLDIRECT in the last 72 hours. Thyroid Function Tests: Recent Labs    02/24/18 0939  TSH 4.151*   Anemia Panel: No results for input(s): VITAMINB12, FOLATE, FERRITIN, TIBC, IRON, RETICCTPCT in the last 72 hours. Urine analysis:    Component Value Date/Time   COLORURINE AMBER (A) 02/25/2018 1834   APPEARANCEUR CLEAR 02/25/2018 1834   LABSPEC 1.015 02/25/2018 1834   PHURINE 5.0 02/25/2018 1834   GLUCOSEU NEGATIVE 02/25/2018 1834   HGBUR NEGATIVE 02/25/2018 1834   BILIRUBINUR NEGATIVE 02/25/2018 1834   KETONESUR NEGATIVE 02/25/2018 1834   PROTEINUR NEGATIVE 02/25/2018 1834   NITRITE NEGATIVE 02/25/2018 1834   LEUKOCYTESUR NEGATIVE 02/25/2018 1834   Sepsis Labs: @LABRCNTIP (procalcitonin:4,lacticidven:4) )No results found for this or any previous visit (from the past 240 hour(s)).   Radiological Exams on Admission: Dg Chest 2 View  Result Date: 02/25/2018 CLINICAL DATA:  Productive cough with fatigue and abdominal distention EXAM: CHEST - 2 VIEW COMPARISON:  02/14/2018 abdominal CT FINDINGS: Hazy opacity in the right mid lung not seen on chest CT 11/26/2017. Normal heart size and mediastinal contours. No effusion or pneumothorax. Low lung volumes. IMPRESSION: Summation shadows versus subtle opacity in the right mid lung. This could be a focus of infection or metastatic disease. Electronically Signed   By: Monte Fantasia M.D.   On: 02/25/2018 19:17    EKG: Independently reviewed.  Normal sinus rhythm low voltage.  Assessment/Plan Principal Problem:   Acute renal failure (ARF) (HCC) Active Problems:   Diabetes mellitus without complication ([place])   Essential hypertension   Extensive stage  primary small cell carcinoma of lung (HCC)   Pancytopenia (HCC)  Severe protein-calorie malnutrition (HCC)   Chronic diastolic CHF (congestive heart failure) ([place])    1. Acute renal failure with hyperkalemia -likely multifactorial.  Including recent large volume paracentesis with poor oral intake and patient in addition taking Lasix.  Patient also had episodes of diarrhea today.  Patient received 1 L fluid bolus in the ER.  I have written for gentle hydration and albumin infusion given the low albumin levels and large ascites again.  Discontinue potassium replacement hold Lasix for now.  And for the hyperkalemia I have ordered patiromer.  Follow metabolic panel closely. 2. Possible pneumonia on empiric antibiotics. 3. Large ascites will probably need repeat paracentesis. 4. Diabetes mellitus type 2 we will keep patient on sliding scale coverage. 5. Hypertension on Coreg.   6. Diastolic CHF -hold Lasix due to acute renal failure.  Receiving fluids due to renal failure at this time.  Closely follow respiratory status. 7. Anemia and thrombocytopenia likely from malignancy and chemotherapy.  Follow CBC. 8. Severe protein calorie malnutrition.   DVT prophylaxis: SCDs. Code Status: Full code. Family Communication: Patient's daughter. Disposition Plan: Home. Consults called: None. Admission status: Inpatient.   Rise Patience MD [facility] Pager [phone].  If 7PM-7AM, please contact night-coverage [email or web address] Password [facility]  02/25/2018, 8:29 PM

## 2018-02-25 NOTE — Telephone Encounter (Signed)
Dr Julien Nordmann ordered hydrocodone 7.5/325 mg 1 tab every 6 hours prn pain.

## 2018-02-25 NOTE — Progress Notes (Signed)
Pharmacy Antibiotic Note  Robert Compton is a 72 y.o. male admitted on 02/25/2018 with pneumonia.  Pharmacy has been consulted for aztreonam + vancomycin dosing.  Pt presenting with generalized weakness. He has a history of stage IV lung cancer. Pt has PCN allergy (anaphylaxis), being started on vancomycin + aztreonam on admission.  Today, 02/25/18  SCr = 4 (elevated from SCr = 1.2 on 02/14/18), CrCl ~ 23 mL/min  Lactate 3.42  WBC 9.8, WNL  Plan:  Vancomycin 2000 mg loading dose ordered. Given significant elevation in SCr from baseline, will enter vancomycin variable dosing order and follow up with SCr tomorrow AM to determine appropriate dosing regimen to target AUC 400-500  Recommend checking MRSA PCR and consider discontinuing vancomycin if negative and suspecting respiratory source of infection  Daily SCr  Aztreonam 2 g IV dose ordered in ED. Will order vancomycin 1 g IV q8h given renal function  Height: 6\' 3"  (190.5 cm) Weight: 262 lb (118.8 kg) IBW/kg (Calculated) : 84.5  Temp (24hrs), Avg:97.6 F (36.4 C), Min:97.6 F (36.4 C), Max:97.6 F (36.4 C)  Recent Labs  Lab 02/24/18 0939 02/25/18 1708 02/25/18 1819  WBC 9.9 9.8  --   CREATININE 3.99* 4.03*  --   LATICACIDVEN  --   --  3.42*    Estimated Creatinine Clearance: 23.7 mL/min (A) (by C-G formula based on SCr of 4.03 mg/dL (H)).    Allergies  Allergen Reactions  . Amoxicillin-Pot Clavulanate Anaphylaxis    Has patient had a PCN reaction causing immediate rash, facial/tongue/throat swelling, SOB or lightheadedness with hypotension: Yes Has patient had a PCN reaction causing severe rash involving mucus membranes or skin necrosis: Yes Has patient had a PCN reaction that required hospitalization: Yes Has patient had a PCN reaction occurring within the last 10 years: Yes If all of the above answers are "NO", then may proceed with Cephalosporin use.   . Nitrofurantoin Anaphylaxis   Antimicrobials this  admission: vancomycin 11/12 >>  cefepime 11/12 >>   Dose adjustments this admission:  Microbiology results: 11/12 BCx: Sent 11/12 UCx: Sent   Thank you for allowing pharmacy to be a part of this patient's care.  Lenis Noon, PharmD Clinical Pharmacist 02/25/2018 8:40 PM

## 2018-02-25 NOTE — ED Notes (Signed)
Ed provider Tegeler made aware patient has a critical lactic acid value of 3.42

## 2018-02-25 NOTE — ED Notes (Signed)
Report given to floor RN Mariah.  Awaiting transport of pt to floor.

## 2018-02-25 NOTE — ED Notes (Signed)
Tech at bedside to collect blood cultures.

## 2018-02-25 NOTE — ED Notes (Signed)
Patient aware we need a urine sample. Patient will call out when ready.

## 2018-02-26 ENCOUNTER — Inpatient Hospital Stay (HOSPITAL_COMMUNITY): Payer: BLUE CROSS/BLUE SHIELD

## 2018-02-26 ENCOUNTER — Other Ambulatory Visit: Payer: Self-pay

## 2018-02-26 DIAGNOSIS — N179 Acute kidney failure, unspecified: Principal | ICD-10-CM

## 2018-02-26 DIAGNOSIS — E43 Unspecified severe protein-calorie malnutrition: Secondary | ICD-10-CM

## 2018-02-26 DIAGNOSIS — E883 Tumor lysis syndrome: Secondary | ICD-10-CM | POA: Diagnosis present

## 2018-02-26 DIAGNOSIS — T451X5A Adverse effect of antineoplastic and immunosuppressive drugs, initial encounter: Secondary | ICD-10-CM | POA: Diagnosis present

## 2018-02-26 DIAGNOSIS — E875 Hyperkalemia: Secondary | ICD-10-CM | POA: Diagnosis present

## 2018-02-26 DIAGNOSIS — J189 Pneumonia, unspecified organism: Secondary | ICD-10-CM

## 2018-02-26 DIAGNOSIS — C3490 Malignant neoplasm of unspecified part of unspecified bronchus or lung: Secondary | ICD-10-CM

## 2018-02-26 DIAGNOSIS — R188 Other ascites: Secondary | ICD-10-CM

## 2018-02-26 LAB — BASIC METABOLIC PANEL
ANION GAP: 10 (ref 5–15)
Anion gap: 11 (ref 5–15)
BUN: 53 mg/dL — ABNORMAL HIGH (ref 8–23)
BUN: 52 mg/dL — ABNORMAL HIGH (ref 8–23)
CHLORIDE: 101 mmol/L (ref 98–111)
CO2: 21 mmol/L — ABNORMAL LOW (ref 22–32)
CO2: 22 mmol/L (ref 22–32)
CREATININE: 3.68 mg/dL — AB (ref 0.61–1.24)
Calcium: 8.5 mg/dL — ABNORMAL LOW (ref 8.9–10.3)
Calcium: 8.6 mg/dL — ABNORMAL LOW (ref 8.9–10.3)
Chloride: 102 mmol/L (ref 98–111)
Creatinine, Ser: 3.89 mg/dL — ABNORMAL HIGH (ref 0.61–1.24)
GFR calc Af Amer: 18 mL/min — ABNORMAL LOW (ref >60)
GFR calc non Af Amer: 15 mL/min — ABNORMAL LOW (ref >60)
GFR, EST AFRICAN AMERICAN: 17 mL/min — AB (ref >60)
GFR, EST NON AFRICAN AMERICAN: 14 mL/min — AB (ref >60)
GLUCOSE: 170 mg/dL — AB (ref 70–99)
Glucose, Bld: 158 mg/dL — ABNORMAL HIGH (ref 70–99)
POTASSIUM: 6 mmol/L — AB (ref 3.5–5.1)
POTASSIUM: 5.9 mmol/L — AB (ref 3.5–5.1)
SODIUM: 133 mmol/L — AB (ref 135–145)
SODIUM: 134 mmol/L — AB (ref 135–145)

## 2018-02-26 LAB — GLUCOSE, CAPILLARY
GLUCOSE-CAPILLARY: 149 mg/dL — AB (ref 70–99)
GLUCOSE-CAPILLARY: 157 mg/dL — AB (ref 70–99)
GLUCOSE-CAPILLARY: 135 mg/dL — AB (ref 70–99)
Glucose-Capillary: 197 mg/dL — ABNORMAL HIGH (ref 70–99)

## 2018-02-26 LAB — URIC ACID: URIC ACID, SERUM: 13.1 mg/dL — AB (ref 3.7–8.6)

## 2018-02-26 LAB — CBC
HCT: 30.4 % — ABNORMAL LOW (ref 39.0–52.0)
HEMOGLOBIN: 10 g/dL — AB (ref 13.0–17.0)
MCH: 35.2 pg — ABNORMAL HIGH (ref 26.0–34.0)
MCHC: 32.9 g/dL (ref 30.0–36.0)
MCV: 107 fL — ABNORMAL HIGH (ref 80.0–100.0)
NRBC: 0 % (ref 0.0–0.2)
Platelets: 49 10*3/uL — ABNORMAL LOW (ref 150–400)
RBC: 2.84 MIL/uL — AB (ref 4.22–5.81)
RDW: 17.9 % — ABNORMAL HIGH (ref 11.5–15.5)
WBC: 8.7 10*3/uL (ref 4.0–10.5)

## 2018-02-26 LAB — MRSA PCR SCREENING: MRSA BY PCR: POSITIVE — AB

## 2018-02-26 LAB — POTASSIUM: POTASSIUM: 5.3 mmol/L — AB (ref 3.5–5.1)

## 2018-02-26 LAB — CREATININE, URINE, RANDOM: Creatinine, Urine: 145.55 mg/dL

## 2018-02-26 LAB — PHOSPHORUS: PHOSPHORUS: 4.8 mg/dL — AB (ref 2.5–4.6)

## 2018-02-26 LAB — AMMONIA: Ammonia: 55 umol/L — ABNORMAL HIGH (ref 9–35)

## 2018-02-26 LAB — SODIUM, URINE, RANDOM: Sodium, Ur: <10 mmol/L

## 2018-02-26 MED ORDER — SODIUM CHLORIDE 0.9 % IV SOLN
INTRAVENOUS | Status: DC
  Administered 2018-02-26 – 2018-03-03 (×11): via INTRAVENOUS

## 2018-02-26 MED ORDER — CHLORHEXIDINE GLUCONATE CLOTH 2 % EX PADS
6.0000 | MEDICATED_PAD | Freq: Every day | CUTANEOUS | Status: AC
  Administered 2018-02-27 – 2018-03-01 (×3): 6 via TOPICAL

## 2018-02-26 MED ORDER — SODIUM CHLORIDE 0.9 % IV SOLN
6.0000 mg | Freq: Once | INTRAVENOUS | Status: AC
  Administered 2018-02-26: 6 mg via INTRAVENOUS
  Filled 2018-02-26 (×2): qty 4

## 2018-02-26 MED ORDER — SODIUM POLYSTYRENE SULFONATE 15 GM/60ML PO SUSP
45.0000 g | Freq: Once | ORAL | Status: AC
  Administered 2018-02-26: 45 g via ORAL
  Filled 2018-02-26: qty 180

## 2018-02-26 MED ORDER — MUPIROCIN 2 % EX OINT
1.0000 "application " | TOPICAL_OINTMENT | Freq: Two times a day (BID) | CUTANEOUS | Status: AC
  Administered 2018-02-26 – 2018-03-02 (×10): 1 via NASAL
  Filled 2018-02-26 (×4): qty 22

## 2018-02-26 NOTE — Progress Notes (Addendum)
PROGRESS NOTE    Robert Compton  OXB:[id number] DOB: 08-Nov-1945 DOA: 02/25/2018 PCP: [name], MD    Brief Narrative:  Patient is a 72 year old male history of extensive stage small cell lung cancer started on immunotherapy, history of hypertension, diabetes type 2 presented to the ED with increasing weakness and increasing abdominal distention.  Patient noted to be in acute renal failure, hyperkalemic with a potassium of 6, chest x-ray showing possible infiltrates.  Patient admitted.  Placed on IV fluids.  Ultrasound-guided paracentesis ordered.  Oncology consulted.   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  Creatinine on admission was 4.03 from 1.20 on 02/14/2018.  Patient per admitting physician noted to have some episodes of diarrhea.  Blood pressure borderline.  Check a urine sodium, urine creatinine.  Check a renal ultrasound.  Replace Foley catheter.  Strict I's and O's.  Daily weights.  Hold diuretics.  Increase IV fluids to 125 cc/h monitor volume status closely.  Follow.  2.  Hyperkalemia Likely secondary to problem #1 versus tumor lysis syndrome as patient noted to have an elevated uric acid level and elevated phosphorus level.  Give a dose of Kayexalate.  We will give IV rasburicase.  Follow.  3.  Probable tumor lysis syndrome Patient had presented with acute renal failure noted to be  hyperkalemic with a potassium as high as 6.  Uric acid level elevated as well as phosphorus level.  Likely tumor lysis syndrome.  Discussed with oncology.  We will give a dose of IV rasburicase.  IV fluids.  Supportive care.  Follow.  4.  Hypertension Continue Coreg and monitor blood pressure closely.  Follow.  5.  Chronic diastolic CHF Currently stable.  Euvolemic.  Diuretics on hold secondary to problem #1.  6.  Large ascites Patient states had recent paracentesis done 5 days prior to admission with 11 L removed.  Patient with abdominal distention.  Likely secondary to metastatic small cell lung cancer.  Ultrasound-guided therapeutic thoracentesis.  We will send fluid for cytology.  Continue IV albumin.  Follow.  7.  Diabetes mellitus type 2 Hemoglobin A1c was 7.2 on 09/28/2017.  CBG of 149 this morning.  Hold oral hypoglycemic agents.  Continue sliding scale insulin.  8.  Possible pneumonia/HCAP Noted on chest x-ray.  Patient currently afebrile however patient immunocompromised.  Continue empiric IV aztreonam and IV vancomycin and treat for 5-7 days empirically.  Supportive care.  9.  Severe protein calorie malnutrition Secondary to metastatic small cell lung cancer.  Patient with poor prognosis.  10.  Anemia/thrombocytopenia Likely secondary to malignancy and chemotherapy.  Patient with no overt bleeding.  Follow.  Transfusion threshold hemoglobin less than 7 and platelet count less than 10.  Oncology following.  11.  Extensive stage small cell lung cancer with right upper lobe mass Patient with some pulmonary nodules and significant mediastinal lymphadenopathy and liver mets diagnosed in June 2019.  Patient status post 5 cycles of chemotherapy and  patient noted to be on maintenance treatment with single agent tecentriq first dose was February 24, 2018.  Patient with a poor prognosis felt per oncology and recommending palliative care.  Palliative care consultation for goals of  care.   DVT prophylaxis: SCDs Code Status: Full Family Communication: Updated patient.  No family at bedside. Disposition Plan: To be determined.  Palliative care consultation pending.   Consultants:   Oncology: Dr. Lorna Few 02/26/2018  Palliative care consultation pending.  Procedures:   Ultrasound-guided paracentesis pending 02/26/2018  CT abdomen and pelvis 02/25/2018  Chest x-ray 02/25/2018    Antimicrobials:  IV aztreonam 02/25/2018  IV vancomycin 02/25/2018   Subjective: Patient laying in bed.  Patient tearful.  Patient sad.  Patient states was told by oncology there was nothing further they could do for him in terms of his cancer.  Patient states a little bit of improvement with abdominal discomfort after Foley catheter was placed.  Objective: Vitals:   02/26/18 1449 02/26/18 1455 02/26/18 1456 02/26/18 1502  BP: 100/61 (!) 105/59 103/62 108/62  Pulse:      Resp:      Temp:      TempSrc:      SpO2:      Weight:      Height:        Intake/Output Summary (Last 24 hours) at 02/26/2018 1633 Last data filed at 02/26/2018 1532 Gross per 24 hour  Intake 2231.17 ml  Output 375 ml  Net 1856.17 ml   Filed Weights   02/25/18 1709 02/25/18 2223 02/26/18 0514  Weight: 118.8 kg 123.7 kg 124.9 kg    Examination:  General exam: Appears calm and comfortable.  Tearful. Respiratory system: Clear to auscultation. Respiratory effort normal. Cardiovascular system: S1 & S2 heard, RRR. No JVD, murmurs, rubs, gallops or clicks. No pedal edema. Gastrointestinal system: Abdomen is distended, soft, nontender to palpation, positive bowel sounds.  No rebound.  No guarding.  Central nervous system: Alert and oriented. No focal neurological deficits. Extremities: Symmetric 5 x 5 power. Skin: No rashes, lesions or ulcers Psychiatry: Judgement and insight appear normal. Mood & affect flat.     Data Reviewed: I have personally reviewed following labs and imaging  studies  CBC: Recent Labs  Lab 02/24/18 0939 02/25/18 1708 02/26/18 0525  WBC 9.9 9.8 8.7  NEUTROABS 7.7  --   --   HGB 10.8* 11.0* 10.0*  HCT 32.5* 33.4* 30.4*  MCV 104.5* 105.0* 107.0*  PLT 55* 51* 49*   Basic Metabolic Panel: Recent Labs  Lab 02/24/18 0939 02/25/18 1708 02/26/18 0525 02/26/18 0812  NA 134* 133* 133* 134*  K 5.9* 6.0* 6.0* 5.9*  CL 101 101 102 101  CO2 20* 19* 21* 22  GLUCOSE 152* 146* 170* 158*  BUN 39* 49* 53* 52*  CREATININE 3.99* 4.03* 3.89* 3.68*  CALCIUM 8.8* 8.2* 8.5* 8.6*  PHOS  --   --   --  4.8*   GFR: Estimated Creatinine Clearance: 26.6 mL/min (A) (by C-G formula based on SCr of 3.68 mg/dL (H)). Liver Function Tests: Recent Labs  Lab 02/24/18 0939 02/25/18 1708  AST 55* 62*  ALT 31 33  ALKPHOS 126 115  BILITOT 2.5* 2.7*  PROT 6.7 6.2*  ALBUMIN 2.0* 2.0*   Recent Labs  Lab 02/25/18 1708  LIPASE 41   Recent Labs  Lab 02/25/18 2258  AMMONIA 55*   Coagulation Profile: Recent Labs  Lab 02/25/18 1708  INR 1.25   Cardiac Enzymes: No results for  input(s): CKTOTAL, CKMB, CKMBINDEX, TROPONINI in the last 168 hours. BNP (last 3 results) No results for input(s): PROBNP in the last 8760 hours. HbA1C: No results for input(s): HGBA1C in the last 72 hours. CBG: Recent Labs  Lab 02/25/18 1711 02/25/18 2212 02/26/18 0729 02/26/18 1216  GLUCAP 146* 153* 149* 197*   Lipid Profile: No results for input(s): CHOL, HDL, LDLCALC, TRIG, CHOLHDL, LDLDIRECT in the last 72 hours. Thyroid Function Tests: Recent Labs    02/24/18 0939  TSH 4.151*   Anemia Panel: No results for input(s): VITAMINB12, FOLATE, FERRITIN, TIBC, IRON, RETICCTPCT in the last 72 hours. Sepsis Labs: Recent Labs  Lab 02/25/18 1819  LATICACIDVEN 3.42*    Recent Results (from the past 240 hour(s))  Blood culture (routine x 2)     Status: None (Preliminary result)   Collection Time: 02/25/18  8:13 PM  Result Value Ref Range Status   Specimen  Description   Final    BLOOD LEFT WRIST Performed at [facility], [address], [place], [place] [address]    Special Requests   Final    BOTTLES DRAWN AEROBIC AND ANAEROBIC Blood Culture adequate volume Performed at [facility] [address], [place], [place] [address]    Culture PENDING  Incomplete   Report Status PENDING  Incomplete  Blood culture (routine x 2)     Status: None (Preliminary result)   Collection Time: 02/25/18 10:58 PM  Result Value Ref Range Status   Specimen Description   Final    BLOOD LEFT HAND Performed at [facility], [place] [address] [place], [place] [address]    Special Requests   Final    AEROBIC BOTTLE ONLY Blood Culture results may not be optimal due to an inadequate volume of blood received in culture bottles Performed at [facility] [address], [place], [place] [address]    Culture PENDING  Incomplete   Report Status PENDING  Incomplete  MRSA PCR Screening     Status: Abnormal   Collection Time: 02/26/18  5:08 AM  Result Value Ref Range Status   MRSA by PCR POSITIVE (A) NEGATIVE Final    Comment:        The GeneXpert MRSA Assay (FDA approved for NASAL specimens only), is one component of a comprehensive MRSA colonization surveillance program. It is not intended to diagnose MRSA infection nor to guide or monitor treatment for MRSA infections. RESULT CALLED TO, READ BACK BY AND VERIFIED WITH: CAMPBELL,M RN AT 6811 02/26/18 BY TIBBITTS,K Performed at [facility], [place] [address], [place], [place] [address]          Radiology Studies: Ct Abdomen Pelvis Wo Contrast  Result Date: 02/25/2018 CLINICAL DATA:  Generalized weakness. Back pain. Stage IV lung cancer, spread the liver. Receiving chemotherapy. EXAM: CT ABDOMEN AND PELVIS WITHOUT CONTRAST TECHNIQUE: Multidetector CT imaging of the abdomen and pelvis was performed following the standard protocol without  IV contrast. COMPARISON:  February 14, 2018 FINDINGS: Lower chest: No suspicious findings in the lung bases. Hepatobiliary: The patient has known liver lesions are not as well seen without contrast. The largest is in the right hepatic lobe on series 8, image 27. Mildly nodular contour to the liver suggesting cirrhosis. The gallbladder is unremarkable. Pancreas: Unremarkable. No pancreatic ductal dilatation or surrounding inflammatory changes. Spleen: Normal in size without focal abnormality. Adrenals/Urinary Tract: Adrenal glands are normal. There is a cyst in the upper pole the left kidney,  unchanged. The kidneys, ureters, and bladder are otherwise normal. Stomach/Bowel: The stomach and small bowel are normal. The colon is unremarkable. The appendix is not visualized but there is no secondary evidence of appendicitis. Vascular/Lymphatic: Atherosclerotic changes are seen in the nonaneurysmal aorta. No adenopathy. Reproductive: Prostate is unremarkable. Other: Marked ascites in the abdomen and pelvis is similar in the interval. No free air. Musculoskeletal: Sclerotic lesion in L2 on series 6, image 69, stable in the interval. Mild sclerosis along the superior endplate of L1 on image 69 as well. Sclerotic focus in T9 on image 69 also. IMPRESSION: 1. Diffuse ascites, unchanged. 2. Cirrhosis. 3. Known lesions in the liver are more difficult to evaluate without contrast but not definitely changed. 4. Scattered sclerotic lesions in the lumbar and thoracic spine are nonspecific but may be metastatic given history of lung cancer. 5. Atherosclerotic changes in the nonaneurysmal aorta. Electronically Signed   By: Dorise Bullion III M.D   On: 02/25/2018 21:29   Dg Chest 2 View  Result Date: 02/25/2018 CLINICAL DATA:  Productive cough with fatigue and abdominal distention EXAM: CHEST - 2 VIEW COMPARISON:  02/14/2018 abdominal CT FINDINGS: Hazy opacity in the right mid lung not seen on chest CT 11/26/2017. Normal heart size  and mediastinal contours. No effusion or pneumothorax. Low lung volumes. IMPRESSION: Summation shadows versus subtle opacity in the right mid lung. This could be a focus of infection or metastatic disease. Electronically Signed   By: Monte Fantasia M.D.   On: 02/25/2018 19:17   US Renal  Result Date: 02/26/2018 CLINICAL DATA:  Acute renal failure EXAM: RENAL / URINARY TRACT ULTRASOUND COMPLETE COMPARISON:  CT abdomen pelvis 02/25/2018 FINDINGS: Right Kidney: Renal measurements: 11.3 x 4.7 x 6.2 cm = volume: 174 mL . Echogenicity within normal limits. No mass or hydronephrosis visualized. Left Kidney: Renal measurements: 11.6 x 5.7 x 6.5 cm = volume: 223 mL. Echogenicity within normal limits. No mass or hydronephrosis visualized. Left upper pole cyst 2.5 cm Bladder: Bladder empty with Foley catheter.  Moderate ascites in the pelvis IMPRESSION: Sonographically normal kidneys. Moderate ascites. Electronically Signed   By: Franchot Gallo M.D.   On: 02/26/2018 11:20   US Paracentesis  Result Date: 02/26/2018 INDICATION: Patient with history of small cell lung cancer, recurrent ascites. Request made for diagnostic and therapeutic paracentesis. EXAM: ULTRASOUND GUIDED DIAGNOSTIC AND THERAPEUTIC PARACENTESIS MEDICATIONS: None COMPLICATIONS: None immediate. PROCEDURE: Informed written consent was obtained from the patient after a discussion of the risks, benefits and alternatives to treatment. A timeout was performed prior to the initiation of the procedure. Initial ultrasound scanning demonstrates a large amount of ascites within the left lower abdominal quadrant. The left lower abdomen was prepped and draped in the usual sterile fashion. 1% lidocaine was used for local anesthesia. Following this, a 19 gauge, 10-cm, Yueh catheter was introduced. An ultrasound image was saved for documentation purposes. The paracentesis was performed. The catheter was removed and a dressing was applied. The patient tolerated the  procedure well without immediate post procedural complication. FINDINGS: A total of approximately 8.4 liters of amber/blood-tinged fluid was removed. Samples were sent to the laboratory as requested by the clinical team. IMPRESSION: Successful ultrasound-guided diagnostic and therapeutic paracentesis yielding 8.4 liters of peritoneal fluid. Read by: Rowe Hawk, PA-C Electronically Signed   By: Corrie Mckusick D.O.   On: 02/26/2018 15:47        Scheduled Meds: . carvedilol  3.125 mg Oral BID WC  . Chlorhexidine Gluconate Cloth  6  each Topical [id number]  . insulin aspart  0-9 Units Subcutaneous TID WC  . mupirocin ointment  1 application Nasal BID  . omega-3 acid ethyl esters  1 g Oral QHS  . pantoprazole  40 mg Oral Daily  . vancomycin variable dose per unstable renal function (pharmacist dosing)   Does not apply See admin instructions  . vitamin B-12  100 mcg Oral Daily   Continuous Infusions: . sodium chloride 125 mL/hr at 02/26/18 0922  . albumin human 25 g (02/26/18 1107)  . aztreonam Stopped (02/26/18 0600)     LOS: 1 day    Time spent: 40 minutes    Irine Seal, MD [facility] Pager [phone]  If 7PM-7AM, please contact night-coverage [email or web address] Password [facility] 02/26/2018, 4:33 PM

## 2018-02-26 NOTE — Progress Notes (Signed)
Subjective: The patient is seen and examined today.  No family member were at the bedside.  He is feeling a little bit better today but continues to have significant fatigue and weakness as well as shortness of breath with minimal exertion.  He also has abdominal distention.  He recently underwent ultrasound-guided paracentesis with drainage of over 11 L of fluid.  The cytology was negative for malignancy.  Vision denied having any current fever or chills.  He has no nausea or vomiting.  Objective: Vital signs in last 24 hours: Temp:  [97.5 F (36.4 C)-98.4 F (36.9 C)] 97.5 F (36.4 C) (11/13 0514) Pulse Rate:  [75-93] 88 (11/13 0514) Resp:  [15-20] 18 (11/13 0514) BP: (109-127)/(52-68) 121/67 (11/13 0514) SpO2:  [94 %-98 %] 94 % (11/13 0514) Weight:  [262 lb (118.8 kg)-275 lb 5.7 oz (124.9 kg)] 275 lb 5.7 oz (124.9 kg) (11/13 0514)  Intake/Output from previous day: 11/12 0701 - 11/13 0700 In: 1751.2 [I.V.:450; IV Piggyback:1301.2] Out: 200 [Urine:200] Intake/Output this shift: No intake/output data recorded.  General appearance: alert, cooperative, fatigued and icteric Resp: clear to auscultation bilaterally Cardio: regular rate and rhythm, S1, S2 normal, no murmur, click, rub or gallop GI: abnormal findings:  ascites and distended Extremities: edema 2+ edema  Lab Results:  Recent Labs    02/25/18 1708 02/26/18 0525  WBC 9.8 8.7  HGB 11.0* 10.0*  HCT 33.4* 30.4*  PLT 51* 49*   BMET Recent Labs    02/25/18 1708 02/26/18 0525  NA 133* 133*  K 6.0* 6.0*  CL 101 102  CO2 19* 21*  GLUCOSE 146* 170*  BUN 49* 53*  CREATININE 4.03* 3.89*  CALCIUM 8.2* 8.5*    Studies/Results: Ct Abdomen Pelvis Wo Contrast  Result Date: 02/25/2018 CLINICAL DATA:  Generalized weakness. Back pain. Stage IV lung cancer, spread the liver. Receiving chemotherapy. EXAM: CT ABDOMEN AND PELVIS WITHOUT CONTRAST TECHNIQUE: Multidetector CT imaging of the abdomen and pelvis was performed  following the standard protocol without IV contrast. COMPARISON:  February 14, 2018 FINDINGS: Lower chest: No suspicious findings in the lung bases. Hepatobiliary: The patient has known liver lesions are not as well seen without contrast. The largest is in the right hepatic lobe on series 8, image 27. Mildly nodular contour to the liver suggesting cirrhosis. The gallbladder is unremarkable. Pancreas: Unremarkable. No pancreatic ductal dilatation or surrounding inflammatory changes. Spleen: Normal in size without focal abnormality. Adrenals/Urinary Tract: Adrenal glands are normal. There is a cyst in the upper pole the left kidney, unchanged. The kidneys, ureters, and bladder are otherwise normal. Stomach/Bowel: The stomach and small bowel are normal. The colon is unremarkable. The appendix is not visualized but there is no secondary evidence of appendicitis. Vascular/Lymphatic: Atherosclerotic changes are seen in the nonaneurysmal aorta. No adenopathy. Reproductive: Prostate is unremarkable. Other: Marked ascites in the abdomen and pelvis is similar in the interval. No free air. Musculoskeletal: Sclerotic lesion in L2 on series 6, image 69, stable in the interval. Mild sclerosis along the superior endplate of L1 on image 69 as well. Sclerotic focus in T9 on image 69 also. IMPRESSION: 1. Diffuse ascites, unchanged. 2. Cirrhosis. 3. Known lesions in the liver are more difficult to evaluate without contrast but not definitely changed. 4. Scattered sclerotic lesions in the lumbar and thoracic spine are nonspecific but may be metastatic given history of lung cancer. 5. Atherosclerotic changes in the nonaneurysmal aorta. Electronically Signed   By: Dorise Bullion III M.D   On: 02/25/2018 21:29  Dg Chest 2 View  Result Date: 02/25/2018 CLINICAL DATA:  Productive cough with fatigue and abdominal distention EXAM: CHEST - 2 VIEW COMPARISON:  02/14/2018 abdominal CT FINDINGS: Hazy opacity in the right mid lung not seen  on chest CT 11/26/2017. Normal heart size and mediastinal contours. No effusion or pneumothorax. Low lung volumes. IMPRESSION: Summation shadows versus subtle opacity in the right mid lung. This could be a focus of infection or metastatic disease. Electronically Signed   By: Monte Fantasia M.D.   On: 02/25/2018 19:17    Medications: I have reviewed the patient's current medications.  Assessment/Plan: This is a very pleasant 71 years old white male with extensive stage small cell lung cancer diagnosed in June 2019 status post 5 cycles of systemic chemotherapy with carboplatin and etoposide with partial response and significant improvement in his general condition.  Unfortunately the patient was found to have evidence for disease progression recently.  It was expected to start treatment with immunotherapy with Tecentriq but this was declined by his insurance.  His condition has been deteriorated rapidly with recurrent accumulation of ascites.  He has a drainage of over 11 L of ascitic fluid in few days and he has reaccumulation of the fluid again in his abdomen. I had a lengthy discussion with the patient today about his condition, prognosis and treatment options.  I do not think the patient would be a good candidate for additional systemic therapy at this point.  I strongly recommend for the patient to consider palliative care and hospice referral at this point.  For relief of his symptoms I would recommend for the patient to have ultrasound-guided paracentesis for drainage of the ascites and relief of the abdominal distention.  The patient is in agreement with the palliative care and hospice at this point. I will be happy to take care of his hospice needs after discharge. He was advised to call if he has any concerning symptoms after discharge. Thank you for taking good care of Mr. Heintzelman, I will continue to follow up the patient with you and assist in his management on as-needed basis.   LOS: 1 day     Eilleen Kempf 02/26/2018

## 2018-02-26 NOTE — Progress Notes (Signed)
CRITICAL VALUE ALERT  Critical Value: Positive MRSA   Date & Time Notied:  0622  Provider Notified: Schorr NP  Orders Received/Actions taken:  Standing orders

## 2018-02-26 NOTE — Procedures (Signed)
Ultrasound-guided diagnostic and therapeutic paracentesis performed yielding 8.4 liters of amber/blood-tinged fluid. No immediate complications. A portion of the fluid was sent to the lab for cytology.

## 2018-02-27 ENCOUNTER — Ambulatory Visit: Payer: BLUE CROSS/BLUE SHIELD | Admitting: Radiation Oncology

## 2018-02-27 ENCOUNTER — Telehealth: Payer: Self-pay | Admitting: Medical Oncology

## 2018-02-27 ENCOUNTER — Ambulatory Visit
Admit: 2018-02-27 | Discharge: 2018-02-27 | Disposition: A | Discharge status: Home / Self Care | Payer: BLUE CROSS/BLUE SHIELD | Attending: Radiation Oncology | Admitting: Radiation Oncology

## 2018-02-27 DIAGNOSIS — Z7189 Other specified counseling: Secondary | ICD-10-CM

## 2018-02-27 DIAGNOSIS — C3490 Malignant neoplasm of unspecified part of unspecified bronchus or lung: Secondary | ICD-10-CM

## 2018-02-27 DIAGNOSIS — Z515 Encounter for palliative care: Secondary | ICD-10-CM

## 2018-02-27 LAB — CBC WITH DIFFERENTIAL/PLATELET
ABS IMMATURE GRANULOCYTES: 0.13 10*3/uL — AB (ref 0.00–0.07)
BASOS ABS: 0 10*3/uL (ref 0.0–0.1)
Basophils Relative: 0 %
Eosinophils Absolute: 0.1 10*3/uL (ref 0.0–0.5)
Eosinophils Relative: 1 %
HCT: 28.6 % — ABNORMAL LOW (ref 39.0–52.0)
HEMOGLOBIN: 9.3 g/dL — AB (ref 13.0–17.0)
Immature Granulocytes: 2 %
LYMPHS PCT: 12 %
Lymphs Abs: 0.8 10*3/uL (ref 0.7–4.0)
MCH: 35.5 pg — ABNORMAL HIGH (ref 26.0–34.0)
MCHC: 32.5 g/dL (ref 30.0–36.0)
MCV: 109.2 fL — ABNORMAL HIGH (ref 80.0–100.0)
Monocytes Absolute: 0.6 10*3/uL (ref 0.1–1.0)
Monocytes Relative: 9 %
NEUTROS ABS: 5.1 10*3/uL (ref 1.7–7.7)
NRBC: 0 % (ref 0.0–0.2)
Neutrophils Relative %: 76 %
PLATELETS: 41 10*3/uL — AB (ref 150–400)
RBC: 2.62 MIL/uL — AB (ref 4.22–5.81)
RDW: 18.4 % — ABNORMAL HIGH (ref 11.5–15.5)
WBC: 6.8 10*3/uL (ref 4.0–10.5)

## 2018-02-27 LAB — BASIC METABOLIC PANEL
Anion gap: 11 (ref 5–15)
BUN: 46 mg/dL — ABNORMAL HIGH (ref 8–23)
CHLORIDE: 104 mmol/L (ref 98–111)
CO2: 22 mmol/L (ref 22–32)
CREATININE: 3.32 mg/dL — AB (ref 0.61–1.24)
Calcium: 8.3 mg/dL — ABNORMAL LOW (ref 8.9–10.3)
GFR calc non Af Amer: 17 mL/min — ABNORMAL LOW (ref >60)
GFR, EST AFRICAN AMERICAN: 20 mL/min — AB (ref >60)
Glucose, Bld: 180 mg/dL — ABNORMAL HIGH (ref 70–99)
POTASSIUM: 4.3 mmol/L (ref 3.5–5.1)
SODIUM: 137 mmol/L (ref 135–145)

## 2018-02-27 LAB — GLUCOSE, CAPILLARY
GLUCOSE-CAPILLARY: 141 mg/dL — AB (ref 70–99)
GLUCOSE-CAPILLARY: 161 mg/dL — AB (ref 70–99)
Glucose-Capillary: 192 mg/dL — ABNORMAL HIGH (ref 70–99)
Glucose-Capillary: 146 mg/dL — ABNORMAL HIGH (ref 70–99)

## 2018-02-27 LAB — URINE CULTURE: Culture: NO GROWTH

## 2018-02-27 LAB — RASBURICASE - URIC ACID: Uric Acid, Serum: 4.2 mg/dL (ref 3.7–8.6)

## 2018-02-27 LAB — VANCOMYCIN, RANDOM: Vancomycin Rm: 10

## 2018-02-27 LAB — PHOSPHORUS: PHOSPHORUS: 4.1 mg/dL (ref 2.5–4.6)

## 2018-02-27 MED ORDER — VANCOMYCIN HCL 10 G IV SOLR
1750.0000 mg | Freq: Once | INTRAVENOUS | Status: AC
  Administered 2018-02-27: 1750 mg via INTRAVENOUS
  Filled 2018-02-27: qty 1750

## 2018-02-27 MED ORDER — SODIUM CHLORIDE 0.9 % IV BOLUS
500.0000 mL | Freq: Once | INTRAVENOUS | Status: AC
  Administered 2018-02-27: 500 mL via INTRAVENOUS

## 2018-02-27 MED ORDER — ALBUMIN HUMAN 25 % IV SOLN
25.0000 g | Freq: Once | INTRAVENOUS | Status: AC
  Administered 2018-02-27: 25 g via INTRAVENOUS
  Filled 2018-02-27: qty 100

## 2018-02-27 NOTE — Telephone Encounter (Signed)
[name] called and pt is very down due to "insurance denial for immunotherapy, What is the reason for the denial?" Pt was told there is nothing more to due but another provider told him his condition is related to electrolyte abnormalities. [name] said pt does not understand what is going on and does not understand why there is nothing more to offer for his cancer.  I told [name] to call when he was discharged and we can schedule f/u with [facility].

## 2018-02-27 NOTE — Evaluation (Signed)
Physical Therapy Evaluation Patient Details Name: Robert Compton MRN: [id number] DOB: January 28, 1946 Today's Date: 02/27/2018   History of Present Illness  72 yo male admitted 11/12 for weakness, back pain, small cell lung cancer with mets to liver. Pt with multiple admissions over the last 6 months for cancer-related issues. PMH includes CHF, malnutrition, edema, tachycardia, neutropenia, paracentesis 5 days ago with 11L fluid removed and 11/13 with 8L removed, HTN, OA, RTKR, former smoker, ARF, DMII.   Clinical Impression   Pt presents with severe back pain, difficulty performing bed mobility and transfers, decreased tolerance for ambulation, LE weakness, and unsteadiness with gait. Pt to benefit from acute PT to address deficits. Pt ambulated 10 ft in room with min assist for steadying. Pt with poor cancer prognosis at this time. PT recommending SNF placement due to pt's deficits and health status at this time. PT will continue to follow acutely.      Follow Up Recommendations SNF;Supervision for mobility/OOB    Equipment Recommendations  None recommended by PT    Recommendations for Other Services       Precautions / Restrictions Precautions Precautions: Fall Restrictions Weight Bearing Restrictions: No      Mobility  Bed Mobility Overal bed mobility: Needs Assistance Bed Mobility: Supine to Sit;Sit to Supine     Supine to sit: Min assist;HOB elevated Sit to supine: HOB elevated;+2 for physical assistance;+2 for safety/equipment;Max assist;Mod assist   General bed mobility comments: Min assist for supine <>sit for LE management, trunk elevation, scooting to and from EOB. Pt requires max assist +2 for scooting up in bed with bed pads.   Transfers Overall transfer level: Needs assistance Equipment used: Rolling walker (2 wheeled) Transfers: Sit to/from Stand Sit to Stand: From elevated surface;Min assist         General transfer comment: Min assist for power up,  steadying upon standing. Verbal cuing for hand placement.   Ambulation/Gait Ambulation/Gait assistance: Min assist Gait Distance (Feet): 10 Feet Assistive device: Rolling walker (2 wheeled) Gait Pattern/deviations: Step-through pattern;Decreased stride length;Trunk flexed Gait velocity: decr    General Gait Details: Min assist for steadying during ambulation. Multiple verbal cues to step into RW and stand upright. Pt reports back pain too high to stand with erect posture.  Stairs            Wheelchair Mobility    Modified Rankin (Stroke Patients Only)       Balance Overall balance assessment: Needs assistance Sitting-balance support: Single extremity supported;Feet supported Sitting balance-Leahy Scale: Fair     Standing balance support: Bilateral upper extremity supported Standing balance-Leahy Scale: Poor Standing balance comment: relies on PT and RW for steadying                              Pertinent Vitals/Pain Pain Assessment: 0-10 Pain Score: 8  Pain Location: back  Pain Descriptors / Indicators: Stabbing Pain Intervention(s): Limited activity within patient's tolerance;Repositioned;Monitored during session    Home Living Family/patient expects to be discharged to:: Private residence Living Arrangements: Children Available Help at Discharge: Family;Available PRN/intermittently Type of Home: House Home Access: Stairs to enter Entrance Stairs-Rails: None Entrance Stairs-Number of Steps: 2 Home Layout: One level Home Equipment: Walker - 2 wheels;Tub bench;Bedside commode;Hospital bed Additional Comments: Pt sleeps in recliner     Prior Function Level of Independence: Needs assistance   Gait / Transfers Assistance Needed: household ambulator with RW, wants rollator because he states he can  do a lot better if he had that   ADL's / Homemaking Assistance Needed: son assists with dressing, sponge bath, transfers, walking         Hand  Dominance   Dominant Hand: Right    Extremity/Trunk Assessment   Upper Extremity Assessment Upper Extremity Assessment: Defer to OT evaluation    Lower Extremity Assessment Lower Extremity Assessment: Generalized weakness    Cervical / Trunk Assessment Cervical / Trunk Assessment: Normal  Communication   Communication: No difficulties  Cognition Arousal/Alertness: Awake/alert Behavior During Therapy: WFL for tasks assessed/performed Overall Cognitive Status: Within Functional Limits for tasks assessed                                        General Comments      Exercises     Assessment/Plan    PT Assessment Patient needs continued PT services  PT Problem List Decreased strength;Pain;Decreased range of motion;Decreased activity tolerance;Decreased knowledge of use of DME;Decreased balance;Decreased safety awareness;Decreased mobility       PT Treatment Interventions DME instruction;Therapeutic activities;Gait training;Therapeutic exercise;Patient/family education;Balance training;Functional mobility training    PT Goals (Current goals can be found in the Care Plan section)  Acute Rehab PT Goals Patient Stated Goal: decrease back pain  PT Goal Formulation: With patient Time For Goal Achievement: 03/13/18 Potential to Achieve Goals: Fair    Frequency Min 2X/week   Barriers to discharge        Co-evaluation               AM-PAC PT "6 Clicks" Daily Activity  Outcome Measure Difficulty turning over in bed (including adjusting bedclothes, sheets and blankets)?: Unable Difficulty moving from lying on back to sitting on the side of the bed? : Unable Difficulty sitting down on and standing up from a chair with arms (e.g., wheelchair, bedside commode, etc,.)?: Unable Help needed moving to and from a bed to chair (including a wheelchair)?: A Lot Help needed walking in hospital room?: A Little Help needed climbing 3-5 steps with a railing? : A  Lot 6 Click Score: 10    End of Session Equipment Utilized During Treatment: Gait belt Activity Tolerance: Patient tolerated treatment well;Patient limited by pain Patient left: in bed;with bed alarm set;with call bell/phone within reach Nurse Communication: Mobility status PT Visit Diagnosis: Unsteadiness on feet (R26.81);Other abnormalities of gait and mobility (R26.89)    Time: 1330-1404 PT Time Calculation (min) (ACUTE ONLY): 34 min   Charges:   PT Evaluation $PT Eval Low Complexity: 1 Low PT Treatments $Gait Training: 8-22 mins       Julien Girt, PT Acute Rehabilitation Services Pager [phone]  Office [phone]   Clarie Camey D Elonda Husky 02/27/2018, 3:32 PM

## 2018-02-27 NOTE — Care Management Note (Signed)
Case Management Note  Patient Details  Name: Robert Compton MRN: [id number] Date of Birth: 05-19-1945  Subjective/Objective: ARF. Hx: NSLC. Full code.Palliative cons-recc SNF w/PCS-CSW following.                  Action/Plan:dc SNF w/PCS.   Expected Discharge Date:  (unknown)               Expected Discharge Plan:  Skilled Nursing Facility  In-House Referral:  Clinical Social Work  Discharge planning Services  CM Consult  Post Acute Care Choice:    Choice offered to:     DME Arranged:    DME Agency:     HH Arranged:    [place] Agency:     Status of Service:  In process, will continue to follow  If discussed at Long Length of Stay Meetings, dates discussed:    Additional Comments:  Dessa Phi, RN 02/27/2018, 1:46 PM

## 2018-02-27 NOTE — Consult Note (Signed)
Radiation Oncology         [phone] ________________________________  Name: Robert Compton        MRN: [id number]  Date of Service: 02/27/18 DOB: Mar 14, 1946  [id number], Ravisankar, MD  No ref. provider found     REFERRING PHYSICIAN: Dr. Rowe Pavy  DIAGNOSIS: The primary encounter diagnosis was AKI (acute kidney injury) ([place]). Diagnoses of HCAP (healthcare-associated pneumonia), ARF (acute renal failure) ([place]), Ascites, and Other ascites were also pertinent to this visit.   HISTORY OF PRESENT ILLNESS: Robert Compton is a 72 y.o. male seen at the request of Dr. Rowe Pavy for a known diagnosis of extensive stage small cell lung cancer.  Patient was originally diagnosed with metastatic disease in the liver and bone in June 2019.  He was given carbotaxol and etoposide with a partial response, he was recently counseled on plans to consider Tecentriq immunotherapy but his insurance has denied this.  [name] does not feel that he is a candidate for additional systemic therapy otherwise, and has asked for palliative care to discuss goals of care with the anticipation of electing for hospice care.  Palliative care met with the patient, and in discussing his symptoms, complained of back pain.  In reviewing his imaging, he did have diffuse skeletal disease and uptake on PET back in July, there were changes seen as sclerotic on his CT scan on February 14, 2018, and no evidence of cord compression was present dating back from July or his more recent scan on the 12th.  He has however had 3 paracenteses to address fluid in his abdomen.  He has had a total of 11 L drained since 02/21/2018.  He has had prior paracenteses as well in September, apparently the patient was under the impression that there was no cancer going on in his body but I think that what was miscommunicated was that despite his paracenteses he has not had cytologic evidence of cancer cells within his ascitic fluid however is still felt that this fluid  accumulation is due to the cancer.  We are asked to see the patient to consider palliative radiotherapy to the spine.  Of note his liver is also a site of metastatic disease, but does not appear to have a dominant lesion that would contribute to abdominal pain.    PREVIOUS RADIATION THERAPY: No   PAST MEDICAL HISTORY:  Past Medical History:  Diagnosis Date  . Arthritis   . Cancer ([place])   . Diabetes mellitus without complication ([place])   . Hypertension        PAST SURGICAL HISTORY: Past Surgical History:  Procedure Laterality Date  . HERNIA REPAIR    . JOINT REPLACEMENT Right    knee  . LIVER BIOPSY       FAMILY HISTORY:  Family History  Problem Relation Age of Onset  . Diabetes Mother   . Dementia Mother   . Colon cancer Father   . CAD Neg Hx      SOCIAL HISTORY:  reports that he quit smoking about 5 months ago. His smoking use included cigarettes. He has never used smokeless tobacco. He reports that he does not drink alcohol or use drugs.  The patient is single and lives in [place].  His daughter [name] helps him make medical decisions.   ALLERGIES: Amoxicillin-pot clavulanate and Nitrofurantoin   MEDICATIONS:  Current Facility-Administered Medications  Medication Dose Route Frequency Provider Last Rate Last Dose  . 0.9 %  sodium chloride infusion   Intravenous Continuous Irine Seal  V, MD 125 mL/hr at 02/27/18 1227    . acetaminophen (TYLENOL) tablet 650 mg  650 mg Oral Q6H PRN Rise Patience, MD       Or  . acetaminophen (TYLENOL) suppository 650 mg  650 mg Rectal Q6H PRN Rise Patience, MD      . aztreonam (AZACTAM) 1 g in sodium chloride 0.9 % 100 mL IVPB  1 g Intravenous Q8H Lenis Noon, RPH 200 mL/hr at 02/27/18 0453 1 g at 02/27/18 0453  . carvedilol (COREG) tablet 3.125 mg  3.125 mg Oral BID WC Rise Patience, MD   3.125 mg at 02/27/18 0817  . Chlorhexidine Gluconate Cloth 2 % PADS 6 each  6 each Topical Q0600 Eugenie Filler,  MD   6 each at 02/27/18 [phone]  . HYDROcodone-acetaminophen (NORCO) 7.5-325 MG per tablet 1 tablet  1 tablet Oral Q6H PRN Rise Patience, MD   1 tablet at 02/26/18 2346  . insulin aspart (novoLOG) injection 0-9 Units  0-9 Units Subcutaneous TID WC Rise Patience, MD   2 Units at 02/27/18 1225  . mupirocin ointment (BACTROBAN) 2 % 1 application  1 application Nasal BID Eugenie Filler, MD   1 application at 26/37/85 0920  . omega-3 acid ethyl esters (LOVAZA) capsule 1 g  1 g Oral QHS Rise Patience, MD   1 g at 02/26/18 2043  . ondansetron (ZOFRAN) tablet 4 mg  4 mg Oral Q6H PRN Rise Patience, MD       Or  . ondansetron [facility]) injection 4 mg  4 mg Intravenous Q6H PRN Rise Patience, MD   4 mg at 02/26/18 1137  . pantoprazole (PROTONIX) EC tablet 40 mg  40 mg Oral Daily Rise Patience, MD   40 mg at 02/27/18 0920  . vancomycin variable dose per unstable renal function (pharmacist dosing)   Does not apply See admin instructions Lenis Noon, [facility]      . vitamin B-12 (CYANOCOBALAMIN) tablet 100 mcg  100 mcg Oral Daily Rise Patience, MD   100 mcg at 02/27/18 0920     REVIEW OF SYSTEMS: On review of systems, the patient reports that he is doing okay overall.  He reports that he started having pain in the upper part of his low back overnight, he states that this comes on like a shot sharp stabbing pain, and states that he has had this previously, and has a known history of sciatic back pain.  He is unable to describe whether this is significantly different than normal, and states that the pain medicine has been quite helpful that he is received while he is been in the hospital for this.  He states that he just feels quite fatigued and poorly overall. He denies any chest pain, shortness of breath, cough, fevers, chills, night sweats.  He states he is not feeling as nauseated since he does have the fluid removed with paracentesis.  No other new joint pain or aches  are noted.  A complete review of systems is obtained and is otherwise negative.    PHYSICAL EXAM:  Wt Readings from Last 3 Encounters:  02/27/18 263 lb 14.3 oz (119.7 kg)  02/17/18 285 lb 6.4 oz (129.5 kg)  01/20/18 267 lb 3.2 oz (121.2 kg)   Temp Readings from Last 3 Encounters:  02/27/18 97.7 F (36.5 C) (Oral)  02/24/18 97.8 F (36.6 C) (Oral)  02/17/18 97.6 F (36.4 C) (Oral)   BP  Readings from Last 3 Encounters:  02/27/18 103/65  02/24/18 96/60  02/21/18 118/66   Pulse Readings from Last 3 Encounters:  02/27/18 96  02/24/18 76  02/17/18 92   Pain Assessment Pain Score: 0-No pain/10  In general this is a chronically ill-appearing Caucasian male in no acute distress.  He's alert and oriented x4 and appropriate throughout the examination. Cardiopulmonary assessment is negative for acute distress and he exhibits normal effort.  He points to the small of the low back as the site of his pain at the midline.   ECOG = 3  0 - Asymptomatic (Fully active, able to carry on all predisease activities without restriction)  1 - Symptomatic but completely ambulatory (Restricted in physically strenuous activity but ambulatory and able to carry out work of a light or sedentary nature. For example, light housework, office work)  2 - Symptomatic, <50% in bed during the day (Ambulatory and capable of all self care but unable to carry out any work activities. Up and about more than 50% of waking hours)  3 - Symptomatic, >50% in bed, but not bedbound (Capable of only limited self-care, confined to bed or chair 50% or more of waking hours)  4 - Bedbound (Completely disabled. Cannot carry on any self-care. Totally confined to bed or chair)  5 - Death   Eustace Pen MM, Creech RH, Tormey DC, et al. [phone]). "Toxicity and response criteria of the [facility] Group". [place] Oncol. 5 (6): 649-55    LABORATORY DATA:  Lab Results  Component Value Date   WBC 6.8 02/27/2018    HGB 9.3 (L) 02/27/2018   HCT 28.6 (L) 02/27/2018   MCV 109.2 (H) 02/27/2018   PLT 41 (L) 02/27/2018   Lab Results  Component Value Date   NA 137 02/27/2018   K 4.3 02/27/2018   CL 104 02/27/2018   CO2 22 02/27/2018   Lab Results  Component Value Date   ALT 33 02/25/2018   AST 62 (H) 02/25/2018   ALKPHOS 115 02/25/2018   BILITOT 2.7 (H) 02/25/2018      RADIOGRAPHY: Ct Abdomen Pelvis Wo Contrast  Result Date: 02/25/2018 CLINICAL DATA:  Generalized weakness. Back pain. Stage IV lung cancer, spread the liver. Receiving chemotherapy. EXAM: CT ABDOMEN AND PELVIS WITHOUT CONTRAST TECHNIQUE: Multidetector CT imaging of the abdomen and pelvis was performed following the standard protocol without IV contrast. COMPARISON:  February 14, 2018 FINDINGS: Lower chest: No suspicious findings in the lung bases. Hepatobiliary: The patient has known liver lesions are not as well seen without contrast. The largest is in the right hepatic lobe on series 8, image 27. Mildly nodular contour to the liver suggesting cirrhosis. The gallbladder is unremarkable. Pancreas: Unremarkable. No pancreatic ductal dilatation or surrounding inflammatory changes. Spleen: Normal in size without focal abnormality. Adrenals/Urinary Tract: Adrenal glands are normal. There is a cyst in the upper pole the left kidney, unchanged. The kidneys, ureters, and bladder are otherwise normal. Stomach/Bowel: The stomach and small bowel are normal. The colon is unremarkable. The appendix is not visualized but there is no secondary evidence of appendicitis. Vascular/Lymphatic: Atherosclerotic changes are seen in the nonaneurysmal aorta. No adenopathy. Reproductive: Prostate is unremarkable. Other: Marked ascites in the abdomen and pelvis is similar in the interval. No free air. Musculoskeletal: Sclerotic lesion in L2 on series 6, image 69, stable in the interval. Mild sclerosis along the superior endplate of L1 on image 69 as well. Sclerotic focus  in T9 on image 69  also. IMPRESSION: 1. Diffuse ascites, unchanged. 2. Cirrhosis. 3. Known lesions in the liver are more difficult to evaluate without contrast but not definitely changed. 4. Scattered sclerotic lesions in the lumbar and thoracic spine are nonspecific but may be metastatic given history of lung cancer. 5. Atherosclerotic changes in the nonaneurysmal aorta. Electronically Signed   By: Dorise Bullion III M.D   On: 02/25/2018 21:29   Dg Chest 2 View  Result Date: 02/25/2018 CLINICAL DATA:  Productive cough with fatigue and abdominal distention EXAM: CHEST - 2 VIEW COMPARISON:  02/14/2018 abdominal CT FINDINGS: Hazy opacity in the right mid lung not seen on chest CT 11/26/2017. Normal heart size and mediastinal contours. No effusion or pneumothorax. Low lung volumes. IMPRESSION: Summation shadows versus subtle opacity in the right mid lung. This could be a focus of infection or metastatic disease. Electronically Signed   By: Monte Fantasia M.D.   On: 02/25/2018 19:17   Ct Chest W Contrast  Result Date: 02/14/2018 CLINICAL DATA:  "extensive stage small cell lung cancer presented with right upper lobe lung mass in addition to pulmonary nodules, mediastinal lymphadenopathy and extensive liver metastasis in June 2019. EXAM: CT CHEST, ABDOMEN, AND PELVIS WITH CONTRAST TECHNIQUE: Multidetector CT imaging of the chest, abdomen and pelvis was performed following the standard protocol during bolus administration of intravenous contrast. CONTRAST:  158m OMNIPAQUE IOHEXOL 300 MG/ML  SOLN COMPARISON:  CT abdomen 11/08/2017, chest CT 11/26/2017, PET-CT 10/21/2017 FINDINGS: CT CHEST FINDINGS Cardiovascular: Coronary artery calcification and aortic atherosclerotic calcification. Mediastinum/Nodes: No axillary or supraclavicular adenopathy. RIGHT lower paratracheal lymph node measuring 12 mm compares to 22 mm on comparison PET-CT scan. No new adenopathy. Lungs/Pleura: Linear thickening in the RIGHT upper lobe  at site of prior nodule. No new nodularity Two nodules along the lower RIGHT horizontal fissure (image 30/2) unchanged. Musculoskeletal: Diffuse smudgy sclerosis within the vertebral bodies. CT ABDOMEN AND PELVIS FINDINGS Hepatobiliary: Cluster low-density lesions in the posterior RIGHT hepatic lobe not changed. Several small lesions in the more central RIGHT hepatic lobe are more conspicuous than prior including 13 mm lesion on image 57/2. Several small superior nodules are similar. The liver has a nodular contour New ascites throughout the abdomen and pelvis.  Gallbladder normal Pancreas: Pancreas normal Spleen: Normal spleen Adrenals/urinary tract: Adrenal glands normal. No worrisome hepatic lesion. Fat containing lesion in the LEFT kidney consistent with angiomyolipoma. Ureters and bladder normal Stomach/Bowel: Stomach, small bowel, appendix, and cecum are normal. The colon and rectosigmoid colon are normal. Vascular/Lymphatic: Abdominal aorta is normal caliber with atherosclerotic calcification. There is no retroperitoneal or periportal lymphadenopathy. No pelvic lymphadenopathy. Reproductive: Prostate normal Other: Large volume intraperitoneal free fluid. Musculoskeletal: Diffuse smudgy sclerosis of the bones concerning for widespread skeletal metastasis. Increased sclerosis in the lumbar spine compared to comparison CT 11/08/2017. IMPRESSION: Chest Impression: 1. Linear thickening in the RIGHT upper lobe at site of prior nodular lesion. 2. Mild mediastinal adenopathy similar to most recent CT scan of 11/26/2017 and improved from PET-CT scan of 10/21/2017 Abdomen / Pelvis Impression: 1. Nodule liver consistent with cirrhosis. New extensive ASCITES in the abdomen pelvis. 2. Multiple low-density lesions in the posterior RIGHT hepatic lobe are unchanged. Several new lesions in the central liver concerning for metastatic deposits. Consider MRI with and without contrast for further evaluation. 3. Smudgy  calcifications within the lumbar spine indeterminate but concerning for metastatic skeletal disease. Consider MRI of the lumbar spine (FDG PET CT not advised on patient with Neulasta support). Electronically Signed   By:  Suzy Bouchard M.D.   On: 02/14/2018 15:13   Ct Abdomen Pelvis W Contrast  Result Date: 02/14/2018 CLINICAL DATA:  "extensive stage small cell lung cancer presented with right upper lobe lung mass in addition to pulmonary nodules, mediastinal lymphadenopathy and extensive liver metastasis in June 2019. EXAM: CT CHEST, ABDOMEN, AND PELVIS WITH CONTRAST TECHNIQUE: Multidetector CT imaging of the chest, abdomen and pelvis was performed following the standard protocol during bolus administration of intravenous contrast. CONTRAST:  152m OMNIPAQUE IOHEXOL 300 MG/ML  SOLN COMPARISON:  CT abdomen 11/08/2017, chest CT 11/26/2017, PET-CT 10/21/2017 FINDINGS: CT CHEST FINDINGS Cardiovascular: Coronary artery calcification and aortic atherosclerotic calcification. Mediastinum/Nodes: No axillary or supraclavicular adenopathy. RIGHT lower paratracheal lymph node measuring 12 mm compares to 22 mm on comparison PET-CT scan. No new adenopathy. Lungs/Pleura: Linear thickening in the RIGHT upper lobe at site of prior nodule. No new nodularity Two nodules along the lower RIGHT horizontal fissure (image 30/2) unchanged. Musculoskeletal: Diffuse smudgy sclerosis within the vertebral bodies. CT ABDOMEN AND PELVIS FINDINGS Hepatobiliary: Cluster low-density lesions in the posterior RIGHT hepatic lobe not changed. Several small lesions in the more central RIGHT hepatic lobe are more conspicuous than prior including 13 mm lesion on image 57/2. Several small superior nodules are similar. The liver has a nodular contour New ascites throughout the abdomen and pelvis.  Gallbladder normal Pancreas: Pancreas normal Spleen: Normal spleen Adrenals/urinary tract: Adrenal glands normal. No worrisome hepatic lesion. Fat  containing lesion in the LEFT kidney consistent with angiomyolipoma. Ureters and bladder normal Stomach/Bowel: Stomach, small bowel, appendix, and cecum are normal. The colon and rectosigmoid colon are normal. Vascular/Lymphatic: Abdominal aorta is normal caliber with atherosclerotic calcification. There is no retroperitoneal or periportal lymphadenopathy. No pelvic lymphadenopathy. Reproductive: Prostate normal Other: Large volume intraperitoneal free fluid. Musculoskeletal: Diffuse smudgy sclerosis of the bones concerning for widespread skeletal metastasis. Increased sclerosis in the lumbar spine compared to comparison CT 11/08/2017. IMPRESSION: Chest Impression: 1. Linear thickening in the RIGHT upper lobe at site of prior nodular lesion. 2. Mild mediastinal adenopathy similar to most recent CT scan of 11/26/2017 and improved from PET-CT scan of 10/21/2017 Abdomen / Pelvis Impression: 1. Nodule liver consistent with cirrhosis. New extensive ASCITES in the abdomen pelvis. 2. Multiple low-density lesions in the posterior RIGHT hepatic lobe are unchanged. Several new lesions in the central liver concerning for metastatic deposits. Consider MRI with and without contrast for further evaluation. 3. Smudgy calcifications within the lumbar spine indeterminate but concerning for metastatic skeletal disease. Consider MRI of the lumbar spine (FDG PET CT not advised on patient with Neulasta support). Electronically Signed   By: SSuzy BouchardM.D.   On: 02/14/2018 15:13   [place]al  Result Date: 02/26/2018 CLINICAL DATA:  Acute renal failure EXAM: RENAL / URINARY TRACT ULTRASOUND COMPLETE COMPARISON:  CT abdomen pelvis 02/25/2018 FINDINGS: Right Kidney: Renal measurements: 11.3 x 4.7 x 6.2 cm = volume: 174 mL . Echogenicity within normal limits. No mass or hydronephrosis visualized. Left Kidney: Renal measurements: 11.6 x 5.7 x 6.5 cm = volume: 223 mL. Echogenicity within normal limits. No mass or hydronephrosis  visualized. Left upper pole cyst 2.5 cm Bladder: Bladder empty with Foley catheter.  Moderate ascites in the pelvis IMPRESSION: Sonographically normal kidneys. Moderate ascites. Electronically Signed   By: CFranchot GalloM.D.   On: 02/26/2018 11:20   [place]orea[place]acentesis  Result Date: 02/26/2018 INDICATION: Patient with history of small cell lung cancer, recurrent ascites. Request made for diagnostic and therapeutic paracentesis. EXAM: ULTRASOUND  GUIDED DIAGNOSTIC AND THERAPEUTIC PARACENTESIS MEDICATIONS: None COMPLICATIONS: None immediate. PROCEDURE: Informed written consent was obtained from the patient after a discussion of the risks, benefits and alternatives to treatment. A timeout was performed prior to the initiation of the procedure. Initial ultrasound scanning demonstrates a large amount of ascites within the left lower abdominal quadrant. The left lower abdomen was prepped and draped in the usual sterile fashion. 1% lidocaine was used for local anesthesia. Following this, a 19 gauge, 10-cm, Yueh catheter was introduced. An ultrasound image was saved for documentation purposes. The paracentesis was performed. The catheter was removed and a dressing was applied. The patient tolerated the procedure well without immediate post procedural complication. FINDINGS: A total of approximately 8.4 liters of amber/blood-tinged fluid was removed. Samples were sent to the laboratory as requested by the clinical team. IMPRESSION: Successful ultrasound-guided diagnostic and therapeutic paracentesis yielding 8.4 liters of peritoneal fluid. Read by: Rowe Josimar, PA-C Electronically Signed   By: Corrie Mckusick D.O.   On: 02/26/2018 15:47   US Paracentesis  Result Date: 02/21/2018 INDICATION: Patient with history of small cell lung cancer, recurrent ascites. Request made for diagnostic and therapeutic paracentesis. EXAM: ULTRASOUND GUIDED DIAGNOSTIC AND THERAPEUTIC PARACENTESIS MEDICATIONS: None COMPLICATIONS: None  immediate. PROCEDURE: Informed written consent was obtained from the patient after a discussion of the risks, benefits and alternatives to treatment. A timeout was performed prior to the initiation of the procedure. Initial ultrasound scanning demonstrates a large amount of ascites within the left lower abdominal quadrant. The left lower abdomen was prepped and draped in the usual sterile fashion. 1% lidocaine was used for local anesthesia. Following this, a 19 gauge, 10-cm, Yueh catheter was introduced. An ultrasound image was saved for documentation purposes. The paracentesis was performed. The catheter was removed and a dressing was applied. The patient tolerated the procedure well without immediate post procedural complication. FINDINGS: A total of approximately 11.4 liters of yellow fluid was removed. Samples were sent to the laboratory as requested by the clinical team. IMPRESSION: Successful ultrasound-guided diagnostic and therapeutic paracentesis yielding 11.4 liters of peritoneal fluid. Read by: Rowe Raef, PA-C Electronically Signed   By: Corrie Mckusick D.O.   On: 02/21/2018 13:14       IMPRESSION/PLAN: 1. Extensive Stage Small Cell Carcinoma of the lung with metastatic disease in the liver and bone.  I met with patient and spoke with his daughter as well.  The patient is a chronically ill man with significant medical problems in addition to the fact that he has had electrolyte abnormalities as a result of treatment and fluid shifts due to his cancer diagnosis.  While he has been experiencing back pain.  It is difficult to elicit whether this is truly been worsened in the recent past as a result of cancer, or if it is stable from his known history of arthritic changes in the low back.  I spent time talking with both of them about the fact that radiation and the palliative form to the spine could be of benefit however the benefit could still be quite modest being that the disease is described as  sclerotic on his imaging.  We discussed the delivery and logistics of radiotherapy and Dr. Lisbeth Renshaw would offer a course of 5 fractions over 1 week if we were to consider this.  We would begin this on Monday of next week, and we discussed the risks, benefits, short and long-term effects of radiotherapy.  At the conclusion of our visit, the patient and his  daughter would like to hold off on any radiotherapy treatment at this time. We discussed that we could always revisit this discussion if his symptoms progressed.   In a visit lasting 70 minutes, greater than 50% of the time was spent face to face discussing his case and in floor time coordinating the patient's care.    Carola Rhine, PAC

## 2018-02-27 NOTE — Progress Notes (Signed)
PROGRESS NOTE    Robert Compton  KXF:[id number] DOB: 17-Jun-1945 DOA: 02/25/2018 PCP: [name], MD    Brief Narrative:  Patient is a 72 year old male history of extensive stage small cell lung cancer started on immunotherapy, history of hypertension, diabetes type 2 presented to the ED with increasing weakness and increasing abdominal distention.  Patient noted to be in acute renal failure, hyperkalemic with a potassium of 6, chest x-ray showing possible infiltrates.  Patient admitted.  Placed on IV fluids.  Ultrasound-guided paracentesis ordered.  Oncology consulted.   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  Creatinine on admission was 4.03 from 1.20 on 02/14/2018.  Patient per admitting physician noted to have some episodes of diarrhea.  Blood pressure borderline.  Urine sodium was less than 10.  Renal ultrasound which was done with sonographically normal kidneys and moderate ascites.  Patient status post paracentesis.  Renal function trending down creatinine currently at 3.32.  Continue IV fluids at 125 cc/h.  Follow.   2.  Hyperkalemia Likely secondary to problem #1 versus tumor lysis syndrome as patient noted to have an elevated uric acid level and elevated phosphorus level.  Status post 1 dose of Kayexalate.  Status post IV rasburicase.  Hyperkalemia resolved.  Potassium at 4.3 this morning.   Follow.  3.  Probable tumor lysis syndrome Patient had presented with acute renal failure noted to be hyperkalemic with a potassium as high as 6.  Uric acid level elevated as well as phosphorus level.  Likely tumor lysis syndrome.  Discussed with oncology.  Status post IV rasburicase.  Patient also on IV fluids.  Patient received a dose of Kayexalate.  Potassium today is 4.3, phosphorus of 4.1 uric acid of 4.2.  Continue supportive care.  Follow.   4.  Hypertension Continue Coreg and monitor blood pressure closely.  Follow.  5.  Chronic diastolic CHF Currently stable.  Euvolemic.  Diuretics on hold secondary to problem #1.  Monitor closely with gentle hydration per  6.  Large ascites Patient states had recent paracentesis done 5 days prior to admission with 11 L removed.  Patient with abdominal distention.  Likely secondary to metastatic small cell lung cancer.  Ultrasound-guided therapeutic thoracentesis done on 02/26/2018 with 8.4 L removed and fluid sent for cytology.  Continue IV albumin.  Will likely need PRN therapeutic paracentesis in the future. Follow.  7.  Diabetes mellitus type 2 Hemoglobin A1c was 7.2 on 09/28/2017.  CBG of 141 this morning.  Continued to hold oral hypoglycemic agents.  Continue sliding scale insulin.  8.  Possible pneumonia/HCAP Noted on chest x-ray.  Patient currently afebrile however patient immunocompromised.  Continue empiric IV aztreonam and IV vancomycin and treat for 5-7 days empirically.  If blood cultures are still negative to date tomorrow we will discontinue IV vancomycin.  Supportive care.  9.  Severe protein calorie malnutrition Secondary to metastatic small cell lung cancer.  Patient with poor prognosis.  10.  Anemia/thrombocytopenia Likely secondary to malignancy and chemotherapy.  Patient with no overt bleeding.  Hemoglobin currently at 9.3.  Transfusion threshold hemoglobin less than 7 and platelet count less than 10.  Oncology following.  11.   Extensive stage small cell lung cancer with right upper lobe mass Patient with some pulmonary nodules and significant mediastinal lymphadenopathy and liver mets diagnosed in June 2019.  Patient status post 5 cycles of chemotherapy and patient noted to be on maintenance treatment with single agent tecentriq first dose was February 24, 2018.  Patient with a poor prognosis felt per oncology and recommending palliative care.  Palliative care consultation for goals of care pending.   DVT prophylaxis: SCDs Code Status: Full Family Communication: Updated patient.  No family at bedside. Disposition Plan: To be determined.  Palliative care consultation pending.  PT evaluation pending.   Consultants:   Oncology: Dr. Lorna Few 02/26/2018  Palliative care consultation pending Dr. Rowe Pavy 02/27/2018.  Procedures:   Ultrasound-guided paracentesis pending 02/26/2018  CT abdomen and pelvis 02/25/2018  Chest x-ray 02/25/2018    Antimicrobials:  IV aztreonam 02/25/2018  IV vancomycin 02/25/2018   Subjective: Patient laying in bed.  Patient denies any chest pain.  No shortness of breath.  Patient states felt better after paracentesis yesterday which yielded 8.4 L of amber/blood-tinged fluid.  Patient still with weakness.    Objective: Vitals:   02/26/18 2105 02/26/18 2331 02/27/18 0451 02/27/18 0458  BP: 110/65 108/61 103/65   Pulse: 93  96   Resp: 20  18   Temp: 98.4 F (36.9 C)  97.7 F (36.5 C)   TempSrc: Oral  Oral   SpO2: 98%  92%   Weight:    119.7 kg  Height:        Intake/Output Summary (Last 24 hours) at 02/27/2018 1126 Last data filed at 02/27/2018 1002 Gross per 24 hour  Intake 2390 ml  Output 875 ml  Net 1515 ml   Filed Weights   02/25/18 2223 02/26/18 0514 02/27/18 0458  Weight: 123.7 kg 124.9 kg 119.7 kg    Examination:  General exam: NAD Respiratory system: Lungs clear to auscultation bilaterally.  No wheezes, no crackles, no rhonchi.  Respiratory  effort normal. Cardiovascular system: Regular rate rhythm no murmurs rubs or gallops.  No JVD.  No lower extremity edema.  Gastrointestinal system: Abdomen is distended, soft, nontender to palpation, positive bowel sounds.  No rebound.  No guarding.  Central nervous system: Alert and oriented. No focal neurological deficits. Extremities: Symmetric 5 x 5 power. Skin: No rashes, lesions or ulcers Psychiatry: Judgement and insight appear normal. Mood & affect flat.     Data Reviewed: I have personally reviewed following labs and imaging studies  CBC: Recent Labs  Lab 02/24/18 0939 02/25/18 1708 02/26/18 0525 02/27/18 0549  WBC 9.9 9.8 8.7 6.8  NEUTROABS 7.7  --   --  5.1  HGB 10.8* 11.0* 10.0* 9.3*  HCT 32.5* 33.4* 30.4* 28.6*  MCV 104.5* 105.0* 107.0* 109.2*  PLT 55* 51* 49* 41*   Basic Metabolic Panel: Recent Labs  Lab 02/24/18 0939 02/25/18 1708 02/26/18 0525 02/26/18 0812 02/26/18 1757 02/27/18 0549  NA 134* 133* 133* 134*  --  137  K 5.9* 6.0* 6.0* 5.9* 5.3* 4.3  CL 101 101 102 101  --  104  CO2 20* 19* 21* 22  --  22  GLUCOSE 152* 146* 170* 158*  --  180*  BUN 39* 49* 53* 52*  --  46*  CREATININE  3.99* 4.03* 3.89* 3.68*  --  3.32*  CALCIUM 8.8* 8.2* 8.5* 8.6*  --  8.3*  PHOS  --   --   --  4.8*  --  4.1   GFR: Estimated Creatinine Clearance: 28.9 mL/min (A) (by C-G formula based on SCr of 3.32 mg/dL (H)). Liver Function Tests: Recent Labs  Lab 02/24/18 0939 02/25/18 1708  AST 55* 62*  ALT 31 33  ALKPHOS 126 115  BILITOT 2.5* 2.7*  PROT 6.7 6.2*  ALBUMIN 2.0* 2.0*   Recent Labs  Lab 02/25/18 1708  LIPASE 41   Recent Labs  Lab 02/25/18 2258  AMMONIA 55*   Coagulation Profile: Recent Labs  Lab 02/25/18 1708  INR 1.25   Cardiac Enzymes: No results for input(s): CKTOTAL, CKMB, CKMBINDEX, TROPONINI in the last 168 hours. BNP (last 3 results) No results for input(s): PROBNP in the last 8760 hours. HbA1C: No results for input(s): HGBA1C in  the last 72 hours. CBG: Recent Labs  Lab 02/26/18 0729 02/26/18 1216 02/26/18 1648 02/26/18 2119 02/27/18 0800  GLUCAP 149* 197* 157* 135* 141*   Lipid Profile: No results for input(s): CHOL, HDL, LDLCALC, TRIG, CHOLHDL, LDLDIRECT in the last 72 hours. Thyroid Function Tests: No results for input(s): TSH, T4TOTAL, FREET4, T3FREE, THYROIDAB in the last 72 hours. Anemia Panel: No results for input(s): VITAMINB12, FOLATE, FERRITIN, TIBC, IRON, RETICCTPCT in the last 72 hours. Sepsis Labs: Recent Labs  Lab 02/25/18 1819  LATICACIDVEN 3.42*    Recent Results (from the past 240 hour(s))  Urine culture     Status: None   Collection Time: 02/25/18  6:34 PM  Result Value Ref Range Status   Specimen Description   Final    URINE, CLEAN CATCH Performed at [facility] [address] [place], [place] [address]    Special Requests   Final    NONE Performed at [facility], [place] [address] [place], [place] [address]    Culture   Final    NO GROWTH Performed at [facility], [place] [address] [place], [place] [address]    Report Status 02/27/2018 FINAL  Final  Blood culture (routine x 2)     Status: None (Preliminary result)   Collection Time: 02/25/18  8:13 PM  Result Value Ref Range Status   Specimen Description   Final    BLOOD LEFT WRIST Performed at [facility], [place] [address], [place], [place] [address]    Special Requests   Final    BOTTLES DRAWN AEROBIC AND ANAEROBIC Blood Culture adequate volume Performed at [facility] [address] [place], [place] [address]    Culture   Final    NO GROWTH 1 DAY Performed at [facility], [place] [address], [place], [place] [address]    Report Status PENDING  Incomplete  Blood culture (routine x 2)     Status: None (Preliminary result)   Collection Time: 02/25/18 10:58 PM  Result Value Ref Range Status   Specimen Description   Final    BLOOD LEFT  HAND Performed at [facility], [place] [address], [place], [place] [address]    Special Requests   Final    AEROBIC BOTTLE ONLY Blood Culture results may not be optimal due to an inadequate volume of blood received in culture bottles Performed at [facility] [address] [place], [place] [address]    Culture   Final  NO GROWTH 1 DAY Performed at [facility], [place] [address], [place], [place] [address]    Report Status PENDING  Incomplete  MRSA PCR Screening     Status: Abnormal   Collection Time: 02/26/18  5:08 AM  Result Value Ref Range Status   MRSA by PCR POSITIVE (A) NEGATIVE Final    Comment:        The GeneXpert MRSA Assay (FDA approved for NASAL specimens only), is one component of a comprehensive MRSA colonization surveillance program. It is not intended to diagnose MRSA infection nor to guide or monitor treatment for MRSA infections. RESULT CALLED TO, READ BACK BY AND VERIFIED WITH: CAMPBELL,M RN AT 6237 02/26/18 BY TIBBITTS,K Performed at [facility], [place] [address] [place], [place] [address]          Radiology Studies: Ct Abdomen Pelvis Wo Contrast  Result Date: 02/25/2018 CLINICAL DATA:  Generalized weakness. Back pain. Stage IV lung cancer, spread the liver. Receiving chemotherapy. EXAM: CT ABDOMEN AND PELVIS WITHOUT CONTRAST TECHNIQUE: Multidetector CT imaging of the abdomen and pelvis was performed following the standard protocol without IV contrast. COMPARISON:  February 14, 2018 FINDINGS: Lower chest: No suspicious findings in the lung bases. Hepatobiliary: The patient has known liver lesions are not as well seen without contrast. The largest is in the right hepatic lobe on series 8, image 27. Mildly nodular contour to the liver suggesting cirrhosis. The gallbladder is unremarkable. Pancreas: Unremarkable. No pancreatic ductal dilatation or surrounding inflammatory changes. Spleen: Normal in size without  focal abnormality. Adrenals/Urinary Tract: Adrenal glands are normal. There is a cyst in the upper pole the left kidney, unchanged. The kidneys, ureters, and bladder are otherwise normal. Stomach/Bowel: The stomach and small bowel are normal. The colon is unremarkable. The appendix is not visualized but there is no secondary evidence of appendicitis. Vascular/Lymphatic: Atherosclerotic changes are seen in the nonaneurysmal aorta. No adenopathy. Reproductive: Prostate is unremarkable. Other: Marked ascites in the abdomen and pelvis is similar in the interval. No free air. Musculoskeletal: Sclerotic lesion in L2 on series 6, image 69, stable in the interval. Mild sclerosis along the superior endplate of L1 on image 69 as well. Sclerotic focus in T9 on image 69 also. IMPRESSION: 1. Diffuse ascites, unchanged. 2. Cirrhosis. 3. Known lesions in the liver are more difficult to evaluate without contrast but not definitely changed. 4. Scattered sclerotic lesions in the lumbar and thoracic spine are nonspecific but may be metastatic given history of lung cancer. 5. Atherosclerotic changes in the nonaneurysmal aorta. Electronically Signed   By: Dorise Bullion III M.D   On: 02/25/2018 21:29   Dg Chest 2 View  Result Date: 02/25/2018 CLINICAL DATA:  Productive cough with fatigue and abdominal distention EXAM: CHEST - 2 VIEW COMPARISON:  02/14/2018 abdominal CT FINDINGS: Hazy opacity in the right mid lung not seen on chest CT 11/26/2017. Normal heart size and mediastinal contours. No effusion or pneumothorax. Low lung volumes. IMPRESSION: Summation shadows versus subtle opacity in the right mid lung. This could be a focus of infection or metastatic disease. Electronically Signed   By: Monte Fantasia M.D.   On: 02/25/2018 19:17   US Renal  Result Date: 02/26/2018 CLINICAL DATA:  Acute renal failure EXAM: RENAL / URINARY TRACT ULTRASOUND COMPLETE COMPARISON:  CT abdomen pelvis 02/25/2018 FINDINGS: Right Kidney: Renal  measurements: 11.3 x 4.7 x 6.2 cm = volume: 174 mL . Echogenicity within normal limits. No mass or hydronephrosis visualized. Left Kidney: Renal  measurements: 11.6 x 5.7 x 6.5 cm = volume: 223 mL. Echogenicity within normal limits. No mass or hydronephrosis visualized. Left upper pole cyst 2.5 cm Bladder: Bladder empty with Foley catheter.  Moderate ascites in the pelvis IMPRESSION: Sonographically normal kidneys. Moderate ascites. Electronically Signed   By: Franchot Gallo M.D.   On: 02/26/2018 11:20   US Paracentesis  Result Date: 02/26/2018 INDICATION: Patient with history of small cell lung cancer, recurrent ascites. Request made for diagnostic and therapeutic paracentesis. EXAM: ULTRASOUND GUIDED DIAGNOSTIC AND THERAPEUTIC PARACENTESIS MEDICATIONS: None COMPLICATIONS: None immediate. PROCEDURE: Informed written consent was obtained from the patient after a discussion of the risks, benefits and alternatives to treatment. A timeout was performed prior to the initiation of the procedure. Initial ultrasound scanning demonstrates a large amount of ascites within the left lower abdominal quadrant. The left lower abdomen was prepped and draped in the usual sterile fashion. 1% lidocaine was used for local anesthesia. Following this, a 19 gauge, 10-cm, Yueh catheter was introduced. An ultrasound image was saved for documentation purposes. The paracentesis was performed. The catheter was removed and a dressing was applied. The patient tolerated the procedure well without immediate post procedural complication. FINDINGS: A total of approximately 8.4 liters of amber/blood-tinged fluid was removed. Samples were sent to the laboratory as requested by the clinical team. IMPRESSION: Successful ultrasound-guided diagnostic and therapeutic paracentesis yielding 8.4 liters of peritoneal fluid. Read by: Rowe Lofton, PA-C Electronically Signed   By: Corrie Mckusick D.O.   On: 02/26/2018 15:47        Scheduled Meds: .  carvedilol  3.125 mg Oral BID WC  . Chlorhexidine Gluconate Cloth  6 each Topical Q0600  . insulin aspart  0-9 Units Subcutaneous TID WC  . mupirocin ointment  1 application Nasal BID  . omega-3 acid ethyl esters  1 g Oral QHS  . pantoprazole  40 mg Oral Daily  . vancomycin variable dose per unstable renal function (pharmacist dosing)   Does not apply See admin instructions  . vitamin B-12  100 mcg Oral Daily   Continuous Infusions: . sodium chloride 125 mL/hr at 02/27/18 0454  . aztreonam 1 g (02/27/18 0453)     LOS: 2 days    Time spent: 40 minutes    Irine Seal, MD [facility] Pager [phone]  If 7PM-7AM, please contact night-coverage [email or web address] Password [email or web address] 02/27/2018, 11:26 AM

## 2018-02-27 NOTE — Consult Note (Signed)
Consultation Note Date: 02/27/2018   Patient Name: Robert Compton  DOB: 1945-09-08  MRN: [id number]  Age / Sex: 72 y.o., male  PCP: [name], MD Referring Physician: Eugenie Filler, MD  Reason for Consultation: Establishing goals of care  HPI/Patient Profile: 72 y.o. male  admitted on 02/25/2018    Clinical Assessment and Goals of Care:  72 year old gentleman who lives in [place], [place] with his son. He also has a daughter Robert Compton [phone].    Patient was diagnosed with extensive stage small cell lung cancer in June 2019. He is status post 5 cycles of systemic chemotherapy with partial response however recently was found to have evidence of disease progression.   Patient has been admitted to hospital medicine service with acute renal failure, probable tumor lysis syndrome. He has a recurrent large ascites and has undergone paracenteses and has also been given IV albumin. He is on broad-spectrum antibiotics for possible pneumonia/healthcare associated pneumonia. He also has severe protein calorie malnutrition.  Patient was seen and evaluated by his medical oncologist with final recommendations for the patient to consider palliative care and hospice services.  Palliative consultation follows.  Patient is awake alert resting in bed. I introduced myself and palliative care as follows:  Palliative medicine is specialized medical care for people living with serious illness. It focuses on providing relief from the symptoms and stress of a serious illness. The goal is to improve quality of life for both the patient and the family.  Patient states that at this point, his goals are not comfort focused, he does not agree at present with hospice philosophy of care. Patient states that he is still in shock about his rapidly progressive cancer and the advanced stages of his cancer. He states that he  is having a hard time coming to terms with the irreversible nature of his illness.  Brief life review performed. Patient's wife died 12 years ago. He has a son and a daughter. He is a [place] veteran. Patient is thanked for his service to the country. He states that he has has faith as a source of support.  We reviewed about CODE STATUS, we reviewed about goals wishes and values. Appropriate disposition options also discussed. Discussed with patient about the results from his most recent CT scan imaging. No family present at the bedside. Please note additional recommendations as listed below. Thank you for the consult.   NEXT OF KIN  daughter Robert Compton [phone] [place]    full code. Recommend SNF rehab with palliative care on discharge.  Discussed with radiation oncology colleague Ms Dara Lords, after reviewing CT, await recommendations regarding role of radiation treatments, as a palliative measure.  Continue current mode of care.   Code Status/Advance Care Planning:  Full code    Symptom Management:    as above   Palliative Prophylaxis:   Bowel Regimen   Psycho-social/Spiritual:   Desire for further Chaplaincy support:yes  Additional Recommendations: Caregiving  Support/Resources  Prognosis:   Unable to determine  Discharge Planning: To Be Determined      Primary Diagnoses: Present on Admission: . Acute renal failure (ARF) ([place]) . Severe protein-calorie malnutrition ([place]) . Pancytopenia ([place]) . Extensive stage primary small cell carcinoma of lung ([place]) . Essential hypertension . Chronic diastolic CHF (congestive heart failure) ([place]) . Hyperkalemia . 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 The following aspects are pertinent.  Past Medical History:  Diagnosis Date  . Arthritis   . Cancer ([place])   . Diabetes mellitus  without complication ([place])   . Hypertension    Social History   Socioeconomic History  . Marital status: Single    Spouse name: Not on file  . Number of children: Not on file  . Years of education: Not on file  . Highest education level: Not on file  Occupational History  . Occupation: [employer] operational officer  . Financial resource strain: Somewhat hard  . Food insecurity:    Worry: Never true    Inability: Never true  . Transportation needs:    Medical: No    Non-medical: No  Tobacco Use  . Smoking status: Former Smoker    Types: Cigarettes    Last attempt to quit: 09/20/2017    Years since quitting: 0.4  . Smokeless tobacco: Never Used  Substance and Sexual Activity  . Alcohol use: No  . Drug use: No  . Sexual activity: Not on file  Lifestyle  . Physical activity:    Days per week: 0 days    Minutes per session: 0 min  . Stress: Only a little  Relationships  . Social connections:    Talks on phone: More than three times a week    Gets together: More than three times a week    Attends religious service: More than 4 times per year    Active member of club or organization: Yes    Attends meetings of clubs or organizations: More than 4 times per year    Relationship status: Widowed  Other Topics Concern  . Not on file  Social History Narrative  . Not on file   Family History  Problem Relation Age of Onset  . Diabetes Mother   . Dementia Mother   . Colon cancer Father   . CAD Neg Hx    Scheduled Meds: . carvedilol  3.125 mg Oral BID WC  . Chlorhexidine Gluconate Cloth  6 each Topical Q0600  . insulin aspart  0-9 Units Subcutaneous TID WC  . mupirocin ointment  1 application Nasal BID  . omega-3 acid ethyl esters  1 g Oral QHS  . pantoprazole  40 mg Oral Daily  . vancomycin variable dose per unstable renal function (pharmacist dosing)   Does not apply See admin instructions  . vitamin B-12  100 mcg Oral Daily   Continuous Infusions: . sodium chloride 125  mL/hr at 02/27/18 0454  . aztreonam 1 g (02/27/18 0453)   PRN Meds:.acetaminophen **OR** acetaminophen, HYDROcodone-acetaminophen, ondansetron **OR** ondansetron (ZOFRAN) IV Medications Prior to Admission:  Prior to Admission medications   Medication Sig Start Date End Date Taking? Authorizing Provider  carvedilol (COREG) 3.125 MG tablet Take 1 tablet (3.125 mg total) by mouth 2 (two) times daily with a meal. 12/24/17  Yes Mariel Aloe, MD  CINNAMON PO Take 600 mg by mouth 2 (two) times daily.  Yes [provider]  Cyanocobalamin (VITAMIN B-12 PO) Take 1 tablet by mouth daily.   Yes [provider]  fish oil-omega-3 fatty acids 1000 MG capsule Take 1 g by mouth 2 (two) times daily.   Yes [provider]  furosemide (LASIX) 20 MG tablet Take 3 tablets (60 mg total) by mouth 2 (two) times daily. Patient taking differently: Take 40 mg by mouth 2 (two) times daily.  01/18/18  Yes Oswald Hillock, MD  HYDROcodone-acetaminophen (NORCO) 7.5-325 MG tablet Take 1 tablet by mouth every 6 (six) hours as needed for moderate pain. 02/25/18  Yes Gus Height, MD  loratadine (CLARITIN) 10 MG tablet Take 1 tablet (10 mg total) by mouth daily. 11/13/17  Yes Eugenie Filler, MD  metFORMIN (GLUCOPHAGE) 1000 MG tablet Take 1,000 mg by mouth 2 (two) times daily with a meal.   Yes [provider]  omeprazole (PRILOSEC) 20 MG capsule Take 20 mg by mouth daily.   Yes [provider]  Potassium Chloride ER 20 MEQ TBCR Take 20 mEq by mouth 2 (two) times daily. 12/02/17  Yes Dessa Phi, DO  PRESCRIPTION MEDICATION Take 10 mLs by mouth every 12 (twelve) hours as needed (pain). Vicodin liquid 7.5/300   Yes [provider]  Red Yeast Rice 600 MG TABS Take 1 tablet by mouth 2 (two) times daily.    Yes [provider]  traMADol (ULTRAM) 50 MG tablet Take 1 tablet (50 mg total) by mouth every 6 (six) hours as needed for moderate pain. 02/17/18  Yes Curt Bears, MD  doxycycline (VIBRA-TABS) 100 MG tablet Take 1 tablet (100 mg total) by mouth 2 (two) times daily. Patient not taking: Reported on 02/25/2018 01/20/18   Curt Bears, MD  fluticasone [facility]) 50 MCG/ACT nasal spray Place 2 sprays into both nostrils daily as needed for allergies. Patient not taking: Reported on 02/25/2018 11/12/17   Eugenie Filler, MD  insulin aspart (NOVOLOG) 100 UNIT/ML injection CBG < 70: implement hypoglycemia protocol CBG 70 - 120: 0 units CBG 121 - 150: 2 units CBG 151 - 200: 3 units CBG 201 - 250: 5 units CBG 251 - 300: 8 units CBG 301 - 350: 11 units CBG 351 - 400: 15 units CBG > 400: call MD Patient not taking: Reported on 02/25/2018 10/08/17   Lavina Hamman, MD  insulin glargine (LANTUS) 100 UNIT/ML injection Inject 0.1 mLs (10 Units total) into the skin at bedtime. Patient not taking: Reported on 02/25/2018 01/18/18   Oswald Hillock, MD  nicotine (NICODERM CQ - DOSED IN MG/24 HOURS) 21 mg/24hr patch Place 1 patch (21 mg total) onto the skin daily. Patient not taking: Reported on 02/25/2018 11/12/17   Eugenie Filler, MD  polyethylene glycol [facility] Flor[facility]) packet Take 17 g by mouth daily. Patient not taking: Reported on 02/25/2018 10/09/17   Lavina Hamman, MD  senna-docusate (SENOKOT-S) 8.6-50 MG tablet Take 1 tablet by mouth at bedtime as needed for mild constipation. Patient not taking: Reported on 02/25/2018 01/18/18   Oswald Hillock, MD   Allergies  Allergen Reactions  . Amoxicillin-Pot Clavulanate Anaphylaxis    Has patient had a PCN reaction causing immediate rash, facial/tongue/throat swelling, SOB or lightheadedness with hypotension: Yes Has patient had a PCN reaction causing severe rash involving mucus membranes or skin necrosis: Yes Has patient had a PCN reaction that required hospitalization: Yes Has patient had a PCN reaction occurring within the last 10 years: Yes If all  of the above answers are "NO", then may proceed with  Cephalosporin use.   . Nitrofurantoin Anaphylaxis   Review of Systems +back pain +abdominal fullness  Physical Exam Awake alert In mild distress Abdomen is distended soft Trace edema  Non focal S 1 S 2  Regular  Vital Signs: BP 103/65 (BP Location: Left Arm)   Pulse 96   Temp 97.7 F (36.5 C) (Oral)   Resp 18   Ht 6\' 3"  (1.905 m)   Wt 119.7 kg   SpO2 92%   BMI 32.98 kg/m  Pain Scale: 0-10   Pain Score: 0-No pain   SpO2: SpO2: 92 % O2 Device:SpO2: 92 % O2 Flow Rate: .   IO: Intake/output summary:   Intake/Output Summary (Last 24 hours) at 02/27/2018 1226 Last data filed at 02/27/2018 1002 Gross per 24 hour  Intake 2390 ml  Output 875 ml  Net 1515 ml    LBM: Last BM Date: 02/26/18 Baseline Weight: Weight: 118.8 kg Most recent weight: Weight: 119.7 kg     Palliative Assessment/Data:  PPS 40%   Time In:  11 Time Out:  12.10 Time Total:  70 min  Greater than 50%  of this time was spent counseling and coordinating care related to the above assessment and plan.  Signed by: Loistine Chance, MD  [phone] Please contact Palliative Medicine Team phone at [phone] for questions and concerns.  For individual provider: See Shea Evans

## 2018-02-27 NOTE — Progress Notes (Signed)
Pharmacy Antibiotic Note  Robert Compton is a 72 y.o. male admitted on 02/25/2018 with pneumonia.  Pharmacy has been consulted for aztreonam + vancomycin dosing.  Pt presenting with generalized weakness. He has a history of stage IV lung cancer. Pt has PCN allergy (anaphylaxis), being started on vancomycin + aztreonam on admission.  Pt has ben subsequently diagnosed with tumor lysis syndrome. Plan is to continue abx due to possible HCAP as seen on chest X-ray and recent immunocompromised state.   Today, 02/27/18  SCr = 3.32, slowly improving   WBC 9.8, WNL  Afebrile   Vancomycin 2000 mg loading dose given on 11/12 at 2149   Random vancomycin level drawn at 0549 on 11/14 was 10 mcg/ml   Plan:  Give vancomycin 17500 mg IV x1 as level is 10 mcg/ml   Monitor renal function for subsequent dosing, either scheduled or by levels   Random vancomycin level drawn at 0549 on 11/14 was 10 mcg/ml   Continue Aztreonam  1 g IV q8h given renal function  Height: 6\' 3"  (190.5 cm) Weight: 263 lb 14.3 oz (119.7 kg) IBW/kg (Calculated) : 84.5  Temp (24hrs), Avg:98.1 F (36.7 C), Min:97.7 F (36.5 C), Max:98.4 F (36.9 C)  Recent Labs  Lab 02/24/18 0939 02/25/18 1708 02/25/18 1819 02/26/18 0525 02/26/18 0812 02/27/18 0549  WBC 9.9 9.8  --  8.7  --  6.8  CREATININE 3.99* 4.03*  --  3.89* 3.68* 3.32*  LATICACIDVEN  --   --  3.42*  --   --   --   VANCORANDOM  --   --   --   --   --  10    Estimated Creatinine Clearance: 28.9 mL/min (A) (by C-G formula based on SCr of 3.32 mg/dL (H)).    Allergies  Allergen Reactions  . Amoxicillin-Pot Clavulanate Anaphylaxis    Has patient had a PCN reaction causing immediate rash, facial/tongue/throat swelling, SOB or lightheadedness with hypotension: Yes Has patient had a PCN reaction causing severe rash involving mucus membranes or skin necrosis: Yes Has patient had a PCN reaction that required hospitalization: Yes Has patient had a PCN reaction  occurring within the last 10 years: Yes If all of the above answers are "NO", then may proceed with Cephalosporin use.   . Nitrofurantoin Anaphylaxis   Antimicrobials this admission: vancomycin 11/12 >>  cefepime 11/12 >>   Dose adjustments this admission:  Microbiology results: 11/12 BCx: NGTD 11/12 UCx: NGF 11/13 MRSA PCR:  positive  Thank you for allowing pharmacy to be a part of this patient's care.  Royetta Asal, PharmD, BCPS Pager [phone] 02/27/2018 2:19 PM

## 2018-02-28 ENCOUNTER — Telehealth: Payer: Self-pay | Admitting: Internal Medicine

## 2018-02-28 LAB — RENAL FUNCTION PANEL
Albumin: 2.6 g/dL — ABNORMAL LOW (ref 3.5–5.0)
Anion gap: 8 (ref 5–15)
BUN: 41 mg/dL — ABNORMAL HIGH (ref 8–23)
CHLORIDE: 108 mmol/L (ref 98–111)
CO2: 22 mmol/L (ref 22–32)
CREATININE: 2.63 mg/dL — AB (ref 0.61–1.24)
Calcium: 8 mg/dL — ABNORMAL LOW (ref 8.9–10.3)
GFR calc Af Amer: 27 mL/min — ABNORMAL LOW (ref >60)
GFR calc non Af Amer: 23 mL/min — ABNORMAL LOW (ref >60)
Glucose, Bld: 193 mg/dL — ABNORMAL HIGH (ref 70–99)
POTASSIUM: 3.9 mmol/L (ref 3.5–5.1)
Phosphorus: 4.1 mg/dL (ref 2.5–4.6)
Sodium: 138 mmol/L (ref 135–145)

## 2018-02-28 LAB — CBC
HEMATOCRIT: 30.3 % — AB (ref 39.0–52.0)
HEMOGLOBIN: 9.6 g/dL — AB (ref 13.0–17.0)
MCH: 34.7 pg — ABNORMAL HIGH (ref 26.0–34.0)
MCHC: 31.7 g/dL (ref 30.0–36.0)
MCV: 109.4 fL — AB (ref 80.0–100.0)
Platelets: 35 10*3/uL — ABNORMAL LOW (ref 150–400)
RBC: 2.77 MIL/uL — AB (ref 4.22–5.81)
RDW: 18.2 % — ABNORMAL HIGH (ref 11.5–15.5)
WBC: 6 10*3/uL (ref 4.0–10.5)
nRBC: 0 % (ref 0.0–0.2)

## 2018-02-28 LAB — GLUCOSE, CAPILLARY
GLUCOSE-CAPILLARY: 212 mg/dL — AB (ref 70–99)
GLUCOSE-CAPILLARY: 126 mg/dL — AB (ref 70–99)
Glucose-Capillary: 250 mg/dL — ABNORMAL HIGH (ref 70–99)

## 2018-02-28 MED ORDER — LEVOFLOXACIN IN D5W 750 MG/150ML IV SOLN
750.0000 mg | INTRAVENOUS | Status: DC
  Administered 2018-02-28 – 2018-03-02 (×2): 750 mg via INTRAVENOUS
  Filled 2018-02-28 (×2): qty 150

## 2018-02-28 MED ORDER — HYDROMORPHONE HCL 1 MG/ML IJ SOLN
0.5000 mg | INTRAMUSCULAR | Status: DC | PRN
  Administered 2018-02-28 – 2018-03-10 (×26): 0.5 mg via INTRAVENOUS
  Filled 2018-02-28 (×26): qty 0.5

## 2018-02-28 MED ORDER — INSULIN GLARGINE 100 UNIT/ML ~~LOC~~ SOLN
10.0000 [IU] | Freq: Every day | SUBCUTANEOUS | Status: DC
  Administered 2018-02-28 – 2018-03-07 (×8): 10 [IU] via SUBCUTANEOUS
  Filled 2018-02-28 (×10): qty 0.1

## 2018-02-28 MED ORDER — OXYCODONE HCL 5 MG PO TABS
5.0000 mg | ORAL_TABLET | ORAL | Status: DC | PRN
  Administered 2018-02-28 – 2018-03-10 (×19): 5 mg via ORAL
  Filled 2018-02-28 (×20): qty 1

## 2018-02-28 MED ORDER — LIDOCAINE 5 % EX PTCH
1.0000 | MEDICATED_PATCH | CUTANEOUS | Status: DC
  Administered 2018-02-28 – 2018-03-10 (×10): 1 via TRANSDERMAL
  Filled 2018-02-28 (×12): qty 1

## 2018-02-28 MED ORDER — VANCOMYCIN VARIABLE DOSE PER UNSTABLE RENAL FUNCTION (PHARMACIST DOSING): Status: DC

## 2018-02-28 MED ORDER — HYDROCODONE-ACETAMINOPHEN 7.5-325 MG PO TABS
1.0000 | ORAL_TABLET | Freq: Once | ORAL | Status: AC
  Administered 2018-02-28: 1 via ORAL
  Filled 2018-02-28: qty 1

## 2018-02-28 NOTE — Evaluation (Signed)
Occupational Therapy Evaluation Patient Details Name: Robert Compton MRN: [id number] DOB: 12/14/1945 Today's Date: 02/28/2018    History of Present Illness 72 yo male admitted 11/12 for weakness, back pain, small cell lung cancer with mets to liver. Pt with multiple admissions over the last 6 months for cancer-related issues. PMH includes CHF, malnutrition, edema, tachycardia, neutropenia, paracentesis 5 days ago with 11L fluid removed and 11/13 with 8L removed, HTN, OA, RTKR, former smoker, ARF, DMII.    Clinical Impression   Pt was admitted for the above. This OT has seen him several times during previous admissions.  Pt talking about his prognosis of 30 days to live.  He states his dying wish is to walk with the 4 wheel walker.  PT AT ONE POINT ASKED ME IF I HAD SCISSORS SO HE COULD CUT HIS WRISTS.  HE ALSO SPEAKS OF THIS BEING GOD'S WILL. Will follow in acute setting focusing on walking to toilet using 4 wheel walker. Will add goals, according to pt's wishes.    Follow Up Recommendations  (?SNF vs 24/7)    Equipment Recommendations  None recommended by OT    Recommendations for Other Services       Precautions / Restrictions Precautions Precautions: Fall Restrictions Weight Bearing Restrictions: No      Mobility Bed Mobility               General bed mobility comments: oob  Transfers                 General transfer comment: not performed    Balance                                           ADL either performed or assessed with clinical judgement   ADL Overall ADL's : Needs assistance/impaired Eating/Feeding: Independent   Grooming: Set up;Oral care;Sitting   Upper Body Bathing: Minimal assistance   Lower Body Bathing: Maximal assistance   Upper Body Dressing : Moderate assistance   Lower Body Dressing: Total assistance;Sit to/from stand                 General ADL Comments: pt up in chair and did not want to get up nor  get back to bed.  Performed grooming tasks and UE exercises     Vision         Perception     Praxis      Pertinent Vitals/Pain Pain Score: 6  Pain Location: back  Pain Descriptors / Indicators: Aching Pain Intervention(s): Limited activity within patient's tolerance;Monitored during session;Premedicated before session     Hand Dominance Right   Extremity/Trunk Assessment Upper Extremity Assessment Upper Extremity Assessment: Generalized weakness           Communication Communication Communication: No difficulties(difficult to understand)   Cognition Arousal/Alertness: Awake/alert Behavior During Therapy: WFL for tasks assessed/performed Overall Cognitive Status: Within Functional Limits for tasks assessed                                     General Comments  pt c/o dizziness sitting up in chair:  BP 102/62. Did not want to recliner due to back pain nor get back to bed    Exercises Exercises: (AAROM to bil shoulders)   Shoulder Instructions      Home  Living Family/patient expects to be discharged to:: Unsure                   Bathroom Shower/Tub: Tub/shower unit   Bathroom Toilet: Handicapped height     Home Equipment: Environmental consultant - 2 wheels;Tub bench;Bedside commode;Hospital bed   Additional Comments: Pt sleeps in recliner       Prior Functioning/Environment      ADL's / Homemaking Assistance Needed: son assists            OT Problem List: Decreased strength;Decreased activity tolerance;Impaired balance (sitting and/or standing);Pain;Cardiopulmonary status limiting activity      OT Treatment/Interventions: Self-care/ADL training;Therapeutic exercise;DME and/or AE instruction;Patient/family education;Balance training;Therapeutic activities    OT Goals(Current goals can be found in the care plan section) Acute Rehab OT Goals Patient Stated Goal: My dying wish is to walk with the 4 wheel walker OT Goal Formulation: With  patient Time For Goal Achievement: 03/14/18 Potential to Achieve Goals: Fair ADL Goals Pt Will Transfer to Toilet: with min assist;ambulating;bedside commode(with 4 wheel walker)  OT Frequency: Min 2X/week   Barriers to D/C:            Co-evaluation              AM-PAC PT "6 Clicks" Daily Activity     Outcome Measure Help from another person eating meals?: None Help from another person taking care of personal grooming?: A Little Help from another person toileting, which includes using toliet, bedpan, or urinal?: A Lot Help from another person bathing (including washing, rinsing, drying)?: A Lot Help from another person to put on and taking off regular upper body clothing?: A Lot Help from another person to put on and taking off regular lower body clothing?: Total 6 Click Score: 14   End of Session Nurse Communication: (pt wanting scissors to slit wrists)--uncomfortable and dizzy but does not want to get back to bed  Activity Tolerance: Patient limited by fatigue Patient left: in chair;with call bell/phone within reach  OT Visit Diagnosis: Muscle weakness (generalized) (M62.81)                Time: 2841-3244 OT Time Calculation (min): 34 min Charges:  OT General Charges $OT Visit: 1 Visit  Lesle Chris, OTR/L Acute Rehabilitation Services [phone] [facility] pager [phone] office 02/28/2018  [place] 02/28/2018, 11:16 AM

## 2018-02-28 NOTE — Telephone Encounter (Signed)
Called regarding 11/22

## 2018-02-28 NOTE — Progress Notes (Addendum)
Daily Progress Note   Patient Name: Robert Compton       Date: 02/28/2018 DOB: 16-Oct-1945  Age: 72 y.o. MRN#: [id number] Attending Physician: Robert Filler, MD Primary Care Physician: Robert Solian, MD Admit Date: 02/25/2018  Reason for Consultation/Follow-up: Establishing goals of care  Subjective: Patient is sitting up in a chair. He avoids eye contact. He does not talk much today. He states he does not want to talk to "palatine" care.  The patient was watching television. Attempted to strike a conversation by asking him what he was watching. Patient stated that he likes to watch cop shows.   Call placed and discussed with daughter Robert Compton. She states that the patient has been feeling very "down" since the past 2 days or so. She states that the patient lives at home, with his son, son's friend and son's girlfriend. He is not alone at home.    See below  Length of Stay: 3  Current Medications: Scheduled Meds:  . carvedilol  3.125 mg Oral BID WC  . Chlorhexidine Gluconate Cloth  6 each Topical Q0600  . insulin aspart  0-9 Units Subcutaneous TID WC  . lidocaine  1 patch Transdermal Q24H  . mupirocin ointment  1 application Nasal BID  . omega-3 acid ethyl esters  1 g Oral QHS  . pantoprazole  40 mg Oral Daily  . vitamin B-12  100 mcg Oral Daily    Continuous Infusions: . sodium chloride 125 mL/hr at 02/28/18 1153  . levofloxacin (LEVAQUIN) IV 750 mg (02/28/18 1351)    PRN Meds: acetaminophen **OR** acetaminophen, HYDROcodone-acetaminophen, ondansetron **OR** ondansetron (ZOFRAN) IV  Physical Exam         . Physical exam not performed as patient not really participating much at this time Work of breathing appears to be regular Complaints of back pain Has some edema, has  some venous stasis changes both lower extremities Abdomen appears distended Awake alert but flat in affect today  Vital Signs: BP 98/63 (BP Location: Left Arm)   Pulse 85   Temp 98.5 F (36.9 C) (Oral)   Resp 16   Ht 6\' 3"  (1.905 m)   Wt 124.5 kg   SpO2 93%   BMI 34.30 kg/m  SpO2: SpO2: 93 % O2 Device: O2 Device: Room Air O2 Flow Rate:  Intake/output summary:   Intake/Output Summary (Last 24 hours) at 02/28/2018 1454 Last data filed at 02/28/2018 1451 Gross per 24 hour  Intake 2955 ml  Output 1150 ml  Net 1805 ml   LBM: Last BM Date: 02/26/18 Baseline Weight: Weight: 118.8 kg Most recent weight: Weight: 124.5 kg       Palliative Assessment/Data:      Patient Active Problem List   Diagnosis Date Noted  . Palliative care by specialist   . Goals of care, counseling/discussion   . Hyperkalemia 02/26/2018  . 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 Ascites   . HCAP (healthcare-associated pneumonia)   . Acute renal failure (ARF) ([place]) 02/25/2018  . Anasarca 01/11/2018  . Chronic diastolic CHF (congestive heart failure) ([place]) 01/11/2018  . Abdominal pain 01/11/2018  . Elevated lactic acid level 01/11/2018  . Malnutrition of moderate degree 12/20/2017  . SBP (spontaneous bacterial peritonitis) ([place])   . Abdominal distention   . Hypervolemia   . Edema 12/13/2017  . Severe protein-calorie malnutrition ([place]) 12/13/2017  . Abnormal liver function 12/13/2017  . Leukocytosis 12/13/2017  . Acute on chronic diastolic (congestive) heart failure ([place]) 11/27/2017  . Tachycardia 11/26/2017  . Anemia 11/26/2017  . Leg edema, right 11/26/2017  . Dehydration   . Pressure injury of skin 11/09/2017  . Hyperbilirubinemia   . Liver metastasis ([place])   . Neutropenic fever ([place])   . Pancytopenia ([place])   . Febrile neutropenia ([place]) 11/08/2017  . Severe sepsis ([place]) 11/08/2017  . Antineoplastic chemotherapy induced pancytopenia ([place]) 11/08/2017   . AKI (acute kidney injury) ([place]) 11/08/2017  . Encounter for antineoplastic chemotherapy   . 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 Liver masses   . Mass of upper lobe of right lung 09/28/2017  . Hyponatremia 09/27/2017  . Failure to thrive in adult 09/27/2017  . Metastatic cancer to liver ([place]) 09/27/2017  . Diabetes mellitus without complication ([place]) 78/29/5621  . Essential hypertension 09/27/2017    Palliative Care Assessment & Plan   Patient Profile:    Assessment:  72 year old gentleman who lives in [place], [place] with his son. He also has a daughter Robert Compton [phone].    Patient was diagnosed with extensive stage small cell lung cancer in June 2019. He is status post 5 cycles of systemic chemotherapy with partial response however recently was found to have evidence of disease progression.   Patient has been admitted to hospital medicine service with acute renal failure, probable tumor lysis syndrome. He has a recurrent large ascites and has undergone paracenteses and has also been given IV albumin. He is on broad-spectrum antibiotics for possible pneumonia/healthcare associated pneumonia. He also has severe protein calorie malnutrition.  Patient was seen and evaluated by his medical oncologist with final recommendations for the patient to consider palliative care and hospice services.  Palliative medicine team has been following for goals of care discussions. Patient was also seen and evaluated by radiation oncology, appreciate their input and recommendations.  Recommendations/Plan:   remains full code, as of now, ongoing discussions with patient and daughter Robert Compton about DNR DNI. Patient is decisional, in my opinion and while daughter understands that the patient will not benefit from a resuscitative attempt, in the face of progressive cancer, she wishes for the patient to make this decision himself.   Ongoing discussions with  patient and daughter about home with hospice versus SNF rehab with palliative care, daughter states  that the patient reportedly has gone to [facility] for rehabilitation a few months ago and they do not like the experience of being inside a facility.     Code Status:    Code Status Orders  (From admission, onward)         Start     Ordered   02/25/18 2025  Full code  Continuous     02/25/18 2028        Code Status History    Date Active Date Inactive Code Status Order ID Comments User Context   01/11/2018 0120 01/18/2018 1652 Full Code [id number]  Ivor Costa, MD ED   12/14/2017 0005 12/24/2017 2208 Full Code [id number]  [name], MD ED   11/26/2017 2055 12/02/2017 1558 Full Code [id number]  [name], MD Inpatient   11/08/2017 2005 11/12/2017 1833 Full Code [id number]  [name], DO ED   09/27/2017 2346 10/08/2017 2258 Full Code [id number]  [name], MD Inpatient       Prognosis:   < 3 months  Discharge Planning:  [place] for rehab with Palliative care service follow-up Versus home with hospice discussions are being undertaken.  Care plan was discussed with Patient, daughter Robert Compton over the phone   Thank you for allowing the Palliative Medicine Team to assist in the care of this patient.   Time In: 11 Time Out: 11.35 Total Time 35 Prolonged Time Billed  no       Greater than 50%  of this time was spent counseling and coordinating care related to the above assessment and plan.  Loistine Chance, MD [phone] Please contact Palliative Medicine Team phone at [phone] for questions and concerns.   Addendum: Received call from [facility] MD, that the patient has complained of ongoing uncontrolled pain in his back all day today.  Med history noted Plan: 1. Agree with Lidoderm patch.  2. D/C Norco 3. Start Oxy IR 4 IV Dilaudid low dose, for severe pain, not controlled by Oxy IR, try Oxy IR first.  Loistine Chance MD 02-28-18 [phone]

## 2018-02-28 NOTE — Progress Notes (Signed)
PROGRESS NOTE    Robert Compton  GYI:[id number] DOB: 10/24/1945 DOA: 02/25/2018 PCP: [name], MD    Brief Narrative:  Patient is a 72 year old male history of extensive stage small cell lung cancer started on immunotherapy, history of hypertension, diabetes type 2 presented to the ED with increasing weakness and increasing abdominal distention.  Patient noted to be in acute renal failure, hyperkalemic with a potassium of 6, chest x-ray showing possible infiltrates.  Patient admitted.  Placed on IV fluids.  Ultrasound-guided paracentesis ordered.  Oncology consulted.   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  Creatinine on admission was 4.03 from 1.20 on 02/14/2018.  Patient per admitting physician noted to have some episodes of diarrhea.  Blood pressure borderline.  Urine sodium was less than 10.  Renal ultrasound which was done with sonographically normal kidneys and moderate ascites.  Patient status post paracentesis.  Renal function trending down creatinine currently at 2.63 from 3.32.  Decrease IV fluids to 75 cc/h.  Follow.   2.  Hyperkalemia Likely secondary to problem #1 versus tumor lysis syndrome as patient noted to have an elevated uric acid level and elevated phosphorus level.  Status post 1 dose of Kayexalate.  Status post  IV rasburicase.  Hyperkalemia resolved.  Potassium at 3.9 this morning.  Follow.  3.  Probable tumor lysis syndrome Patient had presented with acute renal failure noted to be hyperkalemic with a potassium as high as 6.  Uric acid level elevated as well as phosphorus level.  Likely tumor lysis syndrome.  Discussed with oncology.  Status post IV rasburicase.  Patient also on IV fluids.  Patient received a dose of Kayexalate.  Potassium today is 3.9, phosphorus of 4.1 uric acid of 4.2.  Continue supportive care.  Follow.   4.  Hypertension Continue Coreg and monitor blood pressure closely.  Follow.  5.  Chronic diastolic CHF Euvolemic.  Diuretics on hold secondary to problem #1.  Monitor closely with gentle hydration.  6.  Large ascites Patient states had recent paracentesis done 5 days prior to admission with 11 L removed.  Patient with abdominal distention.  Likely secondary to metastatic small cell lung cancer.  Ultrasound-guided therapeutic thoracentesis done on 02/26/2018 with 8.4 L removed and fluid sent for cytology.  Status post IV albumin.  Patient with some abdominal distention.  Will likely need another ultrasound-guided therapeutic paracentesis.  Blood pressure currently borderline.  May need to be started on low-dose diuretics once blood pressure has improved.  Follow.   7.  Diabetes mellitus type 2 Hemoglobin A1c was 7.2 on 09/28/2017.  CBG of 250 this morning.  Continued to hold oral hypoglycemic agents.  Continue sliding scale insulin.  Resume home regimen of Lantus 10 units daily.   8.  Possible pneumonia/HCAP Noted on chest x-ray.  Patient currently afebrile however patient immunocompromised.  Continue empiric IV aztreonam and IV  vancomycin and treat for 5-7 days empirically.  Discontinue IV vancomycin IV aztreonam and transition to IV Levaquin.  Continue supportive care.   9.  Severe protein calorie malnutrition Secondary to metastatic small cell lung cancer.  Patient with poor  prognosis.   10.  Anemia/thrombocytopenia Likely secondary to malignancy and chemotherapy.  Patient with no overt bleeding.  Hemoglobin currently at 9.6.  Transfusion threshold hemoglobin less than 7 and platelet count less than 10.  Oncology following.  11.  Extensive stage small cell lung cancer with right upper lobe mass/chronic back pain Patient with some pulmonary nodules and significant mediastinal lymphadenopathy and liver mets diagnosed in June 2019.  Patient status post 5 cycles of chemotherapy and patient noted to be on maintenance treatment with single agent tecentriq first dose was February 24, 2018.  Patient with complaints of back pain secondary to metastatic disease.  Continue home regimen Vicodin.  Place on Lidoderm patch.  Patient with a poor prognosis felt per oncology and recommending palliative care.  Palliative care following.     DVT prophylaxis: SCDs Code Status: Full Family Communication: Updated patient.  No family at bedside. Disposition Plan: To be determined. Likely to skilled nursing facility with palliative care following.   Consultants:   Oncology: Dr. Lorna Few 02/26/2018  Palliative care consultation Dr. Rowe Pavy 02/27/2018.  Procedures:   Ultrasound-guided paracentesis 02/26/2018----8.4 L removed  CT abdomen and pelvis 02/25/2018  Chest x-ray 02/25/2018    Antimicrobials:  IV aztreonam 02/25/2018>>>>>> 02/28/2018  IV vancomycin 02/25/2018>>>>> 02/28/2018  IV Levaquin 02/28/2018   Subjective: Patient sitting up in chair.  Patient states he does not feel too well today.  Patient complained of back pain.  Denies any shortness of breath or chest pain.   Objective: Vitals:   02/27/18 1330 02/27/18 2135 02/28/18 0620 02/28/18 0900  BP: 113/66 114/66 112/68 102/62  Pulse: 90 92 91   Resp: 20 16 20    Temp: 98.2 F (36.8 C) 98.9 F (37.2 C) 98.3 F (36.8 C)   TempSrc: Oral Oral Oral   SpO2: 93% 91% 95%   Weight:   124.5 kg     Height:        Intake/Output Summary (Last 24 hours) at 02/28/2018 1117 Last data filed at 02/28/2018 0630 Gross per 24 hour  Intake 2715 ml  Output 1150 ml  Net 1565 ml   Filed Weights   02/26/18 0514 02/27/18 0458 02/28/18 0620  Weight: 124.9 kg 119.7 kg 124.5 kg    Examination:  General exam: NAD Respiratory system: CTAB.  No wheezes, no crackles, no rhonchi. Respiratory effort normal. Cardiovascular system: RRR no murmurs rubs or gallops.  No JVD.  No lower extremity edema.  Gastrointestinal system: Abdomen is distended, soft, nontender to palpation, positive bowel sounds.  No rebound.  No guarding.  Central nervous system: Alert and oriented. No focal neurological deficits. Extremities: Symmetric 5 x 5 power. Skin: No rashes, lesions or ulcers Psychiatry: Judgement and insight appear normal. Mood & affect flat.     Data Reviewed: I have personally reviewed following labs and imaging studies  CBC: Recent Labs  Lab 02/24/18 0939 02/25/18 1708 02/26/18 0525 02/27/18 0549 02/28/18 0603  WBC 9.9 9.8 8.7 6.8 6.0  NEUTROABS 7.7  --   --  5.1  --   HGB 10.8* 11.0* 10.0* 9.3* 9.6*  HCT 32.5* 33.4* 30.4* 28.6* 30.3*  MCV 104.5* 105.0* 107.0* 109.2* 109.4*  PLT 55* 51* 49* 41* 35*   Basic Metabolic Panel: Recent Labs  Lab 02/25/18 1708 02/26/18 0525 02/26/18 0812 02/26/18 1757 02/27/18 0549 02/28/18 0603  NA 133* 133* 134*  --  137 138  K 6.0* 6.0* 5.9* 5.3* 4.3 3.9  CL 101 102 101  --  104 108  CO2 19* 21* 22  --  22 22  GLUCOSE 146* 170* 158*  --  180* 193*  BUN 49* 53* 52*  --  46* 41*  CREATININE 4.03* 3.89* 3.68*  --  3.32* 2.63*  CALCIUM 8.2* 8.5* 8.6*  --  8.3* 8.0*  PHOS  --   --  4.8*  --  4.1 4.1   GFR: Estimated Creatinine Clearance: 37.2 mL/min (A) (by C-G formula based on SCr of 2.63 mg/dL (H)). Liver Function Tests: Recent Labs  Lab 02/24/18 0939 02/25/18 1708 02/28/18 0603  AST 55* 62*  --   ALT 31 33  --   ALKPHOS 126 115  --    BILITOT 2.5* 2.7*  --   PROT 6.7 6.2*  --   ALBUMIN 2.0* 2.0* 2.6*   Recent Labs  Lab 02/25/18 1708  LIPASE 41   Recent Labs  Lab 02/25/18 2258  AMMONIA 55*   Coagulation Profile: Recent Labs  Lab 02/25/18 1708  INR 1.25   Cardiac Enzymes: No results for input(s): CKTOTAL, CKMB, CKMBINDEX, TROPONINI in the last 168 hours. BNP (last 3 results) No results for input(s): PROBNP in the last 8760 hours. HbA1C: No results for input(s): HGBA1C in the last 72 hours. CBG: Recent Labs  Lab 02/27/18 1139 02/27/18 1615 02/27/18 2138 02/28/18 0752 02/28/18 1107  GLUCAP 192* 146* 161* 250* 212*   Lipid Profile: No results for input(s): CHOL, HDL, LDLCALC, TRIG, CHOLHDL, LDLDIRECT in the last 72 hours. Thyroid Function Tests: No results for input(s): TSH, T4TOTAL, FREET4, T3FREE, THYROIDAB in the last 72 hours. Anemia Panel: No results for input(s): VITAMINB12, FOLATE, FERRITIN, TIBC, IRON, RETICCTPCT in the last 72 hours. Sepsis Labs: Recent Labs  Lab 02/25/18 1819  LATICACIDVEN 3.42*    Recent Results (from the past 240 hour(s))  Urine culture     Status: None   Collection Time: 02/25/18  6:34 PM  Result Value Ref Range Status   Specimen Description   Final    URINE, CLEAN CATCH Performed at [facility] [address], [place], [place] [address]    Special Requests   Final    NONE Performed at [facility], [place] [address], [place], [place] [address]    Culture   Final    NO GROWTH Performed at [facility], [place] [address] [place], [place] [address]    Report Status 02/27/2018 FINAL  Final  Blood culture (routine x 2)     Status: None (Preliminary result)   Collection Time: 02/25/18  8:13 PM  Result Value Ref Range Status   Specimen Description   Final    BLOOD LEFT WRIST Performed at [facility], [place] [address] [place], [place] [address]    Special Requests   Final    BOTTLES DRAWN AEROBIC AND  ANAEROBIC Blood Culture adequate volume Performed at [facility] [address], [place], [place] [address]    Culture   Final    NO GROWTH 2 DAYS Performed at [facility] [address] [place], [place] [address]    Report Status PENDING  Incomplete  Blood culture (routine x 2)     Status: None (Preliminary result)   Collection Time:  02/25/18 10:58 PM  Result Value Ref Range Status   Specimen Description   Final    BLOOD LEFT HAND Performed at [facility], [place] [address] [place], [place] [address]    Special Requests   Final    AEROBIC BOTTLE ONLY Blood Culture results may not be optimal due to an inadequate volume of blood received in culture bottles Performed at [facility] [address] [place], [place] [address]    Culture   Final    NO GROWTH 2 DAYS Performed at [facility] [address], [place], [place] [address]    Report Status PENDING  Incomplete  MRSA PCR Screening     Status: Abnormal   Collection Time: 02/26/18  5:08 AM  Result Value Ref Range Status   MRSA by PCR POSITIVE (A) NEGATIVE Final    Comment:        The GeneXpert MRSA Assay (FDA approved for NASAL specimens only), is one component of a comprehensive MRSA colonization surveillance program. It is not intended to diagnose MRSA infection nor to guide or monitor treatment for MRSA infections. RESULT CALLED TO, READ BACK BY AND VERIFIED WITH: CAMPBELL,M RN AT 7654 02/26/18 BY TIBBITTS,K Performed at [facility], [place] [address], [place], [place] [address]          Radiology Studies: US Paracentesis  Result Date: 02/26/2018 INDICATION: Patient with history of small cell lung cancer, recurrent ascites. Request made for diagnostic and therapeutic paracentesis. EXAM: ULTRASOUND GUIDED DIAGNOSTIC AND THERAPEUTIC PARACENTESIS MEDICATIONS: None COMPLICATIONS: None immediate. PROCEDURE: Informed written consent was  obtained from the patient after a discussion of the risks, benefits and alternatives to treatment. A timeout was performed prior to the initiation of the procedure. Initial ultrasound scanning demonstrates a large amount of ascites within the left lower abdominal quadrant. The left lower abdomen was prepped and draped in the usual sterile fashion. 1% lidocaine was used for local anesthesia. Following this, a 19 gauge, 10-cm, Yueh catheter was introduced. An ultrasound image was saved for documentation purposes. The paracentesis was performed. The catheter was removed and a dressing was applied. The patient tolerated the procedure well without immediate post procedural complication. FINDINGS: A total of approximately 8.4 liters of amber/blood-tinged fluid was removed. Samples were sent to the laboratory as requested by the clinical team. IMPRESSION: Successful ultrasound-guided diagnostic and therapeutic paracentesis yielding 8.4 liters of peritoneal fluid. Read by: Rowe Baer, PA-C Electronically Signed   By: Corrie Mckusick D.O.   On: 02/26/2018 15:47        Scheduled Meds: . carvedilol  3.125 mg Oral BID WC  . Chlorhexidine Gluconate Cloth  6 each Topical Q0600  . insulin aspart  0-9 Units Subcutaneous TID WC  . mupirocin ointment  1 application Nasal BID  . omega-3 acid ethyl esters  1 g Oral QHS  . pantoprazole  40 mg Oral Daily  . vancomycin variable dose per unstable renal function (pharmacist dosing)   Does not apply See admin instructions  . vitamin B-12  100 mcg Oral Daily   Continuous Infusions: . sodium chloride 125 mL/hr at 02/28/18 0154  . aztreonam 1 g (02/28/18 0558)     LOS: 3 days    Time spent: 40 minutes    Irine Seal, MD [facility] Pager [phone]  If 7PM-7AM, please contact night-coverage [email or web address] Password [email or web address] 02/28/2018, 11:17 AM

## 2018-02-28 NOTE — Progress Notes (Signed)
   02/28/18 1453  Clinical Encounter Type  Visited With Patient and family together  Visit Type Initial  Referral From Chaplain  Consult/Referral To Chaplain  The chaplain responded to a verbal request for Pt. spiritual care from [place].  The Pt. accepted the chaplain's presence and appeared to be comfortable sharing his terminal diagnosis, family,  and spirituality with the chaplain. The Pt. was sitting up when the chaplain arrived and remain in a flat mood throughout the visit.  The Pt. sister and brother 'n law joined the conversation with the Pt. after about 5 minutes.  The chaplain heard the Pt.'s disconnect with daughter and son. The Pt. explained the relationship as both children do not have time for me. The Pt. appeared to have knowledge of his illness but was more comfortable leaving it in God's hands.  The Pt.'s 50 year relationship with God and promise of eternal life continued to be the Pt. safe place when the conversation was tough.  The Pt. adamantly shared his desire to go home to "his address" over rehab.  The Pt. accepted the chaplain's invitation to return for future spiritual care.  The Pt., chaplain, and family prayed together before the chaplain exited.

## 2018-03-01 ENCOUNTER — Inpatient Hospital Stay (HOSPITAL_COMMUNITY): Payer: BLUE CROSS/BLUE SHIELD

## 2018-03-01 DIAGNOSIS — D61818 Other pancytopenia: Secondary | ICD-10-CM

## 2018-03-01 LAB — GRAM STAIN

## 2018-03-01 LAB — CBC
HEMATOCRIT: 31.8 % — AB (ref 39.0–52.0)
Hemoglobin: 10.1 g/dL — ABNORMAL LOW (ref 13.0–17.0)
MCH: 35.3 pg — AB (ref 26.0–34.0)
MCHC: 31.8 g/dL (ref 30.0–36.0)
MCV: 111.2 fL — AB (ref 80.0–100.0)
Platelets: 37 10*3/uL — ABNORMAL LOW (ref 150–400)
RBC: 2.86 MIL/uL — ABNORMAL LOW (ref 4.22–5.81)
RDW: 18.6 % — AB (ref 11.5–15.5)
WBC: 7.3 10*3/uL (ref 4.0–10.5)
nRBC: 0 % (ref 0.0–0.2)

## 2018-03-01 LAB — COMPREHENSIVE METABOLIC PANEL
ALBUMIN: 2.5 g/dL — AB (ref 3.5–5.0)
ALT: 32 U/L (ref 0–44)
AST: 54 U/L — AB (ref 15–41)
Alkaline Phosphatase: 100 U/L (ref 38–126)
Anion gap: 8 (ref 5–15)
BUN: 40 mg/dL — AB (ref 8–23)
CHLORIDE: 110 mmol/L (ref 98–111)
CO2: 22 mmol/L (ref 22–32)
CREATININE: 2.15 mg/dL — AB (ref 0.61–1.24)
Calcium: 8.4 mg/dL — ABNORMAL LOW (ref 8.9–10.3)
GFR calc Af Amer: 34 mL/min — ABNORMAL LOW (ref >60)
GFR, EST NON AFRICAN AMERICAN: 29 mL/min — AB (ref >60)
GLUCOSE: 138 mg/dL — AB (ref 70–99)
Potassium: 4 mmol/L (ref 3.5–5.1)
Sodium: 140 mmol/L (ref 135–145)
Total Bilirubin: 4.1 mg/dL — ABNORMAL HIGH (ref 0.3–1.2)
Total Protein: 6 g/dL — ABNORMAL LOW (ref 6.5–8.1)

## 2018-03-01 LAB — GLUCOSE, CAPILLARY
GLUCOSE-CAPILLARY: 147 mg/dL — AB (ref 70–99)
Glucose-Capillary: 126 mg/dL — ABNORMAL HIGH (ref 70–99)
Glucose-Capillary: 176 mg/dL — ABNORMAL HIGH (ref 70–99)

## 2018-03-01 LAB — BODY FLUID CELL COUNT WITH DIFFERENTIAL
Eos, Fluid: 0 %
Lymphs, Fluid: 28 %
MONOCYTE-MACROPHAGE-SEROUS FLUID: 54 % (ref 50–90)
Neutrophil Count, Fluid: 18 % (ref 0–25)
WBC FLUID: 117 uL (ref 0–1000)

## 2018-03-01 LAB — ALBUMIN, PLEURAL OR PERITONEAL FLUID: ALBUMIN FL: 0.8 g/dL

## 2018-03-01 LAB — GLUCOSE, PLEURAL OR PERITONEAL FLUID: Glucose, Fluid: 159 mg/dL

## 2018-03-01 LAB — LACTATE DEHYDROGENASE, PLEURAL OR PERITONEAL FLUID: LD, Fluid: 119 U/L — ABNORMAL HIGH (ref 3–23)

## 2018-03-01 LAB — PROTEIN, PLEURAL OR PERITONEAL FLUID

## 2018-03-01 MED ORDER — SODIUM CHLORIDE 0.9 % IV BOLUS
1000.0000 mL | Freq: Once | INTRAVENOUS | Status: AC
  Administered 2018-03-01: 1000 mL via INTRAVENOUS

## 2018-03-01 MED ORDER — LIDOCAINE HCL 1 % IJ SOLN
INTRAMUSCULAR | Status: AC
  Administered 2018-03-01: 08:00:00
  Filled 2018-03-01: qty 10

## 2018-03-01 NOTE — Procedures (Signed)
PROCEDURE SUMMARY:  Successful US guided paracentesis from [facility].  Yielded 7.8 L of clear yellow fluid.  No immediate complications.  Pt tolerated well.   Specimen was sent for labs.  Ascencion Dike PA-C 03/01/2018 1:37 PM

## 2018-03-01 NOTE — Progress Notes (Signed)
PROGRESS NOTE    Robert Compton  PYK:[id number] DOB: [birth year] DOA: 02/25/2018 PCP: [name], MD    Brief Narrative:  Patient is a 72 year old male history of extensive stage small cell lung cancer started on immunotherapy, history of hypertension, diabetes type 2 presented to the ED with increasing weakness and increasing abdominal distention.  Patient noted to be in acute renal failure, hyperkalemic with a potassium of 6, chest x-ray showing possible infiltrates.  Patient admitted.  Placed on IV fluids.  Ultrasound-guided paracentesis ordered.  Oncology consulted.   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  Creatinine on admission was 4.03 from 1.20 on 02/14/2018.  Patient per admitting physician noted to have some episodes of diarrhea.  Blood pressure borderline.  Urine sodium was less than 10.  Renal ultrasound which was done with sonographically normal kidneys and moderate ascites.  Patient status post paracentesis.  Renal function trending down creatinine currently at 2.15 from 2.63 from 3.32. Increase IV fluids to 100 cc/h.  Follow.   2.  Hyperkalemia Likely secondary to problem #1 versus tumor lysis syndrome as patient noted to have an elevated uric acid level and elevated phosphorus level.  Status post 1 dose of Kayexalate.   Status post IV rasburicase.  Hyperkalemia resolved.  Potassium at 4 this morning.  Follow.  3.  Probable tumor lysis syndrome Patient had presented with acute renal failure noted to be hyperkalemic with a potassium as high as 6.  Uric acid level elevated as well as phosphorus level.  Likely tumor lysis syndrome.  Discussed with oncology.  Status post IV rasburicase.  Patient also on IV fluids.  Patient received a dose of Kayexalate.  Potassium today is 4, phosphorus of 4.1 uric acid of 4.2.  Continue supportive care.  Follow.   4.  Hypertension Continue Coreg and monitor blood pressure closely.  Blood pressure borderline.  Discontinue Coreg.  Follow.  5.  Chronic diastolic CHF Euvolemic.  Diuretics on hold secondary to problem #1.  Monitor closely with gentle hydration.  6.  Large ascites Patient states had recent paracentesis done 5 days prior to admission with 11 L removed.  Patient with abdominal distention.  Likely secondary to metastatic small cell lung cancer versus cirrhosis.  Ultrasound-guided therapeutic thoracentesis done on 02/26/2018 with 8.4 L removed and fluid sent for cytology.  Status post IV albumin.  Patient with some abdominal distention.  Patient scheduled for another ultrasound-guided therapeutic paracentesis.  Blood pressure borderline and as such unable to place patient on low-dose diuretics.  Will likely need another ultrasound-guided therapeutic paracentesis.  Blood pressure currently borderline.  Follow.   7.  Diabetes mellitus type 2 Hemoglobin A1c was 7.2 on 09/28/2017.  CBG of 126 this morning.  Continued to hold oral hypoglycemic agents.  Continue sliding scale insulin.  Resume home regimen of Lantus 10 units daily.   8.  Possible pneumonia/HCAP  Noted on chest x-ray.  Patient currently afebrile however patient immunocompromised.  Patient was empirically on IV aztreonam and IV vancomycin and subsequently transition to IV Levaquin.  Will treat empirically for 7 days.   Continue supportive care.   9.  Severe protein calorie malnutrition Secondary to metastatic small cell lung cancer.  Patient with poor prognosis.   10.  Anemia/thrombocytopenia Likely secondary to malignancy and chemotherapy.  Patient with no overt bleeding.  Hemoglobin currently at 10.1.  Transfusion threshold hemoglobin less than 7 and platelet count less than 10.  Oncology following.  11.  Extensive stage small cell lung cancer with right upper lobe mass/chronic back pain Patient with some pulmonary nodules and significant mediastinal lymphadenopathy and liver mets diagnosed in June 2019.  Patient status post 5 cycles of chemotherapy and patient noted to be on maintenance treatment with single agent tecentriq first dose was February 24, 2018.  Patient with complaints of back pain secondary to metastatic disease.  Continue home regimen Vicodin.  Place on Lidoderm patch.  Patient with a poor prognosis felt per oncology and recommending palliative care.  Palliative care following.     DVT prophylaxis: SCDs Code Status: Full Family Communication: Updated patient.  No family at bedside. Disposition Plan: To be determined. Likely to skilled nursing facility with palliative care following.   Consultants:   Oncology: Dr. Lorna Few 02/26/2018  Palliative care consultation Dr. Rowe Pavy 02/27/2018.  Procedures:   Ultrasound-guided paracentesis 02/26/2018----8.4 L removed  CT abdomen and pelvis 02/25/2018  Chest x-ray 02/25/2018    Antimicrobials:  IV aztreonam 02/25/2018>>>>>> 02/28/2018  IV vancomycin 02/25/2018>>>>> 02/28/2018  IV Levaquin 02/28/2018   Subjective: Patient laying in bed.  States some improvement in back pain with current pain regimen that he was started on.  Some abdominal discomfort.  Denies any chest pain.  Denies any shortness of breath.  Complains of abdominal tightness feeling he may need another paracentesis.    Objective: Vitals:   03/01/18 0243  03/01/18 0500 03/01/18 0528 03/01/18 0756  BP: 118/73  113/73   Pulse: 89  94   Resp:   18   Temp:   97.7 F (36.5 C)   TempSrc:   Oral   SpO2:   98% 97%  Weight:  127.8 kg    Height:        Intake/Output Summary (Last 24 hours) at 03/01/2018 1106 Last data filed at 03/01/2018 0600 Gross per 24 hour  Intake 1495 ml  Output 475 ml  Net 1020 ml   Filed Weights   02/27/18 0458 02/28/18 0620 03/01/18 0500  Weight: 119.7 kg 124.5 kg 127.8 kg    Examination:  General exam: NAD Respiratory system: Lungs clear to auscultation bilaterally.  No wheezes, no crackles, no rhonchi.  Normal respiratory effort.  Cardiovascular system: Regular rate rhythm no murmurs rubs or gallops.  No JVD.  No lower extremity edema. Gastrointestinal system: Abdomen is soft, distended, some diffuse discomfort to palpation, positive bowel sounds.  No rebound.  No guarding.   Central nervous system: Alert and oriented. No focal neurological deficits. Extremities: Symmetric 5 x 5 power. Skin: No rashes, lesions or ulcers Psychiatry: Judgement and insight appear normal. Mood & affect flat.     Data Reviewed: I have personally reviewed following labs and imaging studies  CBC: Recent Labs  Lab 02/24/18 0939 02/25/18 1708 02/26/18 0525 02/27/18 0549 02/28/18 0603 03/01/18 0524  WBC 9.9 9.8 8.7 6.8 6.0 7.3  NEUTROABS 7.7  --   --  5.1  --   --  HGB 10.8* 11.0* 10.0* 9.3* 9.6* 10.1*  HCT 32.5* 33.4* 30.4* 28.6* 30.3* 31.8*  MCV 104.5* 105.0* 107.0* 109.2* 109.4* 111.2*  PLT 55* 51* 49* 41* 35* 37*   Basic Metabolic Panel: Recent Labs  Lab 02/26/18 0525 02/26/18 0812 02/26/18 1757 02/27/18 0549 02/28/18 0603 03/01/18 0524  NA 133* 134*  --  137 138 140  K 6.0* 5.9* 5.3* 4.3 3.9 4.0  CL 102 101  --  104 108 110  CO2 21* 22  --  22 22 22   GLUCOSE 170* 158*  --  180* 193* 138*  BUN 53* 52*  --  46* 41* 40*  CREATININE 3.89* 3.68*  --  3.32* 2.63* 2.15*  CALCIUM 8.5* 8.6*  --  8.3* 8.0* 8.4*   PHOS  --  4.8*  --  4.1 4.1  --    GFR: Estimated Creatinine Clearance: 46 mL/min (A) (by C-G formula based on SCr of 2.15 mg/dL (H)). Liver Function Tests: Recent Labs  Lab 02/24/18 0939 02/25/18 1708 02/28/18 0603 03/01/18 0524  AST 55* 62*  --  54*  ALT 31 33  --  32  ALKPHOS 126 115  --  100  BILITOT 2.5* 2.7*  --  4.1*  PROT 6.7 6.2*  --  6.0*  ALBUMIN 2.0* 2.0* 2.6* 2.5*   Recent Labs  Lab 02/25/18 1708  LIPASE 41   Recent Labs  Lab 02/25/18 2258  AMMONIA 55*   Coagulation Profile: Recent Labs  Lab 02/25/18 1708  INR 1.25   Cardiac Enzymes: No results for input(s): CKTOTAL, CKMB, CKMBINDEX, TROPONINI in the last 168 hours. BNP (last 3 results) No results for input(s): PROBNP in the last 8760 hours. HbA1C: No results for input(s): HGBA1C in the last 72 hours. CBG: Recent Labs  Lab 02/27/18 2138 02/28/18 0752 02/28/18 1107 02/28/18 1706 03/01/18 0811  GLUCAP 161* 250* 212* 126* 126*   Lipid Profile: No results for input(s): CHOL, HDL, LDLCALC, TRIG, CHOLHDL, LDLDIRECT in the last 72 hours. Thyroid Function Tests: No results for input(s): TSH, T4TOTAL, FREET4, T3FREE, THYROIDAB in the last 72 hours. Anemia Panel: No results for input(s): VITAMINB12, FOLATE, FERRITIN, TIBC, IRON, RETICCTPCT in the last 72 hours. Sepsis Labs: Recent Labs  Lab 02/25/18 1819  LATICACIDVEN 3.42*    Recent Results (from the past 240 hour(s))  Urine culture     Status: None   Collection Time: 02/25/18  6:34 PM  Result Value Ref Range Status   Specimen Description   Final    URINE, CLEAN CATCH Performed at [facility] [address] [place], [place] [address]    Special Requests   Final    NONE Performed at [facility], [place] [address], [place], [place] [address]    Culture   Final    NO GROWTH Performed at [facility], [place] [address] [place], [place] [address]    Report Status 02/27/2018 FINAL  Final  Blood  culture (routine x 2)     Status: None (Preliminary result)   Collection Time: 02/25/18  8:13 PM  Result Value Ref Range Status   Specimen Description   Final    BLOOD LEFT WRIST Performed at [facility], [place] [address] [place], [place] [address]    Special Requests   Final    BOTTLES DRAWN AEROBIC AND ANAEROBIC Blood Culture adequate volume Performed at [facility] [address], [place], [place] [address]    Culture  Final    NO GROWTH 3 DAYS Performed at [facility], [place] [address] [place], [place] [address]    Report Status PENDING  Incomplete  Blood culture (routine x 2)     Status: None (Preliminary result)   Collection Time: 02/25/18 10:58 PM  Result Value Ref Range Status   Specimen Description   Final    BLOOD LEFT HAND Performed at [facility], [place] [address], [place], [place] [address]    Special Requests   Final    AEROBIC BOTTLE ONLY Blood Culture results may not be optimal due to an inadequate volume of blood received in culture bottles Performed at [facility] [address] [place], [place] [address]    Culture   Final    NO GROWTH 3 DAYS Performed at [facility], [place] [address], [place], [place] [address]    Report Status PENDING  Incomplete  MRSA PCR Screening     Status: Abnormal   Collection Time: 02/26/18  5:08 AM  Result Value Ref Range Status   MRSA by PCR POSITIVE (A) NEGATIVE Final    Comment:        The GeneXpert MRSA Assay (FDA approved for NASAL specimens only), is one component of a comprehensive MRSA colonization surveillance program. It is not intended to diagnose MRSA infection nor to guide or monitor treatment for MRSA infections. RESULT CALLED TO, READ BACK BY AND VERIFIED WITH: CAMPBELL,M RN AT 5697 02/26/18 BY TIBBITTS,K Performed at [facility], [place] [address], [place], [place] [address]          Radiology Studies: No results  found.      Scheduled Meds: . carvedilol  3.125 mg Oral BID WC  . Chlorhexidine Gluconate Cloth  6 each Topical Q0600  . insulin aspart  0-9 Units Subcutaneous TID WC  . insulin glargine  10 Units Subcutaneous QHS  . lidocaine  1 patch Transdermal Q24H  . lidocaine      . mupirocin ointment  1 application Nasal BID  . omega-3 acid ethyl esters  1 g Oral QHS  . pantoprazole  40 mg Oral Daily  . vitamin B-12  100 mcg Oral Daily   Continuous Infusions: . sodium chloride 75 mL/hr at 03/01/18 0819  . levofloxacin (LEVAQUIN) IV 750 mg (02/28/18 1351)     LOS: 4 days    Time spent: 40 minutes    Irine Seal, MD [facility] Pager [phone]  If 7PM-7AM, please contact night-coverage [email or web address] Password [email or web address] 03/01/2018, 11:06 AM

## 2018-03-02 LAB — CBC
HCT: 31.5 % — ABNORMAL LOW (ref 39.0–52.0)
HEMOGLOBIN: 10 g/dL — AB (ref 13.0–17.0)
MCH: 34.8 pg — AB (ref 26.0–34.0)
MCHC: 31.7 g/dL (ref 30.0–36.0)
MCV: 109.8 fL — AB (ref 80.0–100.0)
NRBC: 0 % (ref 0.0–0.2)
Platelets: 30 10*3/uL — ABNORMAL LOW (ref 150–400)
RBC: 2.87 MIL/uL — AB (ref 4.22–5.81)
RDW: 18.6 % — ABNORMAL HIGH (ref 11.5–15.5)
WBC: 6.3 10*3/uL (ref 4.0–10.5)

## 2018-03-02 LAB — BASIC METABOLIC PANEL
ANION GAP: 8 (ref 5–15)
BUN: 42 mg/dL — AB (ref 8–23)
CHLORIDE: 110 mmol/L (ref 98–111)
CO2: 21 mmol/L — ABNORMAL LOW (ref 22–32)
Calcium: 8.2 mg/dL — ABNORMAL LOW (ref 8.9–10.3)
Creatinine, Ser: 2.23 mg/dL — ABNORMAL HIGH (ref 0.61–1.24)
GFR calc Af Amer: 33 mL/min — ABNORMAL LOW (ref >60)
GFR, EST NON AFRICAN AMERICAN: 28 mL/min — AB (ref >60)
Glucose, Bld: 185 mg/dL — ABNORMAL HIGH (ref 70–99)
POTASSIUM: 3.8 mmol/L (ref 3.5–5.1)
SODIUM: 139 mmol/L (ref 135–145)

## 2018-03-02 LAB — PHOSPHORUS: PHOSPHORUS: 4.1 mg/dL (ref 2.5–4.6)

## 2018-03-02 LAB — GLUCOSE, CAPILLARY
GLUCOSE-CAPILLARY: 160 mg/dL — AB (ref 70–99)
GLUCOSE-CAPILLARY: 132 mg/dL — AB (ref 70–99)
GLUCOSE-CAPILLARY: 117 mg/dL — AB (ref 70–99)
Glucose-Capillary: 142 mg/dL — ABNORMAL HIGH (ref 70–99)

## 2018-03-02 LAB — URIC ACID: URIC ACID, SERUM: 2.5 mg/dL — AB (ref 3.7–8.6)

## 2018-03-02 MED ORDER — ALBUMIN HUMAN 25 % IV SOLN
25.0000 g | Freq: Four times a day (QID) | INTRAVENOUS | Status: AC
  Administered 2018-03-02 – 2018-03-04 (×8): 25 g via INTRAVENOUS
  Filled 2018-03-02 (×8): qty 100

## 2018-03-02 MED ORDER — LACTULOSE 10 GM/15ML PO SOLN
20.0000 g | Freq: Three times a day (TID) | ORAL | Status: DC
  Administered 2018-03-02 – 2018-03-08 (×19): 20 g via ORAL
  Filled 2018-03-02 (×19): qty 30

## 2018-03-02 MED ORDER — POLYETHYLENE GLYCOL 3350 17 G PO PACK
17.0000 g | PACK | Freq: Every day | ORAL | Status: DC
  Administered 2018-03-02 – 2018-03-09 (×7): 17 g via ORAL
  Filled 2018-03-02 (×8): qty 1

## 2018-03-02 NOTE — Progress Notes (Signed)
PROGRESS NOTE    Robert Compton  QGB:[id number] DOB: 1945/11/20 DOA: 02/25/2018 PCP: [name], MD    Brief Narrative:  Patient is a 72 year old male history of extensive stage small cell lung cancer started on immunotherapy, history of hypertension, diabetes type 2 presented to the ED with increasing weakness and increasing abdominal distention.  Patient noted to be in acute renal failure, hyperkalemic with a potassium of 6, chest x-ray showing possible infiltrates.  Patient admitted.  Placed on IV fluids.  Ultrasound-guided paracentesis ordered.  Oncology consulted.   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  Creatinine on admission was 4.03 from 1.20 on 02/14/2018.  Patient per admitting physician noted to have some episodes of diarrhea.  Blood pressure borderline.  Urine sodium was less than 10.  Renal ultrasound which was done with sonographically normal kidneys and moderate ascites.  Patient status post paracentesis.  Post paracentesis on 03/01/2018 patient noted to be hypotensive.  Renal function fluctuating creatinine currently at 2.23 from 2.15 from 2.63 from 3.32. Increased IV fluids to 100 cc/h.  Follow.   2.  Hyperkalemia Likely secondary to problem #1 versus tumor lysis syndrome as patient noted to have an elevated uric acid  level and elevated phosphorus level.  Status post 1 dose of Kayexalate.  Status post IV rasburicase.  Hyperkalemia resolved.  Potassium at 3.8 this morning.  Follow.  3.  Probable tumor lysis syndrome Patient had presented with acute renal failure noted to be hyperkalemic with a potassium as high as 6.  Uric acid level elevated as well as phosphorus level.  Likely tumor lysis syndrome.  Discussed with oncology.  Status post IV rasburicase.  Patient also on IV fluids.  Patient received a dose of Kayexalate.  Potassium today is 3.8, phosphorus of 4.1 uric acid of 2.5.  Continue supportive care.  Follow.   4.  Hypertension Blood pressure borderline and as such Coreg has been discontinued.  Follow.   5.  Chronic diastolic CHF Euvolemic.  Diuretics on hold secondary to problem #1.  Monitor closely with hydration.  6.  Large ascites Patient states had recent paracentesis done 5 days prior to admission with 11 L removed.  Patient with abdominal distention.  Likely secondary to metastatic small cell lung cancer versus cirrhosis.  Ultrasound-guided therapeutic thoracentesis done on 02/26/2018 with 8.4 L removed and fluid sent for cytology.  Status post IV albumin.  Patient with some abdominal distention.  Patient s/p ultrasound-guided therapeutic paracentesis 03/01/2018 with 7.8 L removed.  Post paracentesis patient was hypotensive and as such given a bolus of IV fluids as well as placed on maintenance IV fluids.  We will give another round of IV albumin..  Follow.   7.  Diabetes mellitus type 2 Hemoglobin A1c was 7.2 on 09/28/2017.  CBG of 160 this morning.  Continued to hold oral hypoglycemic agents.  Continue sliding scale insulin.  Continue home regimen of Lantus 10 units daily.    8.  Possible pneumonia/HCAP Noted on chest x-ray.  Patient currently afebrile however patient immunocompromised.  Patient was empirically on IV aztreonam and IV vancomycin and subsequently transition to IV Levaquin.  Will  treat empirically for 7 days.  Continue supportive care.   9.  Severe protein calorie malnutrition Secondary to metastatic small cell lung cancer.  Patient with poor prognosis.   10.  Anemia/thrombocytopenia Likely secondary to malignancy and chemotherapy.  Patient with no overt bleeding.  Hemoglobin currently at 10.0.  Transfusion threshold hemoglobin less than 7 and platelet count less than 10.  Oncology following.  11.  Extensive stage small cell lung cancer with right upper lobe mass/chronic back pain Patient with some pulmonary nodules and significant mediastinal lymphadenopathy and liver mets diagnosed in June 2019.  Patient status post 5 cycles of chemotherapy and patient noted to be on maintenance treatment with single agent tecentriq first dose was February 24, 2018.  Patient with complaints of back pain secondary to metastatic disease.  Continue home regimen Vicodin.  Continue Lidoderm patch.  Patient with a poor prognosis felt per oncology and recommending palliative care.  Palliative care following.     DVT prophylaxis: SCDs Code Status: Full Family Communication: Updated patient.  No family at bedside. Disposition Plan: Likely to skilled nursing facility with palliative care following.   Consultants:   Oncology: Dr. Lorna Few 02/26/2018  Palliative care consultation Dr. Rowe Pavy 02/27/2018.  Procedures:   Ultrasound-guided paracentesis 02/26/2018----8.4 L removed/03/01/2018-7.8 L of yellow clear fluid removed  CT abdomen and pelvis 02/25/2018  Chest x-ray 02/25/2018    Antimicrobials:  IV aztreonam 02/25/2018>>>>>> 02/28/2018  IV vancomycin 02/25/2018>>>>> 02/28/2018  IV Levaquin 02/28/2018   Subjective: Patient laying in bed.  States improvement with abdominal discomfort after paracentesis 03/01/2018.  Denies any chest pain.  No shortness of breath.   Objective: Vitals:   03/01/18 1534 03/01/18 1742 03/01/18 2045 03/02/18 0502  BP: (!) 87/59 99/60  (!) 102/58 99/60  Pulse: 80 84 84 84  Resp:   20 20  Temp:  (!) 97.5 F (36.4 C) 98.1 F (36.7 C) 97.6 F (36.4 C)  TempSrc:  Oral Oral Oral  SpO2:  97% 96% 100%  Weight:      Height:        Intake/Output Summary (Last 24 hours) at 03/02/2018 1151 Last data filed at 03/02/2018 0907 Gross per 24 hour  Intake 3449.84 ml  Output 975 ml  Net 2474.84 ml   Filed Weights   02/27/18 0458 02/28/18 0620 03/01/18 0500  Weight: 119.7 kg 124.5 kg 127.8 kg    Examination:  General exam: NAD Respiratory system: CTAB anterior lung fields.  No wheezes, no crackles, no rhonchi. Normal respiratory effort.  Cardiovascular system: RRR no murmurs rubs or gallops.  No JVD.  No lower extremity edema.  Gastrointestinal system: Abdomen is distended, soft, decreased tenderness to palpation.  No rebound.  No guarding.  Central nervous system: Alert and oriented. No focal neurological deficits. Extremities: Symmetric 5 x 5 power. Skin: No rashes, lesions or ulcers Psychiatry: Judgement and insight appear normal. Mood & affect flat.     Data Reviewed: I have personally reviewed following labs and imaging studies  CBC: Recent Labs  Lab 02/24/18 0939  02/26/18 0525 02/27/18 0549 02/28/18 0603 03/01/18 0524 03/02/18 0527  WBC 9.9   < > 8.7 6.8 6.0 7.3 6.3  NEUTROABS 7.7  --   --  5.1  --   --   --  HGB 10.8*   < > 10.0* 9.3* 9.6* 10.1* 10.0*  HCT 32.5*   < > 30.4* 28.6* 30.3* 31.8* 31.5*  MCV 104.5*   < > 107.0* 109.2* 109.4* 111.2* 109.8*  PLT 55*   < > 49* 41* 35* 37* 30*   < > = values in this interval not displayed.   Basic Metabolic Panel: Recent Labs  Lab 02/26/18 0812 02/26/18 1757 02/27/18 0549 02/28/18 0603 03/01/18 0524 03/02/18 0527  NA 134*  --  137 138 140 139  K 5.9* 5.3* 4.3 3.9 4.0 3.8  CL 101  --  104 108 110 110  CO2 22  --  22 22 22  21*  GLUCOSE 158*  --  180* 193* 138* 185*  BUN 52*  --  46* 41* 40* 42*  CREATININE 3.68*  --  3.32* 2.63* 2.15* 2.23*    CALCIUM 8.6*  --  8.3* 8.0* 8.4* 8.2*  PHOS 4.8*  --  4.1 4.1  --  4.1   GFR: Estimated Creatinine Clearance: 44.4 mL/min (A) (by C-G formula based on SCr of 2.23 mg/dL (H)). Liver Function Tests: Recent Labs  Lab 02/24/18 0939 02/25/18 1708 02/28/18 0603 03/01/18 0524  AST 55* 62*  --  54*  ALT 31 33  --  32  ALKPHOS 126 115  --  100  BILITOT 2.5* 2.7*  --  4.1*  PROT 6.7 6.2*  --  6.0*  ALBUMIN 2.0* 2.0* 2.6* 2.5*   Recent Labs  Lab 02/25/18 1708  LIPASE 41   Recent Labs  Lab 02/25/18 2258  AMMONIA 55*   Coagulation Profile: Recent Labs  Lab 02/25/18 1708  INR 1.25   Cardiac Enzymes: No results for input(s): CKTOTAL, CKMB, CKMBINDEX, TROPONINI in the last 168 hours. BNP (last 3 results) No results for input(s): PROBNP in the last 8760 hours. HbA1C: No results for input(s): HGBA1C in the last 72 hours. CBG: Recent Labs  Lab 03/01/18 0811 03/01/18 1125 03/01/18 2129 03/02/18 0726 03/02/18 1139  GLUCAP 126* 147* 176* 160* 132*   Lipid Profile: No results for input(s): CHOL, HDL, LDLCALC, TRIG, CHOLHDL, LDLDIRECT in the last 72 hours. Thyroid Function Tests: No results for input(s): TSH, T4TOTAL, FREET4, T3FREE, THYROIDAB in the last 72 hours. Anemia Panel: No results for input(s): VITAMINB12, FOLATE, FERRITIN, TIBC, IRON, RETICCTPCT in the last 72 hours. Sepsis Labs: Recent Labs  Lab 02/25/18 1819  LATICACIDVEN 3.42*    Recent Results (from the past 240 hour(s))  Urine culture     Status: None   Collection Time: 02/25/18  6:34 PM  Result Value Ref Range Status   Specimen Description   Final    URINE, CLEAN CATCH Performed at [facility] [address], [place], [place] [address]    Special Requests   Final    NONE Performed at [facility], [place] [address] [place], [place] [address]    Culture   Final    NO GROWTH Performed at [facility], [place] [address], [place], [place] [address]     Report Status 02/27/2018 FINAL  Final  Blood culture (routine x 2)     Status: None (Preliminary result)   Collection Time: 02/25/18  8:13 PM  Result Value Ref Range Status   Specimen Description   Final    BLOOD LEFT WRIST Performed at [facility], [place] [address] [place],  [address]    Special Requests   Final    BOTTLES DRAWN  AEROBIC AND ANAEROBIC Blood Culture adequate volume Performed at [facility] [address], [place], [place] [address]    Culture   Final    NO GROWTH 3 DAYS Performed at [facility], [place] [address], [place], [place] [address]    Report Status PENDING  Incomplete  Blood culture (routine x 2)     Status: None (Preliminary result)   Collection Time: 02/25/18 10:58 PM  Result Value Ref Range Status   Specimen Description   Final    BLOOD LEFT HAND Performed at [facility], [place] [address] [place], [place] [address]    Special Requests   Final    AEROBIC BOTTLE ONLY Blood Culture results may not be optimal due to an inadequate volume of blood received in culture bottles Performed at [facility] [address], [place], [place] [address]    Culture   Final    NO GROWTH 3 DAYS Performed at [facility], [place] [address], [place], [place] [address]    Report Status PENDING  Incomplete  MRSA PCR Screening     Status: Abnormal   Collection Time: 02/26/18  5:08 AM  Result Value Ref Range Status   MRSA by PCR POSITIVE (A) NEGATIVE Final    Comment:        The GeneXpert MRSA Assay (FDA approved for NASAL specimens only), is one component of a comprehensive MRSA colonization surveillance program. It is not intended to diagnose MRSA infection nor to guide or monitor treatment for MRSA infections. RESULT CALLED TO, READ BACK BY AND VERIFIED WITH: CAMPBELL,M RN AT 7353 02/26/18 BY TIBBITTS,K Performed at [facility], [place] [address] [place], [place] [address]    Gram stain     Status: None   Collection Time: 03/01/18  1:45 PM  Result Value Ref Range Status   Specimen Description PERITONEAL  Final   Special Requests NONE  Final   Gram Stain   Final    FEW WBC PRESENT,BOTH PMN AND MONONUCLEAR NO ORGANISMS SEEN Performed at [facility], [address], [place], [place] [address]    Report Status 03/01/2018 FINAL  Final         Radiology Studies: US Paracentesis  Result Date: 03/01/2018 INDICATION: Patient with history of small cell lung cancer. Recurrent ascites. Request made for diagnostic and therapeutic paracentesis. EXAM: ULTRASOUND GUIDED LEFT LOWER QUADRANT PARACENTESIS MEDICATIONS: None. COMPLICATIONS: None immediate. PROCEDURE: Informed written consent was obtained from the patient after a discussion of the risks, benefits and alternatives to treatment. A timeout was performed prior to the initiation of the procedure. Initial ultrasound scanning demonstrates a large amount of ascites within the left lower abdominal quadrant. The left lower abdomen was prepped and draped in the usual sterile fashion. 1% lidocaine with epinephrine was used for local anesthesia. Following this, a 19 gauge, 10-cm, Yueh catheter was introduced. An ultrasound image was saved for documentation purposes. The paracentesis was performed. The catheter was removed and a dressing was applied. The patient tolerated the procedure well without immediate post procedural complication. FINDINGS: A total of approximately 7.8 L of clear yellow fluid was removed. Samples were sent to the laboratory as requested by the clinical team. IMPRESSION: Successful ultrasound-guided paracentesis yielding 7.8 liters of peritoneal fluid. Read by: Ascencion Dike PA-C Electronically Signed   By: Markus Daft M.D.   On: 03/01/2018 13:55        Scheduled Meds: . Chlorhexidine Gluconate  Cloth  6 each Topical [id number]  . insulin aspart  0-9 Units Subcutaneous TID WC  . insulin glargine  10  Units Subcutaneous QHS  . lidocaine  1 patch Transdermal Q24H  . mupirocin ointment  1 application Nasal BID  . omega-3 acid ethyl esters  1 g Oral QHS  . pantoprazole  40 mg Oral Daily  . polyethylene glycol  17 g Oral Daily  . vitamin B-12  100 mcg Oral Daily   Continuous Infusions: . sodium chloride 100 mL/hr at 03/02/18 0200  . albumin human 25 g (03/02/18 0936)  . levofloxacin (LEVAQUIN) IV 750 mg (02/28/18 1351)     LOS: 5 days    Time spent: 40 minutes    Irine Seal, MD [facility] Pager [phone]  If 7PM-7AM, please contact night-coverage [email or web address] Password [email or web address] 03/02/2018, 11:51 AM

## 2018-03-03 ENCOUNTER — Encounter (HOSPITAL_COMMUNITY): Payer: Self-pay

## 2018-03-03 DIAGNOSIS — I5032 Chronic diastolic (congestive) heart failure: Secondary | ICD-10-CM

## 2018-03-03 LAB — CULTURE, BLOOD (ROUTINE X 2)
CULTURE: NO GROWTH
Culture: NO GROWTH
SPECIAL REQUESTS: ADEQUATE

## 2018-03-03 LAB — BASIC METABOLIC PANEL
ANION GAP: 9 (ref 5–15)
BUN: 38 mg/dL — ABNORMAL HIGH (ref 8–23)
CALCIUM: 8.4 mg/dL — AB (ref 8.9–10.3)
CHLORIDE: 113 mmol/L — AB (ref 98–111)
CO2: 17 mmol/L — ABNORMAL LOW (ref 22–32)
Creatinine, Ser: 1.8 mg/dL — ABNORMAL HIGH (ref 0.61–1.24)
GFR calc non Af Amer: 36 mL/min — ABNORMAL LOW (ref >60)
GFR, EST AFRICAN AMERICAN: 42 mL/min — AB (ref >60)
Glucose, Bld: 153 mg/dL — ABNORMAL HIGH (ref 70–99)
Potassium: 4 mmol/L (ref 3.5–5.1)
Sodium: 139 mmol/L (ref 135–145)

## 2018-03-03 LAB — GLUCOSE, CAPILLARY
GLUCOSE-CAPILLARY: 242 mg/dL — AB (ref 70–99)
GLUCOSE-CAPILLARY: 156 mg/dL — AB (ref 70–99)
Glucose-Capillary: 149 mg/dL — ABNORMAL HIGH (ref 70–99)
Glucose-Capillary: 198 mg/dL — ABNORMAL HIGH (ref 70–99)

## 2018-03-03 LAB — HEMOGLOBIN AND HEMATOCRIT, BLOOD
HEMATOCRIT: 30.1 % — AB (ref 39.0–52.0)
Hemoglobin: 9.6 g/dL — ABNORMAL LOW (ref 13.0–17.0)

## 2018-03-03 MED ORDER — LEVOFLOXACIN IN D5W 750 MG/150ML IV SOLN
750.0000 mg | INTRAVENOUS | Status: AC
  Administered 2018-03-03 – 2018-03-04 (×2): 750 mg via INTRAVENOUS
  Filled 2018-03-03 (×2): qty 150

## 2018-03-03 MED ORDER — SODIUM BICARBONATE 650 MG PO TABS
650.0000 mg | ORAL_TABLET | Freq: Two times a day (BID) | ORAL | Status: AC
  Administered 2018-03-03 – 2018-03-04 (×4): 650 mg via ORAL
  Filled 2018-03-03 (×4): qty 1

## 2018-03-03 MED ORDER — SPIRONOLACTONE 25 MG PO TABS
25.0000 mg | ORAL_TABLET | Freq: Every day | ORAL | Status: DC
  Administered 2018-03-03 – 2018-03-08 (×6): 25 mg via ORAL
  Filled 2018-03-03 (×6): qty 1

## 2018-03-03 MED ORDER — FUROSEMIDE 40 MG PO TABS
40.0000 mg | ORAL_TABLET | Freq: Every day | ORAL | Status: DC
  Administered 2018-03-03 – 2018-03-08 (×5): 40 mg via ORAL
  Filled 2018-03-03 (×6): qty 1

## 2018-03-03 NOTE — Progress Notes (Signed)
PT Cancellation Note  Patient Details Name: Robert Compton MRN: [id number] DOB: October 22, 1945   Cancelled Treatment:    Reason Eval/Treat Not Completed: Medical issues which prohibited therapy; patient relates severe nausea and unable to tolerate mobility today despite nausea medications.  Agreeable to try another day.   Reginia Naas 03/03/2018, 12:29 PM  Magda Kiel, [place] [phone] 03/03/2018

## 2018-03-03 NOTE — Progress Notes (Signed)
PROGRESS NOTE    Robert Compton  LOV:[id number] DOB: 1945/07/15 DOA: 02/25/2018 PCP: [name], MD    Brief Narrative:  Patient is a 72 year old male history of extensive stage small cell lung cancer started on immunotherapy, history of hypertension, diabetes type 2 presented to the ED with increasing weakness and increasing abdominal distention.  Patient noted to be in acute renal failure, hyperkalemic with a potassium of 6, chest x-ray showing possible infiltrates.  Patient admitted.  Placed on IV fluids.  Ultrasound-guided paracentesis ordered.  Oncology consulted.   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  Creatinine on admission was 4.03 from 1.20 on 02/14/2018.  Patient per admitting physician noted to have some episodes of diarrhea.  Blood pressure borderline.  Urine sodium was less than 10.  Renal ultrasound which was done with sonographically normal kidneys and moderate ascites.  Patient status post paracentesis.  Post paracentesis on 03/01/2018 patient noted to be hypotensive.  Renal function fluctuating creatinine currently at 1.80 from 2.23 from 2.15 from 2.63 from 3.32.  Saline lock IV fluids.  Will place on diuretics due to recurrent ascites.  Monitor renal function closely.  If blood pressure drops and renal function worsens  may need midodrine for renal support.  Follow.   2.  Hyperkalemia Likely secondary to problem #1 versus tumor lysis syndrome as patient noted to have an elevated uric acid level and elevated phosphorus level.  Status post 1 dose of Kayexalate.  Status post IV rasburicase.  Hyperkalemia resolved.  Potassium at 4 this morning.  Follow.  3.  Probable tumor lysis syndrome Patient had presented with acute renal failure noted to be hyperkalemic with a potassium as high as 6.  Uric acid level elevated as well as phosphorus level.  Likely tumor lysis syndrome.  Discussed with oncology.  Status post IV rasburicase.  Patient also on IV fluids.  Patient received a dose of Kayexalate.  Potassium today is 3.8, phosphorus of 4.1 uric acid of 2.5.  Continue supportive care.  Follow.   4.  Hypertension Blood pressure borderline and as such Coreg has been discontinued.  Patient will be resumed back on diuretics.  Follow blood pressure and if blood pressure is low may need to be placed on Midodrin.  Follow.   5.  Chronic diastolic CHF Euvolemic.  Saline lock IV fluids.  Place on diuretics.    6.  Large ascites Patient states had recent paracentesis done 7 days prior to admission with 11 L removed.  Patient with abdominal distention.  Likely secondary to cirrhosis versus metastatic small cell lung cancer.  Ultrasound-guided therapeutic thoracentesis done on 02/26/2018 with 8.4 L removed and fluid sent for cytology which was negative for malignant cells.  Status post IV albumin.  Patient with abdominal distention.  Patient s/p ultrasound-guided therapeutic paracentesis 03/01/2018 with 7.8 L removed.  Post paracentesis patient was hypotensive and as  such given a bolus of IV fluids as well as placed on maintenance IV fluids.  Patient on IV albumin.  Patient now with abdominal distention requiring another therapeutic ultrasound-guided thoracentesis.  SAAG > 1 and likely secondary to portal hypertension/cirrhosis.  Saline  lock IV fluids.  Placed on Lasix 40 mg daily and spironolactone 25 mg daily.  Monitor blood pressure and if blood pressure drops less than 90 we will need to place on Midodrin.  If continued abdominal distention patient may need a Pleurx tunneled catheter however will need to discuss with IR as patient having multiple paracentesis every few days. Follow.   7.  Diabetes mellitus type 2 Hemoglobin A1c was 7.2 on 09/28/2017.  CBG of 149 this morning.  Continued to hold oral hypoglycemic agents.  Continue sliding scale insulin.  Continue home regimen of Lantus 10 units daily.    8.  Possible pneumonia/HCAP Noted on chest x-ray.  Patient currently afebrile however patient immunocompromised.  Patient was empirically on IV aztreonam and IV vancomycin and subsequently transitioned to IV Levaquin.  Will treat empirically for 7 days.  Continue supportive care.   9.  Severe protein calorie malnutrition Secondary to metastatic small cell lung cancer.  Patient with poor prognosis.   10.  Anemia/thrombocytopenia Likely secondary to malignancy and chemotherapy.  Patient with no overt bleeding.  Hemoglobin currently at 9.6.  Transfusion threshold hemoglobin less than 7 and platelet count less than 10.  Oncology following.  11.  Extensive stage small cell lung cancer with right upper lobe mass/chronic back pain Patient with some pulmonary nodules and significant mediastinal lymphadenopathy and liver mets diagnosed in June 2019.  Patient status post 5 cycles of chemotherapy and patient noted to be on maintenance treatment with single agent tecentriq first dose was February 24, 2018.  Patient with complaints of back pain secondary to metastatic disease.  Continue home regimen Vicodin.  Continue Lidoderm patch.  Patient with a poor prognosis felt per oncology and recommending palliative care.  Palliative care following.     DVT prophylaxis: SCDs Code Status: Full Family Communication: Updated patient.  No family at  bedside. Disposition Plan: Likely to skilled nursing facility with palliative care following.   Consultants:   Oncology: Dr. Lorna Few 02/26/2018  Palliative care consultation Dr. Rowe Pavy 02/27/2018.  Procedures:   Ultrasound-guided paracentesis 02/26/2018----8.4 L removed/03/01/2018-7.8 L of yellow clear fluid removed  CT abdomen and pelvis 02/25/2018  Chest x-ray 02/25/2018    Antimicrobials:  IV aztreonam 02/25/2018>>>>>> 02/28/2018  IV vancomycin 02/25/2018>>>>> 02/28/2018  IV Levaquin 02/28/2018   Subjective: Patient laying in bed.  Denies any chest pain no shortness of breath.  With abdominal distention.    Objective: Vitals:   03/02/18 0502 03/02/18 1313 03/02/18 2047 03/03/18 0504  BP: 99/60 104/61 113/67 118/69  Pulse: 84 80 84 92  Resp: 20 20 20 20   Temp: 97.6 F (36.4 C) 97.7 F (36.5 C) 98 F (36.7 C) 97.8 F (36.6 C)  TempSrc: Oral Oral Oral Oral  SpO2: 100% 95% 98% 96%  Weight:      Height:        Intake/Output Summary (Last 24 hours) at 03/03/2018 1209 Last data filed at 03/03/2018 0835 Gross per 24 hour  Intake 800 ml  Output 1350 ml  Net -550 ml   Filed Weights   02/27/18 0458 02/28/18 0620 03/01/18 0500  Weight: 119.7 kg 124.5 kg 127.8 kg    Examination:  General exam: NAD Respiratory system: Lungs clear to auscultation bilaterally anterior  lung fields.  No wheezes, no crackles, no rhonchi.  Normal respiratory effort.  Cardiovascular system: Regular rate rhythm no murmurs rubs or gallops.  No JVD.  No lower extremity edema. Gastrointestinal system: Abdomen is distended, somewhat tight, decreased tenderness to palpation.  No rebound.  No guarding.  Central nervous system: Alert and oriented. No focal neurological deficits. Extremities: Symmetric 5 x 5 power. Skin: No rashes, lesions or ulcers Psychiatry: Judgement and insight appear normal. Mood & affect flat.     Data Reviewed: I have personally reviewed following labs  and imaging studies  CBC: Recent Labs  Lab 02/26/18 0525 02/27/18 0549 02/28/18 0603 03/01/18 0524 03/02/18 0527 03/03/18 0536  WBC 8.7 6.8 6.0 7.3 6.3  --   NEUTROABS  --  5.1  --   --   --   --   HGB 10.0* 9.3* 9.6* 10.1* 10.0* 9.6*  HCT 30.4* 28.6* 30.3* 31.8* 31.5* 30.1*  MCV 107.0* 109.2* 109.4* 111.2* 109.8*  --   PLT 49* 41* 35* 37* 30*  --    Basic Metabolic Panel: Recent Labs  Lab 02/26/18 0812  02/27/18 0549 02/28/18 0603 03/01/18 0524 03/02/18 0527 03/03/18 0536  NA 134*  --  137 138 140 139 139  K 5.9*   < > 4.3 3.9 4.0 3.8 4.0  CL 101  --  104 108 110 110 113*  CO2 22  --  22 22 22  21* 17*  GLUCOSE 158*  --  180* 193* 138* 185* 153*  BUN 52*  --  46* 41* 40* 42* 38*  CREATININE 3.68*  --  3.32* 2.63* 2.15* 2.23* 1.80*  CALCIUM 8.6*  --  8.3* 8.0* 8.4* 8.2* 8.4*  PHOS 4.8*  --  4.1 4.1  --  4.1  --    < > = values in this interval not displayed.   GFR: Estimated Creatinine Clearance: 55 mL/min (A) (by C-G formula based on SCr of 1.8 mg/dL (H)). Liver Function Tests: Recent Labs  Lab 02/25/18 1708 02/28/18 0603 03/01/18 0524  AST 62*  --  54*  ALT 33  --  32  ALKPHOS 115  --  100  BILITOT 2.7*  --  4.1*  PROT 6.2*  --  6.0*  ALBUMIN 2.0* 2.6* 2.5*   Recent Labs  Lab 02/25/18 1708  LIPASE 41   Recent Labs  Lab 02/25/18 2258  AMMONIA 55*   Coagulation Profile: Recent Labs  Lab 02/25/18 1708  INR 1.25   Cardiac Enzymes: No results for input(s): CKTOTAL, CKMB, CKMBINDEX, TROPONINI in the last 168 hours. BNP (last 3 results) No results for input(s): PROBNP in the last 8760 hours. HbA1C: No results for input(s): HGBA1C in the last 72 hours. CBG: Recent Labs  Lab 03/02/18 0726 03/02/18 1139 03/02/18 1621 03/02/18 2136 03/03/18 0749  GLUCAP 160* 132* 117* 142* 149*   Lipid Profile: No results for input(s): CHOL, HDL, LDLCALC, TRIG, CHOLHDL, LDLDIRECT in the last 72 hours. Thyroid Function Tests: No results for input(s): TSH,  T4TOTAL, FREET4, T3FREE, THYROIDAB in the last 72 hours. Anemia Panel: No results for input(s): VITAMINB12, FOLATE, FERRITIN, TIBC, IRON, RETICCTPCT in the last 72 hours. Sepsis Labs: Recent Labs  Lab 02/25/18 1819  LATICACIDVEN 3.42*    Recent Results (from the past 240 hour(s))  Urine culture     Status: None   Collection Time: 02/25/18  6:34 PM  Result Value Ref Range Status   Specimen Description   Final    URINE, CLEAN CATCH Performed  at [facility], [place] [address], [place], [place] [address]    Special Requests   Final    NONE Performed at [facility], [place] [address] [place], [place] [address]    Culture   Final    NO GROWTH Performed at [facility], [place] [address], [place], [place] [address]    Report Status 02/27/2018 FINAL  Final  Blood culture (routine x 2)     Status: None   Collection Time: 02/25/18  8:13 PM  Result Value Ref Range Status   Specimen Description   Final    BLOOD LEFT WRIST Performed at [facility], [place] [address], [place], [place] [address]    Special Requests   Final    BOTTLES DRAWN AEROBIC AND ANAEROBIC Blood Culture adequate volume Performed at [place] [address] [place], [place] [address]    Culture   Final    NO GROWTH 5 DAYS Performed at [facility], [place] [address], [place], [place] [address]    Report Status 03/03/2018 FINAL  Final  Blood culture (routine x 2)     Status: None   Collection Time: 02/25/18 10:58 PM  Result Value Ref Range Status   Specimen Description   Final    BLOOD LEFT HAND Performed at [facility], [place] [address] [place], [place] [address]    Special Requests   Final    AEROBIC BOTTLE ONLY Blood Culture results may not be optimal due to an inadequate volume of blood received in culture bottles Performed at [place] [address], [place], [place] [address]    Culture   Final    NO  GROWTH 5 DAYS Performed at [facility], [place] [address] [place], [place] [address]    Report Status 03/03/2018 FINAL  Final  MRSA PCR Screening     Status: Abnormal   Collection Time: 02/26/18  5:08 AM  Result Value Ref Range Status   MRSA by PCR POSITIVE (A) NEGATIVE Final    Comment:        The GeneXpert MRSA Assay (FDA approved for NASAL specimens only), is one component of a comprehensive MRSA colonization surveillance program. It is not intended to diagnose MRSA infection nor to guide or monitor treatment for MRSA infections. RESULT CALLED TO, READ BACK BY AND VERIFIED WITH: CAMPBELL,M RN AT 2595 02/26/18 BY TIBBITTS,K Performed at [facility], [place] [address] [place], [place] [address]   Culture, body fluid-bottle     Status: None (Preliminary result)   Collection Time: 03/01/18  1:45 PM  Result Value Ref Range Status   Specimen Description PERITONEAL  Final   Special Requests NONE  Final   Culture   Final    NO GROWTH 2 DAYS Performed at [facility], [address] [place], [place] [address]    Report Status PENDING  Incomplete  Gram stain     Status: None   Collection Time: 03/01/18  1:45 PM  Result Value Ref Range Status   Specimen Description PERITONEAL  Final   Special Requests NONE  Final   Gram Stain   Final    FEW WBC PRESENT,BOTH PMN AND MONONUCLEAR NO ORGANISMS SEEN Performed at [facility], [address], [place], [place] [address]    Report Status 03/01/2018 FINAL  Final         Radiology Studies: US Paracentesis  Result Date: 03/01/2018 INDICATION: Patient with history of small cell lung cancer. Recurrent ascites. Request made for diagnostic and therapeutic paracentesis. EXAM: ULTRASOUND GUIDED LEFT LOWER QUADRANT PARACENTESIS MEDICATIONS: None. COMPLICATIONS: None immediate. PROCEDURE: Informed written consent was obtained from the patient after a discussion of the risks, benefits and alternatives to  treatment. A timeout was performed prior to the initiation of the procedure. Initial ultrasound scanning demonstrates a large amount of ascites within the left lower abdominal quadrant. The left lower abdomen was prepped and draped in the usual sterile fashion. 1% lidocaine with epinephrine was used for local anesthesia. Following this, a 19 gauge, 10-cm, Yueh catheter was introduced. An ultrasound image was saved for documentation purposes. The paracentesis was performed. The catheter was removed and a dressing was applied. The patient tolerated the procedure well without immediate post procedural complication. FINDINGS: A total of approximately 7.8 L of clear yellow fluid was removed. Samples were sent to the laboratory as requested by the clinical team. IMPRESSION: Successful ultrasound-guided paracentesis yielding 7.8 liters of peritoneal fluid. Read by: Ascencion Dike PA-C Electronically Signed   By: Markus Daft M.D.   On: 03/01/2018 13:55        Scheduled Meds: . insulin aspart  0-9 Units Subcutaneous TID WC  . insulin glargine  10 Units Subcutaneous QHS  . lactulose  20 g Oral TID  . lidocaine  1 patch Transdermal Q24H  . omega-3 acid ethyl esters  1 g Oral QHS  . pantoprazole  40 mg Oral Daily  . polyethylene glycol  17 g Oral Daily  . sodium bicarbonate  650 mg Oral BID  . vitamin B-12  100 mcg Oral Daily   Continuous Infusions: . sodium chloride Stopped (03/03/18 0440)  . albumin human 25 g (03/03/18 0847)  . levofloxacin (LEVAQUIN) IV       LOS: 6 days    Time spent: 40 minutes    Irine Seal, MD [facility] Pager [phone]  If 7PM-7AM, please contact night-coverage [email or web address] Password [email or web address] 03/03/2018, 12:09 PM

## 2018-03-03 NOTE — Progress Notes (Signed)
   03/03/18 1634  Clinical Encounter Type  Visited With Patient  Visit Type Follow-up  Referral From Chaplain  Consult/Referral To Chaplain  The chaplain followed up with spiritual care as requested by Pt.  The Pt. made an effort to communicate with the chaplain, but the Pt. later decided tomorrow may be better.  Both Pt. and chaplain agreed to try again on Tuesday.

## 2018-03-03 NOTE — Progress Notes (Signed)
Pharmacy Brief Note:  Dose of levofloxacin changed from 750 mg IV q48h to 750 mg IV q24h given improvement in renal function. Per protocol.  Lenis Noon, PharmD 03/03/18 8:06 AM

## 2018-03-03 NOTE — Progress Notes (Signed)
Upon morning assessment patient states "If I had a gun I would put a bullet in my head." Nurse reminded patient how serious we interpret these statements and reassures nurse he does not mean it and was only joking. However the charge nurse was called to assess patient and thoughts/SI. The patient confirmed that he was only joking and is not having thoughts of harming himself nor does he have a gun. The charge nurse and nurse deemed patient safe. Will continue to assess and monitor closely.

## 2018-03-03 NOTE — Progress Notes (Signed)
Daily Progress Note   Patient Name: Robert Compton       Date: 03/03/2018 DOB: [birth year]  Age: 72 y.o. MRN#: [id number] Attending Physician: Eugenie Filler, MD Primary Care Physician: [name], MD Admit Date: 02/25/2018  Reason for Consultation/Follow-up: Establishing goals of care  Subjective: Patient is resting in bed. Discussed with bedside RN before entering the patient's room. Patient required paracenteses and 8 L of ascitic fluid was removed. However, patient within the next 24 hours after paracenteses appears with abdominal distention and abdominal pain and back pain and coughing. He has declining oral intake. His pastor has been visiting and is present at the bedside.  See below  Length of Stay: 6  Current Medications: Scheduled Meds:  . insulin aspart  0-9 Units Subcutaneous TID WC  . insulin glargine  10 Units Subcutaneous QHS  . lactulose  20 g Oral TID  . lidocaine  1 patch Transdermal Q24H  . omega-3 acid ethyl esters  1 g Oral QHS  . pantoprazole  40 mg Oral Daily  . polyethylene glycol  17 g Oral Daily  . sodium bicarbonate  650 mg Oral BID  . vitamin B-12  100 mcg Oral Daily    Continuous Infusions: . sodium chloride Stopped (03/03/18 0440)  . albumin human 25 g (03/03/18 0847)  . levofloxacin (LEVAQUIN) IV      PRN Meds: acetaminophen **OR** acetaminophen, HYDROmorphone (DILAUDID) injection, ondansetron **OR** ondansetron (ZOFRAN) IV, oxyCODONE  Physical Exam         Regular breathing S 1 S 2 Complaints of back pain Has some edema, has some venous stasis changes both lower extremities Abdomen appears distended Awake alert but flat in affect  Appears weaker  Vital Signs: BP 118/69 (BP Location: Left Arm)   Pulse 92   Temp 97.8 F (36.6 C)  (Oral)   Resp 20   Ht 6\' 3"  (1.905 m)   Wt 127.8 kg   SpO2 96%   BMI 35.22 kg/m  SpO2: SpO2: 96 % O2 Device: O2 Device: Room Air O2 Flow Rate: O2 Flow Rate (L/min): 2 L/min  Intake/output summary:   Intake/Output Summary (Last 24 hours) at 03/03/2018 1220 Last data filed at 03/03/2018 0835 Gross per 24 hour  Intake 800 ml  Output 1350 ml  Net -550 ml   LBM: Last  BM Date: 03/02/18 Baseline Weight: Weight: 118.8 kg Most recent weight: Weight: 127.8 kg       Palliative Assessment/Data:      Patient Active Problem List   Diagnosis Date Noted  . Palliative care by specialist   . Goals of care, counseling/discussion   . Hyperkalemia 02/26/2018  . 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 Ascites   . HCAP (healthcare-associated pneumonia)   . Acute renal failure (ARF) ([place]) 02/25/2018  . Anasarca 01/11/2018  . Chronic diastolic CHF (congestive heart failure) ([place]) 01/11/2018  . Abdominal pain 01/11/2018  . Elevated lactic acid level 01/11/2018  . Malnutrition of moderate degree 12/20/2017  . SBP (spontaneous bacterial peritonitis) ([place])   . Abdominal distention   . Hypervolemia   . Edema 12/13/2017  . Severe protein-calorie malnutrition ([place]) 12/13/2017  . Abnormal liver function 12/13/2017  . Leukocytosis 12/13/2017  . Acute on chronic diastolic (congestive) heart failure ([place]) 11/27/2017  . Tachycardia 11/26/2017  . Anemia 11/26/2017  . Leg edema, right 11/26/2017  . Dehydration   . Pressure injury of skin 11/09/2017  . Hyperbilirubinemia   . Liver metastasis ([place])   . Neutropenic fever ([place])   . Pancytopenia ([place])   . Febrile neutropenia ([place]) 11/08/2017  . Severe sepsis ([place]) 11/08/2017  . Antineoplastic chemotherapy induced pancytopenia ([place]) 11/08/2017  . AKI (acute kidney injury) ([place]) 11/08/2017  . Encounter for antineoplastic chemotherapy   . Extensive stage primary small cell carcinoma of lung ([place]) 10/03/2017  .  Liver masses   . Mass of upper lobe of right lung 09/28/2017  . Hyponatremia 09/27/2017  . Failure to thrive in adult 09/27/2017  . Metastatic cancer to liver ([place]) 09/27/2017  . Diabetes mellitus without complication ([place]) 40/98/1191  . Essential hypertension 09/27/2017    Palliative Care Assessment & Plan   Patient Profile:    Assessment:  72 year old gentleman who lives in [place], [place] with his son. He also has a daughter Robert Compton [phone].    Patient was diagnosed with extensive stage small cell lung cancer in June 2019. He is status post 5 cycles of systemic chemotherapy with partial response however recently was found to have evidence of disease progression.   Patient has been admitted to hospital medicine service with acute renal failure, probable tumor lysis syndrome. He has a recurrent large ascites and has undergone paracenteses and has also been given IV albumin. He is on broad-spectrum antibiotics for possible pneumonia/healthcare associated pneumonia. He also has severe protein calorie malnutrition.  Patient was seen and evaluated by his medical oncologist with final recommendations for the patient to consider palliative care and hospice services.  Palliative medicine team has been following for goals of care discussions. Patient was also seen and evaluated by radiation oncology, appreciate their input and recommendations.  Patient required titration of his opioids, now on IV Dilaudid PRN, now on PO PRN Oxy IR. Requiring paracentesis frequently. Generalized weakness and overall decline continues.   Recommendations/Plan:   remains full code, as of now, ongoing discussions with patient and daughter [name] about DNR DNI. Patient is decisional, in my opinion and while daughter understands that the patient will not benefit from a resuscitative attempt, in the face of progressive cancer, she wishes for the patient to make this decision himself.   Ongoing discussions  with patient and daughter about home with hospice versus SNF rehab with palliative care, daughter states that the patient reportedly has gone to [facility] for  rehabilitation a few months ago and they do not like the experience of being inside a facility.  Will recommend consideration for peritoneal drain catheter placement, due to recurrent ascites requiring paracentesis.   Appreciate patient's pastor visiting with patient, hospital spiritual care also following.    Code Status:    Code Status Orders  (From admission, onward)         Start     Ordered   02/25/18 2025  Full code  Continuous     02/25/18 2028        Code Status History    Date Active Date Inactive Code Status Order ID Comments User Context   01/11/2018 0120 01/18/2018 1652 Full Code [id number]  Ivor Costa, MD ED   12/14/2017 0005 12/24/2017 2208 Full Code [id number]  [name], MD ED   11/26/2017 2055 12/02/2017 1558 Full Code [id number]  [name], MD Inpatient   11/08/2017 2005 11/12/2017 1833 Full Code [id number]  [name], DO ED   09/27/2017 2346 10/08/2017 2258 Full Code [id number]  [name], MD Inpatient       Prognosis:   < 3 months  Discharge Planning:  [facility] for rehab with Palliative care service follow-up Versus home with hospice discussions are being undertaken. Would recommend that the patient consider skilled nursing facility for his safety and for a trial of physical therapy deemed appropriate. Ongoing discussions to continue.  Care plan was discussed with Patient, bedside RN   Thank you for allowing the Palliative Medicine Team to assist in the care of this patient.   Time In: 11 Time Out: 11.35 Total Time 35 Prolonged Time Billed  no       Greater than 50%  of this time was spent counseling and coordinating care related to the above assessment and plan.  Loistine Chance, MD [phone] Please contact Palliative Medicine Team phone at [phone] for questions and  concerns.

## 2018-03-04 ENCOUNTER — Inpatient Hospital Stay (HOSPITAL_COMMUNITY): Payer: BLUE CROSS/BLUE SHIELD

## 2018-03-04 ENCOUNTER — Encounter (HOSPITAL_COMMUNITY): Payer: Self-pay

## 2018-03-04 DIAGNOSIS — R188 Other ascites: Secondary | ICD-10-CM

## 2018-03-04 DIAGNOSIS — K7469 Other cirrhosis of liver: Secondary | ICD-10-CM

## 2018-03-04 LAB — CBC WITH DIFFERENTIAL/PLATELET
Abs Immature Granulocytes: 0.25 10*3/uL — ABNORMAL HIGH (ref 0.00–0.07)
BASOS PCT: 0 %
Basophils Absolute: 0 10*3/uL (ref 0.0–0.1)
EOS ABS: 0.1 10*3/uL (ref 0.0–0.5)
Eosinophils Relative: 1 %
HEMATOCRIT: 28.3 % — AB (ref 39.0–52.0)
Hemoglobin: 9.1 g/dL — ABNORMAL LOW (ref 13.0–17.0)
IMMATURE GRANULOCYTES: 4 %
Lymphocytes Relative: 11 %
Lymphs Abs: 0.7 10*3/uL (ref 0.7–4.0)
MCH: 35.1 pg — ABNORMAL HIGH (ref 26.0–34.0)
MCHC: 32.2 g/dL (ref 30.0–36.0)
MCV: 109.3 fL — AB (ref 80.0–100.0)
MONOS PCT: 10 %
Monocytes Absolute: 0.6 10*3/uL (ref 0.1–1.0)
NEUTROS PCT: 74 %
NRBC: 0 % (ref 0.0–0.2)
Neutro Abs: 4.6 10*3/uL (ref 1.7–7.7)
Platelets: 23 10*3/uL — CL (ref 150–400)
RBC: 2.59 MIL/uL — ABNORMAL LOW (ref 4.22–5.81)
RDW: 19.2 % — AB (ref 11.5–15.5)
WBC: 6.2 10*3/uL (ref 4.0–10.5)

## 2018-03-04 LAB — COMPREHENSIVE METABOLIC PANEL WITH GFR
ALT: 24 U/L (ref 0–44)
AST: 43 U/L — ABNORMAL HIGH (ref 15–41)
Albumin: 3.1 g/dL — ABNORMAL LOW (ref 3.5–5.0)
Alkaline Phosphatase: 91 U/L (ref 38–126)
Anion gap: 10 (ref 5–15)
BUN: 41 mg/dL — ABNORMAL HIGH (ref 8–23)
CO2: 18 mmol/L — ABNORMAL LOW (ref 22–32)
Calcium: 9 mg/dL (ref 8.9–10.3)
Chloride: 112 mmol/L — ABNORMAL HIGH (ref 98–111)
Creatinine, Ser: 1.78 mg/dL — ABNORMAL HIGH (ref 0.61–1.24)
GFR calc Af Amer: 43 mL/min — ABNORMAL LOW
GFR calc non Af Amer: 37 mL/min — ABNORMAL LOW
Glucose, Bld: 154 mg/dL — ABNORMAL HIGH (ref 70–99)
Potassium: 4 mmol/L (ref 3.5–5.1)
Sodium: 140 mmol/L (ref 135–145)
Total Bilirubin: 4.3 mg/dL — ABNORMAL HIGH (ref 0.3–1.2)
Total Protein: 6 g/dL — ABNORMAL LOW (ref 6.5–8.1)

## 2018-03-04 LAB — GLUCOSE, CAPILLARY
GLUCOSE-CAPILLARY: 148 mg/dL — AB (ref 70–99)
GLUCOSE-CAPILLARY: 165 mg/dL — AB (ref 70–99)
Glucose-Capillary: 125 mg/dL — ABNORMAL HIGH (ref 70–99)
Glucose-Capillary: 158 mg/dL — ABNORMAL HIGH (ref 70–99)
Glucose-Capillary: 255 mg/dL — ABNORMAL HIGH (ref 70–99)
Glucose-Capillary: 159 mg/dL — ABNORMAL HIGH (ref 70–99)

## 2018-03-04 MED ORDER — LIDOCAINE HCL 1 % IJ SOLN
INTRAMUSCULAR | Status: AC
  Filled 2018-03-04: qty 10

## 2018-03-04 NOTE — Progress Notes (Signed)
PT Cancellation Note  Patient Details Name: Chi Garlow MRN: [id number] DOB: 11/16/1945   Cancelled Treatment:     pt is awaiting paracentesis, will check back as schedule permits.    Rica Koyanagi  PTA Acute  Rehabilitation Services Pager      [phone] Office      [phone]

## 2018-03-04 NOTE — Progress Notes (Signed)
CRITICAL VALUE ALERT  Critical Value:  Platelets  23  Date & Time Notied:  03/04/18 4975  Provider Notified: NP schorr  Orders Received/Actions taken: awaiting orders

## 2018-03-04 NOTE — Progress Notes (Signed)
PROGRESS NOTE    Robert Compton  HDQ:[id number] DOB: 1945/10/31 DOA: 02/25/2018 PCP: [name], MD    Brief Narrative:  Patient is a 72 year old male history of extensive stage small cell lung cancer started on immunotherapy, history of hypertension, diabetes type 2 presented to the ED with increasing weakness and increasing abdominal distention.  Patient noted to be in acute renal failure, hyperkalemic with a potassium of 6, chest x-ray showing possible infiltrates.  Patient admitted.  Placed on IV fluids.  Ultrasound-guided paracentesis ordered.  Oncology consulted.   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  Creatinine on admission was 4.03 from 1.20 on 02/14/2018.  Patient per admitting physician noted to have some episodes of diarrhea.  Blood pressure borderline.  Urine sodium was less than 10.  Renal ultrasound which was done with sonographically normal kidneys and moderate ascites.  Patient status post paracentesis.  Post paracentesis on 03/01/2018 patient noted to be hypotensive.  Renal function fluctuating creatinine currently at 1.78 from 1.80 from 2.23 from 2.15 from 2.63 from 3.32.  Saline lock IV fluids.  Patient started on Lasix and spironolactone due to recurrent ascites and concern for cirrhosis being the etiology.  Monitor  renal function closely.  If blood pressure drops and renal function worsens may need midodrine for renal support.  Follow.   2.  Hyperkalemia Likely secondary to problem #1 versus tumor lysis syndrome as patient noted to have an elevated uric acid level and elevated phosphorus level.  Status post 1 dose of Kayexalate.  Status post IV rasburicase.  Hyperkalemia resolved.  Potassium at 4 this morning.  Follow.  3.  Probable tumor lysis syndrome Patient had presented with acute renal failure noted to be hyperkalemic with a potassium as high as 6.  Uric acid level elevated as well as phosphorus level.  Likely tumor lysis syndrome.  Discussed with oncology.  Status post IV rasburicase.  Patient also on IV fluids.  Patient received a dose of Kayexalate.  Potassium today is 3.8, phosphorus of 4.1 uric acid of 2.5.  Continue supportive care.  Follow.   4.  Hypertension Blood pressure borderline and as such Coreg has been discontinued.  Patient will be resumed back on diuretics.  Follow blood pressure and if blood pressure is low may need to be placed on Midodrin.  Follow.   5.  Chronic diastolic CHF Euvolemic.  Saline lock IV fluids.  Patient started on diuretics due to recurrent abdominal ascites.  6.  Large ascites likely secondary to cirrhosis Patient states had recent paracentesis done 7 days prior to admission with 11 L removed.  Patient with abdominal distention.  Likely secondary to cirrhosis versus metastatic small cell lung cancer.  Ultrasound-guided therapeutic thoracentesis done on 02/26/2018 with 8.4 L removed and fluid sent for cytology which was negative for malignant cells.  Status post IV albumin.  Patient with abdominal distention.  Patient s/p ultrasound-guided therapeutic paracentesis 03/01/2018 with 7.8 L removed.  Post paracentesis patient was hypotensive and as such given a bolus of IV fluids as well as placed on maintenance IV fluids.  Patient on IV albumin.  Patient now with worsening  abdominal distention requiring another therapeutic ultrasound-guided thoracentesis.  SAAG > 1 and likely secondary to portal hypertension/cirrhosis.  Saline lock IV fluids.  Awaiting ultrasound-guided paracentesis today.  Patient started on Lasix 40 mg daily and spironolactone 25 mg daily.  Patient started on lactulose.  Monitor blood pressure and if blood pressure drops less than 90 we will need to place on Midodrin.  If continued abdominal distention patient may need a Pleurx tunneled catheter however will need to discuss with IR as patient having multiple paracentesis every few days.  Monitor on Lasix and spironolactone.  Follow.   7.  Diabetes mellitus type 2 Hemoglobin A1c was 7.2 on 09/28/2017.  CBG of 148 this morning.  Continued to hold oral hypoglycemic agents.  Continue sliding scale insulin.  Continue home regimen of Lantus 10 units daily.    8.  Possible pneumonia/HCAP Noted on chest x-ray.  Patient currently afebrile however patient immunocompromised.  Patient was empirically on IV aztreonam and IV vancomycin and subsequently transitioned to IV Levaquin.  Discontinue IV Levaquin after today's dose as patient would have had 7 days of antibiotics.  Continue supportive care.   9.  Severe protein calorie malnutrition Secondary to metastatic small cell lung cancer.  Patient with poor prognosis.   10.  Anemia/thrombocytopenia Likely secondary to malignancy and chemotherapy.  Patient with no overt bleeding.  Hemoglobin currently at 9.1.  Platelet count of 23.  Transfusion threshold hemoglobin less than 7 and platelet count less than 10.  Oncology following.  11.  Extensive stage small cell lung cancer with right upper lobe mass/chronic back pain Patient with some pulmonary nodules and significant mediastinal lymphadenopathy and liver mets diagnosed in June 2019.  Patient status post 5 cycles of chemotherapy and patient noted to be on maintenance treatment with single agent tecentriq first dose  was February 24, 2018.  Patient with complaints of back pain secondary to metastatic disease.  Continue home regimen Vicodin.  Continue Lidoderm patch.  Patient with a poor prognosis felt per oncology and recommending palliative care.  Palliative care following.     DVT prophylaxis: SCDs Code Status: Full Family Communication: Updated patient.  No family at bedside. Disposition Plan: Likely home with home health therapies versus skilled nursing facility with palliative care following.   Consultants:   Oncology: Dr. Lorna Few 02/26/2018  Palliative care consultation Dr. Rowe Pavy 02/27/2018.  Procedures:   Ultrasound-guided paracentesis 02/26/2018----8.4 L removed/03/01/2018-7.8 L of yellow clear fluid removed  CT abdomen and pelvis 02/25/2018  Chest x-ray 02/25/2018  Ultrasound-guided paracentesis pending for 03/04/2018  Antimicrobials:  IV aztreonam 02/25/2018>>>>>> 02/28/2018  IV vancomycin 02/25/2018>>>>> 02/28/2018  IV Levaquin 02/28/2018>>>>>> 03/04/2018   Subjective: Patient in bed.  Denies shortness of breath or chest pain.  Patient does endorse worsening abdominal distention with some discomfort.  Patient denies any bleeding.  No nausea or emesis.    Objective: Vitals:   03/03/18 1527 03/03/18 2047 03/04/18 0419 03/04/18 0422  BP: 115/65 115/73  119/69  Pulse: 96 88  92  Resp: 20 20  14   Temp: 98 F (36.7 C) 97.7 F (36.5 C)  97.8 F (36.6 C)  TempSrc: Oral Oral  Oral  SpO2: 95% 94%  94%  Weight:   125.7 kg   Height:  Intake/Output Summary (Last 24 hours) at 03/04/2018 1123 Last data filed at 03/04/2018 1000 Gross per 24 hour  Intake 830 ml  Output 400 ml  Net 430 ml   Filed Weights   02/28/18 0620 03/01/18 0500 03/04/18 0419  Weight: 124.5 kg 127.8 kg 125.7 kg    Examination:  General exam: NAD Respiratory system: Clear to auscultation bilaterally.  No wheezes, no crackles, no rhonchi. Normal respiratory effort.  Cardiovascular  system:  RRR no murmurs rubs or gallops.  No JVD.  Trace lower extremity edema.  Gastrointestinal system: Abdomen is significantly distended, tight, nontender to palpation, no rebound, no guarding. Central nervous system: Alert and oriented. No focal neurological deficits. Extremities: Symmetric 5 x 5 power. Skin: No rashes, lesions or ulcers Psychiatry: Judgement and insight appear normal. Mood & affect flat.     Data Reviewed: I have personally reviewed following labs and imaging studies  CBC: Recent Labs  Lab 02/27/18 0549 02/28/18 0603 03/01/18 0524 03/02/18 0527 03/03/18 0536 03/04/18 0551  WBC 6.8 6.0 7.3 6.3  --  6.2  NEUTROABS 5.1  --   --   --   --  4.6  HGB 9.3* 9.6* 10.1* 10.0* 9.6* 9.1*  HCT 28.6* 30.3* 31.8* 31.5* 30.1* 28.3*  MCV 109.2* 109.4* 111.2* 109.8*  --  109.3*  PLT 41* 35* 37* 30*  --  23*   Basic Metabolic Panel: Recent Labs  Lab 02/26/18 0812  02/27/18 0549 02/28/18 0603 03/01/18 0524 03/02/18 0527 03/03/18 0536 03/04/18 0551  NA 134*  --  137 138 140 139 139 140  K 5.9*   < > 4.3 3.9 4.0 3.8 4.0 4.0  CL 101  --  104 108 110 110 113* 112*  CO2 22  --  22 22 22  21* 17* 18*  GLUCOSE 158*  --  180* 193* 138* 185* 153* 154*  BUN 52*  --  46* 41* 40* 42* 38* 41*  CREATININE 3.68*  --  3.32* 2.63* 2.15* 2.23* 1.80* 1.78*  CALCIUM 8.6*  --  8.3* 8.0* 8.4* 8.2* 8.4* 9.0  PHOS 4.8*  --  4.1 4.1  --  4.1  --   --    < > = values in this interval not displayed.   GFR: Estimated Creatinine Clearance: 55.2 mL/min (A) (by C-G formula based on SCr of 1.78 mg/dL (H)). Liver Function Tests: Recent Labs  Lab 02/25/18 1708 02/28/18 0603 03/01/18 0524 03/04/18 0551  AST 62*  --  54* 43*  ALT 33  --  32 24  ALKPHOS 115  --  100 91  BILITOT 2.7*  --  4.1* 4.3*  PROT 6.2*  --  6.0* 6.0*  ALBUMIN 2.0* 2.6* 2.5* 3.1*   Recent Labs  Lab 02/25/18 1708  LIPASE 41   Recent Labs  Lab 02/25/18 2258  AMMONIA 55*   Coagulation Profile: Recent Labs    Lab 02/25/18 1708  INR 1.25   Cardiac Enzymes: No results for input(s): CKTOTAL, CKMB, CKMBINDEX, TROPONINI in the last 168 hours. BNP (last 3 results) No results for input(s): PROBNP in the last 8760 hours. HbA1C: No results for input(s): HGBA1C in the last 72 hours. CBG: Recent Labs  Lab 03/03/18 0749 03/03/18 1235 03/03/18 1626 03/03/18 2108 03/04/18 0722  GLUCAP 149* 198* 242* 156* 148*   Lipid Profile: No results for input(s): CHOL, HDL, LDLCALC, TRIG, CHOLHDL, LDLDIRECT in the last 72 hours. Thyroid Function Tests: No results for input(s): TSH, T4TOTAL, FREET4, T3FREE, THYROIDAB in the last  72 hours. Anemia Panel: No results for input(s): VITAMINB12, FOLATE, FERRITIN, TIBC, IRON, RETICCTPCT in the last 72 hours. Sepsis Labs: Recent Labs  Lab 02/25/18 1819  LATICACIDVEN 3.42*    Recent Results (from the past 240 hour(s))  Urine culture     Status: None   Collection Time: 02/25/18  6:34 PM  Result Value Ref Range Status   Specimen Description   Final    URINE, CLEAN CATCH Performed at [facility] [address] [place], [place] [address]    Special Requests   Final    NONE Performed at [facility], [place] [address] [place], [place] [address]    Culture   Final    NO GROWTH Performed at [facility], [place] [address] [place], [place] [address]    Report Status 02/27/2018 FINAL  Final  Blood culture (routine x 2)     Status: None   Collection Time: 02/25/18  8:13 PM  Result Value Ref Range Status   Specimen Description   Final    BLOOD LEFT WRIST Performed at [facility], [place] [address], [place], [place] [address]    Special Requests   Final    BOTTLES DRAWN AEROBIC AND ANAEROBIC Blood Culture adequate volume Performed at [facility] [address], [place], [place] [address]    Culture   Final    NO GROWTH 5 DAYS Performed at [facility], [place] [address]  [place], [place] [address]    Report Status 03/03/2018 FINAL  Final  Blood culture (routine x 2)     Status: None   Collection Time: 02/25/18 10:58 PM  Result Value Ref Range Status   Specimen Description   Final    BLOOD LEFT HAND Performed at [facility], [place] [address] [place], [place] [address]    Special Requests   Final    AEROBIC BOTTLE ONLY Blood Culture results may not be optimal due to an inadequate volume of blood received in culture bottles Performed at [facility] [address] [place], [place] [address]    Culture   Final    NO GROWTH 5 DAYS Performed at [facility], [place] [address], [place], [place] [address]    Report Status 03/03/2018 FINAL  Final  MRSA PCR Screening     Status: Abnormal   Collection Time: 02/26/18  5:08 AM  Result Value Ref Range Status   MRSA by PCR POSITIVE (A) NEGATIVE Final    Comment:        The GeneXpert MRSA Assay (FDA approved for NASAL specimens only), is one component of a comprehensive MRSA colonization surveillance program. It is not intended to diagnose MRSA infection nor to guide or monitor treatment for MRSA infections. RESULT CALLED TO, READ BACK BY AND VERIFIED WITH: CAMPBELL,M RN AT 6834 02/26/18 BY TIBBITTS,K Performed at [facility], [place] [address] [place], [place] [address]   Culture, body fluid-bottle     Status: None (Preliminary result)   Collection Time: 03/01/18  1:45 PM  Result Value Ref Range Status   Specimen Description PERITONEAL  Final   Special Requests NONE  Final   Culture   Final    NO GROWTH 2 DAYS Performed at [facility], [address] [place],  [address]    Report Status PENDING  Incomplete  Gram stain     Status: None   Collection Time:  03/01/18  1:45 PM  Result Value Ref Range Status   Specimen Description PERITONEAL  Final   Special Requests NONE  Final   Gram Stain   Final    FEW WBC PRESENT,BOTH PMN AND MONONUCLEAR NO  ORGANISMS SEEN Performed at [facility], [address] [place], [place] [address]    Report Status 03/01/2018 FINAL  Final         Radiology Studies: No results found.      Scheduled Meds: . furosemide  40 mg Oral Daily  . insulin aspart  0-9 Units Subcutaneous TID WC  . insulin glargine  10 Units Subcutaneous QHS  . lactulose  20 g Oral TID  . lidocaine  1 patch Transdermal Q24H  . omega-3 acid ethyl esters  1 g Oral QHS  . pantoprazole  40 mg Oral Daily  . polyethylene glycol  17 g Oral Daily  . sodium bicarbonate  650 mg Oral BID  . spironolactone  25 mg Oral Daily  . vitamin B-12  100 mcg Oral Daily   Continuous Infusions: . levofloxacin (LEVAQUIN) IV 750 mg (03/03/18 1518)     LOS: 7 days    Time spent: 40 minutes    Irine Seal, MD [facility] Pager [phone]  If 7PM-7AM, please contact night-coverage [email or web address] Password [email or web address] 03/04/2018, 11:23 AM

## 2018-03-04 NOTE — Progress Notes (Signed)
OT Cancellation Note  Patient Details Name: Robert Compton MRN: [id number] DOB: [birth year]   Cancelled Treatment:    Reason Eval/Treat Not Completed: Medical issues which prohibited therapy. Pt is awaiting paracentesis. Will try to check back tomorrow  Manuel Dall 03/04/2018, 3:13 PM  Lesle Chris, OTR/L Acute Rehabilitation Services [phone] [facility] pager [phone] office 03/04/2018

## 2018-03-04 NOTE — Progress Notes (Signed)
Daily Progress Note   Patient Name: Robert Compton       Date: 03/04/2018 DOB: 02-04-1946  Age: 72 y.o. MRN#: [id number] Attending Physician: Eugenie Filler, MD Primary Care Physician: [name], MD Admit Date: 02/25/2018  Reason for Consultation/Follow-up: Establishing goals of care  Subjective: Chart reviewed and discussed with bedside RN.    Patient is lying in bed on entering room.  Attempted to discuss his understanding of his situation and plan moving forward, however he was reluctant to engage other than discussion of his swollen abdomen and plan to get paracentesis today.  He reports that someone had discussed with him about possibility of pleurex placement, but he reports being told that the risk of infection is too high.  Discussed with him the risk vs benefit of pleurex and that this is something that may need to be revisited (certainly would consider if he elects his hospice benefits).  Reports that he is still thinking about this "palatine care stuff" but does not want to discuss further as he is uncomfortable and awaiting paracentesis.  He specifically told me not to call his daughter today, reporting that he doesn't want her to be "too nosy."  See below  Length of Stay: 7  Current Medications: Scheduled Meds:  . furosemide  40 mg Oral Daily  . insulin aspart  0-9 Units Subcutaneous TID WC  . insulin glargine  10 Units Subcutaneous QHS  . lactulose  20 g Oral TID  . lidocaine  1 patch Transdermal Q24H  . lidocaine      . omega-3 acid ethyl esters  1 g Oral QHS  . pantoprazole  40 mg Oral Daily  . polyethylene glycol  17 g Oral Daily  . sodium bicarbonate  650 mg Oral BID  . spironolactone  25 mg Oral Daily  . vitamin B-12  100 mcg Oral Daily     Continuous Infusions:   PRN Meds: acetaminophen **OR** acetaminophen, HYDROmorphone (DILAUDID) injection, ondansetron **OR** ondansetron (ZOFRAN) IV, oxyCODONE  Physical Exam         Regular breathing S 1 S 2 Complaints of back pain Has some edema, has some venous stasis changes both lower extremities Abdomen appears distended Awake alert but flat in affect  Appears weaker  Vital Signs: BP 107/65   Pulse 94   Temp  98.3 F (36.8 C) (Oral)   Resp 16   Ht 6\' 3"  (1.905 m)   Wt 125.7 kg   SpO2 96%   BMI 34.64 kg/m  SpO2: SpO2: 96 % O2 Device: O2 Device: Room Air O2 Flow Rate: O2 Flow Rate (L/min): 0 L/min  Intake/output summary:   Intake/Output Summary (Last 24 hours) at 03/04/2018 1753 Last data filed at 03/04/2018 1739 Gross per 24 hour  Intake 1070 ml  Output 525 ml  Net 545 ml   LBM: Last BM Date: 03/03/18 Baseline Weight: Weight: 118.8 kg Most recent weight: Weight: 125.7 kg       Palliative Assessment/Data:      Patient Active Problem List   Diagnosis Date Noted  . Palliative care by specialist   . Goals of care, counseling/discussion   . Hyperkalemia 02/26/2018  . 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 Ascites   . HCAP (healthcare-associated pneumonia)   . Acute renal failure (ARF) ([place]) 02/25/2018  . Anasarca 01/11/2018  . Chronic diastolic CHF (congestive heart failure) ([place]) 01/11/2018  . Abdominal pain 01/11/2018  . Elevated lactic acid level 01/11/2018  . Malnutrition of moderate degree 12/20/2017  . SBP (spontaneous bacterial peritonitis) ([place])   . Abdominal distention   . Hypervolemia   . Edema 12/13/2017  . Severe protein-calorie malnutrition ([place]) 12/13/2017  . Abnormal liver function 12/13/2017  . Leukocytosis 12/13/2017  . Acute on chronic diastolic (congestive) heart failure ([place]) 11/27/2017  . Tachycardia 11/26/2017  . Anemia 11/26/2017  . Leg edema, right 11/26/2017  . Dehydration   .  Pressure injury of skin 11/09/2017  . Hyperbilirubinemia   . Liver metastasis ([place])   . Neutropenic fever ([place])   . Pancytopenia ([place])   . Febrile neutropenia ([place]) 11/08/2017  . Severe sepsis ([place]) 11/08/2017  . Antineoplastic chemotherapy induced pancytopenia ([place]) 11/08/2017  . AKI (acute kidney injury) ([place]) 11/08/2017  . Encounter for antineoplastic chemotherapy   . Extensive stage primary small cell carcinoma of lung ([place]) 10/03/2017  . Liver masses   . Mass of upper lobe of right lung 09/28/2017  . Hyponatremia 09/27/2017  . Failure to thrive in adult 09/27/2017  . Metastatic cancer to liver ([place]) 09/27/2017  . Diabetes mellitus without complication ([place]) 78/67/6720  . Essential hypertension 09/27/2017    Palliative Care Assessment & Plan   Patient Profile:    Assessment:  72 year old gentleman who lives in [place], [place] with his son. He also has a daughter [name] [phone].    Patient was diagnosed with extensive stage small cell lung cancer in June 2019. He is status post 5 cycles of systemic chemotherapy with partial response however recently was found to have evidence of disease progression.   Patient has been admitted to hospital medicine service with acute renal failure, probable tumor lysis syndrome. He has a recurrent large ascites and has undergone paracenteses and has also been given IV albumin. He is on broad-spectrum antibiotics for possible pneumonia/healthcare associated pneumonia. He also has severe protein calorie malnutrition.  Patient was seen and evaluated by his medical oncologist with final recommendations for the patient to consider palliative care and hospice services.  Palliative medicine team has been following for goals of care discussions. Patient was also seen and evaluated by radiation oncology, appreciate their input and recommendations.  Recommendations/Plan:  Ongoing discussions with patient (and daughter if he allows)  regarding code status and discharge planning (home with hospice versus SNF rehab with  palliative care). Prior, his daughter stated that the patient does not like the experience of being inside a facility.  Recommend consideration for peritoneal drain catheter placement, due to recurrent ascites requiring paracentesis.   Patient declined to discuss further today.  Is agreeable to follow-up tomorrow.  Asked I not call his daughter today.   Code Status:    Code Status Orders  (From admission, onward)         Start     Ordered   02/25/18 2025  Full code  Continuous     02/25/18 2028        Code Status History    Date Active Date Inactive Code Status Order ID Comments User Context   01/11/2018 0120 01/18/2018 1652 Full Code [id number]  Ivor Costa, MD ED   12/14/2017 0005 12/24/2017 2208 Full Code [id number]  [name], MD ED   11/26/2017 2055 12/02/2017 1558 Full Code [id number]  [name], MD Inpatient   11/08/2017 2005 11/12/2017 1833 Full Code [id number]  [name], DO ED   09/27/2017 2346 10/08/2017 2258 Full Code [id number]  [name], MD Inpatient       Prognosis:   < 3 months  Discharge Planning:  [facility] for rehab with Palliative care service follow-up vs home hospice?   Care plan was discussed with Patient, bedside RN  Thank you for allowing the Palliative Medicine Team to assist in the care of this patient.   Total Time 30 Prolonged Time Billed  no       Greater than 50%  of this time was spent counseling and coordinating care related to the above assessment and plan.  Micheline Rough, MD [facility] Team [phone]  Please contact Palliative Medicine Team phone at [phone] for questions and concerns.

## 2018-03-04 NOTE — Procedures (Signed)
Ultrasound-guided  therapeutic paracentesis performed yielding 7.8  liters of yellow  fluid. No immediate complications.

## 2018-03-05 LAB — COMPREHENSIVE METABOLIC PANEL
ALBUMIN: 2.8 g/dL — AB (ref 3.5–5.0)
ALT: 27 U/L (ref 0–44)
AST: 42 U/L — AB (ref 15–41)
Alkaline Phosphatase: 105 U/L (ref 38–126)
Anion gap: 9 (ref 5–15)
BUN: 44 mg/dL — AB (ref 8–23)
CHLORIDE: 111 mmol/L (ref 98–111)
CO2: 19 mmol/L — ABNORMAL LOW (ref 22–32)
Calcium: 8.9 mg/dL (ref 8.9–10.3)
Creatinine, Ser: 1.83 mg/dL — ABNORMAL HIGH (ref 0.61–1.24)
GFR calc Af Amer: 41 mL/min — ABNORMAL LOW (ref >60)
GFR calc non Af Amer: 36 mL/min — ABNORMAL LOW (ref >60)
GLUCOSE: 198 mg/dL — AB (ref 70–99)
POTASSIUM: 4.4 mmol/L (ref 3.5–5.1)
Sodium: 139 mmol/L (ref 135–145)
Total Bilirubin: 5 mg/dL — ABNORMAL HIGH (ref 0.3–1.2)
Total Protein: 5.8 g/dL — ABNORMAL LOW (ref 6.5–8.1)

## 2018-03-05 LAB — CBC
HCT: 29.4 % — ABNORMAL LOW (ref 39.0–52.0)
Hemoglobin: 9.6 g/dL — ABNORMAL LOW (ref 13.0–17.0)
MCH: 34.8 pg — ABNORMAL HIGH (ref 26.0–34.0)
MCHC: 32.7 g/dL (ref 30.0–36.0)
MCV: 106.5 fL — ABNORMAL HIGH (ref 80.0–100.0)
NRBC: 0 % (ref 0.0–0.2)
PLATELETS: 20 10*3/uL — AB (ref 150–400)
RBC: 2.76 MIL/uL — ABNORMAL LOW (ref 4.22–5.81)
RDW: 19.3 % — ABNORMAL HIGH (ref 11.5–15.5)
WBC: 7.5 10*3/uL (ref 4.0–10.5)

## 2018-03-05 LAB — GLUCOSE, CAPILLARY
GLUCOSE-CAPILLARY: 164 mg/dL — AB (ref 70–99)
GLUCOSE-CAPILLARY: 166 mg/dL — AB (ref 70–99)
Glucose-Capillary: 192 mg/dL — ABNORMAL HIGH (ref 70–99)
Glucose-Capillary: 155 mg/dL — ABNORMAL HIGH (ref 70–99)

## 2018-03-05 NOTE — Progress Notes (Signed)
CHART NOTE I called [name], Mr. Buhrman's daughter to update her about her father's condition and the reason why he is not a good candidate for any future treatment.  Unfortunately the patient is declining rapidly and his disease progressed to the point that he will not be great candidate for any systemic treatment including immunotherapy.  He has been requiring ultrasound-guided paracentesis almost every other day with drainage of 7-8 L of fluid every time.  I strongly recommended for them to consider palliative care and hospice after discharge home or even discharged to a hospice facility for end-of-life care.  The daughter was very appreciative of the care and time she had with her father over the last few months. She was advised to call if she has any question or if we can help her with any other issues.

## 2018-03-05 NOTE — Progress Notes (Signed)
PROGRESS NOTE    Robert Compton  OZD:[id number] DOB: 20-Dec-1945 DOA: 02/25/2018 PCP: [name], MD   Brief Narrative: Patient is a 72 year old male history of extensive stage small cell lung cancer started on immunotherapy, history of hypertension, diabetes type 2 presented to the ED with increasing weakness and increasing abdominal distention.  Patient noted to be in acute renal failure, hyperkalemic with a potassium of 6, chest x-ray showing possible infiltrates.  Patient was also treated for pneumonia.  Patient admitted with acute renal failure, hyperkalemia and worsening ascites. He has been requiring intermittent paracentesis. Evaluated by oncology, [name] who is  Recommending hospice. Awaiting palliative follow-up.    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 Creatinine on admission was at 4, from prior at 1.2. Renal ultrasound negative for hydronephrosis. Monitor renal function on Lasix and spironolactone. She was a cerebral diuretics due  to ascites.   2-Hyperkalemia; thought to be related to renal failure versus tumor lysis syndrome.  He received a dose of Kayexalate.  Resolved.    Probably tumor lysis syndrome; Patient received IV Rasburicase.  Uric acid has decreased to 2.5 from 6.  Case was discussed with oncologist.  Hypertension; coronary on  hold due to soft systolic blood pressure.   Chronic diastolic heart failure;  Continue with oral Lasix.   Ascites, secondary to cirrhosis of the liver; He has require multiple paracentesis. Last paracentesis performed 03/04/2018.  If continued abdominal distention patient may need a Pleurx tunneled catheter however will need to discuss with IR   Diabetes; continue with Lantus, sliding scale insulin  Possible pneumonia; patient received 7 days course of antibiotics.  Antibiotics discontinued 03/04/2018.  Severe protein caloric malnutrition; Secondary to metastatic small cell lung cancer. Continue with supplements.  Anemia thrombocytopenia: Likely secondary to malignancy and chemotherapy. Continue to follow trend  Extensive stage small cell lung cancer with right upper lobe mass chronic back pain; Patient with very poor prognosis. Candidate for fetal chemotherapy. He was evaluated by [name] who is recommending hospice care. Away for palliative evaluation.  Stage II Buttocks both sides. Present on admission. Local care. Pressure injury.   RN Pressure Injury Documentation: Pressure Injury 11/08/17 Stage II -  Partial thickness loss of dermis presenting as a shallow open ulcer with a red, pink wound bed without slough. two small .5 cm healing stage 2 on each side inner buttock (Active)  11/08/17 2200   Location: Buttocks  Location Orientation: Mid  Staging: Stage II -  Partial thickness loss of dermis presenting as a shallow open ulcer with a red, pink wound bed without slough.  Wound Description (Comments): two small .5 cm healing stage 2 on each side inner buttock  Present on Admission: Yes    Malnutrition Type:      Malnutrition Characteristics:      Nutrition Interventions:     Estimated body mass index is 33.12 kg/m as calculated from the  following:   Height as of this encounter: 6\' 3"  (1.905 m).   Weight as of this encounter: 120.2 kg.   DVT  prophylaxis: ( SCDs Code Status: code Family Communication: no family at bedside Disposition Plan: to be determined, goals of care discussion will determine disposition   Consultants:   Oncology: Dr. Lorna Few 02/26/2018  Palliative care consultation Dr. Rowe Pavy 02/27/2018.   Procedures:  Ultrasound-guided paracentesis 02/26/2018----8.4 L removed/03/01/2018-7.8 L of yellow clear fluid removed  CT abdomen and pelvis 02/25/2018  Chest x-ray 02/25/2018  Ultrasound-guided paracentesis pending for 03/04/2018   Antimicrobials:   IV aztreonam 02/25/2018>>>>>> 02/28/2018  IV vancomycin 02/25/2018>>>>> 02/28/2018  IV Levaquin 02/28/2018>>>>>> 03/04/2018  Subjective: Sleepy, wake up answer questions.  Relates abdominal pain. Abdomen less distended. He is in denial, he said things will get better.   Objective: Vitals:   03/04/18 2112 03/05/18 0459 03/05/18 0505 03/05/18 1425  BP: 106/63  103/65 97/60  Pulse: 92  (!) 101 92  Resp: 18  14 16   Temp: 98.2 F (36.8 C)  (!) 97.4 F (36.3 C) 97.6 F (36.4 C)  TempSrc: Oral  Oral Axillary  SpO2: 96%  94% 98%  Weight:  120.2 kg    Height:        Intake/Output Summary (Last 24 hours) at 03/05/2018 1821 Last data filed at 03/05/2018 1142 Gross per 24 hour  Intake 800 ml  Output 225 ml  Net 575 ml   Filed Weights   03/01/18 0500 03/04/18 0419 03/05/18 0459  Weight: 127.8 kg 125.7 kg 120.2 kg    Examination:  General exam: sleepy, chronic ill appearing. Icteric.  Respiratory system: Clear to auscultation. Respiratory effort normal. Cardiovascular system: S1 & S2 heard, RRR. Gastrointestinal system: Abdomen is distended, mild tenderness.  Central nervous system: sleepy  Extremities: Symmetric 5 x 5 power.     Data Reviewed: I have personally reviewed following labs and imaging studies  CBC: Recent Labs  Lab 02/27/18 0549 02/28/18 0603 03/01/18 0524 03/02/18 0527 03/03/18 0536 03/04/18 0551  03/05/18 1226  WBC 6.8 6.0 7.3 6.3  --  6.2 7.5  NEUTROABS 5.1  --   --   --   --  4.6  --   HGB 9.3* 9.6* 10.1* 10.0* 9.6* 9.1* 9.6*  HCT 28.6* 30.3* 31.8* 31.5* 30.1* 28.3* 29.4*  MCV 109.2* 109.4* 111.2* 109.8*  --  109.3* 106.5*  PLT 41* 35* 37* 30*  --  23* 20*   Basic Metabolic Panel: Recent Labs  Lab 02/27/18 0549 02/28/18 0603 03/01/18 0524 03/02/18 0527 03/03/18 0536 03/04/18 0551 03/05/18 1226  NA 137 138 140 139 139 140 139  K 4.3 3.9 4.0 3.8 4.0 4.0 4.4  CL 104 108 110 110 113* 112* 111  CO2 22 22 22  21* 17* 18* 19*  GLUCOSE 180* 193* 138* 185* 153* 154* 198*  BUN 46* 41* 40* 42* 38* 41* 44*  CREATININE 3.32* 2.63* 2.15* 2.23* 1.80* 1.78* 1.83*  CALCIUM 8.3* 8.0* 8.4* 8.2* 8.4* 9.0 8.9  PHOS 4.1 4.1  --  4.1  --   --   --    GFR: Estimated Creatinine Clearance: 52.5 mL/min (A) (by C-G formula based on SCr of 1.83 mg/dL (H)). Liver Function Tests: Recent Labs  Lab 02/28/18 0603 03/01/18 0524 03/04/18 0551 03/05/18 1226  AST  --  54* 43* 42*  ALT  --  32 24 27  ALKPHOS  --  100 91 105  BILITOT  --  4.1* 4.3* 5.0*  PROT  --  6.0* 6.0* 5.8*  ALBUMIN 2.6* 2.5* 3.1* 2.8*   No results for input(s): LIPASE, AMYLASE in the last 168 hours. No results for input(s): AMMONIA in the last 168 hours. Coagulation Profile: No results for input(s): INR, PROTIME in the last 168 hours. Cardiac Enzymes: No results for input(s): CKTOTAL, CKMB, CKMBINDEX, TROPONINI in the last 168 hours. BNP (last 3 results) No results for input(s): PROBNP in the last 8760 hours. HbA1C: No results for input(s): HGBA1C in the last 72 hours. CBG: Recent Labs  Lab 03/04/18 1741 03/04/18 2110 03/05/18 0758 03/05/18 1140 03/05/18 1654  GLUCAP 159* 165* 164* 192* 155*   Lipid Profile: No results for input(s): CHOL, HDL, LDLCALC, TRIG, CHOLHDL, LDLDIRECT in the last 72 hours. Thyroid Function Tests: No results for input(s): TSH, T4TOTAL, FREET4, T3FREE, THYROIDAB in the last 72  hours. Anemia Panel: No results for input(s): VITAMINB12, FOLATE, FERRITIN, TIBC, IRON, RETICCTPCT in the last 72 hours. Sepsis Labs: No results for input(s): PROCALCITON, LATICACIDVEN in the last 168 hours.  Recent Results (from the past 240 hour(s))  Urine culture     Status: None   Collection Time: 02/25/18  6:34 PM  Result Value Ref Range Status   Specimen Description   Final    URINE, CLEAN CATCH Performed at [facility], [place] [address] [place], [place] [address]    Special Requests   Final    NONE Performed at [facility], [place] [address] [place], [place] [address]    Culture   Final    NO GROWTH Performed at [facility], [place] [address] [place], [place] [address]    Report Status 02/27/2018 FINAL  Final  Blood culture (routine x 2)     Status: None   Collection Time: 02/25/18  8:13 PM  Result Value Ref Range Status   Specimen Description   Final    BLOOD LEFT WRIST Performed at [facility], [place] [address], [place], [place] [address]    Special Requests   Final    BOTTLES DRAWN AEROBIC AND ANAEROBIC Blood Culture adequate volume Performed at [place] [address] [place], [place] [address]    Culture   Final    NO GROWTH 5 DAYS Performed at [facility], [place] [address], [place], [place] [address]    Report Status 03/03/2018 FINAL  Final  Blood culture (routine x 2)     Status: None   Collection Time: 02/25/18 10:58 PM  Result Value Ref Range Status   Specimen Description   Final    BLOOD LEFT HAND Performed at [facility], [place] [address] [place], [place] [address]    Special Requests   Final    AEROBIC BOTTLE ONLY Blood Culture results may not be optimal due to an inadequate volume of blood received in culture bottles Performed at [place] [address], [place], [place] [address]    Culture   Final    NO GROWTH 5 DAYS Performed at [facility], [place] [address] [place], [place] [address]    Report Status 03/03/2018 FINAL  Final  MRSA PCR Screening     Status: Abnormal   Collection Time: 02/26/18  5:08 AM  Result Value Ref Range Status   MRSA by PCR POSITIVE (A) NEGATIVE Final    Comment:        The GeneXpert MRSA Assay (FDA approved for NASAL specimens only), is one  component of a comprehensive MRSA colonization surveillance program. It is not intended to diagnose MRSA infection nor to guide or monitor treatment for MRSA infections. RESULT CALLED TO, READ BACK BY AND VERIFIED WITH: CAMPBELL,M RN AT 8937 02/26/18 BY TIBBITTS,K Performed at [facility], [place] [address] [place], [place] [address]   Culture, body fluid-bottle     Status: None (Preliminary result)   Collection Time: 03/01/18  1:45 PM  Result Value Ref Range Status   Specimen Description PERITONEAL  Final   Special Requests NONE  Final   Culture   Final    NO GROWTH 4 DAYS Performed at [facility], [address] [place], [place] [address]    Report Status PENDING  Incomplete  Gram stain     Status: None   Collection Time: 03/01/18  1:45 PM  Result Value Ref Range Status   Specimen Description PERITONEAL  Final   Special Requests NONE  Final   Gram Stain   Final    FEW WBC PRESENT,BOTH PMN AND MONONUCLEAR NO ORGANISMS SEEN Performed at [facility], [address], [place],  [address]    Report Status 03/01/2018 FINAL  Final         Radiology Studies: US Paracentesis  Result Date: 03/04/2018 INDICATION: Patient with history of small cell lung cancer, recurrent ascites. Request made for therapeutic paracentesis. EXAM: ULTRASOUND GUIDED THERAPEUTIC PARACENTESIS MEDICATIONS: None COMPLICATIONS: None immediate. PROCEDURE: Informed written consent was obtained from the patient after a discussion of the risks, benefits and alternatives to treatment. A timeout was performed prior to the initiation  of the procedure. Initial ultrasound scanning demonstrates a large amount of ascites within the left lower abdominal quadrant. The left lower abdomen was prepped and draped in the usual sterile fashion. 1% lidocaine was used for local anesthesia. Following this, a 19 gauge, 7-cm, Yueh catheter was introduced. An ultrasound image was saved for documentation purposes. The paracentesis was performed. The catheter was removed and a dressing was applied. The patient tolerated the procedure well without immediate post procedural complication. FINDINGS: A total of approximately 7.8 liters of yellow fluid was removed. IMPRESSION: Successful ultrasound-guided therapeutic paracentesis yielding 7.8 liters of peritoneal fluid. Read by: Rowe Sara, PA-C Electronically Signed   By: Lucrezia Europe M.D.   On: 03/04/2018 17:16        Scheduled Meds: . furosemide  40 mg Oral Daily  . insulin aspart  0-9 Units Subcutaneous TID WC  . insulin glargine  10 Units Subcutaneous QHS  . lactulose  20 g Oral TID  . lidocaine  1 patch Transdermal Q24H  . omega-3 acid ethyl esters  1 g Oral QHS  . pantoprazole  40 mg Oral Daily  . polyethylene glycol  17 g Oral Daily  . spironolactone  25 mg Oral Daily  . vitamin B-12  100 mcg Oral Daily   Continuous Infusions:   LOS: 8 days    Time spent: 35 minutes    Elmarie Shiley, MD [facility] Pager [phone]  If 7PM-7AM, please contact night-coverage [email or web address] Password [facility] 03/05/2018, 6:21 PM

## 2018-03-05 NOTE — Progress Notes (Signed)
Physical Therapy Treatment Patient Details Name: Robert Compton MRN: [id number] DOB: 1945/06/19 Today's Date: 03/05/2018    History of Present Illness 72 yo male admitted 11/12 for weakness, back pain, small cell lung cancer with mets to liver. Pt with multiple admissions over the last 6 months for cancer-related issues. PMH includes CHF, malnutrition, edema, tachycardia, neutropenia, paracentesis 5 days ago with 11L fluid removed and 11/13 with 8L removed, HTN, OA, RTKR, former smoker, ARF, DMII.     PT Comments    Pt pleasant and really wanted to walk with a 4WW today however unable.  General bed mobility comments: required increased assist and present with Mod L lean while sitting EOB.  Pt was unable to support self > 3 min.General transfer comment: pt required + 2 side by side assist with brief moment to stand partially erect.  Once upright, pt was unable to weight shift and unable to pivot properly to recliner.  required + 2 Total Assist to push hips in dierction of recliner.  Once in recliner pt stated he needed to have a BM.  So used [employer] to transfer out of recliner to [place] [facility].  No BM.  Used Max[employer] Move to get to recliner from [facility].  Positioned to comfort.  Very weak.  Pt exhausted by this point.   Rec nursing use a Max Lift to get pt back to bed.    Follow Up Recommendations  SNF     Equipment Recommendations  None recommended by PT    Recommendations for Other Services       Precautions / Restrictions Precautions Precautions: Fall Precaution Comments: monitor sats Restrictions Weight Bearing Restrictions: No    Mobility  Bed Mobility Overal bed mobility: Needs Assistance Bed Mobility: Supine to Sit     Supine to sit: Total assist;+2 for physical assistance;+2 for safety/equipment(pt 10%)     General bed mobility comments: required increased assist and present with Mod L lean while sitting EOB.  Pt was unable to support self > 3 min.  Transfers Overall transfer level:  Needs assistance Equipment used: Rolling walker (2 wheeled) Transfers: Sit to/from [facility] Sit to Stand: From elevated surface;Mod assist;Max assist Stand pivot transfers: Max assist;Total assist       General transfer comment: pt required + 2 side by side assist with brief moment to stand partially erect.  Once upright, pt was unable to weight shift and unable to pivot properly to recliner.  required + 2 Total Assist to push hips in dierction of recliner.   Rec nursing use a Max Lift to get pt back to bed.  Ambulation/Gait             General Gait Details: unable to weight shift and unable to off load to safely attempt gait at this time.  Very weak.     Stairs             Wheelchair Mobility    Modified Rankin (Stroke Patients Only)       Balance                                            Cognition Arousal/Alertness: Awake/alert Behavior During Therapy: WFL for tasks assessed/performed Overall Cognitive Status: Within Functional Limits for tasks assessed  General Comments: sleepy, fatigues easily      Exercises      General Comments        Pertinent Vitals/Pain Pain Assessment: No/denies pain    Home Living                      Prior Function            PT Goals (current goals can now be found in the care plan section) Progress towards PT goals: Progressing toward goals    Frequency    Min 2X/week      PT Plan Current plan remains appropriate    Co-evaluation              AM-PAC PT "6 Clicks" Daily Activity  Outcome Measure  Difficulty turning over in bed (including adjusting bedclothes, sheets and blankets)?: Unable Difficulty moving from lying on back to sitting on the side of the bed? : Unable Difficulty sitting down on and standing up from a chair with arms (e.g., wheelchair, bedside commode, etc,.)?: Unable Help needed moving to  and from a bed to chair (including a wheelchair)?: Total Help needed walking in hospital room?: Total Help needed climbing 3-5 steps with a railing? : Total 6 Click Score: 6    End of Session Equipment Utilized During Treatment: Gait belt Activity Tolerance: Other (comment)(weak) Patient left: in chair;with call bell/phone within reach Nurse Communication: Mobility status;Need for lift equipment PT Visit Diagnosis: Unsteadiness on feet (R26.81);Other abnormalities of gait and mobility (R26.89)     Time: 1340-1420 PT Time Calculation (min) (ACUTE ONLY): 40 min  Charges:  $Gait Training: 8-22 mins $Therapeutic Activity: 23-37 mins                     Rica Koyanagi  PTA Acute  Rehabilitation Services Pager      [phone] Office      [phone]

## 2018-03-06 ENCOUNTER — Other Ambulatory Visit: Payer: BLUE CROSS/BLUE SHIELD

## 2018-03-06 DIAGNOSIS — J189 Pneumonia, unspecified organism: Secondary | ICD-10-CM

## 2018-03-06 LAB — CBC
HCT: 29.4 % — ABNORMAL LOW (ref 39.0–52.0)
Hemoglobin: 9.7 g/dL — ABNORMAL LOW (ref 13.0–17.0)
MCH: 35.4 pg — AB (ref 26.0–34.0)
MCHC: 33 g/dL (ref 30.0–36.0)
MCV: 107.3 fL — AB (ref 80.0–100.0)
PLATELETS: 20 10*3/uL — AB (ref 150–400)
RBC: 2.74 MIL/uL — ABNORMAL LOW (ref 4.22–5.81)
RDW: 19.5 % — AB (ref 11.5–15.5)
WBC: 8.1 10*3/uL (ref 4.0–10.5)
nRBC: 0 % (ref 0.0–0.2)

## 2018-03-06 LAB — BASIC METABOLIC PANEL
Anion gap: 10 (ref 5–15)
BUN: 48 mg/dL — AB (ref 8–23)
CALCIUM: 9.1 mg/dL (ref 8.9–10.3)
CO2: 19 mmol/L — ABNORMAL LOW (ref 22–32)
CREATININE: 1.76 mg/dL — AB (ref 0.61–1.24)
Chloride: 108 mmol/L (ref 98–111)
GFR calc Af Amer: 43 mL/min — ABNORMAL LOW (ref >60)
GFR, EST NON AFRICAN AMERICAN: 37 mL/min — AB (ref >60)
GLUCOSE: 167 mg/dL — AB (ref 70–99)
POTASSIUM: 4.7 mmol/L (ref 3.5–5.1)
Sodium: 137 mmol/L (ref 135–145)

## 2018-03-06 LAB — CULTURE, BODY FLUID W GRAM STAIN -BOTTLE: Culture: NO GROWTH

## 2018-03-06 LAB — GLUCOSE, CAPILLARY
GLUCOSE-CAPILLARY: 152 mg/dL — AB (ref 70–99)
Glucose-Capillary: 156 mg/dL — ABNORMAL HIGH (ref 70–99)
Glucose-Capillary: 154 mg/dL — ABNORMAL HIGH (ref 70–99)
Glucose-Capillary: 157 mg/dL — ABNORMAL HIGH (ref 70–99)

## 2018-03-06 MED ORDER — SODIUM BICARBONATE 650 MG PO TABS
650.0000 mg | ORAL_TABLET | Freq: Two times a day (BID) | ORAL | Status: DC
  Administered 2018-03-06 – 2018-03-09 (×8): 650 mg via ORAL
  Filled 2018-03-06 (×9): qty 1

## 2018-03-06 NOTE — Progress Notes (Signed)
Daily Progress Note   Patient Name: Robert Compton       Date: 03/06/2018 DOB: Jul 16, 1945  Age: 72 y.o. MRN#: [id number] Attending Physician: Robert Shiley, MD Primary Care Physician: Robert Solian, MD Admit Date: 02/25/2018  Reason for Consultation/Follow-up: Establishing goals of care  Subjective: Chart reviewed and discussed with bedside RN.    Patient is lying in bed on entering room.  Family is present in the room, including daughter, son in law, sister, and his son's girlfriend.  He repots "we already talked about this yesterday" when I asked his understanding of situation and options moving forward.  I reviewed his prior stated wish to be comfortable and be at home with him and his family.  We discussed options on how to facilitate this including involvement of hospice in his care moving forward.  He repots being agreeable to this today.  We also discussed recommendation for consideration for pleurex catheter.  He would like to discuss this with IR prior to making decision.  He then reported that he needed to use bathroom.  I then met with family outside of his room.  Family is still emotionally struggling with change from disease modifying therapy to focus on supportive care with assistance of hospice.  I provided supportive listening as they discussed this.  His family reports that they are supportive of any decision he makes and will work to develop a care plan to allow him to be home with hospice, however, they are appropriately concerned about being able to adequately provide his needs.  Family is familiar with hospice support and feel that they will be a significant resource for them  I followed up with patient again later, and he was visiting with other friends that had  arrived.  He declined to discuss further today.  Length of Stay: 9  Current Medications: Scheduled Meds:  . furosemide  40 mg Oral Daily  . insulin aspart  0-9 Units Subcutaneous TID WC  . insulin glargine  10 Units Subcutaneous QHS  . lactulose  20 g Oral TID  . lidocaine  1 patch Transdermal Q24H  . omega-3 acid ethyl esters  1 g Oral QHS  . pantoprazole  40 mg Oral Daily  . polyethylene glycol  17 g Oral Daily  . sodium bicarbonate  650 mg Oral BID  .  spironolactone  25 mg Oral Daily  . vitamin B-12  100 mcg Oral Daily    Continuous Infusions:   PRN Meds: acetaminophen **OR** acetaminophen, HYDROmorphone (DILAUDID) injection, ondansetron **OR** ondansetron (ZOFRAN) IV, oxyCODONE  Physical Exam         Regular breathing S 1 S 2 Complaints of back pain Has some edema, has some venous stasis changes both lower extremities Abdomen appears distended Awake alert but flat in affect  Appears weaker  Vital Signs: BP 110/69 (BP Location: Left Arm)   Pulse 83   Temp 97.9 F (36.6 C) (Oral)   Resp 18   Ht 6' 3"  (1.905 m)   Wt 121.1 kg   SpO2 99%   BMI 33.37 kg/m  SpO2: SpO2: 99 % O2 Device: O2 Device: Room Air O2 Flow Rate: O2 Flow Rate (L/min): 0 L/min  Intake/output summary:   Intake/Output Summary (Last 24 hours) at 03/06/2018 2243 Last data filed at 03/06/2018 1700 Gross per 24 hour  Intake 480 ml  Output 700 ml  Net -220 ml   LBM: Last BM Date: 03/05/18 Baseline Weight: Weight: 118.8 kg Most recent weight: Weight: 121.1 kg       Palliative Assessment/Data:      Patient Active Problem List   Diagnosis Date Noted  . Palliative care by specialist   . Goals of care, counseling/discussion   . Hyperkalemia 02/26/2018  . 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 Ascites   . HCAP (healthcare-associated pneumonia)   . Acute renal failure (ARF) ([place]) 02/25/2018  . Anasarca 01/11/2018  . Chronic diastolic CHF (congestive  heart failure) ([place]) 01/11/2018  . Abdominal pain 01/11/2018  . Elevated lactic acid level 01/11/2018  . Malnutrition of moderate degree 12/20/2017  . SBP (spontaneous bacterial peritonitis) ([place])   . Abdominal distention   . Hypervolemia   . Edema 12/13/2017  . Severe protein-calorie malnutrition ([place]) 12/13/2017  . Abnormal liver function 12/13/2017  . Leukocytosis 12/13/2017  . Acute on chronic diastolic (congestive) heart failure ([place]) 11/27/2017  . Tachycardia 11/26/2017  . Anemia 11/26/2017  . Leg edema, right 11/26/2017  . Dehydration   . Pressure injury of skin 11/09/2017  . Hyperbilirubinemia   . Liver metastasis ([place])   . Neutropenic fever ([place])   . Pancytopenia ([place])   . Febrile neutropenia ([place]) 11/08/2017  . Severe sepsis ([place]) 11/08/2017  . Antineoplastic chemotherapy induced pancytopenia ([place]) 11/08/2017  . AKI (acute kidney injury) ([place]) 11/08/2017  . Encounter for antineoplastic chemotherapy   . Extensive stage primary small cell carcinoma of lung ([place]) 10/03/2017  . Liver masses   . Mass of upper lobe of right lung 09/28/2017  . Hyponatremia 09/27/2017  . Failure to thrive in adult 09/27/2017  . Metastatic cancer to liver ([place]) 09/27/2017  . Diabetes mellitus without complication ([place]) 63/89/3734  . Essential hypertension 09/27/2017    Palliative Care Assessment & Plan   Patient Profile:    Assessment:  72 year old gentleman who lives in [place], [place] with his son. He also has a daughter Robert Compton [phone].    Patient was diagnosed with extensive stage small cell lung cancer in June 2019. He is status post 5 cycles of systemic chemotherapy with partial response however recently was found to have evidence of disease progression.   Patient has been admitted to hospital medicine service with acute renal failure, probable tumor lysis syndrome. He has a recurrent large ascites and has undergone paracenteses and has also  been given IV albumin. He  is on broad-spectrum antibiotics for possible pneumonia/healthcare associated pneumonia. He also has severe protein calorie malnutrition.  Patient was seen and evaluated by his medical oncologist with final recommendations for the patient to consider palliative care and hospice services.  Palliative medicine team has been following for goals of care discussions. Patient was also seen and evaluated by radiation oncology, appreciate their input and recommendations.  Recommendations/Plan:  - Goal is to transition home with support of hospice.  Will place referral to CM. - I believe that he would benefit from placement of pleurex prior to discharge to allow hospice to drain ascites at home.  He reports needing to discuss with IR prior to making decision on this.  Will place referral for IR evaluation/recommendations. - Will continue to address code status.  Did not push issue today and will continue to engage as he is emotionally able.  Remaining FULL Code does not preclude him from hospice enrollment.  Code Status:    Code Status Orders  (From admission, onward)         Start     Ordered   02/25/18 2025  Full code  Continuous     02/25/18 2028        Code Status History    Date Active Date Inactive Code Status Order ID Comments User Context   01/11/2018 0120 01/18/2018 1652 Full Code [id number]  Ivor Costa, MD ED   12/14/2017 0005 12/24/2017 2208 Full Code [id number]  [name], MD ED   11/26/2017 2055 12/02/2017 1558 Full Code [id number]  [name], MD Inpatient   11/08/2017 2005 11/12/2017 1833 Full Code [id number]  [name], DO ED   09/27/2017 2346 10/08/2017 2258 Full Code [id number]  [name], MD Inpatient       Prognosis:   < 3 months  Discharge Planning:  Plan for home hospice   Care plan was discussed with Patient, bedside RN, family  Thank you for allowing the Palliative Medicine Team to assist in the care of this patient.  Time in: 1620 Time out:  1745 Total Time 85 Prolonged Time Billed  yes      Greater than 50%  of this time was spent counseling and coordinating care related to the above assessment and plan.  Micheline Rough, MD [facility] Team [phone]  Please contact Palliative Medicine Team phone at [phone] for questions and concerns.

## 2018-03-06 NOTE — Progress Notes (Signed)
Daily Progress Note   Patient Name: Robert Compton       Date: 03/06/2018 DOB: 1945-04-23  Age: 72 y.o. MRN#: [id number] Attending Physician: Robert Shiley, MD Primary Care Physician: Robert Solian, MD Admit Date: 02/25/2018  Reason for Consultation/Follow-up: Establishing goals of care  Subjective: Chart reviewed and discussed with bedside RN.    Patient is lying in bed on entering room.  He reports being tired and is not fully open to conversation.  We discussed options moving forward and he repots that his goal is to be at home and this is the only discharge that he is willing to consider.  I made strong recommendation that hospice be part of care plan if he transitions home, and he stated "whatever."    I called and was able to reach his daughter.  She reports that his family understands his situation and is willing to do whatever is necessary to support him, but he often gets "irriatated" at family and feels people are trying to tell him what to do.  She is coming to visit tomorrow in the afternoon and I let her know that I will be available at that time to meet with family to discuss further.  See below  Length of Stay: 9  Current Medications: Scheduled Meds:  . furosemide  40 mg Oral Daily  . insulin aspart  0-9 Units Subcutaneous TID WC  . insulin glargine  10 Units Subcutaneous QHS  . lactulose  20 g Oral TID  . lidocaine  1 patch Transdermal Q24H  . omega-3 acid ethyl esters  1 g Oral QHS  . pantoprazole  40 mg Oral Daily  . polyethylene glycol  17 g Oral Daily  . spironolactone  25 mg Oral Daily  . vitamin B-12  100 mcg Oral Daily    Continuous Infusions:   PRN Meds: acetaminophen **OR** acetaminophen, HYDROmorphone (DILAUDID) injection, ondansetron **OR**  ondansetron (ZOFRAN) IV, oxyCODONE  Physical Exam         Regular breathing S 1 S 2 Complaints of back pain Has some edema, has some venous stasis changes both lower extremities Abdomen appears distended Awake alert but flat in affect  Appears weaker  Vital Signs: BP (!) 104/56 (BP Location: Right Arm)   Pulse 90   Temp 98.2 F (36.8 C) (Oral)  Resp 16   Ht 6\' 3"  (1.905 m)   Wt 121.1 kg   SpO2 96%   BMI 33.37 kg/m  SpO2: SpO2: 96 % O2 Device: O2 Device: Room Air O2 Flow Rate: O2 Flow Rate (L/min): 0 L/min  Intake/output summary:   Intake/Output Summary (Last 24 hours) at 03/06/2018 0857 Last data filed at 03/05/2018 1900 Gross per 24 hour  Intake 560 ml  Output 125 ml  Net 435 ml   LBM: Last BM Date: 03/05/18 Baseline Weight: Weight: 118.8 kg Most recent weight: Weight: 121.1 kg       Palliative Assessment/Data:      Patient Active Problem List   Diagnosis Date Noted  . Palliative care by specialist   . Goals of care, counseling/discussion   . Hyperkalemia 02/26/2018  . 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 Ascites   . HCAP (healthcare-associated pneumonia)   . Acute renal failure (ARF) ([place]) 02/25/2018  . Anasarca 01/11/2018  . Chronic diastolic CHF (congestive heart failure) ([place]) 01/11/2018  . Abdominal pain 01/11/2018  . Elevated lactic acid level 01/11/2018  . Malnutrition of moderate degree 12/20/2017  . SBP (spontaneous bacterial peritonitis) ([place])   . Abdominal distention   . Hypervolemia   . Edema 12/13/2017  . Severe protein-calorie malnutrition ([place]) 12/13/2017  . Abnormal liver function 12/13/2017  . Leukocytosis 12/13/2017  . Acute on chronic diastolic (congestive) heart failure ([place]) 11/27/2017  . Tachycardia 11/26/2017  . Anemia 11/26/2017  . Leg edema, right 11/26/2017  . Dehydration   . Pressure injury of skin 11/09/2017  . Hyperbilirubinemia   . Liver metastasis ([place])   . Neutropenic fever  ([place])   . Pancytopenia ([place])   . Febrile neutropenia ([place]) 11/08/2017  . Severe sepsis ([place]) 11/08/2017  . Antineoplastic chemotherapy induced pancytopenia ([place]) 11/08/2017  . AKI (acute kidney injury) ([place]) 11/08/2017  . Encounter for antineoplastic chemotherapy   . Extensive stage primary small cell carcinoma of lung ([place]) 10/03/2017  . Liver masses   . Mass of upper lobe of right lung 09/28/2017  . Hyponatremia 09/27/2017  . Failure to thrive in adult 09/27/2017  . Metastatic cancer to liver ([place]) 09/27/2017  . Diabetes mellitus without complication ([place]) 73/71/0626  . Essential hypertension 09/27/2017    Palliative Care Assessment & Plan   Patient Profile:    Assessment:  72 year old gentleman who lives in [place], [place] with his son. He also has a daughter Robert Compton [phone].    Patient was diagnosed with extensive stage small cell lung cancer in June 2019. He is status post 5 cycles of systemic chemotherapy with partial response however recently was found to have evidence of disease progression.   Patient has been admitted to hospital medicine service with acute renal failure, probable tumor lysis syndrome. He has a recurrent large ascites and has undergone paracenteses and has also been given IV albumin. He is on broad-spectrum antibiotics for possible pneumonia/healthcare associated pneumonia. He also has severe protein calorie malnutrition.  Patient was seen and evaluated by his medical oncologist with final recommendations for the patient to consider palliative care and hospice services.  Palliative medicine team has been following for goals of care discussions. Patient was also seen and evaluated by radiation oncology, appreciate their input and recommendations.  Recommendations/Plan:  Ongoing discussions with patient and his daughter regarding code status and discharge planning (home with hospice versus SNF rehab with palliative care). Prior, his daughter  stated that the patient  does not like the experience of being inside a facility.  He stated today that he is agreeable only to going to his home on discharge.  I discussed with him that my recommendation with this goal in mind would be to be evaluated for peritoneal drain placement and transition home with the support of hospice.      He did not want to discuss code status today.  His daughter is planning on coming to the hospital tomorrow at [facility] for a family meeting.   Code Status:    Code Status Orders  (From admission, onward)         Start     Ordered   02/25/18 2025  Full code  Continuous     02/25/18 2028        Code Status History    Date Active Date Inactive Code Status Order ID Comments User Context   01/11/2018 0120 01/18/2018 1652 Full Code [id number]  Ivor Costa, MD ED   12/14/2017 0005 12/24/2017 2208 Full Code [id number]  [name], MD ED   11/26/2017 2055 12/02/2017 1558 Full Code [id number]  [name], MD Inpatient   11/08/2017 2005 11/12/2017 1833 Full Code [id number]  [name], DO ED   09/27/2017 2346 10/08/2017 2258 Full Code [id number]  [name], MD Inpatient       Prognosis:   < 3 months  Discharge Planning:  [facility] for rehab with Palliative care service follow-up vs home hospice?   Care plan was discussed with Patient, bedside RN, daughter via phone  Thank you for allowing the Palliative Medicine Team to assist in the care of this patient.   Total Time 40 Prolonged Time Billed  no       Greater than 50%  of this time was spent counseling and coordinating care related to the above assessment and plan.  Micheline Rough, MD [facility] Team [phone]  Please contact Palliative Medicine Team phone at [phone] for questions and concerns.

## 2018-03-06 NOTE — Progress Notes (Signed)
PROGRESS NOTE    Robert Compton  TML:[id number] DOB: 1945-05-29 DOA: 02/25/2018 PCP: [name], MD   Brief Narrative: Patient is a 72 year old male history of extensive stage small cell lung cancer started on immunotherapy, history of hypertension, diabetes type 2 presented to the ED with increasing weakness and increasing abdominal distention.  Patient noted to be in acute renal failure, hyperkalemic with a potassium of 6, chest x-ray showing possible infiltrates.  Patient was also treated for pneumonia.  Patient admitted with acute renal failure, hyperkalemia and worsening ascites. He has been requiring intermittent paracentesis. Evaluated by oncology, [name] who is  Recommending hospice. Palliative care meeting today at 4 PM.    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 Creatinine on admission was at 4, from prior at 1.2. Renal ultrasound negative for hydronephrosis. Monitor renal function on Lasix and spironolactone. Renal function relatively stable, creatinine 1.7 today. start sodium bicarbonate tablets.  2-Hyperkalemia; thought to be related to renal failure versus tumor lysis syndrome.  He received a dose of Kayexalate.  Resolved.    Probably tumor lysis syndrome; Patient received IV Rasburicase.  Uric acid has decreased to 2.5 from 6.  Case was discussed with  oncologist.  Hypertension; coronary on hold due to soft systolic blood pressure.   Chronic diastolic heart failure;  Continue with oral Lasix. Hold Lasix is for systolic blood pressure less than 100  Ascites, secondary to cirrhosis of the liver; He has require multiple paracentesis. Last paracentesis performed 03/04/2018.  If continued abdominal distention patient may need a Pleurx tunneled catheter however will need to discuss with IR   Denies worsening abdominal distention today  Diabetes; continue with Lantus, sliding scale insulin  Possible pneumonia; patient received 7 days course of antibiotics.  Antibiotics discontinued 03/04/2018.  Severe protein caloric malnutrition; Secondary to metastatic small cell lung cancer. Continue with supplements.  Anemia thrombocytopenia: Likely secondary to malignancy and chemotherapy. Continue to follow trend  Extensive stage small cell lung cancer with right upper lobe mass chronic back pain; Patient with very poor prognosis. Candidate for fetal chemotherapy. He was evaluated by [name] who is recommending hospice care. Away for palliative evaluation.  Stage II Buttocks both sides. Present on admission. Local care. Pressure injury.   RN Pressure Injury Documentation: Pressure Injury 11/08/17 Stage II -  Partial thickness loss of dermis presenting as a shallow open ulcer with a red, pink wound bed without slough. two small .5 cm healing stage 2 on each side inner buttock (Active)  11/08/17 2200   Location: Buttocks  Location Orientation: Mid  Staging: Stage II -  Partial thickness loss of dermis presenting as a shallow open ulcer with a red, pink wound bed without slough.  Wound Description (Comments): two small .5 cm healing stage 2 on each side inner buttock  Present on Admission: Yes    Malnutrition Type:      Malnutrition Characteristics:  Nutrition Interventions:     Estimated body mass index is 33.37 kg/m as  calculated from the following:   Height as of this encounter: 6\' 3"  (1.905 m).   Weight as of this encounter: 121.1 kg.   DVT prophylaxis: ( SCDs Code Status: code Family Communication: no family at bedside Disposition Plan: to be determined, goals of care discussion will determine disposition   Consultants:   Oncology: Dr. Lorna Few 02/26/2018  Palliative care consultation Dr. Rowe Pavy 02/27/2018.   Procedures:  Ultrasound-guided paracentesis 02/26/2018----8.4 L removed/03/01/2018-7.8 L of yellow clear fluid removed  CT abdomen and pelvis 02/25/2018  Chest x-ray 02/25/2018  Ultrasound-guided paracentesis pending for 03/04/2018   Antimicrobials:   IV aztreonam 02/25/2018>>>>>> 02/28/2018  IV vancomycin 02/25/2018>>>>> 02/28/2018  IV Levaquin 02/28/2018>>>>>> 03/04/2018  Subjective: He is alert, before having a bowel movement yesterday. Complaining of back pain.  Is aware of palliative care meeting today for 4 PM Objective: Vitals:   03/05/18 1425 03/05/18 2223 03/06/18 0553 03/06/18 0920  BP: 97/60 100/61 (!) 104/56 104/61  Pulse: 92 93 90   Resp: 16 18 16    Temp: 97.6 F (36.4 C) 98 F (36.7 C) 98.2 F (36.8 C)   TempSrc: Axillary Oral Oral   SpO2: 98% 97% 96%   Weight:   121.1 kg   Height:        Intake/Output Summary (Last 24 hours) at 03/06/2018 1314 Last data filed at 03/06/2018 1050 Gross per 24 hour  Intake 120 ml  Output 300 ml  Net -180 ml   Filed Weights   03/04/18 0419 03/05/18 0459 03/06/18 0553  Weight: 125.7 kg 120.2 kg 121.1 kg    Examination:  General exam: alert, icteric,  in no acute distress Respiratory system: normal respiratory effort, crackles at the bases Cardiovascular system: S1-S2 regular rhythm and rate Gastrointestinal system: abdomen distended, bowel  sounds present, mild tenderness Central nervous system: alert, conversant following commands Extremities: chronic venous insufficiency skin changes, symmetric  power     Data Reviewed: I have personally reviewed following labs and imaging studies  CBC: Recent Labs  Lab 03/01/18 0524 03/02/18 0527 03/03/18 0536 03/04/18 0551 03/05/18 1226 03/06/18 0547  WBC 7.3 6.3  --  6.2 7.5 8.1  NEUTROABS  --   --   --  4.6  --   --   HGB 10.1* 10.0* 9.6* 9.1* 9.6* 9.7*  HCT 31.8* 31.5* 30.1* 28.3* 29.4* 29.4*  MCV 111.2* 109.8*  --  109.3* 106.5* 107.3*  PLT 37* 30*  --  23* 20* 20*   Basic Metabolic Panel: Recent Labs  Lab 02/28/18 0603  03/02/18 0527 03/03/18 0536 03/04/18 0551 03/05/18 1226 03/06/18 0547  NA 138   < > 139 139 140 139 137  K 3.9   < > 3.8 4.0 4.0 4.4 4.7  CL 108   < > 110 113* 112* 111 108  CO2 22   < > 21* 17* 18* 19* 19*  GLUCOSE 193*   < > 185* 153* 154* 198* 167*  BUN 41*   < > 42* 38* 41* 44* 48*  CREATININE 2.63*   < > 2.23* 1.80* 1.78* 1.83* 1.76*  CALCIUM 8.0*   < > 8.2* 8.4* 9.0 8.9 9.1  PHOS 4.1  --  4.1  --   --   --   --    < > = values in this interval not displayed.   GFR: Estimated Creatinine Clearance: 54.7 mL/min (A) (by C-G formula based on SCr of  1.76 mg/dL (H)). Liver Function Tests: Recent Labs  Lab 02/28/18 0603 03/01/18 0524 03/04/18 0551 03/05/18 1226  AST  --  54* 43* 42*  ALT  --  32 24 27  ALKPHOS  --  100 91 105  BILITOT  --  4.1* 4.3* 5.0*  PROT  --  6.0* 6.0* 5.8*  ALBUMIN 2.6* 2.5* 3.1* 2.8*   No results for input(s): LIPASE, AMYLASE in the last 168 hours. No results for input(s): AMMONIA in the last 168 hours. Coagulation Profile: No results for input(s): INR, PROTIME in the last 168 hours. Cardiac Enzymes: No results for input(s): CKTOTAL, CKMB, CKMBINDEX, TROPONINI in the last 168 hours. BNP (last 3 results) No results for input(s): PROBNP in the last 8760 hours. HbA1C: No results for input(s): HGBA1C in the last 72 hours. CBG: Recent Labs  Lab 03/05/18 1140 03/05/18 1654 03/05/18 2218 03/06/18 0754 03/06/18 1231  GLUCAP 192* 155* 166* 156* 154*   Lipid  Profile: No results for input(s): CHOL, HDL, LDLCALC, TRIG, CHOLHDL, LDLDIRECT in the last 72 hours. Thyroid Function Tests: No results for input(s): TSH, T4TOTAL, FREET4, T3FREE, THYROIDAB in the last 72 hours. Anemia Panel: No results for input(s): VITAMINB12, FOLATE, FERRITIN, TIBC, IRON, RETICCTPCT in the last 72 hours. Sepsis Labs: No results for input(s): PROCALCITON, LATICACIDVEN in the last 168 hours.  Recent Results (from the past 240 hour(s))  Urine culture     Status: None   Collection Time: 02/25/18  6:34 PM  Result Value Ref Range Status   Specimen Description   Final    URINE, CLEAN CATCH Performed at [facility], [place] [address], [place], [place] [address]    Special Requests   Final    NONE Performed at [facility], [place] [address] [place], [place] [address]    Culture   Final    NO GROWTH Performed at [facility], [place] [address] [place], [place] [address]    Report Status 02/27/2018 FINAL  Final  Blood culture (routine x 2)     Status: None   Collection Time: 02/25/18  8:13 PM  Result Value Ref Range Status   Specimen Description   Final    BLOOD LEFT WRIST Performed at [facility], [place] [address], [place], [place] [address]    Special Requests   Final    BOTTLES DRAWN AEROBIC AND ANAEROBIC Blood Culture adequate volume Performed at [place] [address], [place], [place] [address]    Culture   Final    NO GROWTH 5 DAYS Performed at [facility], [place] [address], [place], [place] [address]    Report Status 03/03/2018 FINAL  Final  Blood culture (routine x 2)     Status: None   Collection Time: 02/25/18 10:58 PM  Result Value Ref Range Status   Specimen Description   Final    BLOOD LEFT HAND Performed at [facility], [place] [address], [place], [place] [address]    Special Requests   Final    AEROBIC BOTTLE ONLY Blood Culture results may not be optimal due to an  inadequate volume of blood received in culture bottles Performed at [place] [address], [place], [place] [address]    Culture   Final    NO GROWTH 5 DAYS Performed at [facility], [place] [address], [place], [place] [address]    Report Status 03/03/2018 FINAL  Final  MRSA PCR Screening     Status: Abnormal   Collection Time: 02/26/18  5:08 AM  Result Value Ref Range Status   MRSA by PCR POSITIVE (A) NEGATIVE Final    Comment:        The GeneXpert MRSA Assay (FDA approved for NASAL specimens only), is one component of a comprehensive MRSA colonization surveillance program. It is not intended to diagnose MRSA infection nor to guide or monitor treatment for MRSA infections. RESULT CALLED TO, READ BACK BY AND VERIFIED WITH: CAMPBELL,M RN AT 5885 02/26/18 BY TIBBITTS,K Performed at [facility], [place] [address], [place], [place] [address]   Culture, body fluid-bottle     Status: None   Collection Time: 03/01/18  1:45 PM  Result Value Ref Range Status   Specimen Description PERITONEAL  Final   Special Requests NONE  Final   Culture   Final    NO GROWTH 5 DAYS Performed at [facility], [address] [place], [place] [address]    Report Status 03/06/2018 FINAL  Final  Gram stain     Status: None   Collection Time: 03/01/18  1:45 PM  Result Value Ref Range Status   Specimen Description PERITONEAL  Final   Special Requests NONE  Final   Gram Stain   Final    FEW WBC PRESENT,BOTH PMN AND MONONUCLEAR NO ORGANISMS SEEN Performed at [facility], [address] [place],  [address]    Report Status 03/01/2018 FINAL  Final         Radiology Studies: US Paracentesis  Result Date: 03/04/2018 INDICATION: Patient with history of small cell lung cancer, recurrent ascites. Request made for therapeutic paracentesis. EXAM: ULTRASOUND GUIDED THERAPEUTIC PARACENTESIS MEDICATIONS: None COMPLICATIONS: None  immediate. PROCEDURE: Informed written consent was obtained from the patient after a discussion of the risks, benefits and alternatives to treatment. A timeout was performed prior to the initiation of the procedure. Initial ultrasound scanning demonstrates a large amount of ascites within the left lower abdominal quadrant. The left lower abdomen was prepped and draped in the usual sterile fashion. 1% lidocaine was used for local anesthesia. Following this, a 19 gauge, 7-cm, Yueh catheter was introduced. An ultrasound image was saved for documentation purposes. The paracentesis was performed. The catheter was removed and a dressing was applied. The patient tolerated the procedure well without immediate post procedural complication. FINDINGS: A total of approximately 7.8 liters of yellow fluid was removed. IMPRESSION: Successful ultrasound-guided therapeutic paracentesis yielding 7.8 liters of peritoneal fluid. Read by: Rowe Robyn, PA-C Electronically Signed   By: Lucrezia Europe M.D.   On: 03/04/2018 17:16        Scheduled Meds: . furosemide  40 mg Oral Daily  . insulin aspart  0-9 Units Subcutaneous TID WC  . insulin glargine  10 Units Subcutaneous QHS  . lactulose  20 g Oral TID  . lidocaine  1 patch Transdermal Q24H  . omega-3 acid ethyl esters  1 g Oral QHS  . pantoprazole  40 mg Oral Daily  . polyethylene glycol  17 g Oral Daily  . spironolactone  25 mg Oral Daily  . vitamin B-12  100 mcg Oral Daily   Continuous Infusions:   LOS: 9 days    Time spent: 35 minutes    Elmarie Shiley, MD [facility] Pager [phone]  If 7PM-7AM, please contact night-coverage [email or web address] Password [email or web address] 03/06/2018, 1:14 PM

## 2018-03-06 NOTE — Care Management Note (Signed)
Case Management Note  Patient Details  Name: Robert Compton MRN: [id number] Date of Birth: 06-04-1945  Subjective/Objective: NSCL,HCAP,ARF,CHF,Ascites,malnutriton,s/p paracentesis.Noted palliative to meet w/patient/dtr this evening-discuss GOC.Full code.6 adm in 6 months.possible peritoneal drain placement.SNF w/PCS vs Home with hospice. CM/CSW will continue to follow-await recc,& consults.                  Action/Plan:d/c home.   Expected Discharge Date:  (unknown)               Expected Discharge Plan:  Skilled Nursing Facility  In-House Referral:  Clinical Social Work  Discharge planning Services  CM Consult  Post Acute Care Choice:    Choice offered to:     DME Arranged:    DME Agency:     HH Arranged:    [place] Agency:     Status of Service:  In process, will continue to follow  If discussed at Long Length of Stay Meetings, dates discussed:    Additional Comments:  Dessa Phi, RN 03/06/2018, 11:30 AM

## 2018-03-07 ENCOUNTER — Inpatient Hospital Stay: Payer: BLUE CROSS/BLUE SHIELD

## 2018-03-07 ENCOUNTER — Inpatient Hospital Stay (HOSPITAL_COMMUNITY): Payer: BLUE CROSS/BLUE SHIELD

## 2018-03-07 LAB — CBC WITH DIFFERENTIAL/PLATELET
ABS IMMATURE GRANULOCYTES: 0.25 10*3/uL — AB (ref 0.00–0.07)
Basophils Absolute: 0 10*3/uL (ref 0.0–0.1)
Basophils Relative: 0 %
Eosinophils Absolute: 0.1 10*3/uL (ref 0.0–0.5)
Eosinophils Relative: 1 %
HCT: 29 % — ABNORMAL LOW (ref 39.0–52.0)
HEMOGLOBIN: 9.7 g/dL — AB (ref 13.0–17.0)
IMMATURE GRANULOCYTES: 3 %
LYMPHS PCT: 10 %
Lymphs Abs: 0.8 10*3/uL (ref 0.7–4.0)
MCH: 35.5 pg — AB (ref 26.0–34.0)
MCHC: 33.4 g/dL (ref 30.0–36.0)
MCV: 106.2 fL — AB (ref 80.0–100.0)
MONO ABS: 0.6 10*3/uL (ref 0.1–1.0)
MONOS PCT: 7 %
NEUTROS ABS: 6 10*3/uL (ref 1.7–7.7)
NEUTROS PCT: 79 %
Platelets: 17 10*3/uL — CL (ref 150–400)
RBC: 2.73 MIL/uL — AB (ref 4.22–5.81)
RDW: 19.2 % — ABNORMAL HIGH (ref 11.5–15.5)
WBC: 7.8 10*3/uL (ref 4.0–10.5)
nRBC: 0 % (ref 0.0–0.2)

## 2018-03-07 LAB — GLUCOSE, CAPILLARY
GLUCOSE-CAPILLARY: 155 mg/dL — AB (ref 70–99)
GLUCOSE-CAPILLARY: 153 mg/dL — AB (ref 70–99)
GLUCOSE-CAPILLARY: 184 mg/dL — AB (ref 70–99)
Glucose-Capillary: 158 mg/dL — ABNORMAL HIGH (ref 70–99)
Glucose-Capillary: 143 mg/dL — ABNORMAL HIGH (ref 70–99)

## 2018-03-07 LAB — COMPREHENSIVE METABOLIC PANEL
ALBUMIN: 2.6 g/dL — AB (ref 3.5–5.0)
ALT: 31 U/L (ref 0–44)
ANION GAP: 8 (ref 5–15)
AST: 56 U/L — AB (ref 15–41)
Alkaline Phosphatase: 130 U/L — ABNORMAL HIGH (ref 38–126)
BILIRUBIN TOTAL: 5.8 mg/dL — AB (ref 0.3–1.2)
BUN: 54 mg/dL — AB (ref 8–23)
CO2: 19 mmol/L — AB (ref 22–32)
Calcium: 9 mg/dL (ref 8.9–10.3)
Chloride: 107 mmol/L (ref 98–111)
Creatinine, Ser: 1.98 mg/dL — ABNORMAL HIGH (ref 0.61–1.24)
GFR calc Af Amer: 38 mL/min — ABNORMAL LOW (ref >60)
GFR calc non Af Amer: 32 mL/min — ABNORMAL LOW (ref >60)
GLUCOSE: 167 mg/dL — AB (ref 70–99)
POTASSIUM: 4.7 mmol/L (ref 3.5–5.1)
SODIUM: 134 mmol/L — AB (ref 135–145)
TOTAL PROTEIN: 5.8 g/dL — AB (ref 6.5–8.1)

## 2018-03-07 LAB — PROTIME-INR
INR: 1.41
PROTHROMBIN TIME: 17.1 s — AB (ref 11.4–15.2)

## 2018-03-07 MED ORDER — LIDOCAINE HCL 1 % IJ SOLN
INTRAMUSCULAR | Status: AC
  Filled 2018-03-07: qty 10

## 2018-03-07 MED ORDER — SODIUM CHLORIDE 0.9% IV SOLUTION: Freq: Once | INTRAVENOUS | Status: AC

## 2018-03-07 MED ORDER — CLINDAMYCIN PHOSPHATE 900 MG/50ML IV SOLN
900.0000 mg | Freq: Once | INTRAVENOUS | Status: AC
  Administered 2018-03-10: 900 mg via INTRAVENOUS

## 2018-03-07 NOTE — Progress Notes (Signed)
Referring Physician(s): Freeman,G/Regalado,B  Supervising Physician: Markus Daft  Patient Status:  [facility] - In-[facility]  Chief Complaint:  Metastatic lung cancer, recurrent symptomatic ascites  Subjective: Patient familiar to IR service from prior liver lesion biopsy on 09/30/2017 and multiple large volume paracenteses, last on 11/19 yielding 7.8 L.  Fluid cytology has been negative.  He has a known history of metastatic small cell lung cancer, hypertension, diabetes, thrombocytopenia/anemia, CHF and renal failure.  Per oncology the patient is not a good candidate for any future treatment and clinical status is declining rapidly with disease progression. Palliative care is recommending transition home with support of hospice as well as tunneled peritoneal drain placement to assist with periodic ascites removal for comfort purposes.  He is a little more lethargic today with continued back pain and some abdominal distention.  He is afebrile, WBC nl and platelet count is 17k today.   Past Medical History:  Diagnosis Date  . Arthritis   . Cancer ([place])   . Diabetes mellitus without complication ([place])   . Hypertension      Allergies: Amoxicillin-pot clavulanate and Nitrofurantoin  Medications: Prior to Admission medications   Medication Sig Start Date End Date Taking? Authorizing Provider  carvedilol (COREG) 3.125 MG tablet Take 1 tablet (3.125 mg total) by mouth 2 (two) times daily with a meal. 12/24/17  Yes Mariel Aloe, MD  CINNAMON PO Take 600 mg by mouth 2 (two) times daily.   Yes [provider]  Cyanocobalamin (VITAMIN B-12 PO) Take 1 tablet by mouth daily.   Yes [provider]  fish oil-omega-3 fatty acids 1000 MG capsule Take 1 g by mouth 2 (two) times daily.   Yes [provider]  furosemide (LASIX) 20 MG tablet Take 3 tablets (60 mg total) by mouth 2 (two) times daily. Patient taking differently: Take 40 mg by mouth 2 (two) times daily.  01/18/18  Yes  Oswald Hillock, MD  HYDROcodone-acetaminophen (NORCO) 7.5-325 MG tablet Take 1 tablet by mouth every 6 (six) hours as needed for moderate pain. 02/25/18  Yes Gus Height, MD  loratadine (CLARITIN) 10 MG tablet Take 1 tablet (10 mg total) by mouth daily. 11/13/17  Yes Eugenie Filler, MD  metFORMIN (GLUCOPHAGE) 1000 MG tablet Take 1,000 mg by mouth 2 (two) times daily with a meal.   Yes [provider]  omeprazole (PRILOSEC) 20 MG capsule Take 20 mg by mouth daily.   Yes [provider]  Potassium Chloride ER 20 MEQ TBCR Take 20 mEq by mouth 2 (two) times daily. 12/02/17  Yes Dessa Phi, DO  PRESCRIPTION MEDICATION Take 10 mLs by mouth every 12 (twelve) hours as needed (pain). Vicodin liquid 7.5/300   Yes [provider]  Red Yeast Rice 600 MG TABS Take 1 tablet by mouth 2 (two) times daily.    Yes [provider]  traMADol (ULTRAM) 50 MG tablet Take 1 tablet (50 mg total) by mouth every 6 (six) hours as needed for moderate pain. 02/17/18  Yes Curt Bears, MD  doxycycline (VIBRA-TABS) 100 MG tablet Take 1 tablet (100 mg total) by mouth 2 (two) times daily. Patient not taking: Reported on 02/25/2018 01/20/18   Curt Bears, MD  fluticasone [facility]) 50 MCG/ACT nasal spray Place 2 sprays into both nostrils daily as needed for allergies. Patient not taking: Reported on 02/25/2018 11/12/17   Eugenie Filler, MD  insulin aspart (NOVOLOG) 100 UNIT/ML injection CBG < 70: implement hypoglycemia protocol CBG 70 - 120:  0 units CBG 121 - 150: 2 units CBG 151 - 200: 3 units CBG 201 - 250: 5 units CBG 251 - 300: 8 units CBG 301 - 350: 11 units CBG 351 - 400: 15 units CBG > 400: call MD Patient not taking: Reported on 02/25/2018 10/08/17   Lavina Hamman, MD  insulin glargine (LANTUS) 100 UNIT/ML injection Inject 0.1 mLs (10 Units total) into the skin at bedtime. Patient not taking: Reported on 02/25/2018 01/18/18   Oswald Hillock, MD  nicotine (NICODERM CQ - DOSED IN  MG/24 HOURS) 21 mg/24hr patch Place 1 patch (21 mg total) onto the skin daily. Patient not taking: Reported on 02/25/2018 11/12/17   Eugenie Filler, MD  polyethylene glycol [facility]) packet Take 17 g by mouth daily. Patient not taking: Reported on 02/25/2018 10/09/17   Lavina Hamman, MD  senna-docusate (SENOKOT-S) 8.6-50 MG tablet Take 1 tablet by mouth at bedtime as needed for mild constipation. Patient not taking: Reported on 02/25/2018 01/18/18   Oswald Hillock, MD     Vital Signs: BP 99/82 (BP Location: Left Arm)   Pulse 96   Temp 97.8 F (36.6 C) (Oral)   Resp 18   Ht 6\' 3"  (1.905 m)   Wt 263 lb 10.7 oz (119.6 kg)   SpO2 100%   BMI 32.96 kg/m   Physical Exam patient awake but lethargic.  Will respond to questions slowly; chest clear to auscultation bilaterally anteriorly.  Heart with regular rate and rhythm; abdomen distended, positive bowel sounds, some mild diffuse tenderness to palpation; bilat lower extremity edema noted; scattered ecchymoses on extremities  Imaging: US Paracentesis  Result Date: 03/04/2018 INDICATION: Patient with history of small cell lung cancer, recurrent ascites. Request made for therapeutic paracentesis. EXAM: ULTRASOUND GUIDED THERAPEUTIC PARACENTESIS MEDICATIONS: None COMPLICATIONS: None immediate. PROCEDURE: Informed written consent was obtained from the patient after a discussion of the risks, benefits and alternatives to treatment. A timeout was performed prior to the initiation of the procedure. Initial ultrasound scanning demonstrates a large amount of ascites within the left lower abdominal quadrant. The left lower abdomen was prepped and draped in the usual sterile fashion. 1% lidocaine was used for local anesthesia. Following this, a 19 gauge, 7-cm, Yueh catheter was introduced. An ultrasound image was saved for documentation purposes. The paracentesis was performed. The catheter was removed and a dressing was applied. The patient  tolerated the procedure well without immediate post procedural complication. FINDINGS: A total of approximately 7.8 liters of yellow fluid was removed. IMPRESSION: Successful ultrasound-guided therapeutic paracentesis yielding 7.8 liters of peritoneal fluid. Read by: Rowe Izik, PA-C Electronically Signed   By: Lucrezia Europe M.D.   On: 03/04/2018 17:16    Labs:  CBC: Recent Labs    03/04/18 0551 03/05/18 1226 03/06/18 0547 03/07/18 0530  WBC 6.2 7.5 8.1 7.8  HGB 9.1* 9.6* 9.7* 9.7*  HCT 28.3* 29.4* 29.4* 29.0*  PLT 23* 20* 20* 17*    COAGS: Recent Labs    12/18/17 0824 01/10/18 2140 01/11/18 0447 02/25/18 1708 03/07/18 0530  INR 1.15 1.04  --  1.25 1.41  APTT 33  --  27  --   --     BMP: Recent Labs    03/04/18 0551 03/05/18 1226 03/06/18 0547 03/07/18 0530  NA 140 139 137 134*  K 4.0 4.4 4.7 4.7  CL 112* 111 108 107  CO2 18* 19* 19* 19*  GLUCOSE 154* 198* 167* 167*  BUN 41* 44*  48* 54*  CALCIUM 9.0 8.9 9.1 9.0  CREATININE 1.78* 1.83* 1.76* 1.98*  GFRNONAA 37* 36* 37* 32*  GFRAA 43* 41* 43* 38*    LIVER FUNCTION TESTS: Recent Labs    03/01/18 0524 03/04/18 0551 03/05/18 1226 03/07/18 0530  BILITOT 4.1* 4.3* 5.0* 5.8*  AST 54* 43* 42* 56*  ALT 32 24 27 31   ALKPHOS 100 91 105 130*  PROT 6.0* 6.0* 5.8* 5.8*  ALBUMIN 2.5* 3.1* 2.8* 2.6*    Assessment and Plan: Pt with known history of metastatic small cell lung cancer, hypertension, diabetes, thrombocytopenia/anemia, CHF , renal failure and recurrent symptomatic ascites requiring large-volume paracenteses, latest on 11/19 yielding 7.8 liters.  Fluid cytology has been negative.  Per oncology the patient is not a good candidate for any future treatment and clinical status is declining rapidly with disease progression.Palliative care is recommending transition home with support of hospice as well as tunneled peritoneal drain placement to assist with periodic ascites removal for comfort purposes.  Case has been  discussed with Dr. Kathlene Cote.  Earliest tunneled peritoneal drain can be performed is on 11/25.  Patient will most likely require platelet transfusion prior to procedure to ensure counts between 40-50 K.  In the meantime partial therapeutic paracentesis can be performed for patient comfort.  Details/risks of procedure, including but not limited to, internal bleeding, infection, injury to adjacent structures discussed with patient and daughter with their understanding and consent.  Above discussed with Dr. Tyrell Antonio.   Electronically Signed: D. Rowe Tramaine, PA-C 03/07/2018, 12:20 PM   I spent a total of 30 minutes at the the patient's bedside AND on the patient's hospital floor or unit, greater than 50% of which was counseling/coordinating care for tunneled peritoneal drain placement /paracentesis    Patient ID: Robert Compton, male   DOB: 07-29-1945, 72 y.o.   MRN: [id number]

## 2018-03-07 NOTE — Plan of Care (Addendum)
Pt not compliant with Tx plan to reposition Q 2 hrs. Pt had paracentesis on 11/19 and had 7.8 L of fluid  removed.

## 2018-03-07 NOTE — Care Management Note (Signed)
Case Management Note  Patient Details  Name: Robert Compton MRN: [id number] Date of Birth: 1945-10-14  Subjective/Objective:                  tcf-Gene Domingo Cocking MD patient and family would like to go home with hospice and palliative care of [place]/tct-Mary Bradd Burner contacted and referral given.  Action/Plan: Referral done   Expected Discharge Date:  (unknown)               Expected Discharge Plan:  [place]  In-House Referral:  Clinical Social Work  Discharge planning Services  CM Consult  Post Acute Care Choice:    Choice offered to:     DME Arranged:    DME Agency:     HH Arranged:    [place] Agency:     Status of Service:  In process, will continue to follow  If discussed at Long Length of Stay Meetings, dates discussed:    Additional Comments:  Leeroy Cha, RN 03/07/2018, 11:30 AM

## 2018-03-07 NOTE — Progress Notes (Signed)
Daily Progress Note   Patient Name: Robert Compton       Date: 03/07/2018 DOB: 1946/04/05  Age: 72 y.o. MRN#: [id number] Attending Physician: Elmarie Shiley, MD Primary Care Physician: [name], MD Admit Date: 02/25/2018  Reason for Consultation/Follow-up: Establishing goals of care  Subjective: Chart reviewed and discussed with bedside RN.    Patient is lying in bed on entering room.  No family present.  Reviewed plan for working to get peritoneal catheter then discharge home with hospice support.  He is agreeable to this.  Discussed again regarding Code Status.  He again deferred decision on this.  I talked with him about options again, and he expressed that he does not want to change anything at this time, but did report being agreeable to considering changing to DNR at time of discharge as he does not want to return to the hospital once he leaves.  Length of Stay: 10  Current Medications: Scheduled Meds:  . sodium chloride   Intravenous Once  . furosemide  40 mg Oral Daily  . insulin aspart  0-9 Units Subcutaneous TID WC  . insulin glargine  10 Units Subcutaneous QHS  . lactulose  20 g Oral TID  . lidocaine  1 patch Transdermal Q24H  . omega-3 acid ethyl esters  1 g Oral QHS  . pantoprazole  40 mg Oral Daily  . polyethylene glycol  17 g Oral Daily  . sodium bicarbonate  650 mg Oral BID  . spironolactone  25 mg Oral Daily  . vitamin B-12  100 mcg Oral Daily    Continuous Infusions: . [START ON 03/10/2018] clindamycin (CLEOCIN) IV      PRN Meds: acetaminophen **OR** acetaminophen, HYDROmorphone (DILAUDID) injection, ondansetron **OR** ondansetron (ZOFRAN) IV, oxyCODONE  Physical Exam         Regular breathing S 1 S 2 Complaints of back pain Has some edema,  has some venous stasis changes both lower extremities Abdomen appears distended Awake alert but flat in affect  Appears weaker  Vital Signs: BP 109/65 (BP Location: Left Arm)   Pulse 92   Temp (!) 97.4 F (36.3 C) (Oral)   Resp 14   Ht 6\' 3"  (1.905 m)   Wt 119.6 kg   SpO2 99%   BMI 32.96 kg/m  SpO2: SpO2: 99 % O2  Device: O2 Device: Room Air O2 Flow Rate: O2 Flow Rate (L/min): 0 L/min  Intake/output summary:   Intake/Output Summary (Last 24 hours) at 03/07/2018 1518 Last data filed at 03/07/2018 1430 Gross per 24 hour  Intake 534 ml  Output 750 ml  Net -216 ml   LBM: Last BM Date: 03/06/18 Baseline Weight: Weight: 118.8 kg Most recent weight: Weight: 119.6 kg       Palliative Assessment/Data:      Patient Active Problem List   Diagnosis Date Noted  . Palliative care by specialist   . Goals of care, counseling/discussion   . Hyperkalemia 02/26/2018  . 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 Ascites   . HCAP (healthcare-associated pneumonia)   . Acute renal failure (ARF) ([place]) 02/25/2018  . Anasarca 01/11/2018  . Chronic diastolic CHF (congestive heart failure) ([place]) 01/11/2018  . Abdominal pain 01/11/2018  . Elevated lactic acid level 01/11/2018  . Malnutrition of moderate degree 12/20/2017  . SBP (spontaneous bacterial peritonitis) ([place])   . Abdominal distention   . Hypervolemia   . Edema 12/13/2017  . Severe protein-calorie malnutrition ([place]) 12/13/2017  . Abnormal liver function 12/13/2017  . Leukocytosis 12/13/2017  . Acute on chronic diastolic (congestive) heart failure ([place]) 11/27/2017  . Tachycardia 11/26/2017  . Anemia 11/26/2017  . Leg edema, right 11/26/2017  . Dehydration   . Pressure injury of skin 11/09/2017  . Hyperbilirubinemia   . Liver metastasis ([place])   . Neutropenic fever ([place])   . Pancytopenia ([place])   . Febrile neutropenia ([place]) 11/08/2017  . Severe sepsis ([place]) 11/08/2017  . Antineoplastic  chemotherapy induced pancytopenia ([place]) 11/08/2017  . AKI (acute kidney injury) ([place]) 11/08/2017  . Encounter for antineoplastic chemotherapy   . Extensive stage primary small cell carcinoma of lung ([place]) 10/03/2017  . Liver masses   . Mass of upper lobe of right lung 09/28/2017  . Hyponatremia 09/27/2017  . Failure to thrive in adult 09/27/2017  . Metastatic cancer to liver ([place]) 09/27/2017  . Diabetes mellitus without complication ([place]) 91/47/8295  . Essential hypertension 09/27/2017    Palliative Care Assessment & Plan   Patient Profile:    Assessment:  72 year old gentleman who lives in [place], [place] with his son. He also has a daughter [name] [phone].    Patient was diagnosed with extensive stage small cell lung cancer in June 2019. He is status post 5 cycles of systemic chemotherapy with partial response however recently was found to have evidence of disease progression.   Patient has been admitted to hospital medicine service with acute renal failure, probable tumor lysis syndrome. He has a recurrent large ascites and has undergone paracenteses and has also been given IV albumin. He is on broad-spectrum antibiotics for possible pneumonia/healthcare associated pneumonia. He also has severe protein calorie malnutrition.  Patient was seen and evaluated by his medical oncologist with final recommendations for the patient to consider palliative care and hospice services.  Palliative medicine team has been following for goals of care discussions. Patient was also seen and evaluated by radiation oncology, appreciate their input and recommendations.  Recommendations/Plan:  - Goal is to transition home with support of hospice.  Referral placed.  Family would like to be present at time of meeting with hospice. - I believe that he would benefit from placement of pleurex prior to discharge to allow hospice to drain ascites at home.  Appreciate IR eval and recs. - Will  continue to  address code status.  Did not push issue today and will continue to engage as he is emotionally able.  Remaining FULL Code does not preclude him from hospice enrollment.  He indicated today that he may want to change to DNR at time of discharge home with hospice services.  Code Status:    Code Status Orders  (From admission, onward)         Start     Ordered   02/25/18 2025  Full code  Continuous     02/25/18 2028        Code Status History    Date Active Date Inactive Code Status Order ID Comments User Context   01/11/2018 0120 01/18/2018 1652 Full Code [id number]  Ivor Costa, MD ED   12/14/2017 0005 12/24/2017 2208 Full Code [id number]  [name], MD ED   11/26/2017 2055 12/02/2017 1558 Full Code [id number]  [name], MD Inpatient   11/08/2017 2005 11/12/2017 1833 Full Code [id number]  [name], DO ED   09/27/2017 2346 10/08/2017 2258 Full Code [id number]  [name], MD Inpatient       Prognosis:   < 3 months  Discharge Planning:  Plan for home hospice once pleurex can be placed   Care plan was discussed with Patient, bedside RN, family  Thank you for allowing the Palliative Medicine Team to assist in the care of this patient.  Total Time 30 Prolonged Time Billed No        Greater than 50%  of this time was spent counseling and coordinating care related to the above assessment and plan.  Micheline Rough, MD [facility] Team [phone]  Please contact Palliative Medicine Team phone at [phone] for questions and concerns.

## 2018-03-07 NOTE — Progress Notes (Signed)
[facility]) hospital liaison note.  [facility] Liaison: RN visit   Notified by Velva Harman, [facility] of patient/family request for [facility] services at home after discharge. Chart and patient information under review by [facility] physician. Hospice eligibility pending at this time.  Spoke with daughter, [name] by phone. She is not available to meet today. [facility] liaison will meet with her tomorrow 03/08/2018 at 14:00 to discuss services and answer questions.   Please call with any hospice related questions.   Thank you for this referral.   Farrel Gordon, RN, [facility] Liaison [phone] ? Hospital liaisons are now on [place].

## 2018-03-07 NOTE — Progress Notes (Signed)
PROGRESS NOTE    Robert Compton  KGU:[id number] DOB: 04/01/1946 DOA: 02/25/2018 PCP: [name], MD   Brief Narrative: Patient is a 72 year old male history of extensive stage small cell lung cancer started on immunotherapy, history of hypertension, diabetes type 2 presented to the ED with increasing weakness and increasing abdominal distention.  Patient noted to be in acute renal failure, hyperkalemic with a potassium of 6, chest x-ray showing possible infiltrates.  Patient was also treated for pneumonia.  Patient admitted with acute renal failure, hyperkalemia and worsening ascites. He has been requiring intermittent paracentesis. Evaluated by oncology, [name] who is  Recommending hospice. Palliative care meeting today at 4 PM.    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 Creatinine on admission was at 4, from prior at 1.2. Renal ultrasound negative for hydronephrosis. Monitor renal function on Lasix and spironolactone. Renal function , creatinine 1.9 Continue with sodium bicarbonate tablets.  2-Hyperkalemia; thought to be related to renal failure versus tumor lysis syndrome.  He received a dose of Kayexalate.  Resolved.    Probably tumor lysis syndrome; Patient received IV Rasburicase.  Uric acid has decreased to 2.5 from 6.  Case was discussed with  oncologist.  Hypertension; coronary on hold due to soft systolic blood pressure.   Chronic diastolic heart failure;  Continue with oral Lasix. Hold Lasix is for systolic blood pressure less than 100  Ascites, secondary to cirrhosis of the liver; He has require multiple paracentesis. Last paracentesis performed 03/04/2018.  Pleurx catheter planned for Monday. Will likely require platelet transfusion on Sunday. For possible paracentesis today.  Diabetes; continue with Lantus, sliding scale insulin  Possible pneumonia; patient received 7 days course of antibiotics.  Antibiotics discontinued 03/04/2018.  Severe protein caloric malnutrition; Secondary to metastatic small cell lung cancer. Continue with supplements.  Anemia thrombocytopenia: Likely secondary to malignancy and chemotherapy. Continue to follow trend  Extensive stage small cell lung cancer with right upper lobe mass chronic back pain; Patient with very poor prognosis. Candidate for fetal chemotherapy. He was evaluated by [name] who is recommending hospice care. Plan to discharge home with hospice  Stage II Buttocks both sides. Present on admission. Local care. Pressure injury.   RN Pressure Injury Documentation: Pressure Injury 11/08/17 Stage II -  Partial thickness loss of dermis presenting as a shallow open ulcer with a red, pink wound bed without slough. two small .5 cm healing stage 2 on each side inner buttock (Active)  11/08/17 2200   Location: Buttocks  Location Orientation: Mid  Staging: Stage II -  Partial thickness loss of dermis presenting as a shallow open ulcer with a red, pink wound bed without slough.  Wound Description (Comments): two small .5 cm healing stage 2 on each side inner buttock  Present on Admission: Yes    Malnutrition Type:      Malnutrition Characteristics:      Nutrition Interventions:  Estimated body mass index is 32.96 kg/m as calculated from the following:    Height as of this encounter: 6\' 3"  (1.905 m).   Weight as of this encounter: 119.6 kg.   DVT prophylaxis: ( SCDs Code Status: code Family Communication: no family at bedside Disposition Plan: to be determined, goals of care discussion will determine disposition   Consultants:   Oncology: Dr. Lorna Few 02/26/2018  Palliative care consultation Dr. Rowe Pavy 02/27/2018.   Procedures:  Ultrasound-guided paracentesis 02/26/2018----8.4 L removed/03/01/2018-7.8 L of yellow clear fluid removed  CT abdomen and pelvis 02/25/2018  Chest x-ray 02/25/2018  Ultrasound-guided paracentesis pending for 03/04/2018   Antimicrobials:   IV aztreonam 02/25/2018>>>>>> 02/28/2018  IV vancomycin 02/25/2018>>>>> 02/28/2018  IV Levaquin 02/28/2018>>>>>> 03/04/2018  Subjective: He is sleepy, received pain medication. Nurse will check his blood sugar Objective: Vitals:   03/07/18 0611 03/07/18 0623 03/07/18 1305 03/07/18 1332  BP: 99/82  105/64 107/63  Pulse: 96  89 89  Resp: 18  16 16   Temp: 97.8 F (36.6 C)  97.8 F (36.6 C) 98.5 F (36.9 C)  TempSrc: Oral  Oral Oral  SpO2: 100%  95% 95%  Weight:  119.6 kg    Height:        Intake/Output Summary (Last 24 hours) at 03/07/2018 1354 Last data filed at 03/07/2018 1300 Gross per 24 hour  Intake 300 ml  Output 750 ml  Net -450 ml   Filed Weights   03/05/18 0459 03/06/18 0553 03/07/18 0623  Weight: 120.2 kg 121.1 kg 119.6 kg    Examination:  General exam: not acute distress Respiratory system; crackles at the bases Cardiovascular system: S1, S2 regular rhythm and rate Gastrointestinal system: abdomen very distended and mildly tender no rigidity Central nervous system: sleepy, will answer a few questions Extremities: chronic venous insufficiency skin changes, symmetric power     Data Reviewed: I have personally reviewed following labs and imaging studies  CBC: Recent Labs  Lab 03/02/18 0527 03/03/18 0536  03/04/18 0551 03/05/18 1226 03/06/18 0547 03/07/18 0530  WBC 6.3  --  6.2 7.5 8.1 7.8  NEUTROABS  --   --  4.6  --   --  6.0  HGB 10.0* 9.6* 9.1* 9.6* 9.7* 9.7*  HCT 31.5* 30.1* 28.3* 29.4* 29.4* 29.0*  MCV 109.8*  --  109.3* 106.5* 107.3* 106.2*  PLT 30*  --  23* 20* 20* 17*   Basic Metabolic Panel: Recent Labs  Lab 03/02/18 0527 03/03/18 0536 03/04/18 0551 03/05/18 1226 03/06/18 0547 03/07/18 0530  NA 139 139 140 139 137 134*  K 3.8 4.0 4.0 4.4 4.7 4.7  CL 110 113* 112* 111 108 107  CO2 21* 17* 18* 19* 19* 19*  GLUCOSE 185* 153* 154* 198* 167* 167*  BUN 42* 38* 41* 44* 48* 54*  CREATININE 2.23* 1.80* 1.78* 1.83* 1.76* 1.98*  CALCIUM 8.2* 8.4* 9.0 8.9 9.1 9.0  PHOS 4.1  --   --   --   --   --    GFR: Estimated Creatinine Clearance: 48.4 mL/min (A) (by C-G formula based on SCr of 1.98 mg/dL (H)). Liver Function Tests: Recent Labs  Lab 03/01/18 0524 03/04/18 0551 03/05/18 1226 03/07/18 0530  AST 54* 43* 42* 56*  ALT 32 24 27 31   ALKPHOS 100 91 105 130*  BILITOT 4.1* 4.3* 5.0* 5.8*  PROT 6.0* 6.0* 5.8* 5.8*  ALBUMIN 2.5* 3.1* 2.8* 2.6*   No results for input(s): LIPASE, AMYLASE in the last 168 hours. No results for  input(s): AMMONIA in the last 168 hours. Coagulation Profile: Recent Labs  Lab 03/07/18 0530  INR 1.41   Cardiac Enzymes: No results for input(s): CKTOTAL, CKMB, CKMBINDEX, TROPONINI in the last 168 hours. BNP (last 3 results) No results for input(s): PROBNP in the last 8760 hours. HbA1C: No results for input(s): HGBA1C in the last 72 hours. CBG: Recent Labs  Lab 03/06/18 1231 03/06/18 1816 03/06/18 2057 03/07/18 0736 03/07/18 1154  GLUCAP 154* 157* 152* 158* 155*   Lipid Profile: No results for input(s): CHOL, HDL, LDLCALC, TRIG, CHOLHDL, LDLDIRECT in the last 72 hours. Thyroid Function Tests: No results for input(s): TSH, T4TOTAL, FREET4, T3FREE, THYROIDAB in the last 72 hours. Anemia Panel: No results for input(s): VITAMINB12,  FOLATE, FERRITIN, TIBC, IRON, RETICCTPCT in the last 72 hours. Sepsis Labs: No results for input(s): PROCALCITON, LATICACIDVEN in the last 168 hours.  Recent Results (from the past 240 hour(s))  Urine culture     Status: None   Collection Time: 02/25/18  6:34 PM  Result Value Ref Range Status   Specimen Description   Final    URINE, CLEAN CATCH Performed at [facility], [place] [address] [place], [place] [address]    Special Requests   Final    NONE Performed at [facility], [place] [address], [place], [place] [address]    Culture   Final    NO GROWTH Performed at [facility], [place] [address] [place], [place] [address]    Report Status 02/27/2018 FINAL  Final  Blood culture (routine x 2)     Status: None   Collection Time: 02/25/18  8:13 PM  Result Value Ref Range Status   Specimen Description   Final    BLOOD LEFT WRIST Performed at [facility], [place] [address], [place], [place] [address]    Special Requests   Final    BOTTLES DRAWN AEROBIC AND ANAEROBIC Blood Culture adequate volume Performed at [place] [address] [place], [place] [address]    Culture   Final    NO GROWTH 5 DAYS Performed at [facility], [place] [address], [place], [place] [address]    Report Status 03/03/2018 FINAL  Final  Blood culture (routine x 2)     Status: None   Collection Time: 02/25/18 10:58 PM  Result Value Ref Range Status   Specimen Description   Final    BLOOD LEFT HAND Performed at [facility], [place] [address] [place], [place] [address]    Special Requests   Final    AEROBIC BOTTLE ONLY Blood Culture results may not be optimal due to an inadequate volume of blood received in culture bottles Performed at [place] [address], [place], [place] [address]    Culture   Final    NO GROWTH 5 DAYS Performed at [facility], [place] [address], [place], [place] [address]     Report Status 03/03/2018 FINAL  Final  MRSA PCR Screening     Status: Abnormal   Collection Time: 02/26/18  5:08 AM  Result Value Ref Range Status   MRSA by PCR POSITIVE (A) NEGATIVE Final    Comment:        The GeneXpert MRSA Assay (FDA approved for NASAL specimens only), is one component of a comprehensive MRSA colonization surveillance program. It is not intended to diagnose MRSA infection nor to guide or monitor treatment for MRSA infections.  RESULT CALLED TO, READ BACK BY AND VERIFIED WITH: CAMPBELL,M RN AT 2297 02/26/18 BY TIBBITTS,K Performed at [facility], [place] [address], [place], [place] [address]   Culture, body fluid-bottle     Status: None   Collection Time: 03/01/18  1:45 PM  Result Value Ref Range Status   Specimen Description PERITONEAL  Final   Special Requests NONE  Final   Culture   Final    NO GROWTH 5 DAYS Performed at [facility], [address], [place], [place] [address]    Report Status 03/06/2018 FINAL  Final  Gram stain     Status: None   Collection Time: 03/01/18  1:45 PM  Result Value Ref Range Status   Specimen Description PERITONEAL  Final   Special Requests NONE  Final   Gram Stain   Final    FEW WBC PRESENT,BOTH PMN AND MONONUCLEAR NO ORGANISMS SEEN Performed at [facility], [address], [place], [place] [address]    Report Status 03/01/2018 FINAL  Final         Radiology Studies: No results found.      Scheduled Meds: . sodium chloride   Intravenous Once  . furosemide  40 mg Oral Daily  . insulin aspart  0-9 Units Subcutaneous TID WC  . insulin glargine  10 Units Subcutaneous QHS  . lactulose  20 g Oral TID  . lidocaine  1 patch Transdermal Q24H  . omega-3 acid ethyl esters  1 g Oral QHS  . pantoprazole  40 mg Oral Daily  . polyethylene glycol  17 g Oral Daily  . sodium bicarbonate  650 mg Oral BID  . spironolactone  25 mg Oral Daily  . vitamin B-12  100 mcg Oral Daily    Continuous Infusions: . [START ON 03/10/2018] clindamycin (CLEOCIN) IV       LOS: 10 days    Time spent: 35 minutes    Elmarie Shiley, MD [facility] Pager [phone]  If 7PM-7AM, please contact night-coverage [email or web address] Password [facility] 03/07/2018, 1:54 PM

## 2018-03-07 NOTE — Procedures (Signed)
Ultrasound-guided  therapeutic paracentesis performed yielding 3.1 liters of golden yellow fluid. No immediate complications.  

## 2018-03-08 LAB — GLUCOSE, CAPILLARY
GLUCOSE-CAPILLARY: 205 mg/dL — AB (ref 70–99)
GLUCOSE-CAPILLARY: 191 mg/dL — AB (ref 70–99)
Glucose-Capillary: 159 mg/dL — ABNORMAL HIGH (ref 70–99)
Glucose-Capillary: 175 mg/dL — ABNORMAL HIGH (ref 70–99)
Glucose-Capillary: 227 mg/dL — ABNORMAL HIGH (ref 70–99)

## 2018-03-08 LAB — PREPARE PLATELET PHERESIS: UNIT DIVISION: 0

## 2018-03-08 LAB — BASIC METABOLIC PANEL
Anion gap: 11 (ref 5–15)
BUN: 61 mg/dL — AB (ref 8–23)
CHLORIDE: 106 mmol/L (ref 98–111)
CO2: 18 mmol/L — AB (ref 22–32)
CREATININE: 2.17 mg/dL — AB (ref 0.61–1.24)
Calcium: 9 mg/dL (ref 8.9–10.3)
GFR calc non Af Amer: 29 mL/min — ABNORMAL LOW (ref >60)
GFR, EST AFRICAN AMERICAN: 34 mL/min — AB (ref >60)
GLUCOSE: 170 mg/dL — AB (ref 70–99)
Potassium: 4.8 mmol/L (ref 3.5–5.1)
Sodium: 135 mmol/L (ref 135–145)

## 2018-03-08 LAB — BPAM PLATELET PHERESIS
Blood Product Expiration Date: 201911232359
ISSUE DATE / TIME: 201911221309
UNIT TYPE AND RH: 5100

## 2018-03-08 LAB — AMMONIA: AMMONIA: 71 umol/L — AB (ref 9–35)

## 2018-03-08 MED ORDER — SODIUM CHLORIDE 0.9 % IV SOLN
INTRAVENOUS | Status: DC
  Administered 2018-03-08: 18:00:00 via INTRAVENOUS

## 2018-03-08 MED ORDER — LACTULOSE 10 GM/15ML PO SOLN
30.0000 g | Freq: Three times a day (TID) | ORAL | Status: DC
  Administered 2018-03-08 – 2018-03-09 (×3): 30 g via ORAL
  Filled 2018-03-08 (×3): qty 45

## 2018-03-08 NOTE — Progress Notes (Signed)
PROGRESS NOTE    Robert Compton  NAT:[id number] DOB: 06/26/1945 DOA: 02/25/2018 PCP: [name], MD   Brief Narrative: Patient is a 72 year old male history of extensive stage small cell lung cancer started on immunotherapy, history of hypertension, diabetes type 2 presented to the ED with increasing weakness and increasing abdominal distention.  Patient noted to be in acute renal failure, hyperkalemic with a potassium of 6, chest x-ray showing possible infiltrates.  Patient was also treated for pneumonia.  Patient admitted with acute renal failure, hyperkalemia and worsening ascites. He has been requiring intermittent paracentesis. Evaluated by oncology, [name] who is  Recommending hospice. Palliative care meeting today at 4 PM.    Assessment & Plan:   Principal Problem:   Acute renal failure (ARF) ([place]) Active Problems:   Diabetes mellitus without complication ([place])   Essential hypertension   Extensive stage primary small cell carcinoma of lung (HCC)   Pancytopenia (HCC)   Severe protein-calorie malnutrition (HCC)   Chronic diastolic CHF (congestive heart failure) (HCC)   Hyperkalemia   Tumor lysis syndrome following antineoplastic drug therapy   Ascites   HCAP (healthcare-associated pneumonia)   Palliative care by specialist   Goals of care, counseling/discussion   1-Acute Renal Failure;  Prerenal due to poor oral intake, and recent large volume paracentesis. Creatinine on admission was at 4, from prior at 1.2. Renal ultrasound negative for hydronephrosis. Monitor renal function on Lasix and spironolactone. Renal function , creatinine 1.9--increase to 2. Will hold diuretics.  Continue with sodium bicarbonate tablets. Will give IV fluids for few hours.   2-Hyperkalemia; thought to be related to renal failure versus tumor lysis syndrome.  He received a dose of Kayexalate.  Resolved.   Acute hepatic encephalopathy;  More confuse and sleepy.  Ammonia elevated.  Will increase lactulose.    Probably tumor lysis syndrome; Patient received IV Rasburicase.  Uric acid has decreased to 2.5 from 6.  Case was discussed with oncologist.  Hypertension; coronary on hold due to soft systolic blood pressure.   Chronic diastolic heart failure;  Hold lasix.   Ascites, secondary to cirrhosis of the liver; He has require multiple paracentesis. Last paracentesis performed 03/04/2018.  Pleurx catheter planned for Monday. Will likely require platelet transfusion on Sunday. Had paracentesis 11-22, 3l removed   Diabetes; continue with Lantus, sliding scale insulin  Possible pneumonia; patient received 7 days course of antibiotics.  Antibiotics discontinued 03/04/2018.  Severe protein caloric malnutrition; Secondary to metastatic small cell lung cancer. Continue with supplements.  Anemia thrombocytopenia: Likely secondary to malignancy and chemotherapy. Continue to follow trend  Extensive stage small cell lung cancer with right upper lobe mass chronic back pain; Patient with very poor prognosis. Candidate for fetal chemotherapy. He was evaluated by [name] who is recommending hospice care. Plan to discharge home with hospice  Stage II Buttocks both sides. Present on admission. Local care. Pressure injury.   RN Pressure Injury Documentation: Pressure Injury 11/08/17 Stage II -  Partial thickness loss of dermis presenting as a shallow open ulcer with a red, pink wound bed without slough. two small .5 cm healing stage 2 on each side inner buttock (Active)  11/08/17 2200   Location: Buttocks  Location Orientation: Mid  Staging: Stage II -  Partial thickness loss of dermis presenting as a shallow open ulcer with a red, pink wound bed without slough.  Wound Description (Comments): two small .5 cm healing stage 2 on each side inner buttock  Present on Admission: Yes  Malnutrition Type:      Malnutrition Characteristics:      Nutrition  Interventions:     Estimated body mass index is 32.27 kg/m as calculated from the following:   Height as of this encounter: 6\' 3"  (1.905 m).   Weight as of this encounter: 117.1 kg.   DVT prophylaxis: ( SCDs Code Status: code Family Communication: no family at bedside Disposition Plan: to be determined, goals of care discussion will determine disposition   Consultants:   Oncology: Dr. Lorna Few 02/26/2018  Palliative care consultation Dr. Rowe Pavy 02/27/2018.   Procedures:  Ultrasound-guided paracentesis 02/26/2018----8.4 L removed/03/01/2018-7.8 L of yellow clear fluid removed  CT abdomen and pelvis 02/25/2018  Chest x-ray 02/25/2018  Ultrasound-guided paracentesis pending for 03/04/2018   Antimicrobials:   IV aztreonam 02/25/2018>>>>>> 02/28/2018  IV vancomycin 02/25/2018>>>>> 02/28/2018  IV Levaquin 02/28/2018>>>>>> 03/04/2018  Subjective: Sleepy, wake up , answer some questions.  Not eating a lot.  Objective: Vitals:   03/07/18 1625 03/07/18 2127 03/08/18 0534 03/08/18 1430  BP: 116/67 106/65 105/67 111/72  Pulse:  90 (!) 104 97  Resp:  16 16 18   Temp:  (!) 97.5 F (36.4 C) (!) 97.4 F (36.3 C) 97.6 F (36.4 C)  TempSrc:  Oral Oral Oral  SpO2:  95% 96% 100%  Weight:   117.1 kg   Height:        Intake/Output Summary (Last 24 hours) at 03/08/2018 1542 Last data filed at 03/08/2018 1300 Gross per 24 hour  Intake 70 ml  Output -  Net 70 ml   Filed Weights   03/06/18 0553 03/07/18 0623 03/08/18 0534  Weight: 121.1 kg 119.6 kg 117.1 kg    Examination:  General exam: No acute distress. Sleepy  Respiratory system; CTA Cardiovascular system: S 1, S 2 RRR Gastrointestinal system: Abdomen distended, mild tyender Central nervous system: sleepy, wake up answer few questions.  Extremities: chronic venous insufficiency skin changes, symmetric power     Data Reviewed: I have personally reviewed following labs and imaging  studies  CBC: Recent Labs  Lab 03/04/18 0551 03/05/18 1226 03/06/18 0547 03/07/18 0530 03/08/18 0836  WBC 6.2 7.5 8.1 7.8 7.8  NEUTROABS 4.6  --   --  6.0  --   HGB 9.1* 9.6* 9.7* 9.7* 9.2*  HCT 28.3* 29.4* 29.4* 29.0* 27.6*  MCV 109.3* 106.5* 107.3* 106.2* 104.9*  PLT 23* 20* 20* 17* 28*   Basic Metabolic Panel: Recent Labs  Lab 03/02/18 0527  03/04/18 0551 03/05/18 1226 03/06/18 0547 03/07/18 0530 03/08/18 0836  NA 139   < > 140 139 137 134* 135  K 3.8   < > 4.0 4.4 4.7 4.7 4.8  CL 110   < > 112* 111 108 107 106  CO2 21*   < > 18* 19* 19* 19* 18*  GLUCOSE 185*   < > 154* 198* 167* 167* 170*  BUN 42*   < > 41* 44* 48* 54* 61*  CREATININE 2.23*   < > 1.78* 1.83* 1.76* 1.98* 2.17*  CALCIUM 8.2*   < > 9.0 8.9 9.1 9.0 9.0  PHOS 4.1  --   --   --   --   --   --    < > = values in this interval not displayed.   GFR: Estimated Creatinine Clearance: 43.7 mL/min (A) (by C-G formula based on SCr of 2.17 mg/dL (H)). Liver Function Tests: Recent Labs  Lab 03/04/18 0551 03/05/18 1226 03/07/18 0530  AST 43* 42* 56*  ALT 24 27 31   ALKPHOS 91 105 130*  BILITOT 4.3* 5.0* 5.8*  PROT 6.0* 5.8* 5.8*  ALBUMIN 3.1* 2.8* 2.6*   No results for input(s): LIPASE, AMYLASE in the last 168 hours. Recent Labs  Lab 03/08/18 1141  AMMONIA 71*   Coagulation Profile: Recent Labs  Lab 03/07/18 0530  INR 1.41   Cardiac Enzymes: No results for input(s): CKTOTAL, CKMB, CKMBINDEX, TROPONINI in the last 168 hours. BNP (last 3 results) No results for input(s): PROBNP in the last 8760 hours. HbA1C: No results for input(s): HGBA1C in the last 72 hours. CBG: Recent Labs  Lab 03/07/18 1255 03/07/18 1822 03/07/18 2121 03/08/18 0747 03/08/18 1209  GLUCAP 153* 184* 143* 159* 175*   Lipid Profile: No results for input(s): CHOL, HDL, LDLCALC, TRIG, CHOLHDL, LDLDIRECT in the last 72 hours. Thyroid Function Tests: No results for input(s): TSH, T4TOTAL, FREET4, T3FREE, THYROIDAB in the  last 72 hours. Anemia Panel: No results for input(s): VITAMINB12, FOLATE, FERRITIN, TIBC, IRON, RETICCTPCT in the last 72 hours. Sepsis Labs: No results for input(s): PROCALCITON, LATICACIDVEN in the last 168 hours.  Recent Results (from the past 240 hour(s))  Culture, body fluid-bottle     Status: None   Collection Time: 03/01/18  1:45 PM  Result Value Ref Range Status   Specimen Description PERITONEAL  Final   Special Requests NONE  Final   Culture   Final    NO GROWTH 5 DAYS Performed at [facility], [address] [place], [place] [address]    Report Status 03/06/2018 FINAL  Final  Gram stain     Status: None   Collection Time: 03/01/18  1:45 PM  Result Value Ref Range Status   Specimen Description PERITONEAL  Final   Special Requests NONE  Final   Gram Stain   Final    FEW WBC PRESENT,BOTH PMN AND MONONUCLEAR NO ORGANISMS SEEN Performed at [facility], [address], [place], [place] [address]    Report Status 03/01/2018 FINAL  Final         Radiology Studies: US Paracentesis  Result Date: 03/07/2018 INDICATION: Patient with history of metastatic small cell lung cancer, recurrent ascites. Request made for therapeutic paracentesis. EXAM: ULTRASOUND GUIDED THERAPEUTIC PARACENTESIS MEDICATIONS: None COMPLICATIONS: None immediate. PROCEDURE: Informed written consent was obtained from the patient after a discussion of the risks, benefits and alternatives to treatment. A timeout was performed prior to the initiation of the procedure. Initial ultrasound scanning demonstrates a moderate to large amount of ascites within the left lower abdominal quadrant. The left lower abdomen was prepped and draped in the usual sterile fashion. 1% lidocaine was used for local anesthesia. Following this, a 19 gauge, 10-cm, Yueh catheter was introduced. An ultrasound image was saved for documentation purposes. The paracentesis was performed. The catheter was removed and a dressing  was applied. The patient tolerated the procedure well without immediate post procedural complication. FINDINGS: A total of approximately 3.1 liters of golden yellow fluid was removed. IMPRESSION: Successful ultrasound-guided therapeutic paracentesis yielding 3.1 liters of peritoneal fluid. Read by: Rowe Rondale, PA-C Electronically Signed   By: Aletta Edouard M.D.   On: 03/07/2018 16:36        Scheduled Meds: . insulin aspart  0-9 Units Subcutaneous TID WC  . lactulose  30 g Oral TID  . lidocaine  1 patch Transdermal Q24H  . omega-3 acid ethyl esters  1 g Oral QHS  . pantoprazole  40 mg Oral Daily  .  polyethylene glycol  17 g Oral Daily  . sodium bicarbonate  650 mg Oral BID  . vitamin B-12  100 mcg Oral Daily   Continuous Infusions: . [START ON 03/10/2018] clindamycin (CLEOCIN) IV       LOS: 11 days    Time spent: 35 minutes    Elmarie Shiley, MD [facility] Pager [phone]  If 7PM-7AM, please contact night-coverage [email or web address] Password [facility] 03/08/2018, 3:42 PM

## 2018-03-09 LAB — BASIC METABOLIC PANEL
Anion gap: 10 (ref 5–15)
BUN: 60 mg/dL — ABNORMAL HIGH (ref 8–23)
CALCIUM: 9 mg/dL (ref 8.9–10.3)
CO2: 18 mmol/L — ABNORMAL LOW (ref 22–32)
CREATININE: 2.12 mg/dL — AB (ref 0.61–1.24)
Chloride: 108 mmol/L (ref 98–111)
GFR calc Af Amer: 35 mL/min — ABNORMAL LOW (ref >60)
GFR, EST NON AFRICAN AMERICAN: 30 mL/min — AB (ref >60)
Glucose, Bld: 196 mg/dL — ABNORMAL HIGH (ref 70–99)
POTASSIUM: 4.7 mmol/L (ref 3.5–5.1)
SODIUM: 136 mmol/L (ref 135–145)

## 2018-03-09 LAB — CBC
HCT: 28.6 % — ABNORMAL LOW (ref 39.0–52.0)
Hemoglobin: 9.5 g/dL — ABNORMAL LOW (ref 13.0–17.0)
MCH: 35.6 pg — AB (ref 26.0–34.0)
MCHC: 33.2 g/dL (ref 30.0–36.0)
MCV: 107.1 fL — ABNORMAL HIGH (ref 80.0–100.0)
PLATELETS: 22 10*3/uL — AB (ref 150–400)
RBC: 2.67 MIL/uL — AB (ref 4.22–5.81)
RDW: 19.5 % — ABNORMAL HIGH (ref 11.5–15.5)
WBC: 10 10*3/uL (ref 4.0–10.5)
nRBC: 0.2 % (ref 0.0–0.2)

## 2018-03-09 LAB — TYPE AND SCREEN
ABO/RH(D): A POS
ANTIBODY SCREEN: NEGATIVE

## 2018-03-09 LAB — GLUCOSE, CAPILLARY
GLUCOSE-CAPILLARY: 181 mg/dL — AB (ref 70–99)
GLUCOSE-CAPILLARY: 186 mg/dL — AB (ref 70–99)
Glucose-Capillary: 155 mg/dL — ABNORMAL HIGH (ref 70–99)

## 2018-03-09 LAB — AMMONIA: Ammonia: 106 umol/L — ABNORMAL HIGH (ref 9–35)

## 2018-03-09 MED ORDER — LACTULOSE 10 GM/15ML PO SOLN
30.0000 g | Freq: Four times a day (QID) | ORAL | Status: DC
  Administered 2018-03-09 – 2018-03-10 (×4): 30 g via ORAL
  Filled 2018-03-09 (×4): qty 45

## 2018-03-09 MED ORDER — SODIUM CHLORIDE 0.9 % IV SOLN
INTRAVENOUS | Status: DC
  Administered 2018-03-09: 20:00:00 via INTRAVENOUS

## 2018-03-09 MED ORDER — SODIUM CHLORIDE 0.9 % IV SOLN: INTRAVENOUS | Status: DC

## 2018-03-09 MED ORDER — SODIUM CHLORIDE 0.9% IV SOLUTION
Freq: Once | INTRAVENOUS | Status: AC
  Administered 2018-03-09: 12:00:00 via INTRAVENOUS

## 2018-03-09 NOTE — Progress Notes (Signed)
PROGRESS NOTE    Robert Compton  GQB:[id number] DOB: [birth year] DOA: 02/25/2018 PCP: [name], MD   Brief Narrative: Patient is a 72 year old male history of extensive stage small cell lung cancer started on immunotherapy, history of hypertension, diabetes type 2 presented to the ED with increasing weakness and increasing abdominal distention.  Patient noted to be in acute renal failure, hyperkalemic with a potassium of 6, chest x-ray showing possible infiltrates.  Patient was also treated for pneumonia.  Patient admitted with acute renal failure, hyperkalemia and worsening ascites. He has been requiring intermittent paracentesis. Evaluated by oncology, [name] who is  Recommending hospice.  Patient met with palliative care, patient will be discharged home after  catheter placement for recurrent ascites.  He will be discharged home with hospice care.   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 Creatinine on admission was at 4, from prior at 1.2. Renal ultrasound negative for hydronephrosis. Hold Lasix and spironolactone due to worsening creatinine. Renal function , creatinine 1.9--increase to 2.  Creatinine remains at 2 today. Continue with sodium bicarbonate tablets.   2-Hyperkalemia; thought to be related to renal failure versus tumor lysis syndrome.  He received a dose of  Kayexalate.  Resolved.   Acute hepatic encephalopathy;  Patient was notice to be  more confuse and sleepy 11-23  Ammonia elevated. Increased  Lactulose today to every 6 hours.  Improved today.  Ammonia level elevated  Probably tumor lysis syndrome; Patient received IV Rasburicase.  Uric acid has decreased to 2.5 from 6.  Case was discussed with oncologist.  Hypertension; coronary on hold due to soft systolic blood pressure.   Chronic diastolic heart failure;  Hold lasix.   Ascites, secondary to cirrhosis of the liver; He has require multiple paracentesis. Last paracentesis performed 03/04/2018.  Pleurx catheter planned for Monday. Will likely require platelet transfusion on Sunday. Had paracentesis 11-22, 3l removed  We will transfuse 2 units of platelets for Pleurx catheter placement.  Diabetes; continue with Lantus, sliding scale insulin  Possible pneumonia; patient received 7 days course of antibiotics.  Antibiotics discontinued 03/04/2018.  Severe protein caloric malnutrition; Secondary to metastatic small cell lung cancer. Continue with supplements.  Anemia thrombocytopenia: Likely secondary to malignancy and chemotherapy. Continue to follow trend  Extensive stage small cell lung cancer with right upper lobe mass chronic back pain; Patient with very poor prognosis. Candidate for fetal chemotherapy. He was evaluated by [name] who is recommending hospice care. Plan to discharge home with hospice  Stage II Buttocks both sides. Present on admission. Local care. Pressure injury.   RN Pressure Injury Documentation: Pressure Injury 11/08/17 Stage II -  Partial thickness loss of dermis presenting as a shallow open ulcer with a red, pink wound bed without slough. two small .5 cm healing stage 2 on each side inner buttock (Active)  11/08/17 2200   Location: Buttocks  Location Orientation: Mid  Staging: Stage II -  Partial thickness loss of dermis presenting as a  shallow open ulcer with a red, pink wound bed without slough.  Wound Description (Comments): two small .5 cm healing stage 2 on each side inner buttock  Present on Admission: Yes    Malnutrition Type:      Malnutrition Characteristics:      Nutrition Interventions:     Estimated body mass index is 32.27 kg/m as calculated from the following:   Height as of this encounter: 6' 3"  (1.905 m).   Weight as of this encounter: 117.1 kg.   DVT prophylaxis: ( SCDs Code Status: code Family Communication: no family at bedside Disposition Plan: to be determined, goals of care discussion will determine disposition   Consultants:   Oncology: Dr. Lorna Few 02/26/2018  Palliative care consultation Dr. Rowe Pavy 02/27/2018.   Procedures:  Ultrasound-guided paracentesis 02/26/2018----8.4 L removed/03/01/2018-7.8 L of yellow clear fluid removed  CT abdomen and pelvis 02/25/2018  Chest x-ray 02/25/2018  Ultrasound-guided paracentesis pending for 03/04/2018   Antimicrobials:   IV aztreonam 02/25/2018>>>>>> 02/28/2018  IV vancomycin 02/25/2018>>>>> 02/28/2018  IV Levaquin 02/28/2018>>>>>> 03/04/2018  Subjective: He is more alert today, still confused. Objective: Vitals:   03/09/18 0456 03/09/18 1209 03/09/18 1239 03/09/18 1455  BP: 104/66 104/70 102/64 104/60  Pulse: 100 99 96 95  Resp: 18 18 18 20   Temp: 97.8 F (36.6 C) 97.8 F (36.6 C) (!) 97.3 F (36.3 C) (!) 97.5 F (36.4 C)  TempSrc: Oral Oral Oral Oral  SpO2: 95% 94% 93% 93%  Weight:      Height:        Intake/Output Summary (Last 24 hours) at 03/09/2018 1457 Last data filed at 03/09/2018 1440 Gross per 24 hour  Intake 1491.18 ml  Output -  Net 1491.18 ml   Filed Weights   03/06/18 0553 03/07/18 0623 03/08/18 0534  Weight: 121.1 kg 119.6 kg 117.1 kg    Examination:  General exam: More alert, not acute distress.  Chronic ill-appearing icteric. Respiratory system; creased breath sounds,  crackles at the bases. Cardiovascular system: 1 S2, regular rhythm rate Gastrointestinal system; bowel sounds present, soft abdomen distended. Central nervous system; alert, answers some questions, confused Extremities: chronic venous insufficiency skin changes, symmetric power     Data Reviewed: I have personally reviewed following labs and imaging studies  CBC: Recent Labs  Lab 03/04/18 0551 03/05/18 1226 03/06/18 0547 03/07/18 0530 03/08/18 0836 03/09/18 0516  WBC 6.2 7.5 8.1 7.8 7.8 10.0  NEUTROABS 4.6  --   --  6.0  --   --   HGB 9.1* 9.6* 9.7* 9.7* 9.2* 9.5*  HCT 28.3* 29.4* 29.4* 29.0* 27.6* 28.6*  MCV 109.3* 106.5* 107.3* 106.2* 104.9* 107.1*  PLT 23* 20* 20* 17* 28* 22*   Basic Metabolic Panel: Recent Labs  Lab 03/05/18 1226 03/06/18 0547 03/07/18 0530 03/08/18 0836 03/09/18 0516  NA 139 137 134* 135 136  K 4.4 4.7 4.7 4.8 4.7  CL 111 108 107 106 108  CO2 19* 19* 19* 18* 18*  GLUCOSE 198* 167* 167* 170* 196*  BUN 44* 48* 54* 61* 60*  CREATININE 1.83* 1.76* 1.98* 2.17* 2.12*  CALCIUM 8.9 9.1 9.0 9.0 9.0   GFR: Estimated Creatinine Clearance: 44.7 mL/min (A) (by C-G formula based on SCr of 2.12 mg/dL (H)). Liver Function Tests: Recent Labs  Lab 03/04/18 0551 03/05/18 1226 03/07/18 0530  AST 43* 42* 56*  ALT 24 27 31   ALKPHOS 91 105 130*  BILITOT 4.3* 5.0* 5.8*  PROT  6.0* 5.8* 5.8*  ALBUMIN 3.1* 2.8* 2.6*   No results for input(s): LIPASE, AMYLASE in the last 168 hours. Recent Labs  Lab 03/08/18 1141 03/09/18 1134  AMMONIA 71* 106*   Coagulation Profile: Recent Labs  Lab 03/07/18 0530  INR 1.41   Cardiac Enzymes: No results for input(s): CKTOTAL, CKMB, CKMBINDEX, TROPONINI in the last 168 hours. BNP (last 3 results) No results for input(s): PROBNP in the last 8760 hours. HbA1C: No results for input(s): HGBA1C in the last 72 hours. CBG: Recent Labs  Lab 03/08/18 1547 03/08/18 1725 03/08/18 2212 03/09/18 0743 03/09/18 1133    GLUCAP 227* 205* 191* 181* 155*   Lipid Profile: No results for input(s): CHOL, HDL, LDLCALC, TRIG, CHOLHDL, LDLDIRECT in the last 72 hours. Thyroid Function Tests: No results for input(s): TSH, T4TOTAL, FREET4, T3FREE, THYROIDAB in the last 72 hours. Anemia Panel: No results for input(s): VITAMINB12, FOLATE, FERRITIN, TIBC, IRON, RETICCTPCT in the last 72 hours. Sepsis Labs: No results for input(s): PROCALCITON, LATICACIDVEN in the last 168 hours.  Recent Results (from the past 240 hour(s))  Culture, body fluid-bottle     Status: None   Collection Time: 03/01/18  1:45 PM  Result Value Ref Range Status   Specimen Description PERITONEAL  Final   Special Requests NONE  Final   Culture   Final    NO GROWTH 5 DAYS Performed at [facility], [address], [place], [place] [address]    Report Status 03/06/2018 FINAL  Final  Gram stain     Status: None   Collection Time: 03/01/18  1:45 PM  Result Value Ref Range Status   Specimen Description PERITONEAL  Final   Special Requests NONE  Final   Gram Stain   Final    FEW WBC PRESENT,BOTH PMN AND MONONUCLEAR NO ORGANISMS SEEN Performed at [facility], [address], [place], [place] [address]    Report Status 03/01/2018 FINAL  Final         Radiology Studies: US Paracentesis  Result Date: 03/07/2018 INDICATION: Patient with history of metastatic small cell lung cancer, recurrent ascites. Request made for therapeutic paracentesis. EXAM: ULTRASOUND GUIDED THERAPEUTIC PARACENTESIS MEDICATIONS: None COMPLICATIONS: None immediate. PROCEDURE: Informed written consent was obtained from the patient after a discussion of the risks, benefits and alternatives to treatment. A timeout was performed prior to the initiation of the procedure. Initial ultrasound scanning demonstrates a moderate to large amount of ascites within the left lower abdominal quadrant. The left lower abdomen was prepped and draped in the usual sterile  fashion. 1% lidocaine was used for local anesthesia. Following this, a 19 gauge, 10-cm, Yueh catheter was introduced. An ultrasound image was saved for documentation purposes. The paracentesis was performed. The catheter was removed and a dressing was applied. The patient tolerated the procedure well without immediate post procedural complication. FINDINGS: A total of approximately 3.1 liters of golden yellow fluid was removed. IMPRESSION: Successful ultrasound-guided therapeutic paracentesis yielding 3.1 liters of peritoneal fluid. Read by: Rowe Naethan, PA-C Electronically Signed   By: Aletta Edouard M.D.   On: 03/07/2018 16:36        Scheduled Meds: . insulin aspart  0-9 Units Subcutaneous TID WC  . lactulose  30 g Oral TID  . lidocaine  1 patch Transdermal Q24H  . omega-3 acid ethyl esters  1 g Oral QHS  . pantoprazole  40 mg Oral Daily  . polyethylene glycol  17 g Oral Daily  . sodium bicarbonate  650 mg Oral BID  . vitamin B-12  100 mcg Oral Daily   Continuous Infusions: . [START ON 03/10/2018] clindamycin (CLEOCIN) IV       LOS: 12 days    Time spent: 35 minutes    Elmarie Shiley, MD [facility] Pager [phone]  If 7PM-7AM, please contact night-coverage [email or web address] Password [facility] 03/09/2018, 2:57 PM

## 2018-03-10 ENCOUNTER — Inpatient Hospital Stay (HOSPITAL_COMMUNITY): Payer: BLUE CROSS/BLUE SHIELD

## 2018-03-10 ENCOUNTER — Encounter (HOSPITAL_COMMUNITY): Payer: Self-pay | Admitting: Diagnostic Radiology

## 2018-03-10 HISTORY — PX: IR PERC TUN PERIT CATH WO PORT S&I /IMAG: IMG2327

## 2018-03-10 LAB — CBC WITH DIFFERENTIAL/PLATELET
ABS IMMATURE GRANULOCYTES: 0.25 10*3/uL — AB (ref 0.00–0.07)
BASOS PCT: 0 %
Basophils Absolute: 0 10*3/uL (ref 0.0–0.1)
Eosinophils Absolute: 0.1 10*3/uL (ref 0.0–0.5)
Eosinophils Relative: 1 %
HCT: 27.1 % — ABNORMAL LOW (ref 39.0–52.0)
Hemoglobin: 9 g/dL — ABNORMAL LOW (ref 13.0–17.0)
Immature Granulocytes: 3 %
Lymphocytes Relative: 10 %
Lymphs Abs: 0.9 10*3/uL (ref 0.7–4.0)
MCH: 35.3 pg — AB (ref 26.0–34.0)
MCHC: 33.2 g/dL (ref 30.0–36.0)
MCV: 106.3 fL — ABNORMAL HIGH (ref 80.0–100.0)
Monocytes Absolute: 0.8 10*3/uL (ref 0.1–1.0)
Monocytes Relative: 9 %
Neutro Abs: 7.3 10*3/uL (ref 1.7–7.7)
Neutrophils Relative %: 77 %
PLATELETS: 35 10*3/uL — AB (ref 150–400)
RBC: 2.55 MIL/uL — AB (ref 4.22–5.81)
RDW: 19.2 % — ABNORMAL HIGH (ref 11.5–15.5)
WBC: 9.4 10*3/uL (ref 4.0–10.5)
nRBC: 0.2 % (ref 0.0–0.2)

## 2018-03-10 LAB — COMPREHENSIVE METABOLIC PANEL
ALT: 48 U/L — AB (ref 0–44)
ANION GAP: 12 (ref 5–15)
AST: 88 U/L — ABNORMAL HIGH (ref 15–41)
Albumin: 2.7 g/dL — ABNORMAL LOW (ref 3.5–5.0)
Alkaline Phosphatase: 152 U/L — ABNORMAL HIGH (ref 38–126)
BUN: 55 mg/dL — AB (ref 8–23)
CALCIUM: 9 mg/dL (ref 8.9–10.3)
CHLORIDE: 107 mmol/L (ref 98–111)
CO2: 17 mmol/L — AB (ref 22–32)
CREATININE: 1.82 mg/dL — AB (ref 0.61–1.24)
GFR calc Af Amer: 42 mL/min — ABNORMAL LOW (ref >60)
GFR calc non Af Amer: 36 mL/min — ABNORMAL LOW (ref >60)
Glucose, Bld: 196 mg/dL — ABNORMAL HIGH (ref 70–99)
POTASSIUM: 4.6 mmol/L (ref 3.5–5.1)
SODIUM: 136 mmol/L (ref 135–145)
TOTAL PROTEIN: 6 g/dL — AB (ref 6.5–8.1)
Total Bilirubin: 8.3 mg/dL — ABNORMAL HIGH (ref 0.3–1.2)

## 2018-03-10 LAB — CBC
HEMATOCRIT: 27.6 % — AB (ref 39.0–52.0)
HEMOGLOBIN: 9.2 g/dL — AB (ref 13.0–17.0)
MCH: 35 pg — AB (ref 26.0–34.0)
MCHC: 33.3 g/dL (ref 30.0–36.0)
MCV: 104.9 fL — AB (ref 80.0–100.0)
Platelets: 28 10*3/uL — CL (ref 150–400)
RBC: 2.63 MIL/uL — ABNORMAL LOW (ref 4.22–5.81)
RDW: 19.1 % — ABNORMAL HIGH (ref 11.5–15.5)
WBC: 7.8 10*3/uL (ref 4.0–10.5)
nRBC: 0 % (ref 0.0–0.2)

## 2018-03-10 LAB — BPAM PLATELET PHERESIS
BLOOD PRODUCT EXPIRATION DATE: 201911242359
BLOOD PRODUCT EXPIRATION DATE: 201911252359
ISSUE DATE / TIME: 201911241219
ISSUE DATE / TIME: 201911241637
UNIT TYPE AND RH: 6200
Unit Type and Rh: 5100

## 2018-03-10 LAB — PREPARE PLATELET PHERESIS
UNIT DIVISION: 0
Unit division: 0

## 2018-03-10 LAB — GLUCOSE, CAPILLARY
GLUCOSE-CAPILLARY: 178 mg/dL — AB (ref 70–99)
GLUCOSE-CAPILLARY: 164 mg/dL — AB (ref 70–99)
Glucose-Capillary: 177 mg/dL — ABNORMAL HIGH (ref 70–99)
Glucose-Capillary: 220 mg/dL — ABNORMAL HIGH (ref 70–99)

## 2018-03-10 LAB — PROTIME-INR
INR: 1.3
PROTHROMBIN TIME: 16.1 s — AB (ref 11.4–15.2)

## 2018-03-10 MED ORDER — CLINDAMYCIN PHOSPHATE 900 MG/50ML IV SOLN
INTRAVENOUS | Status: AC
  Administered 2018-03-10: 900 mg via INTRAVENOUS
  Filled 2018-03-10: qty 50

## 2018-03-10 MED ORDER — OXYCODONE HCL 5 MG PO TABS: 5.0000 mg | ORAL_TABLET | ORAL | 0 refills | Status: AC | PRN

## 2018-03-10 MED ORDER — MIDAZOLAM HCL 2 MG/2ML IJ SOLN
INTRAMUSCULAR | Status: AC
  Filled 2018-03-10: qty 4

## 2018-03-10 MED ORDER — POLYETHYLENE GLYCOL 3350 17 G PO PACK: 17.0000 g | PACK | Freq: Every day | ORAL | 0 refills | Status: AC

## 2018-03-10 MED ORDER — LACTULOSE 10 GM/15ML PO SOLN: 30.0000 g | Freq: Four times a day (QID) | ORAL | 0 refills | Status: AC

## 2018-03-10 MED ORDER — MIDAZOLAM HCL 2 MG/2ML IJ SOLN
INTRAMUSCULAR | Status: AC | PRN
  Administered 2018-03-10: 1 mg via INTRAVENOUS

## 2018-03-10 MED ORDER — FENTANYL CITRATE (PF) 100 MCG/2ML IJ SOLN
INTRAMUSCULAR | Status: AC | PRN
  Administered 2018-03-10: 50 ug via INTRAVENOUS

## 2018-03-10 MED ORDER — SODIUM BICARBONATE 650 MG PO TABS: 650.0000 mg | ORAL_TABLET | Freq: Two times a day (BID) | ORAL | 0 refills | Status: AC

## 2018-03-10 MED ORDER — SODIUM CHLORIDE 0.9% IV SOLUTION
Freq: Once | INTRAVENOUS | Status: AC
  Administered 2018-03-10: 09:00:00 via INTRAVENOUS

## 2018-03-10 MED ORDER — LIDOCAINE HCL (PF) 1 % IJ SOLN
INTRAMUSCULAR | Status: AC | PRN
  Administered 2018-03-10: 10 mL

## 2018-03-10 MED ORDER — FENTANYL CITRATE (PF) 100 MCG/2ML IJ SOLN
INTRAMUSCULAR | Status: AC
  Filled 2018-03-10: qty 2

## 2018-03-10 MED ORDER — ONDANSETRON HCL 4 MG PO TABS: 4.0000 mg | ORAL_TABLET | Freq: Four times a day (QID) | ORAL | 0 refills | Status: AC | PRN

## 2018-03-10 NOTE — Discharge Summary (Signed)
Physician Discharge Summary  Mccoy Testa FWY:[id number] DOB: 01-30-1946 DOA: 02/25/2018  PCP: [name], MD  Admit date: 02/25/2018 Discharge date: 03/10/2018  Admitted From: Home  Disposition: Home   Recommendations for Outpatient Follow-up:  1. Patient will be discharge home with hospice.   Home Health: Hospice care.   Discharge Condition: Guarded, poor prognosis.  CODE STATUS: full code.  Diet recommendation; regular diet   Brief/Interim Summary: Brief Narrative: Patient is a 72 year old male history of extensive stage small cell lung cancer started on immunotherapy, history of hypertension, diabetes type 2 presented to the ED with increasing weakness and increasing abdominal distention. Patient noted to be in acute renal failure, hyperkalemic with a potassium of 6, chest x-ray showing possible infiltrates.  Patient was also treated for pneumonia.  Patient admitted with acute renal failure, hyperkalemia and worsening ascites. He has been requiring intermittent paracentesis. Evaluated by oncology, [name] who is  Recommending hospice.  Patient met with palliative care, patient will be discharged home after  catheter placement for recurrent ascites.  He will be discharged home with hospice care.   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 Creatinine on admission  was at 4, from prior at 1.2. Renal ultrasound negative for hydronephrosis. Hold Lasix and spironolactone due to worsening creatinine. Renal function , creatinine 1.9--increase to 2.  cr has decreased to 1.8. Will hold lasix at discharge.  Continue with sodium bicarbonate tablets.   2-Hyperkalemia; thought to be related to renal failure versus tumor lysis syndrome.  He received a dose of Kayexalate.  Resolved.   Acute hepatic encephalopathy;  Patient was notice to be  more confuse and sleepy 11-23  Ammonia elevated. Increased  Lactulose today to every 6 hours.  Continue with lactulose.   Probably tumor lysis syndrome; Patient received IV Rasburicase.  Uric acid has decreased to 2.5 from 6.  Case was discussed with oncologist.  Hypertension; coronary on hold due to soft systolic blood pressure.   Chronic diastolic heart failure;  Hold lasix due to worsening renal function.    Ascites, secondary to cirrhosis of the liver; He has require multiple paracentesis. Last paracentesis performed 03/04/2018.  Pleurx catheter planned for Monday. Will likely require platelet transfusion on Sunday. Had paracentesis 11-22, 3l removed  He has received 3 units of platelet for pleurex catheter placement.  Plan to discharge home today after procedure.  Worsening liver function.  Hyperbilirubinemia, related to liver metastasis. No ducts occlusion on prior CT scan.   Diabetes; continue with Lantus, sliding scale insulin  Possible pneumonia; patient received 7 days course of antibiotics.  Antibiotics discontinued 03/04/2018.  Severe protein caloric malnutrition; Secondary to metastatic small cell lung cancer. Continue with supplements.  Anemia thrombocytopenia: Likely secondary to malignancy and chemotherapy. Continue to follow trend  Extensive stage small cell lung cancer with right upper lobe mass chronic back pain; Patient with very poor prognosis. Candidate for fetal  chemotherapy. He was  evaluated by [name] who is recommending hospice care. Plan to discharge home with hospice  Stage II Buttocks both sides. Present on admission. Local care. Pressure injury.    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   Ascites   HCAP (healthcare-associated pneumonia)   Palliative care by specialist   Goals of care, counseling/discussion    Discharge Instructions  Discharge Instructions    Diet - low sodium heart healthy   Complete by:  As directed    Increase activity slowly   Complete by:  As directed      Allergies as of 03/10/2018      Reactions   Amoxicillin-pot Clavulanate Anaphylaxis   Has patient had a PCN reaction causing immediate rash, facial/tongue/throat swelling, SOB or lightheadedness with hypotension: Yes Has patient had a PCN reaction causing severe rash involving mucus membranes or skin necrosis: Yes Has patient had a PCN reaction that required hospitalization: Yes Has patient had a PCN reaction occurring within the last 10 years: Yes If all of the above answers are "NO", then may proceed with Cephalosporin use.   Nitrofurantoin Anaphylaxis      Medication List    STOP taking these medications   carvedilol 3.125 MG tablet Commonly known as:  COREG   CINNAMON PO   doxycycline 100 MG tablet Commonly known as:  VIBRA-TABS   fish oil-omega-3 fatty acids 1000 MG capsule   fluticasone 50 MCG/ACT nasal spray Commonly known as:  FLONASE   furosemide 20 MG tablet Commonly known as:  LASIX   HYDROcodone-acetaminophen 7.5-325 MG tablet Commonly  known as:  NORCO   insulin aspart 100 UNIT/ML injection Commonly known as:  novoLOG   insulin glargine 100 UNIT/ML injection Commonly known as:  LANTUS   loratadine 10 MG tablet Commonly known as:  CLARITIN   metFORMIN 1000 MG tablet Commonly known as:  GLUCOPHAGE   nicotine 21 mg/24hr patch Commonly known as:  NICODERM CQ - dosed in mg/24 hours   Potassium Chloride ER 20 MEQ Tbcr   PRESCRIPTION MEDICATION   Red Yeast Rice 600 MG Tabs   senna-docusate 8.6-50 MG tablet Commonly known as:  Senokot-S   traMADol 50 MG tablet Commonly known as:  ULTRAM     TAKE these medications   lactulose 10 GM/15ML solution Commonly known as:  CHRONULAC Take 45 mLs (30 g total) by mouth every 6 (six) hours.   omeprazole 20 MG capsule Commonly known as:  PRILOSEC Take 20 mg by mouth daily.   ondansetron 4 MG tablet Commonly known as:  ZOFRAN Take 1 tablet (4 mg total) by mouth every 6 (six) hours as needed for nausea.   oxyCODONE 5 MG immediate release tablet Commonly known as:  Oxy IR/ROXICODONE Take 1 tablet (5 mg total) by mouth every 4 (four) hours as needed for moderate pain.   polyethylene glycol packet Commonly known as:  MIRALAX / GLYCOLAX Take 17 g by mouth daily. Start taking on:  03/11/2018   sodium bicarbonate 650 MG tablet Take 1 tablet (650 mg total) by mouth 2 (two) times daily.   VITAMIN B-12 PO Take 1 tablet by mouth daily.  Follow-up Information    [facility] Follow up.   Why:  Home nurse/hospital bed/bedside table Contact information: [place] [phone]         Allergies  Allergen Reactions  . Amoxicillin-Pot Clavulanate Anaphylaxis    Has patient had a PCN reaction causing immediate rash, facial/tongue/throat swelling, SOB or lightheadedness with hypotension: Yes Has patient had a PCN reaction causing severe rash involving mucus membranes or skin necrosis: Yes Has  patient had a PCN reaction that required hospitalization: Yes Has patient had a PCN reaction occurring within the last 10 years: Yes If all of the above answers are "NO", then may proceed with Cephalosporin use.   . Nitrofurantoin Anaphylaxis    Consultations:  Oncology  Palliative care.    Procedures/Studies: Ct Abdomen Pelvis Wo Contrast  Result Date: 02/25/2018 CLINICAL DATA:  Generalized weakness. Back pain. Stage IV lung cancer, spread the liver. Receiving chemotherapy. EXAM: CT ABDOMEN AND PELVIS WITHOUT CONTRAST TECHNIQUE: Multidetector CT imaging of the abdomen and pelvis was performed following the standard protocol without IV contrast. COMPARISON:  February 14, 2018 FINDINGS: Lower chest: No suspicious findings in the lung bases. Hepatobiliary: The patient has known liver lesions are not as well seen without contrast. The largest is in the right hepatic lobe on series 8, image 27. Mildly nodular contour to the liver suggesting cirrhosis. The gallbladder is unremarkable. Pancreas: Unremarkable. No pancreatic ductal dilatation or surrounding inflammatory changes. Spleen: Normal in size without focal abnormality. Adrenals/Urinary Tract: Adrenal glands are normal. There is a cyst in the upper pole the left kidney, unchanged. The kidneys, ureters, and bladder are otherwise normal. Stomach/Bowel: The stomach and small bowel are normal. The colon is unremarkable. The appendix is not visualized but there is no secondary evidence of appendicitis. Vascular/Lymphatic: Atherosclerotic changes are seen in the nonaneurysmal aorta. No adenopathy. Reproductive: Prostate is unremarkable. Other: Marked ascites in the abdomen and pelvis is similar in the interval. No free air. Musculoskeletal: Sclerotic lesion in L2 on series 6, image 69, stable in the interval. Mild sclerosis along the superior endplate of L1 on image 69 as well. Sclerotic focus in T9 on image 69 also. IMPRESSION: 1. Diffuse ascites,  unchanged. 2. Cirrhosis. 3. Known lesions in the liver are more difficult to evaluate without contrast but not definitely changed. 4. Scattered sclerotic lesions in the lumbar and thoracic spine are nonspecific but may be metastatic given history of lung cancer. 5. Atherosclerotic changes in the nonaneurysmal aorta. Electronically Signed   By: Dorise Bullion III M.D   On: 02/25/2018 21:29   Dg Chest 2 View  Result Date: 02/25/2018 CLINICAL DATA:  Productive cough with fatigue and abdominal distention EXAM: CHEST - 2 VIEW COMPARISON:  02/14/2018 abdominal CT FINDINGS: Hazy opacity in the right mid lung not seen on chest CT 11/26/2017. Normal heart size and mediastinal contours. No effusion or pneumothorax. Low lung volumes. IMPRESSION: Summation shadows versus subtle opacity in the right mid lung. This could be a focus of infection or metastatic disease. Electronically Signed   By: Monte Fantasia M.D.   On: 02/25/2018 19:17   Ct Chest W Contrast  Result Date: 02/14/2018 CLINICAL DATA:  "extensive stage small cell lung cancer presented with right upper lobe lung mass in addition to pulmonary nodules, mediastinal lymphadenopathy and extensive liver metastasis in June 2019. EXAM: CT CHEST, ABDOMEN, AND PELVIS WITH CONTRAST TECHNIQUE: Multidetector CT imaging of the chest, abdomen and pelvis was performed following the  standard protocol during bolus administration of intravenous contrast. CONTRAST:  17m OMNIPAQUE IOHEXOL 300 MG/ML  SOLN COMPARISON:  CT abdomen 11/08/2017, chest CT 11/26/2017, PET-CT 10/21/2017 FINDINGS: CT CHEST FINDINGS Cardiovascular: Coronary artery calcification and aortic atherosclerotic calcification. Mediastinum/Nodes: No axillary or supraclavicular adenopathy. RIGHT lower paratracheal lymph node measuring 12 mm compares to 22 mm on comparison PET-CT scan. No new adenopathy. Lungs/Pleura: Linear thickening in the RIGHT upper lobe at site of prior nodule. No new nodularity Two nodules  along the lower RIGHT horizontal fissure (image 30/2) unchanged. Musculoskeletal: Diffuse smudgy sclerosis within the vertebral bodies. CT ABDOMEN AND PELVIS FINDINGS Hepatobiliary: Cluster low-density lesions in the posterior RIGHT hepatic lobe not changed. Several small lesions in the more central RIGHT hepatic lobe are more conspicuous than prior including 13 mm lesion on image 57/2. Several small superior nodules are similar. The liver has a nodular contour New ascites throughout the abdomen and pelvis.  Gallbladder normal Pancreas: Pancreas normal Spleen: Normal spleen Adrenals/urinary tract: Adrenal glands normal. No worrisome hepatic lesion. Fat containing lesion in the LEFT kidney consistent with angiomyolipoma. Ureters and bladder normal Stomach/Bowel: Stomach, small bowel, appendix, and cecum are normal. The colon and rectosigmoid colon are normal. Vascular/Lymphatic: Abdominal aorta is normal caliber with atherosclerotic calcification. There is no retroperitoneal or periportal lymphadenopathy. No pelvic lymphadenopathy. Reproductive: Prostate normal Other: Large volume intraperitoneal free fluid. Musculoskeletal: Diffuse smudgy sclerosis of the bones concerning for widespread skeletal metastasis. Increased sclerosis in the lumbar spine compared to comparison CT 11/08/2017. IMPRESSION: Chest Impression: 1. Linear thickening in the RIGHT upper lobe at site of prior nodular lesion. 2. Mild mediastinal adenopathy similar to most recent CT scan of 11/26/2017 and improved from PET-CT scan of 10/21/2017 Abdomen / Pelvis Impression: 1. Nodule liver consistent with cirrhosis. New extensive ASCITES in the abdomen pelvis. 2. Multiple low-density lesions in the posterior RIGHT hepatic lobe are unchanged. Several new lesions in the central liver concerning for metastatic deposits. Consider MRI with and without contrast for further evaluation. 3. Smudgy calcifications within the lumbar spine indeterminate but  concerning for metastatic skeletal disease. Consider MRI of the lumbar spine (FDG PET CT not advised on patient with Neulasta support). Electronically Signed   By: SSuzy BouchardM.D.   On: 02/14/2018 15:13   Ct Abdomen Pelvis W Contrast  Result Date: 02/14/2018 CLINICAL DATA:  "extensive stage small cell lung cancer presented with right upper lobe lung mass in addition to pulmonary nodules, mediastinal lymphadenopathy and extensive liver metastasis in June 2019. EXAM: CT CHEST, ABDOMEN, AND PELVIS WITH CONTRAST TECHNIQUE: Multidetector CT imaging of the chest, abdomen and pelvis was performed following the standard protocol during bolus administration of intravenous contrast. CONTRAST:  1039mOMNIPAQUE IOHEXOL 300 MG/ML  SOLN COMPARISON:  CT abdomen 11/08/2017, chest CT 11/26/2017, PET-CT 10/21/2017 FINDINGS: CT CHEST FINDINGS Cardiovascular: Coronary artery calcification and aortic atherosclerotic calcification. Mediastinum/Nodes: No axillary or supraclavicular adenopathy. RIGHT lower paratracheal lymph node measuring 12 mm compares to 22 mm on comparison PET-CT scan. No new adenopathy. Lungs/Pleura: Linear thickening in the RIGHT upper lobe at site of prior nodule. No new nodularity Two nodules along the lower RIGHT horizontal fissure (image 30/2) unchanged. Musculoskeletal: Diffuse smudgy sclerosis within the vertebral bodies. CT ABDOMEN AND PELVIS FINDINGS Hepatobiliary: Cluster low-density lesions in the posterior RIGHT hepatic lobe not changed. Several small lesions in the more central RIGHT hepatic lobe are more conspicuous than prior including 13 mm lesion on image 57/2. Several small superior nodules are similar. The liver has a nodular contour  New ascites throughout the abdomen and pelvis.  Gallbladder normal Pancreas: Pancreas normal Spleen: Normal spleen Adrenals/urinary tract: Adrenal glands normal. No worrisome hepatic lesion. Fat containing lesion in the LEFT kidney consistent with  angiomyolipoma. Ureters and bladder normal Stomach/Bowel: Stomach, small bowel, appendix, and cecum are normal. The colon and rectosigmoid colon are normal. Vascular/Lymphatic: Abdominal aorta is normal caliber with atherosclerotic calcification. There is no retroperitoneal or periportal lymphadenopathy. No pelvic lymphadenopathy. Reproductive: Prostate normal Other: Large volume intraperitoneal free fluid. Musculoskeletal: Diffuse smudgy sclerosis of the bones concerning for widespread skeletal metastasis. Increased sclerosis in the lumbar spine compared to comparison CT 11/08/2017. IMPRESSION: Chest Impression: 1. Linear thickening in the RIGHT upper lobe at site of prior nodular lesion. 2. Mild mediastinal adenopathy similar to most recent CT scan of 11/26/2017 and improved from PET-CT scan of 10/21/2017 Abdomen / Pelvis Impression: 1. Nodule liver consistent with cirrhosis. New extensive ASCITES in the abdomen pelvis. 2. Multiple low-density lesions in the posterior RIGHT hepatic lobe are unchanged. Several new lesions in the central liver concerning for metastatic deposits. Consider MRI with and without contrast for further evaluation. 3. Smudgy calcifications within the lumbar spine indeterminate but concerning for metastatic skeletal disease. Consider MRI of the lumbar spine (FDG PET CT not advised on patient with Neulasta support). Electronically Signed   By: Suzy Bouchard M.D.   On: 02/14/2018 15:13   US Renal  Result Date: 02/26/2018 CLINICAL DATA:  Acute renal failure EXAM: RENAL / URINARY TRACT ULTRASOUND COMPLETE COMPARISON:  CT abdomen pelvis 02/25/2018 FINDINGS: Right Kidney: Renal measurements: 11.3 x 4.7 x 6.2 cm = volume: 174 mL . Echogenicity within normal limits. No mass or hydronephrosis visualized. Left Kidney: Renal measurements: 11.6 x 5.7 x 6.5 cm = volume: 223 mL. Echogenicity within normal limits. No mass or hydronephrosis visualized. Left upper pole cyst 2.5 cm Bladder: Bladder  empty with Foley catheter.  Moderate ascites in the pelvis IMPRESSION: Sonographically normal kidneys. Moderate ascites. Electronically Signed   By: Franchot Gallo M.D.   On: 02/26/2018 11:20   US Paracentesis  Result Date: 03/07/2018 INDICATION: Patient with history of metastatic small cell lung cancer, recurrent ascites. Request made for therapeutic paracentesis. EXAM: ULTRASOUND GUIDED THERAPEUTIC PARACENTESIS MEDICATIONS: None COMPLICATIONS: None immediate. PROCEDURE: Informed written consent was obtained from the patient after a discussion of the risks, benefits and alternatives to treatment. A timeout was performed prior to the initiation of the procedure. Initial ultrasound scanning demonstrates a moderate to large amount of ascites within the left lower abdominal quadrant. The left lower abdomen was prepped and draped in the usual sterile fashion. 1% lidocaine was used for local anesthesia. Following this, a 19 gauge, 10-cm, Yueh catheter was introduced. An ultrasound image was saved for documentation purposes. The paracentesis was performed. The catheter was removed and a dressing was applied. The patient tolerated the procedure well without immediate post procedural complication. FINDINGS: A total of approximately 3.1 liters of golden yellow fluid was removed. IMPRESSION: Successful ultrasound-guided therapeutic paracentesis yielding 3.1 liters of peritoneal fluid. Read by: Rowe Shiloh, PA-C Electronically Signed   By: Aletta Edouard M.D.   On: 03/07/2018 16:36   US Paracentesis  Result Date: 03/04/2018 INDICATION: Patient with history of small cell lung cancer, recurrent ascites. Request made for therapeutic paracentesis. EXAM: ULTRASOUND GUIDED THERAPEUTIC PARACENTESIS MEDICATIONS: None COMPLICATIONS: None immediate. PROCEDURE: Informed written consent was obtained from the patient after a discussion of the risks, benefits and alternatives to treatment. A timeout was performed prior to the  initiation of the procedure. Initial ultrasound scanning demonstrates a large amount of ascites within the left lower abdominal quadrant. The left lower abdomen was prepped and draped in the usual sterile fashion. 1% lidocaine was used for local anesthesia. Following this, a 19 gauge, 7-cm, Yueh catheter was introduced. An ultrasound image was saved for documentation purposes. The paracentesis was performed. The catheter was removed and a dressing was applied. The patient tolerated the procedure well without immediate post procedural complication. FINDINGS: A total of approximately 7.8 liters of yellow fluid was removed. IMPRESSION: Successful ultrasound-guided therapeutic paracentesis yielding 7.8 liters of peritoneal fluid. Read by: Rowe Gino, PA-C Electronically Signed   By: Lucrezia Europe M.D.   On: 03/04/2018 17:16   US Paracentesis  Result Date: 03/01/2018 INDICATION: Patient with history of small cell lung cancer. Recurrent ascites. Request made for diagnostic and therapeutic paracentesis. EXAM: ULTRASOUND GUIDED LEFT LOWER QUADRANT PARACENTESIS MEDICATIONS: None. COMPLICATIONS: None immediate. PROCEDURE: Informed written consent was obtained from the patient after a discussion of the risks, benefits and alternatives to treatment. A timeout was performed prior to the initiation of the procedure. Initial ultrasound scanning demonstrates a large amount of ascites within the left lower abdominal quadrant. The left lower abdomen was prepped and draped in the usual sterile fashion. 1% lidocaine with epinephrine was used for local anesthesia. Following this, a 19 gauge, 10-cm, Yueh catheter was introduced. An ultrasound image was saved for documentation purposes. The paracentesis was performed. The catheter was removed and a dressing was applied. The patient tolerated the procedure well without immediate post procedural complication. FINDINGS: A total of approximately 7.8 L of clear yellow fluid was removed.  Samples were sent to the laboratory as requested by the clinical team. IMPRESSION: Successful ultrasound-guided paracentesis yielding 7.8 liters of peritoneal fluid. Read by: Ascencion Dike PA-C Electronically Signed   By: Markus Daft M.D.   On: 03/01/2018 13:55   US Paracentesis  Result Date: 02/26/2018 INDICATION: Patient with history of small cell lung cancer, recurrent ascites. Request made for diagnostic and therapeutic paracentesis. EXAM: ULTRASOUND GUIDED DIAGNOSTIC AND THERAPEUTIC PARACENTESIS MEDICATIONS: None COMPLICATIONS: None immediate. PROCEDURE: Informed written consent was obtained from the patient after a discussion of the risks, benefits and alternatives to treatment. A timeout was performed prior to the initiation of the procedure. Initial ultrasound scanning demonstrates a large amount of ascites within the left lower abdominal quadrant. The left lower abdomen was prepped and draped in the usual sterile fashion. 1% lidocaine was used for local anesthesia. Following this, a 19 gauge, 10-cm, Yueh catheter was introduced. An ultrasound image was saved for documentation purposes. The paracentesis was performed. The catheter was removed and a dressing was applied. The patient tolerated the procedure well without immediate post procedural complication. FINDINGS: A total of approximately 8.4 liters of amber/blood-tinged fluid was removed. Samples were sent to the laboratory as requested by the clinical team. IMPRESSION: Successful ultrasound-guided diagnostic and therapeutic paracentesis yielding 8.4 liters of peritoneal fluid. Read by: Rowe Wyman, PA-C Electronically Signed   By: Corrie Mckusick D.O.   On: 02/26/2018 15:47   US Paracentesis  Result Date: 02/21/2018 INDICATION: Patient with history of small cell lung cancer, recurrent ascites. Request made for diagnostic and therapeutic paracentesis. EXAM: ULTRASOUND GUIDED DIAGNOSTIC AND THERAPEUTIC PARACENTESIS MEDICATIONS: None COMPLICATIONS:  None immediate. PROCEDURE: Informed written consent was obtained from the patient after a discussion of the risks, benefits and alternatives to treatment. A timeout was performed prior to the initiation of the procedure. Initial ultrasound  scanning demonstrates a large amount of ascites within the left lower abdominal quadrant. The left lower abdomen was prepped and draped in the usual sterile fashion. 1% lidocaine was used for local anesthesia. Following this, a 19 gauge, 10-cm, Yueh catheter was introduced. An ultrasound image was saved for documentation purposes. The paracentesis was performed. The catheter was removed and a dressing was applied. The patient tolerated the procedure well without immediate post procedural complication. FINDINGS: A total of approximately 11.4 liters of yellow fluid was removed. Samples were sent to the laboratory as requested by the clinical team. IMPRESSION: Successful ultrasound-guided diagnostic and therapeutic paracentesis yielding 11.4 liters of peritoneal fluid. Read by: Rowe Karol, PA-C Electronically Signed   By: Corrie Mckusick D.O.   On: 02/21/2018 13:14       Subjective: Sleepy, got dilaudid for pain. He wake up, answer few questions. Confuse.   Discharge Exam: Vitals:   03/10/18 1330 03/10/18 1335  BP: 111/64 115/66  Pulse: (!) 103 (!) 103  Resp: 14 15  Temp:    SpO2: 94% 100%   Vitals:   03/10/18 1320 03/10/18 1325 03/10/18 1330 03/10/18 1335  BP: 122/68 122/72 111/64 115/66  Pulse: (!) 103 (!) 103 (!) 103 (!) 103  Resp: 17 14 14 15   Temp:      TempSrc:      SpO2: 96% 95% 94% 100%  Weight:      Height:        General: icteric, sleepy.  Cardiovascular: RRR, S1/S2 +, no rubs, no gallops Respiratory: crackles bases.  Abdominal: Soft, NT, ND, bowel sounds + distended.  Extremities: no edema, no cyanosis    The results of significant diagnostics from this hospitalization (including imaging, microbiology, ancillary and laboratory) are  listed below for reference.     Microbiology: Recent Results (from the past 240 hour(s))  Culture, body fluid-bottle     Status: None   Collection Time: 03/01/18  1:45 PM  Result Value Ref Range Status   Specimen Description PERITONEAL  Final   Special Requests NONE  Final   Culture   Final    NO GROWTH 5 DAYS Performed at [facility], [address], [place], [place] [address]    Report Status 03/06/2018 FINAL  Final  Gram stain     Status: None   Collection Time: 03/01/18  1:45 PM  Result Value Ref Range Status   Specimen Description PERITONEAL  Final   Special Requests NONE  Final   Gram Stain   Final    FEW WBC PRESENT,BOTH PMN AND MONONUCLEAR NO ORGANISMS SEEN Performed at [facility], [address] [place], [place] [address]    Report Status 03/01/2018 FINAL  Final     Labs: BNP (last 3 results) Recent Labs    12/13/17 2211 01/10/18 2256 02/25/18 1659  BNP 63.1 90.1 33.3   Basic Metabolic Panel: Recent Labs  Lab 03/06/18 0547 03/07/18 0530 03/08/18 0836 03/09/18 0516 03/10/18 0540  NA 137 134* 135 136 136  K 4.7 4.7 4.8 4.7 4.6  CL 108 107 106 108 107  CO2 19* 19* 18* 18* 17*  GLUCOSE 167* 167* 170* 196* 196*  BUN 48* 54* 61* 60* 55*  CREATININE 1.76* 1.98* 2.17* 2.12* 1.82*  CALCIUM 9.1 9.0 9.0 9.0 9.0   Liver Function Tests: Recent Labs  Lab 03/04/18 0551 03/05/18 1226 03/07/18 0530 03/10/18 0540  AST 43* 42* 56* 88*  ALT 24 27 31  48*  ALKPHOS 91 105  130* 152*  BILITOT 4.3* 5.0* 5.8* 8.3*  PROT 6.0* 5.8* 5.8* 6.0*  ALBUMIN 3.1* 2.8* 2.6* 2.7*   No results for input(s): LIPASE, AMYLASE in the last 168 hours. Recent Labs  Lab 03/08/18 1141 03/09/18 1134  AMMONIA 71* 106*   CBC: Recent Labs  Lab 03/04/18 0551  03/06/18 0547 03/07/18 0530 03/08/18 0836 03/09/18 0516 03/10/18 0540  WBC 6.2   < > 8.1 7.8 7.8 10.0 9.4  NEUTROABS 4.6  --   --  6.0  --   --  7.3  HGB 9.1*   < > 9.7* 9.7* 9.2* 9.5* 9.0*  HCT 28.3*    < > 29.4* 29.0* 27.6* 28.6* 27.1*  MCV 109.3*   < > 107.3* 106.2* 104.9* 107.1* 106.3*  PLT 23*   < > 20* 17* 28* 22* 35*   < > = values in this interval not displayed.   Cardiac Enzymes: No results for input(s): CKTOTAL, CKMB, CKMBINDEX, TROPONINI in the last 168 hours. BNP: Invalid input(s): POCBNP CBG: Recent Labs  Lab 03/09/18 1133 03/09/18 1624 03/09/18 2130 03/10/18 0745 03/10/18 1108  GLUCAP 155* 186* 178* 164* 177*   D-Dimer No results for input(s): DDIMER in the last 72 hours. Hgb A1c No results for input(s): HGBA1C in the last 72 hours. Lipid Profile No results for input(s): CHOL, HDL, LDLCALC, TRIG, CHOLHDL, LDLDIRECT in the last 72 hours. Thyroid function studies No results for input(s): TSH, T4TOTAL, T3FREE, THYROIDAB in the last 72 hours.  Invalid input(s): FREET3 Anemia work up No results for input(s): VITAMINB12, FOLATE, FERRITIN, TIBC, IRON, RETICCTPCT in the last 72 hours. Urinalysis    Component Value Date/Time   COLORURINE AMBER (A) 02/25/2018 1834   APPEARANCEUR CLEAR 02/25/2018 1834   LABSPEC 1.015 02/25/2018 1834   PHURINE 5.0 02/25/2018 1834   GLUCOSEU NEGATIVE 02/25/2018 1834   HGBUR NEGATIVE 02/25/2018 1834   BILIRUBINUR NEGATIVE 02/25/2018 1834   KETONESUR NEGATIVE 02/25/2018 1834   PROTEINUR NEGATIVE 02/25/2018 1834   NITRITE NEGATIVE 02/25/2018 1834   LEUKOCYTESUR NEGATIVE 02/25/2018 1834   Sepsis Labs Invalid input(s): PROCALCITONIN,  WBC,  LACTICIDVEN Microbiology Recent Results (from the past 240 hour(s))  Culture, body fluid-bottle     Status: None   Collection Time: 03/01/18  1:45 PM  Result Value Ref Range Status   Specimen Description PERITONEAL  Final   Special Requests NONE  Final   Culture   Final    NO GROWTH 5 DAYS Performed at [facility], [address] [place], [place] [address]    Report Status 03/06/2018 FINAL  Final  Gram stain     Status: None   Collection Time: 03/01/18  1:45 PM  Result Value Ref  Range Status   Specimen Description PERITONEAL  Final   Special Requests NONE  Final   Gram Stain   Final    FEW WBC PRESENT,BOTH PMN AND MONONUCLEAR NO ORGANISMS SEEN Performed at [facility], [address], [place],  [address]    Report Status 03/01/2018 FINAL  Final     Time coordinating discharge: 35 minutes.   SIGNED:   Elmarie Shiley, MD  [facility] 03/10/2018, 1:42 PM Pager   If 7PM-7AM, please contact night-coverage [email or web address] Password [email or web address]

## 2018-03-10 NOTE — Procedures (Signed)
  Pre-operative Diagnosis: Metastatic small cell with recurrent ascites       Post-operative Diagnosis: Metastatic small cell with recurrent ascites   Indications: Palliative therapy for ascites  Procedure: Placement of tunneled peritoneal catheter   Findings: Large volume ascites.  Placed 15.5 Fr Pleurx on left side of abdomen.  Removed amber colored ascites (still draining and see final report for volume).  Complications: None     EBL: Minimal  Plan: Return to floor after fluid has been removed.  Recommend starting with daily drainage.

## 2018-03-10 NOTE — Care Management Note (Signed)
Case Management Note  Patient Details  Name: Robert Compton MRN: [id number] Date of Birth: October 17, 1945  Subjective/Objective:  Spoke to patient/dtr-Robert Compton in rm about d/c plan today home w/hospice-[facility]-liason Robert Compton talking to patient in rm-[facility] will manage DME for home. Patient is a full code,will need hospital bed,bedside table. PTAR for ambulance transp-confirmed address. Will keep his pcp-Dr. Dagmar Hait, Scheduled for a pleurx cath drain today. Nurse notified of box of pleurx cath to go home with patient today. Hospital bed must arrive in home prior d/c-Nurse aware.PTAR forms in shadow chart. Dtr Robert Compton is the primary contact-[phone].No further CM needs.                  Action/Plan:d/c home w/[facility]/DME   Expected Discharge Date:  (unknown)               Expected Discharge Plan:  Home w Hospice Care  In-House Referral:  Clinical Social Work  Discharge planning Services  CM Consult  Post Acute Care Choice:    Choice offered to:  Patient  DME Arranged:    DME Agency:     HH Arranged:  RN [place] Agency:  [facility] [place]  Status of Service:  Completed, signed off  If discussed at [employer], dates discussed:    Additional Comments:  Dessa Phi, RN 03/10/2018, 10:33 AM

## 2018-03-10 NOTE — Progress Notes (Signed)
[facility])  Update:  Pt is eligible for hospice services at home after discharged.  Will continue to follow up with family and make preparations for d/c once ready.   Thank you, Venia Carbon BSN, [facility] Liaison (listed in [place]) [phone]

## 2018-03-10 NOTE — Progress Notes (Signed)
[facility])  DME is set to be delivered around 4pm per daughter.  Venia Carbon BSN, RN [facility] Liaison (listed in [place])

## 2018-03-10 NOTE — Progress Notes (Signed)
Patient returned from IR, no change from am assessment. Plearx dressing site CDI, VS stable

## 2018-03-10 NOTE — Progress Notes (Signed)
[facility])   Follow up with family to further explain services and answer questions.  Plan will be to go home later today after pleurex drain placement, via PTAR.  DME needs discussed and pt currently has:  Bed, walker, wheelchair and BSC from [facility]. Pt needs:  New bed with air overlay, bedside table, suction--all have been sent to DME specialist with [facility] to arrange for delivery ASAP.  [name] (dtr) is the contact for delivery-number is correct on facesheet, as is home address.  Please send prescriptions for discharge comfort medications.  Please send signed and completed DNR form home with patient.  [facility] referral center is aware of the above information.  Completed discharge summary will need to be faxed to [facility] at [phone] when final.  Please notify [facility] when pt is ready to leave the unit at discharge [phone] M-F 830-5, after 5p please call [phone].   Above information shared with [facility].  Please call with any hospice related questions or concerns.  Thank you, Venia Carbon BSN, [facility] Liaison [phone]

## 2018-03-10 NOTE — Progress Notes (Signed)
PT Cancellation Note  Patient Details Name: Deantae Shackleton MRN: [id number] DOB: March 19, 1946   Cancelled Treatment:    Reason Eval/Treat Not Completed: Per chart, pt is set to d/c home with hospice later today after pleurx catheter placement. Will hold PT today. Will f/u should pt remain in hospital after today.    Weston Anna, PT Acute Rehabilitation Services Pager: [phone] Office: [phone]

## 2018-03-11 LAB — BPAM PLATELET PHERESIS
Blood Product Expiration Date: 201911262359
ISSUE DATE / TIME: 201911250823
UNIT TYPE AND RH: 600

## 2018-03-11 LAB — PREPARE PLATELET PHERESIS: UNIT DIVISION: 0

## 2018-03-14 ENCOUNTER — Telehealth: Payer: Self-pay | Admitting: Medical Oncology

## 2018-03-14 LAB — ACID FAST CULTURE WITH REFLEXED SENSITIVITIES: ACID FAST CULTURE - AFSCU3: NEGATIVE

## 2018-03-14 NOTE — Telephone Encounter (Signed)
Pt at home now with Hospice and peritoneal drain and seems to be doing well. He is eating and has a hospital bed and comfortable. [name] has a good support system.

## 2018-03-17 ENCOUNTER — Inpatient Hospital Stay: Payer: BLUE CROSS/BLUE SHIELD | Admitting: Oncology

## 2018-03-17 ENCOUNTER — Inpatient Hospital Stay: Payer: BLUE CROSS/BLUE SHIELD

## 2018-04-07 ENCOUNTER — Other Ambulatory Visit: Payer: BLUE CROSS/BLUE SHIELD

## 2018-04-07 ENCOUNTER — Ambulatory Visit: Payer: BLUE CROSS/BLUE SHIELD

## 2018-04-07 ENCOUNTER — Ambulatory Visit: Payer: BLUE CROSS/BLUE SHIELD | Admitting: Oncology

## 2018-04-16 DEATH — deceased

## 2019-05-01 IMAGING — CT CT CHEST W/ CM
2 of 3 series · 14 of 36 positions shown, 17 images · IV contrast (omnipaque)
Comparison: CT 09/23/2016 and PET-CT 10/21/2017

CLINICAL DATA: Small cell lung cancer with right upper lobe mass,
pulmonary nodules, mediastinal adenopathy and liver metastases
diagnosed September 2017.

EXAM:
CT CHEST WITH CONTRAST
TECHNIQUE: Multidetector CT imaging of the chest was performed during
intravenous contrast administration.
CONTRAST:  75mL OMNIPAQUE IOHEXOL 300 MG/ML  SOLN

[Series 2: axial st · axial · 0.93mm/px · z∈[-378,-92]mm · 11 of 169 slices shown, 14 images]
[im 13/169  mediastinal]
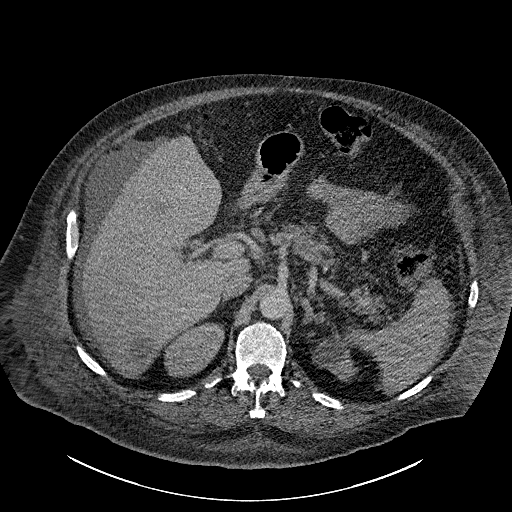
[im 13/169  lung]
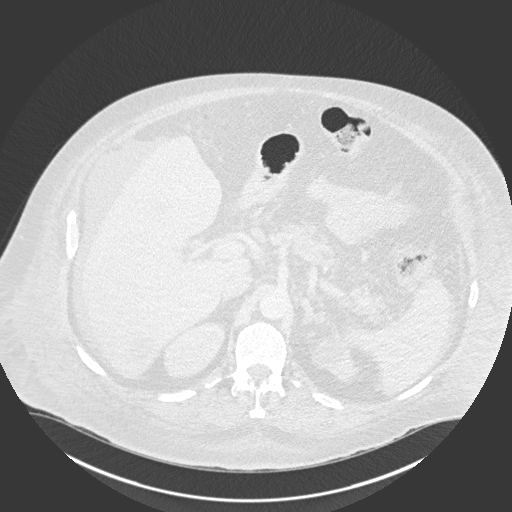
[im 25/169  lung]
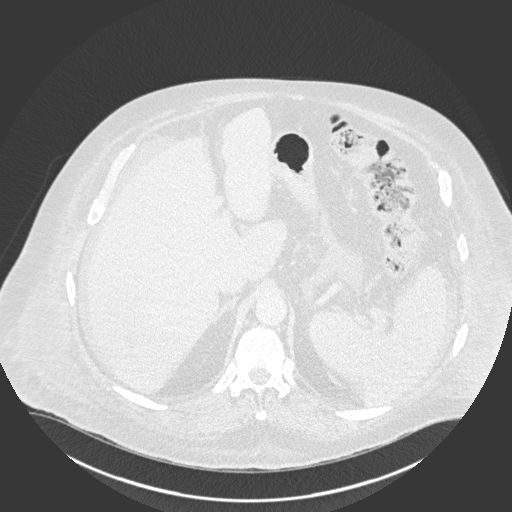
[im 38/169  lung]
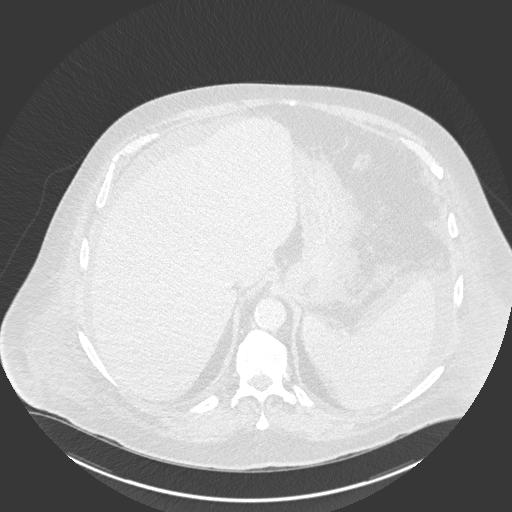
[im 57/169  lung]
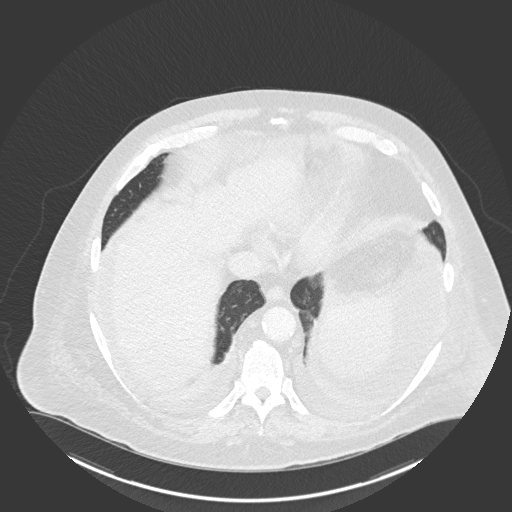
[im 69/169  mediastinal]
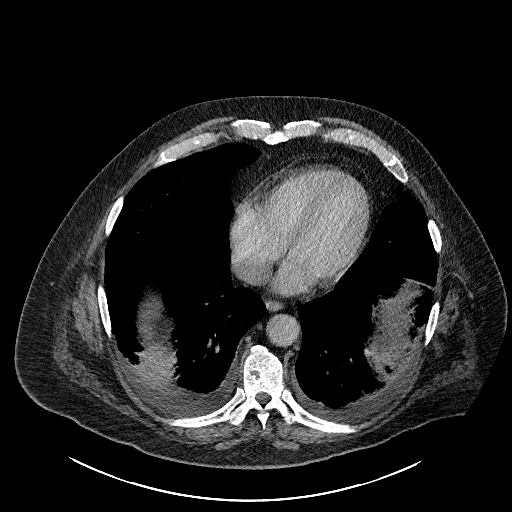
[im 69/169  lung]
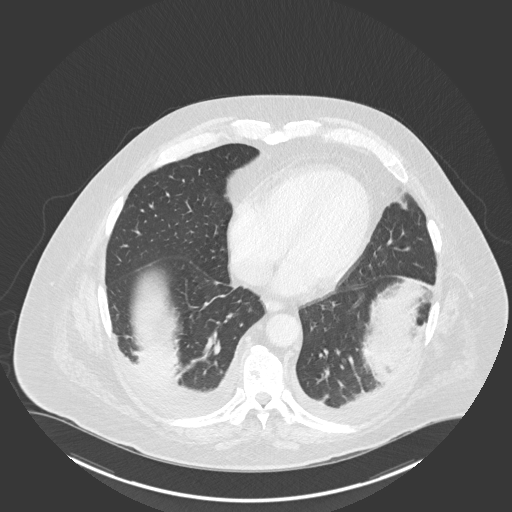
[im 88/169  lung]
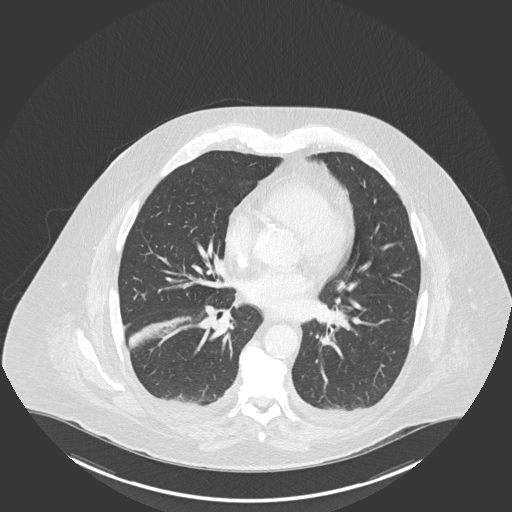
[im 100/169  lung]
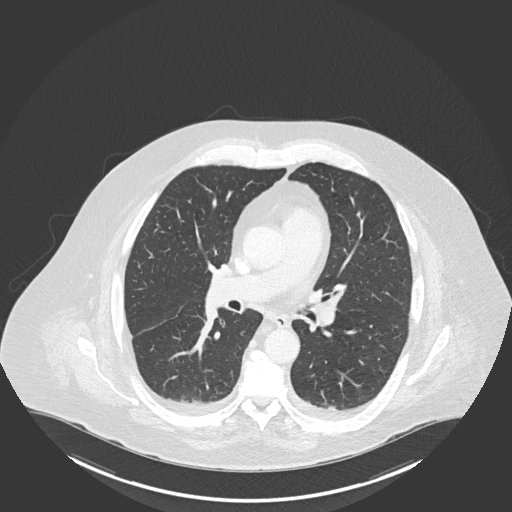
[im 113/169  lung]
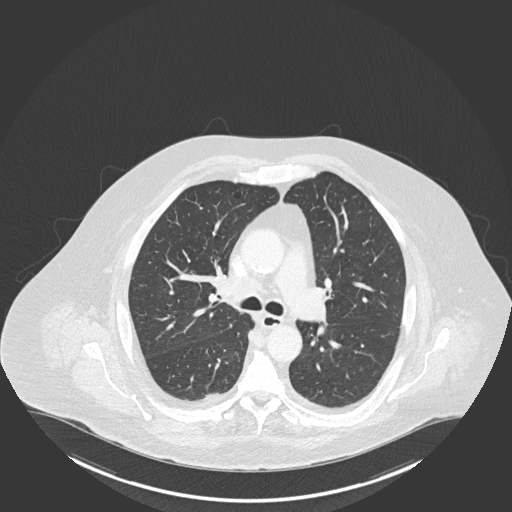
[im 131/169  mediastinal]
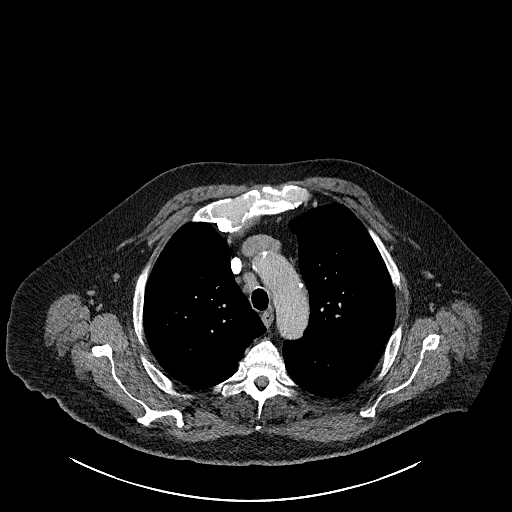
[im 131/169  lung]
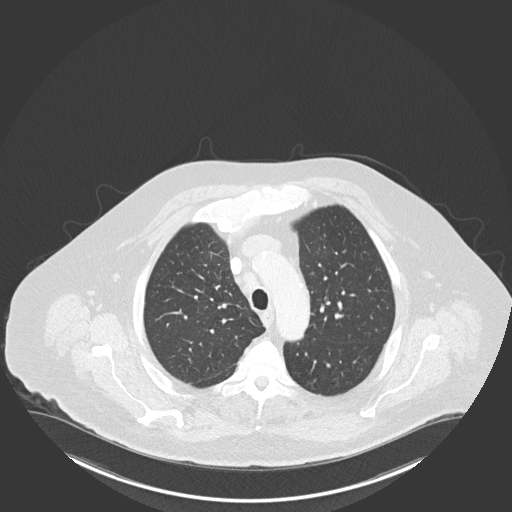
[im 144/169  lung]
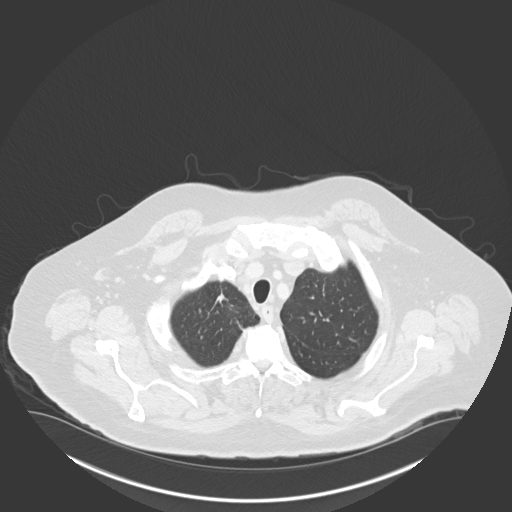
[im 156/169  lung]
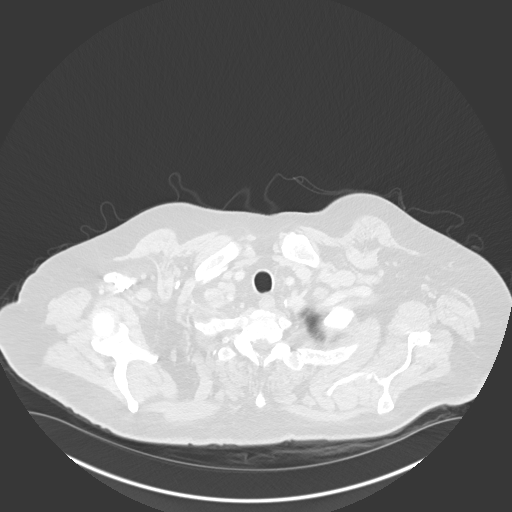

[Series 6: coronal · coronal · 0.71mm/px · 3 of 168 slices shown]
[im 34/168  lung]
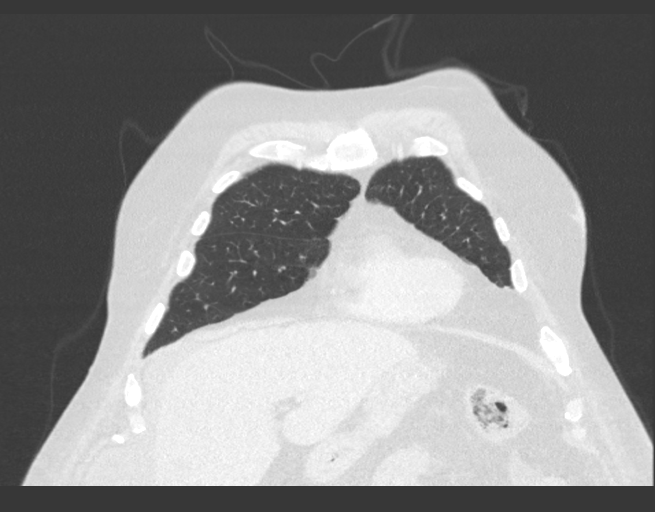
[im 67/168  lung]
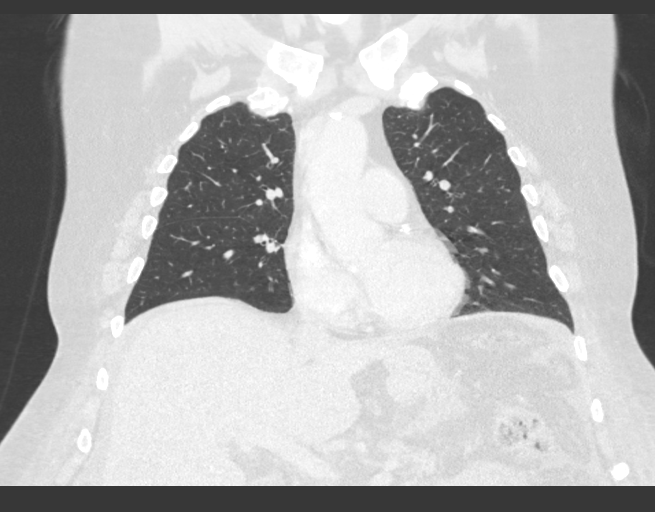
[im 101/168  lung]
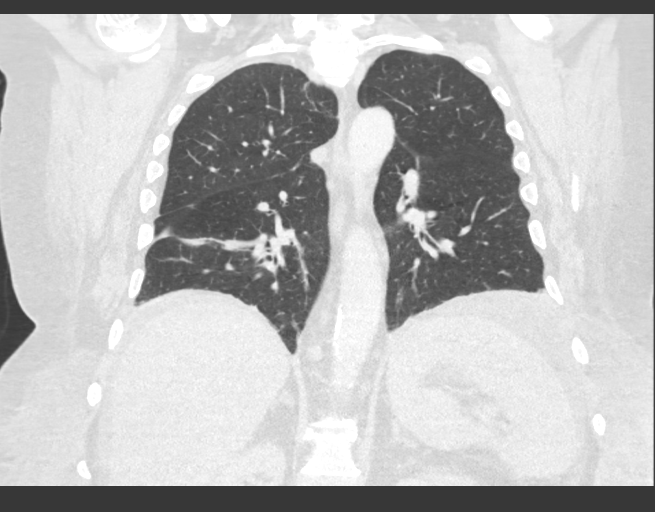

[14 of 36 positions shown; findings below may reference images not displayed]

FINDINGS: Cardiovascular: Heart size is normal. Calcified plaque over the left
main and 3 vessel coronary arteries. Calcified plaque over the
thoracic aorta. Remaining vascular structures are unremarkable.

Mediastinum/Nodes: 1.2 cm high pretracheal lymph node unchanged.
cm right paratracheal lymph node slightly smaller compared to September 2017 and stable compared to the recent CT from 10/21/2017. [DATE] mm
right periaortic lymph node slightly smaller. No significant hilar
adenopathy. Subcentimeter right pericardiophrenic lymph node
unchanged. Remaining mediastinal structures are unremarkable.

Lungs/Pleura: Lungs are adequately inflated and demonstrate 8 mm
(previously 1.1 cm) irregular nodule over the right apex which
measures approximately 3 cm craniocaudal dimension which is
unchanged. Slight decrease in size of 2 subcentimeter nodules
adjacent the minor fissure measuring 5-6 mm each. Stable peripheral
subcentimeter nodule over the right middle lobe. No new nodules
identified. New small bilateral pleural effusions with associated
bibasilar atelectasis. Minimal linear density over the left mainstem
bronchus. Airways are otherwise unremarkable.

Upper Abdomen: Several liver hypodensities most prominent over the
posterior right lobe without significant change compatible with
known liver metastases. Stable cyst over the upper pole left kidney.
Mild ascites which is new. Mild subcutaneous edema.

Musculoskeletal: Degenerative change of the spine. Subcentimeter
lucency along the left posterior aspect of an upper thoracic
vertebral body as well as subcentimeter lucency over a lower
thoracic vertebral body unchanged and may represent small
hemangiomas versus metastatic disease.
IMPRESSION: Known lung cancer over the right apex with stable 3 cm craniocaudal
dimension and slight decreased transverse dimension of 8 mm
(previously 1.1 cm). Slight interval decrease in size of 2
subcentimeter nodules adjacent the minor fissure. Stable
subcentimeter peripheral nodule right middle lobe. Stable
mediastinal adenopathy compared to 10/21/2017. No hilar adenopathy.
New small bilateral pleural effusions with associated bibasilar
atelectasis.

Stable known liver metastases worse over the posterior right lobe.
New mild ascites.

Two subcentimeter lucencies as described over and upper and lower
thoracic vertebral body unchanged and which may be small
hemangiomas, although metastatic disease is possible.

Stable left renal cyst.

Left main and 3 vessel atherosclerotic coronary artery disease.

Aortic Atherosclerosis ([id number]).

## 2019-06-04 IMAGING — US US ABDOMEN LIMITED
1 series · 9 of 9 positions shown · non-contrast
Comparison: Prior abdominal ultrasound 12/14/2017

CLINICAL DATA: 72-year-old male with abdominal distension, possible
ascites

EXAM:
LIMITED ABDOMEN ULTRASOUND FOR ASCITES
TECHNIQUE: Limited ultrasound survey for ascites was performed in all four
abdominal quadrants.

[Series 1: us abdomen limited · 0.33mm/px · 9 of 9 slices shown]
[im 1/9]
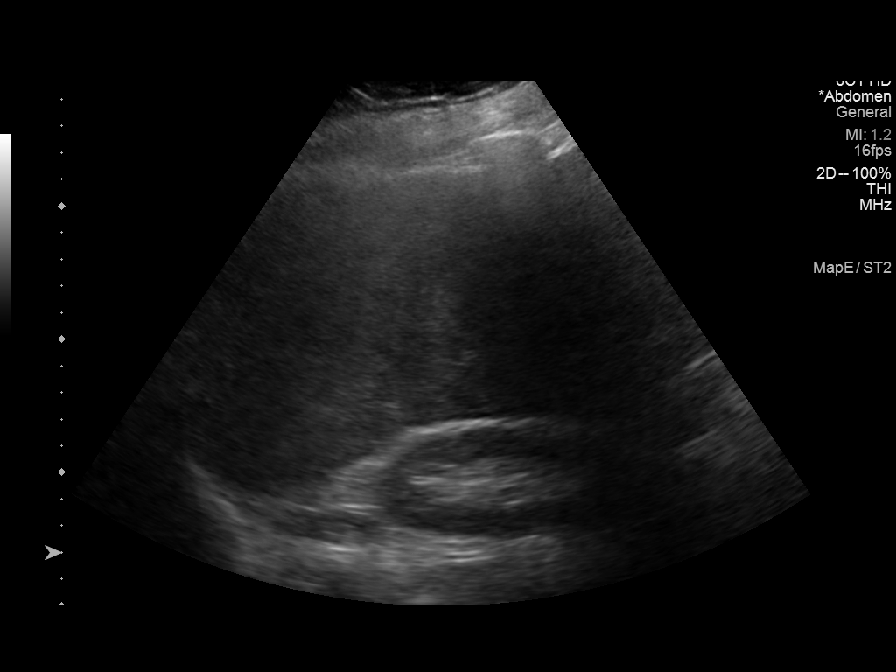
[im 2/9]
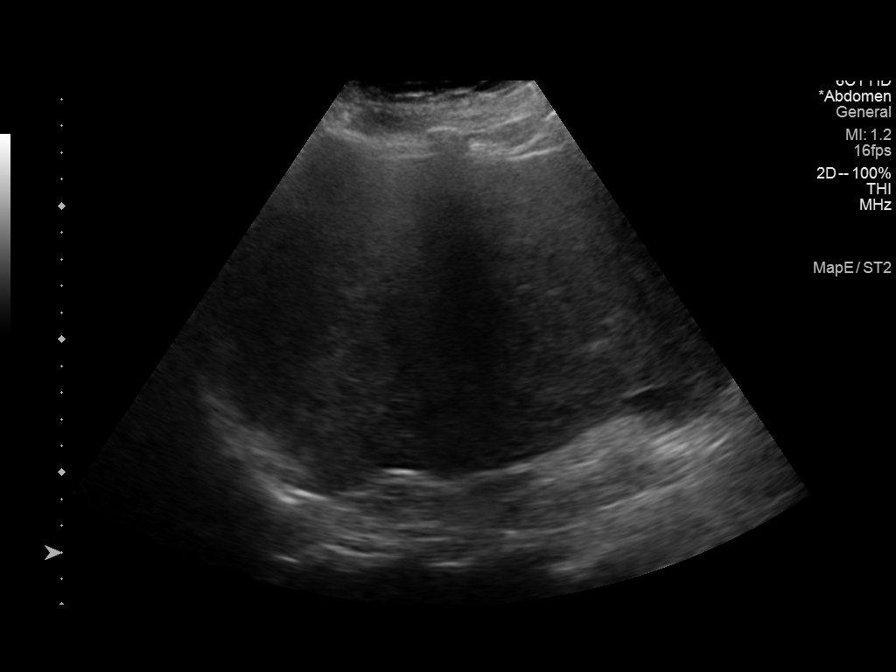
[im 3/9]
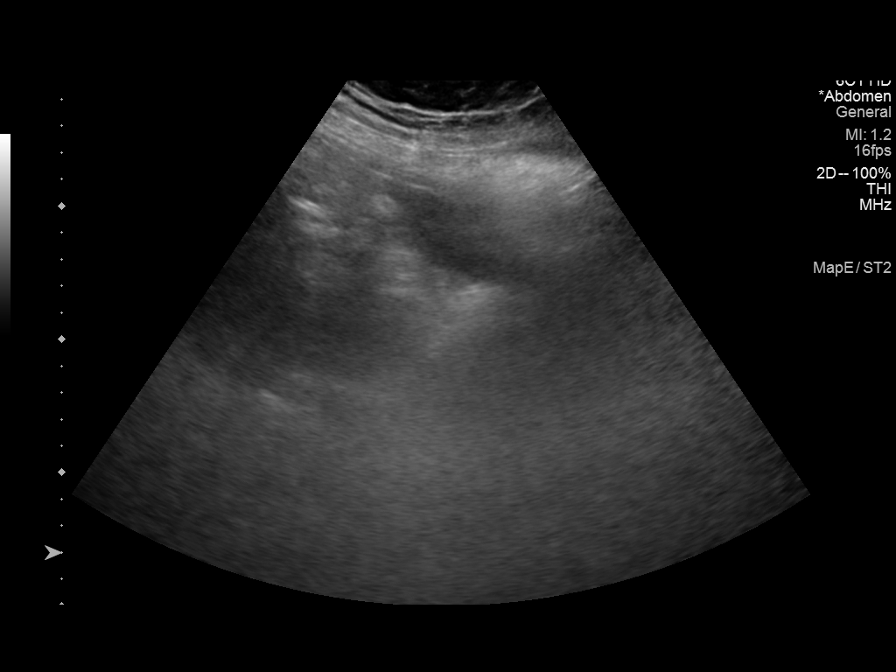
[im 4/9]
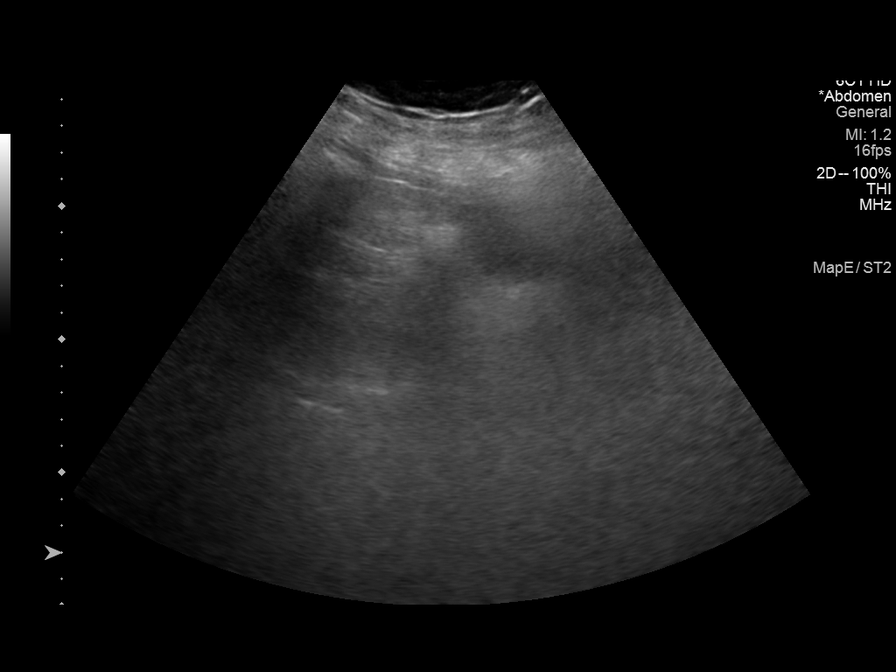
[im 5/9]
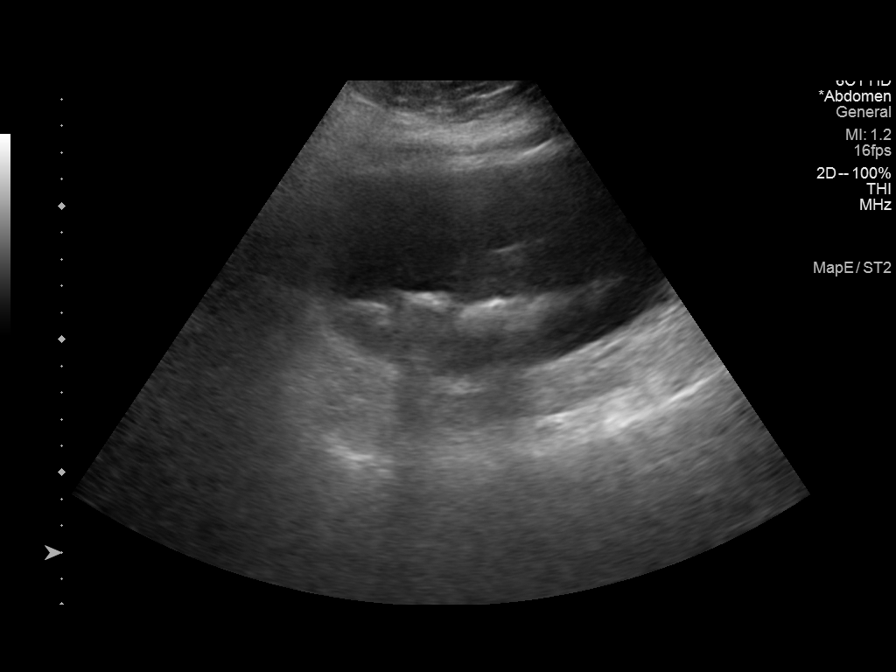
[im 6/9]
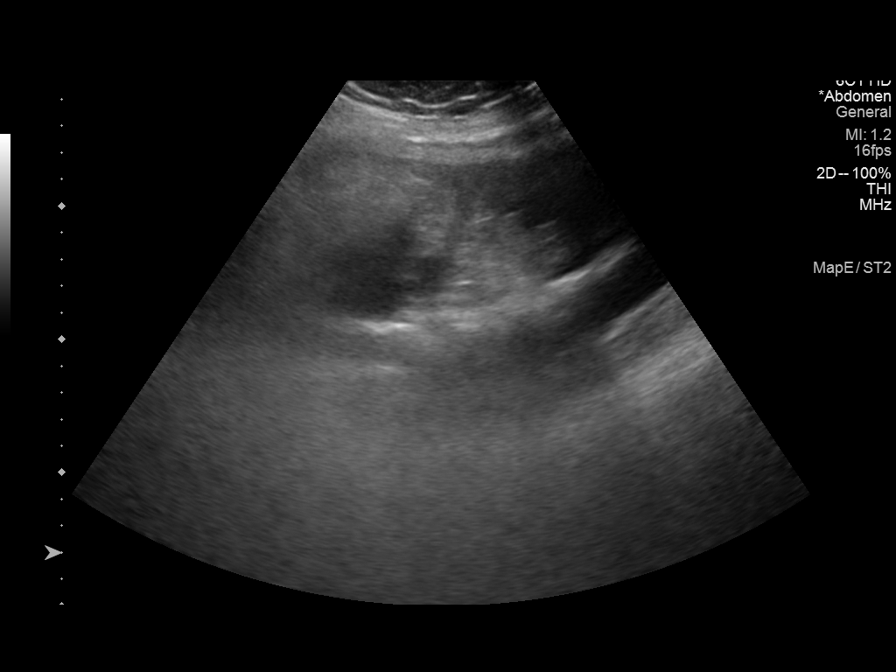
[im 7/9]
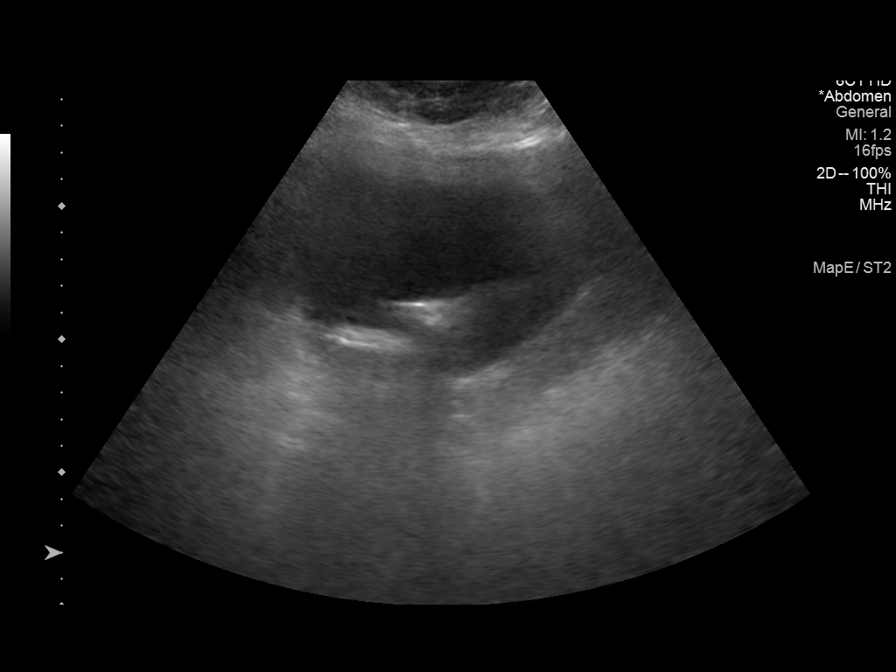
[im 8/9]
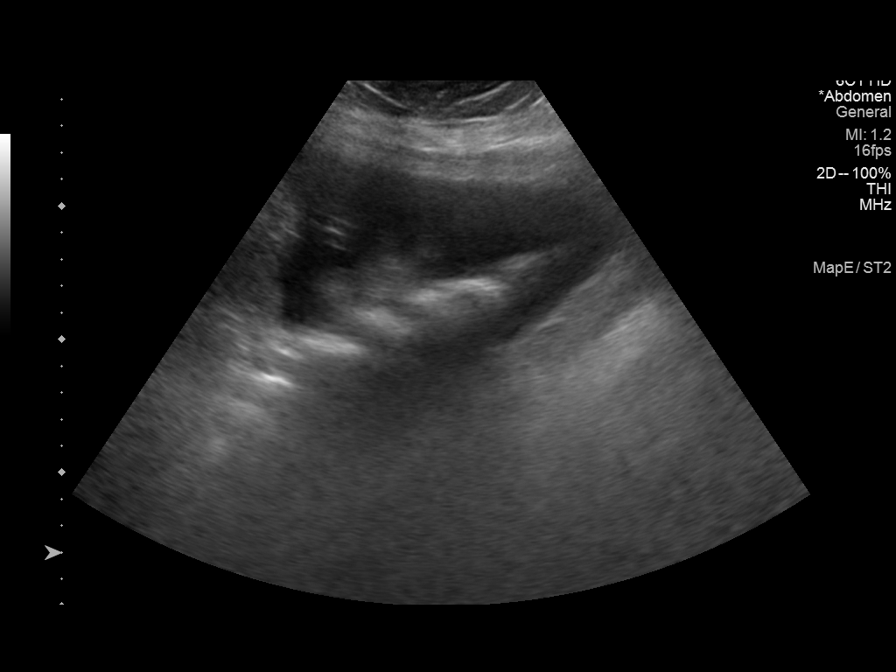
[im 9/9]
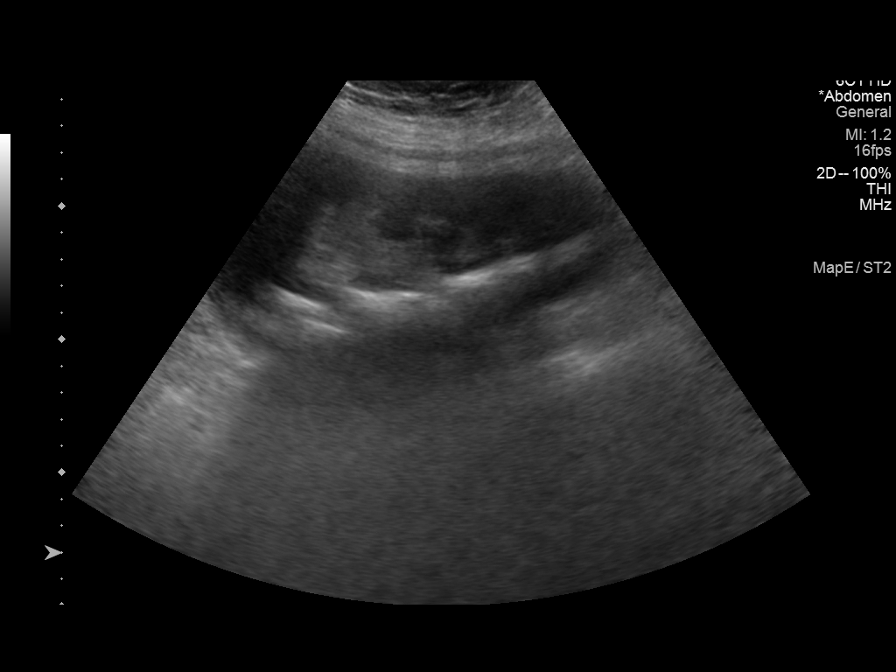

[9 of 9 positions shown; findings below may reference images not displayed]

FINDINGS: Sonographic interrogation of the 4 quadrants of the abdomen
demonstrates only a small volume ascites in the left hemiabdomen.
The fluid is deemed insufficient for paracentesis.
IMPRESSION: Small volume ascites, insufficient for paracentesis.
